# Patient Record
Sex: Male | Born: 1940 | Race: White | Hispanic: No | Marital: Married | Smoking: Former smoker
Health system: Southern US, Community
[De-identification: ages and names within clinical notes are randomized; demographics above are authoritative.]

## PROBLEM LIST (undated history)

## (undated) DIAGNOSIS — E039 Hypothyroidism, unspecified: Secondary | ICD-10-CM

## (undated) DIAGNOSIS — E785 Hyperlipidemia, unspecified: Secondary | ICD-10-CM

## (undated) DIAGNOSIS — J309 Allergic rhinitis, unspecified: Secondary | ICD-10-CM

## (undated) DIAGNOSIS — K08109 Complete loss of teeth, unspecified cause, unspecified class: Secondary | ICD-10-CM

## (undated) DIAGNOSIS — J45909 Unspecified asthma, uncomplicated: Secondary | ICD-10-CM

## (undated) DIAGNOSIS — M62838 Other muscle spasm: Secondary | ICD-10-CM

## (undated) DIAGNOSIS — M810 Age-related osteoporosis without current pathological fracture: Secondary | ICD-10-CM

## (undated) DIAGNOSIS — K5790 Diverticulosis of intestine, part unspecified, without perforation or abscess without bleeding: Secondary | ICD-10-CM

## (undated) DIAGNOSIS — Z972 Presence of dental prosthetic device (complete) (partial): Secondary | ICD-10-CM

## (undated) HISTORY — DX: Hyperlipidemia, unspecified: E78.5

## (undated) HISTORY — PX: PARATHYROIDECTOMY: SHX19

## (undated) HISTORY — PX: THYROID SURGERY: SHX805

## (undated) HISTORY — PX: COLONOSCOPY: SHX5424

---

## 2010-01-30 ENCOUNTER — Ambulatory Visit: Payer: Self-pay | Admitting: Unknown Physician Specialty

## 2010-01-31 LAB — PATHOLOGY REPORT

## 2011-03-26 ENCOUNTER — Ambulatory Visit: Payer: Self-pay | Admitting: Ophthalmology

## 2013-05-16 ENCOUNTER — Ambulatory Visit: Payer: Self-pay | Admitting: Unknown Physician Specialty

## 2013-05-17 LAB — PATHOLOGY REPORT

## 2013-11-14 ENCOUNTER — Ambulatory Visit: Payer: Self-pay | Admitting: Family Medicine

## 2014-05-30 DIAGNOSIS — M810 Age-related osteoporosis without current pathological fracture: Secondary | ICD-10-CM | POA: Diagnosis not present

## 2014-05-30 DIAGNOSIS — E039 Hypothyroidism, unspecified: Secondary | ICD-10-CM | POA: Diagnosis not present

## 2014-05-30 DIAGNOSIS — L219 Seborrheic dermatitis, unspecified: Secondary | ICD-10-CM | POA: Diagnosis not present

## 2014-05-30 DIAGNOSIS — J4520 Mild intermittent asthma, uncomplicated: Secondary | ICD-10-CM | POA: Diagnosis not present

## 2014-05-30 DIAGNOSIS — J302 Other seasonal allergic rhinitis: Secondary | ICD-10-CM | POA: Diagnosis not present

## 2014-05-30 DIAGNOSIS — Z23 Encounter for immunization: Secondary | ICD-10-CM | POA: Diagnosis not present

## 2014-06-01 DIAGNOSIS — M810 Age-related osteoporosis without current pathological fracture: Secondary | ICD-10-CM | POA: Diagnosis not present

## 2014-06-01 DIAGNOSIS — E039 Hypothyroidism, unspecified: Secondary | ICD-10-CM | POA: Diagnosis not present

## 2014-09-05 DIAGNOSIS — H2512 Age-related nuclear cataract, left eye: Secondary | ICD-10-CM | POA: Diagnosis not present

## 2014-09-11 DIAGNOSIS — H2512 Age-related nuclear cataract, left eye: Secondary | ICD-10-CM | POA: Diagnosis not present

## 2014-09-14 ENCOUNTER — Encounter: Payer: Self-pay | Admitting: *Deleted

## 2014-09-20 ENCOUNTER — Ambulatory Visit: Payer: Medicare Other | Admitting: Anesthesiology

## 2014-09-20 ENCOUNTER — Encounter
Admission: RE | Disposition: A | Discharge status: Home / Self Care | Payer: Self-pay | Source: Ambulatory Visit | Attending: Ophthalmology

## 2014-09-20 ENCOUNTER — Ambulatory Visit
Admission: RE | Admit: 2014-09-20 | Discharge: 2014-09-20 | Disposition: A | Discharge status: Home / Self Care | Payer: Medicare Other | Source: Ambulatory Visit | Attending: Ophthalmology | Admitting: Ophthalmology

## 2014-09-20 DIAGNOSIS — E890 Postprocedural hypothyroidism: Secondary | ICD-10-CM | POA: Diagnosis not present

## 2014-09-20 DIAGNOSIS — M810 Age-related osteoporosis without current pathological fracture: Secondary | ICD-10-CM | POA: Insufficient documentation

## 2014-09-20 DIAGNOSIS — J45909 Unspecified asthma, uncomplicated: Secondary | ICD-10-CM | POA: Diagnosis not present

## 2014-09-20 DIAGNOSIS — Z79899 Other long term (current) drug therapy: Secondary | ICD-10-CM | POA: Insufficient documentation

## 2014-09-20 DIAGNOSIS — H2512 Age-related nuclear cataract, left eye: Secondary | ICD-10-CM | POA: Insufficient documentation

## 2014-09-20 DIAGNOSIS — H9190 Unspecified hearing loss, unspecified ear: Secondary | ICD-10-CM | POA: Insufficient documentation

## 2014-09-20 DIAGNOSIS — H269 Unspecified cataract: Secondary | ICD-10-CM | POA: Diagnosis present

## 2014-09-20 DIAGNOSIS — F17200 Nicotine dependence, unspecified, uncomplicated: Secondary | ICD-10-CM | POA: Insufficient documentation

## 2014-09-20 DIAGNOSIS — Z9889 Other specified postprocedural states: Secondary | ICD-10-CM | POA: Insufficient documentation

## 2014-09-20 HISTORY — DX: Unspecified asthma, uncomplicated: J45.909

## 2014-09-20 HISTORY — DX: Allergic rhinitis, unspecified: J30.9

## 2014-09-20 HISTORY — DX: Complete loss of teeth, unspecified cause, unspecified class: K08.109

## 2014-09-20 HISTORY — PX: CATARACT EXTRACTION W/PHACO: SHX586

## 2014-09-20 HISTORY — DX: Diverticulosis of intestine, part unspecified, without perforation or abscess without bleeding: K57.90

## 2014-09-20 HISTORY — DX: Other muscle spasm: M62.838

## 2014-09-20 HISTORY — DX: Hypothyroidism, unspecified: E03.9

## 2014-09-20 HISTORY — DX: Presence of dental prosthetic device (complete) (partial): Z97.2

## 2014-09-20 HISTORY — DX: Age-related osteoporosis without current pathological fracture: M81.0

## 2014-09-20 SURGERY — PHACOEMULSIFICATION, CATARACT, WITH IOL INSERTION
Anesthesia: Monitor Anesthesia Care | Laterality: Left | Wound class: Clean

## 2014-09-20 MED ORDER — POVIDONE-IODINE 5 % OP SOLN
1.0000 "application " | Freq: Once | OPHTHALMIC | Status: AC
  Administered 2014-09-20: 1 via OPHTHALMIC

## 2014-09-20 MED ORDER — BSS IO SOLN
INTRAOCULAR | Status: DC | PRN
  Administered 2014-09-20: 15 mL
  Administered 2014-09-20: 103 mL

## 2014-09-20 MED ORDER — BRIMONIDINE TARTRATE 0.2 % OP SOLN
OPHTHALMIC | Status: DC | PRN
  Administered 2014-09-20: 3 [drp] via OPHTHALMIC

## 2014-09-20 MED ORDER — FENTANYL CITRATE (PF) 100 MCG/2ML IJ SOLN
INTRAMUSCULAR | Status: DC | PRN
  Administered 2014-09-20: 50 ug via INTRAVENOUS

## 2014-09-20 MED ORDER — LIDOCAINE HCL (PF) 4 % IJ SOLN: INTRAMUSCULAR | Status: DC | PRN

## 2014-09-20 MED ORDER — EPINEPHRINE HCL 1 MG/ML IJ SOLN
INTRAMUSCULAR | Status: DC | PRN
  Administered 2014-09-20: 1 mL

## 2014-09-20 MED ORDER — NA HYALUR & NA CHOND-NA HYALUR 0.4-0.35 ML IO KIT
PACK | INTRAOCULAR | Status: DC | PRN
  Administered 2014-09-20: 1 mL via INTRAOCULAR

## 2014-09-20 MED ORDER — MIDAZOLAM HCL 2 MG/2ML IJ SOLN
INTRAMUSCULAR | Status: DC | PRN
Start: 2014-09-20 — End: 2014-09-20
  Administered 2014-09-20: 2 mg via INTRAVENOUS

## 2014-09-20 MED ORDER — TETRACAINE HCL 0.5 % OP SOLN
1.0000 [drp] | OPHTHALMIC | Status: DC | PRN
  Administered 2014-09-20: 1 [drp] via OPHTHALMIC

## 2014-09-20 MED ORDER — CEFUROXIME OPHTHALMIC INJECTION 1 MG/0.1 ML
INJECTION | OPHTHALMIC | Status: DC | PRN
  Administered 2014-09-20: .3 mL via OPHTHALMIC

## 2014-09-20 MED ORDER — TIMOLOL MALEATE 0.5 % OP SOLN
OPHTHALMIC | Status: DC | PRN
  Administered 2014-09-20: 3 [drp] via OPHTHALMIC

## 2014-09-20 MED ORDER — ARMC OPHTHALMIC DILATING GEL
1.0000 "application " | OPHTHALMIC | Status: DC | PRN
  Administered 2014-09-20 (×2): 1 via OPHTHALMIC

## 2014-09-20 SURGICAL SUPPLY — 25 items
CANNULA ANT/CHMB 27GA (MISCELLANEOUS) ×2 IMPLANT
GLOVE SURG LX 7.5 STRW (GLOVE) ×1
GLOVE SURG LX STRL 7.5 STRW (GLOVE) ×1 IMPLANT
GLOVE SURG TRIUMPH 8.0 PF LTX (GLOVE) ×2 IMPLANT
GOWN STRL REUS W/ TWL LRG LVL3 (GOWN DISPOSABLE) ×2 IMPLANT
GOWN STRL REUS W/TWL LRG LVL3 (GOWN DISPOSABLE) ×2
LENS IOL ACRSF IQ PC 19.0 (Intraocular Lens) ×1 IMPLANT
LENS IOL ACRYSOF IQ POST 19.0 (Intraocular Lens) ×2 IMPLANT
MARKER SKIN SURG W/RULER VIO (MISCELLANEOUS) ×2 IMPLANT
NDL RETROBULBAR .5 NSTRL (NEEDLE) IMPLANT
NEEDLE FILTER BLUNT 18X 1/2SAF (NEEDLE) ×1
NEEDLE FILTER BLUNT 18X1 1/2 (NEEDLE) ×1 IMPLANT
PACK CATARACT BRASINGTON (MISCELLANEOUS) ×2 IMPLANT
PACK EYE AFTER SURG (MISCELLANEOUS) ×2 IMPLANT
PACK OPTHALMIC (MISCELLANEOUS) ×2 IMPLANT
RING MALYGIN 7.0 (MISCELLANEOUS) IMPLANT
SUT ETHILON 10-0 CS-B-6CS-B-6 (SUTURE)
SUT VICRYL  9 0 (SUTURE)
SUT VICRYL 9 0 (SUTURE) IMPLANT
SUTURE EHLN 10-0 CS-B-6CS-B-6 (SUTURE) IMPLANT
SYR 3ML LL SCALE MARK (SYRINGE) ×2 IMPLANT
SYR 5ML LL (SYRINGE) IMPLANT
SYR TB 1ML LUER SLIP (SYRINGE) ×2 IMPLANT
WATER STERILE IRR 500ML POUR (IV SOLUTION) ×2 IMPLANT
WIPE NON LINTING 3.25X3.25 (MISCELLANEOUS) ×2 IMPLANT

## 2014-09-20 NOTE — Anesthesia Preprocedure Evaluation (Signed)
Anesthesia Evaluation  Patient identified by MRN, date of birth, ID band Patient awake    Reviewed: Allergy & Precautions, H&P , Patient's Chart, lab work & pertinent test results  History of Anesthesia Complications Negative for: history of anesthetic complications  Airway Mallampati: II  TM Distance: >3 FB Neck ROM: full    Dental  (+) Edentulous Upper, Edentulous Lower   Pulmonary asthma , Current Smoker,    Pulmonary exam normal       Cardiovascular negative cardio ROS Normal cardiovascular exam    Neuro/Psych negative neurological ROS     GI/Hepatic negative GI ROS, Neg liver ROS,   Endo/Other  Hypothyroidism   Renal/GU negative Renal ROS  negative genitourinary   Musculoskeletal   Abdominal   Peds negative pediatric ROS (+)  Hematology negative hematology ROS (+)   Anesthesia Other Findings   Reproductive/Obstetrics                             Anesthesia Physical Anesthesia Plan  ASA: II  Anesthesia Plan: MAC   Post-op Pain Management:    Induction:   Airway Management Planned:   Additional Equipment:   Intra-op Plan:   Post-operative Plan:   Informed Consent: I have reviewed the patients History and Physical, chart, labs and discussed the procedure including the risks, benefits and alternatives for the proposed anesthesia with the patient or authorized representative who has indicated his/her understanding and acceptance.     Plan Discussed with: CRNA  Anesthesia Plan Comments:         Anesthesia Quick Evaluation

## 2014-09-20 NOTE — Discharge Instructions (Signed)
Follow-Up Appointment is: May 12 @ 9:40 am   Cataract Surgery Care After Refer to this sheet in the next few weeks. These instructions provide you with information on caring for yourself after your procedure. Your caregiver may also give you more specific instructions. Your treatment has been planned according to current medical practices, but problems sometimes occur. Call your caregiver if you have any problems or questions after your procedure.  HOME CARE INSTRUCTIONS   Avoid strenuous activities as directed by your caregiver.  Ask your caregiver when you can resume driving.  Use eyedrops or other medicines to help healing and control pressure inside your eye as directed by your caregiver.  Only take over-the-counter or prescription medicines for pain, discomfort, or fever as directed by your caregiver.  Do not to touch or rub your eyes.  You may be instructed to use a protective shield during the first few days and nights after surgery. If not, wear sunglasses to protect your eyes. This is to protect the eye from pressure or from being accidentally bumped.  Keep the area around your eye clean and dry. Avoid swimming or allowing water to hit you directly in the face while showering. Keep soap and shampoo out of your eyes.  Do not bend or lift heavy objects. Bending increases pressure in the eye. You can walk, climb stairs, and do light household chores.  Do not put a contact lens into the eye that had surgery until your caregiver says it is okay to do so.  Ask your doctor when you can return to work. This will depend on the kind of work that you do. If you work in a dusty environment, you may be advised to wear protective eyewear for a period of time.  Ask your caregiver when it will be safe to engage in sexual activity.  Continue with your regular eye exams as directed by your caregiver. What to expect:  It is normal to feel itching and mild discomfort for a few days after  cataract surgery. Some fluid discharge is also common, and your eye may be sensitive to light and touch.  After 1 to 2 days, even moderate discomfort should disappear. In most cases, healing will take about 6 weeks.  If you received an intraocular lens (IOL), you may notice that colors are very bright or have a blue tinge. Also, if you have been in bright sunlight, everything may appear reddish for a few hours. If you see these color tinges, it is because your lens is clear and no longer cloudy. Within a few months after receiving an IOL, these extra colors should go away. When you have healed, you will probably need new glasses. SEEK MEDICAL CARE IF:   You have increased bruising around your eye.  You have discomfort not helped by medicine. SEEK IMMEDIATE MEDICAL CARE IF:   You have a fever.  You have a worsening or sudden vision loss.  You have redness, swelling, or increasing pain in the eye.  You have a thick discharge from the eye that had surgery. MAKE SURE YOU:  Understand these instructions.  Will watch your condition.  Will get help right away if you are not doing well or get worse. Document Released: 11/15/2004 Document Revised: 07/21/2011 Document Reviewed: 12/20/2010 [facility] Patient Information 2015 [place], [place]. This information is not intended to replace advice given to you by your health care provider. Make sure you discuss any questions you have with your health care provider.   General  Anesthesia, Care After Refer to this sheet in the next few weeks. These instructions provide you with information on caring for yourself after your procedure. Your health care provider may also give you more specific instructions. Your treatment has been planned according to current medical practices, but problems sometimes occur. Call your health care provider if you have any problems or questions after your procedure. WHAT TO EXPECT AFTER THE PROCEDURE After the procedure, it is  typical to experience:  Sleepiness.  Nausea and vomiting. HOME CARE INSTRUCTIONS  For the first 24 hours after general anesthesia:  Have a responsible person with you.  Do not drive a car. If you are alone, do not take public transportation.  Do not drink alcohol.  Do not take medicine that has not been prescribed by your health care provider.  Do not sign important papers or make important decisions.  You may resume a normal diet and activities as directed by your health care provider.  Change bandages (dressings) as directed.  If you have questions or problems that seem related to general anesthesia, call the hospital and ask for the anesthetist or anesthesiologist on call. SEEK MEDICAL CARE IF:  You have nausea and vomiting that continue the day after anesthesia.  You develop a rash. SEEK IMMEDIATE MEDICAL CARE IF:   You have difficulty breathing.  You have chest pain.  You have any allergic problems. Document Released: 08/04/2000 Document Revised: 05/03/2013 Document Reviewed: 11/11/2012 [facility] Patient Information 2015 [place], [place]. This information is not intended to replace advice given to you by your health care provider. Make sure you discuss any questions you have with your health care provider.

## 2014-09-20 NOTE — H&P (Signed)
  The History and Physical notes were scanned in.  The patient remains stable and unchanged from the H&P.   Previous H&P reviewed, patient examined, and there are no changes.  Burdett Pinzon 09/20/2014 9:08 AM

## 2014-09-20 NOTE — Transfer of Care (Signed)
Immediate Anesthesia Transfer of Care Note  Patient: Blake Bates  Procedure(s) Performed: Procedure(s): CATARACT EXTRACTION PHACO AND INTRAOCULAR LENS PLACEMENT (IOC) (Left)  Patient Location: PACU  Anesthesia Type: MAC  Level of Consciousness: awake, alert  and patient cooperative  Airway and Oxygen Therapy: Patient Spontanous Breathing and Patient connected to supplemental oxygen  Post-op Assessment: Post-op Vital signs reviewed, Patient's Cardiovascular Status Stable, Respiratory Function Stable, Patent Airway and No signs of Nausea or vomiting  Post-op Vital Signs: Reviewed and stable  Complications: No apparent anesthesia complications

## 2014-09-20 NOTE — Anesthesia Postprocedure Evaluation (Signed)
  Anesthesia Post-op Note  Patient: Blake Bates  Procedure(s) Performed: Procedure(s): CATARACT EXTRACTION PHACO AND INTRAOCULAR LENS PLACEMENT (IOC) (Left)  Anesthesia type:MAC  Patient location: PACU  Post pain: Pain level controlled  Post assessment: Post-op Vital signs reviewed, Patient's Cardiovascular Status Stable, Respiratory Function Stable, Patent Airway and No signs of Nausea or vomiting  Post vital signs: Reviewed and stable  Last Vitals:  Filed Vitals:   09/20/14 0943  BP: 142/73  Pulse: 65  Temp: 36.3 C  Resp: 14    Level of consciousness: awake, alert  and patient cooperative  Complications: No apparent anesthesia complications

## 2014-09-20 NOTE — Op Note (Signed)
OPERATIVE NOTE  CLESTER CHLEBOWSKI [id number] 09/20/2014   PREOPERATIVE DIAGNOSIS:     Nuclear sclerotic cataract left eye. H25.12   POSTOPERATIVE DIAGNOSIS:     Nuclear sclerotic cataract left eye.     PROCEDURE:  Phacoemusification with posterior chamber intraocular lens placement of the left eye   LENS:   Implant Name Type Inv. Item Serial No. Manufacturer Lot No. LRB No. Used  IMPLANT LENS - [id number] Intraocular Lens IMPLANT LENS [id number] ALCON   Left 1     [id number] 20.0 PCIOL   ULTRASOUND TIME: 16 of 1 minutes 45 seconds, CDE 16.6  SURGEON:  Wyonia Hough, MD   ANESTHESIA:  Topical with tetracaine drops and 2% Xylocaine jelly.   COMPLICATIONS:  None.   DESCRIPTION OF PROCEDURE:  The patient was identified in the holding room and transported to the operating room and placed in the supine position under the operating microscope.  The left eye was identified as the operative eye and it was prepped and draped in the usual sterile ophthalmic fashion.   A 1 millimeter clear-corneal paracentesis was made at the 1:30 position.  The anterior chamber was filled with Viscoat viscoelastic.  A 2.4 millimeter keratome was used to make a near-clear corneal incision at the 10:30 position.  .  A curvilinear capsulorrhexis was made with a cystotome and capsulorrhexis forceps.  Balanced salt solution was used to hydrodissect and hydrodelineate the nucleus.   Phacoemulsification was then used in stop and chop fashion to remove the lens nucleus and epinucleus.  The remaining cortex was then removed using the irrigation and aspiration handpiece. Provisc was then placed into the capsular bag to distend it for lens placement.  A 20.0 -diopter lens was then injected into the capsular bag.  The remaining viscoelastic was aspirated.   Wounds were hydrated with balanced salt solution.  The anterior chamber was inflated to a physiologic pressure with balanced salt solution.  No wound leaks were  noted. Cefuroxime 0.1 ml of a 10mg /ml solution was injected into the anterior chamber for a dose of 1 mg of intracameral antibiotic at the completion of the case.   Timolol and Brimonidine drops were applied to the eye.  The patient was taken to the recovery room in stable condition without complications of anesthesia or surgery.  Jamarrius Salay 09/20/2014, 9:43 AM

## 2014-09-21 ENCOUNTER — Encounter: Payer: Self-pay | Admitting: Ophthalmology

## 2014-10-30 DIAGNOSIS — Z961 Presence of intraocular lens: Secondary | ICD-10-CM | POA: Diagnosis not present

## 2014-11-03 ENCOUNTER — Encounter: Payer: Self-pay | Admitting: Family Medicine

## 2014-11-03 ENCOUNTER — Ambulatory Visit (INDEPENDENT_AMBULATORY_CARE_PROVIDER_SITE_OTHER): Payer: Medicare Other | Admitting: Family Medicine

## 2014-11-03 VITALS — BP 135/70 | HR 68 | Temp 97.6°F | Resp 16 | Ht 70.0 in | Wt 151.8 lb

## 2014-11-03 DIAGNOSIS — L989 Disorder of the skin and subcutaneous tissue, unspecified: Secondary | ICD-10-CM

## 2014-11-03 DIAGNOSIS — J4520 Mild intermittent asthma, uncomplicated: Secondary | ICD-10-CM

## 2014-11-03 DIAGNOSIS — E890 Postprocedural hypothyroidism: Secondary | ICD-10-CM | POA: Diagnosis not present

## 2014-11-03 DIAGNOSIS — Z0000 Encounter for general adult medical examination without abnormal findings: Secondary | ICD-10-CM | POA: Diagnosis not present

## 2014-11-03 DIAGNOSIS — J45909 Unspecified asthma, uncomplicated: Secondary | ICD-10-CM | POA: Insufficient documentation

## 2014-11-03 DIAGNOSIS — M810 Age-related osteoporosis without current pathological fracture: Secondary | ICD-10-CM | POA: Insufficient documentation

## 2014-11-03 DIAGNOSIS — IMO0002 Reserved for concepts with insufficient information to code with codable children: Secondary | ICD-10-CM

## 2014-11-03 DIAGNOSIS — M6530 Trigger finger, unspecified finger: Secondary | ICD-10-CM

## 2014-11-03 DIAGNOSIS — E039 Hypothyroidism, unspecified: Secondary | ICD-10-CM | POA: Insufficient documentation

## 2014-11-03 MED ORDER — LEVOTHYROXINE SODIUM 100 MCG PO TABS: 100.0000 ug | ORAL_TABLET | Freq: Every day | ORAL | Status: DC

## 2014-11-03 NOTE — Progress Notes (Signed)
Name: Blake Bates   MRN: [id number]    DOB: April 14, 1941   Date:11/03/2014       Progress Note  Subjective  Chief Complaint  Chief Complaint  Patient presents with  . Annual Exam    HPI  Here for annual physical for health maintenance. Hx. Of hypothyhoid, asthma, osteoporsis.  Feels well.  No c/o  Past Medical History  Diagnosis Date  . Asthma   . Osteoporosis   . Hypothyroidism     s/p subtotal thyroidectomy  . Rhinitis, allergic     pollens, mold, animal dander  . Muscle spasms of lower extremity     Right upper leg  . Diverticulosis   . Full dentures     Past Surgical History  Procedure Laterality Date  . Thyroid surgery      subtotal  . Parathyroidectomy    . Colonoscopy    . Cataract extraction w/phaco Left 09/20/2014    Procedure: CATARACT EXTRACTION PHACO AND INTRAOCULAR LENS PLACEMENT (IOC);  Surgeon: Leandrew Koyanagi, MD;  Location: [facility];  Service: Ophthalmology;  Laterality: Left;    History reviewed. No pertinent family history.  History   Social History  . Marital Status: Married    Spouse Name: N/A  . Number of Children: N/A  . Years of Education: N/A   Occupational History  . Not on file.   Social History Main Topics  . Smoking status: Current Every Day Smoker -- 3.00 packs/day    Types: Pipe, Cigarettes  . Smokeless tobacco: Never Used  . Alcohol Use: 1.2 oz/week    2 Glasses of wine per week  . Drug Use: No  . Sexual Activity: Not on file   Other Topics Concern  . Not on file   Social History Narrative     Current outpatient prescriptions:  .  albuterol (PROVENTIL HFA;VENTOLIN HFA) 108 (90 BASE) MCG/ACT inhaler, Inhale into the lungs every 6 (six) hours as needed for wheezing or shortness of breath., Disp: , Rfl:  .  Calcium Carbonate-Vitamin D (CALCIUM + D PO), Take by mouth daily. PM, Disp: , Rfl:  .  Cholecalciferol (VITAMIN D-3 PO), Take by mouth daily. PM, Disp: , Rfl:  .  levothyroxine (SYNTHROID,  LEVOTHROID) 100 MCG tablet, Take 1 tablet (100 mcg total) by mouth daily before breakfast., Disp: 90 tablet, Rfl: 3 .  loratadine (CLARITIN) 10 MG tablet, Take 10 mg by mouth daily as needed for allergies. PM, Disp: , Rfl:   Allergies  Allergen Reactions  . Demerol [Meperidine] Other (See Comments)    Combative, altered mental status     Review of Systems  Constitutional: Negative.  Negative for fever, chills, weight loss and malaise/fatigue.  HENT: Negative.  Negative for congestion.   Eyes: Negative.  Negative for blurred vision and double vision.  Respiratory: Negative.  Negative for cough, sputum production, shortness of breath and wheezing.   Cardiovascular: Negative.  Negative for chest pain, palpitations, orthopnea, claudication and leg swelling.  Gastrointestinal: Negative.  Negative for heartburn, nausea, vomiting, abdominal pain, diarrhea and blood in stool.  Genitourinary: Negative.  Negative for dysuria, urgency and frequency.  Musculoskeletal: Negative for joint pain. Myalgias: r. HIP.  Skin: Positive for rash (seborrhea).  Neurological: Negative.  Negative for dizziness, sensory change, focal weakness, weakness and headaches.  Psychiatric/Behavioral: Negative.  Negative for depression. The patient is not nervous/anxious.       Objective  Filed Vitals:   11/03/14 1040 11/03/14 1118  BP: 145/80 135/70  Pulse:  68   Temp: 97.6 F (36.4 C)   Resp: 16   Height: 5\' 10"  (1.778 m)   Weight: 151 lb 12.8 oz (68.856 kg)     Physical Exam  Constitutional: He is oriented to person, place, and time and well-developed, well-nourished, and in no distress. No distress.  HENT:  Head: Normocephalic and atraumatic.  Right Ear: External ear normal.  Left Ear: External ear normal.  Nose: Nose normal.  Mouth/Throat: Oropharynx is clear and moist.  Wears bilateral hearing aids  Eyes: Conjunctivae and EOM are normal. Pupils are equal, round, and reactive to light. Right eye  exhibits no discharge. Left eye exhibits no discharge. No scleral icterus.  Neck: Normal range of motion. Neck supple. No thyromegaly present.  Cardiovascular: Normal rate, regular rhythm, normal heart sounds and intact distal pulses.  Exam reveals no gallop and no friction rub.   No murmur heard. Pulmonary/Chest: Effort normal and breath sounds normal. No respiratory distress. He has no wheezes. He has no rales. He exhibits no tenderness.  Abdominal: Bowel sounds are normal. He exhibits no distension and no mass. There is no tenderness. There is no rebound and no guarding.  Genitourinary: Penis normal.  Testes normal bilaterally.  Neg. Inguinal hernias.  Musculoskeletal:  Has R. 4th finger trigger finger and L. Thumb trigger finger.  Lymphadenopathy:    He has no cervical adenopathy.  Neurological: He is alert and oriented to person, place, and time. No cranial nerve deficit.  Skin: Skin is warm and dry.  Irregular skin lesion mid chest.  Ow[place] Shark, not raised.  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  Psychiatric: Mood, affect and judgment normal.          Assessment & Plan  Problem List Items Addressed This Visit      Respiratory   Asthma   Relevant Orders   CBC with Differential     Endocrine   Hypothyroidism   Relevant Medications   levothyroxine (SYNTHROID, LEVOTHROID) 100 MCG tablet   Other Relevant Orders   TSH     Musculoskeletal and Integument   Osteoporosis   Relevant Orders   Comprehensive Metabolic Panel (CMET)   Skin lesion of chest wall   Relevant Orders   Ambulatory referral to Dermatology   Trigger finger of both hands   Relevant Orders   Ambulatory referral to Orthopedic Surgery    Other Visit Diagnoses    Preventative health care    -  Primary    Relevant Orders    PR TOBACCO COUNSELING      1. Preventative health care  - PR TOBACCO COUNSELING  2. Postoperative hypothyroidism  - TSH - levothyroxine (SYNTHROID, LEVOTHROID) 100 MCG  tablet; Take 1 tablet (100 mcg total) by mouth daily before breakfast.  Dispense: 90 tablet; Refill: 3  3. Asthma, mild intermittent, uncomplicated  - CBC with Differential  4. Osteoporosis  - Comprehensive Metabolic Panel (CMET)  5. Skin lesion of chest wall  - Ambulatory referral to Dermatology  6.Trigger finger   - Ambulatory referral to Orthopedic Surgery

## 2014-11-15 ENCOUNTER — Telehealth: Payer: Self-pay

## 2014-11-15 NOTE — Telephone Encounter (Signed)
12/18/2014 [place] 11:45 w Hardie Pulley Patient notified. [facility]

## 2014-11-21 DIAGNOSIS — M65341 Trigger finger, right ring finger: Secondary | ICD-10-CM | POA: Diagnosis not present

## 2014-11-21 DIAGNOSIS — M65312 Trigger thumb, left thumb: Secondary | ICD-10-CM | POA: Diagnosis not present

## 2014-12-18 DIAGNOSIS — L218 Other seborrheic dermatitis: Secondary | ICD-10-CM | POA: Diagnosis not present

## 2014-12-18 DIAGNOSIS — L821 Other seborrheic keratosis: Secondary | ICD-10-CM | POA: Diagnosis not present

## 2015-05-29 ENCOUNTER — Encounter: Payer: Self-pay | Admitting: Family Medicine

## 2015-05-29 ENCOUNTER — Ambulatory Visit (INDEPENDENT_AMBULATORY_CARE_PROVIDER_SITE_OTHER): Payer: Medicare Other | Admitting: Family Medicine

## 2015-05-29 VITALS — BP 129/78 | HR 66 | Resp 16 | Ht 70.0 in | Wt 149.0 lb

## 2015-05-29 DIAGNOSIS — Z23 Encounter for immunization: Secondary | ICD-10-CM | POA: Diagnosis not present

## 2015-05-29 NOTE — Patient Instructions (Signed)
Health Maintenance  Topic Date Due  . INFLUENZA VACCINE  12/11/2015  . TETANUS/TDAP  10/13/2023  . COLONOSCOPY  11/10/2023  . ZOSTAVAX  Completed  . PNA vac Low Risk Adult  Completed

## 2015-05-29 NOTE — Progress Notes (Signed)
Patient: Blake Bates, Male    DOB: 22-Apr-1941, 75 y.o.   MRN: [id number] Visit Date: 05/29/2015  Today's Provider: Dicky Doe, MD   Chief Complaint  Patient presents with  . Medicare Wellness    Subjective:    Annual wellness visit Blake Bates is a 75 y.o. male who presents today for his Subsequent Annual Wellness Visit. He feels well. He reports exercising . He reports he is sleeping well.   ----------------------------------------------------------- HPI  Review of Systems  Social History   Social History  . Marital Status: Married    Spouse Name: N/A  . Number of Children: N/A  . Years of Education: N/A   Occupational History  . Not on file.   Social History Main Topics  . Smoking status: Current Every Day Smoker -- 0.00 packs/day    Types: Pipe  . Smokeless tobacco: Never Used  . Alcohol Use: 1.2 oz/week    2 Glasses of wine per week  . Drug Use: No  . Sexual Activity: Not on file   Other Topics Concern  . Not on file   Social History Narrative    Patient Active Problem List   Diagnosis Date Noted  . Hypothyroidism 11/03/2014  . Asthma 11/03/2014  . Osteoporosis 11/03/2014  . Skin lesion of chest wall 11/03/2014  . Trigger finger of both hands 11/03/2014    Past Surgical History  Procedure Laterality Date  . Thyroid surgery      subtotal  . Parathyroidectomy    . Colonoscopy    . Cataract extraction w/phaco Left 09/20/2014    Procedure: CATARACT EXTRACTION PHACO AND INTRAOCULAR LENS PLACEMENT (IOC);  Surgeon: Leandrew Koyanagi, MD;  Location: [facility];  Service: Ophthalmology;  Laterality: Left;    His family history includes Cancer (age of onset: 7) in his father; Stroke (age of onset: 61) in his mother.    Previous Medications   ALBUTEROL (PROVENTIL HFA;VENTOLIN HFA) 108 (90 BASE) MCG/ACT INHALER    Inhale into the lungs every 6 (six) hours as needed for wheezing or shortness of breath.   CALCIUM  CARBONATE-VITAMIN D (CALCIUM + D PO)    Take by mouth daily. PM   CHOLECALCIFEROL (VITAMIN D-3 PO)    Take by mouth daily. PM   LEVOTHYROXINE (SYNTHROID, LEVOTHROID) 100 MCG TABLET    Take 1 tablet (100 mcg total) by mouth daily before breakfast.   LORATADINE (CLARITIN) 10 MG TABLET    Take 10 mg by mouth daily as needed for allergies. PM    Patient Care Team: Arlis Porta., MD as PCP - General (Family Medicine)     Objective:   Vitals: BP 129/78 mmHg  Pulse 66  Resp 16  Ht 5\' 10"  (1.778 m)  Wt 149 lb (67.586 kg)  BMI 21.38 kg/m2  Physical Exam  Activities of Daily Living In your present state of health, do you have any difficulty performing the following activities: 05/29/2015 11/03/2014  Hearing? N Y  Vision? N Y  Difficulty concentrating or making decisions? N N  Walking or climbing stairs? N N  Dressing or bathing? N N  Doing errands, shopping? N N  Preparing Food and eating ? N -  Using the Toilet? N -  In the past six months, have you accidently leaked urine? N -  Do you have problems with loss of bowel control? N -  Managing your Medications? N -  Managing your Finances? N -  Housekeeping or managing your Housekeeping?  N -    Fall Risk Assessment Fall Risk  05/29/2015 11/03/2014  Falls in the past year? No No     Patient reports there are not safety devices in place in shower at home.   Depression Screen PHQ 2/9 Scores 05/29/2015 11/03/2014  PHQ - 2 Score 0 0     MMSE MMSE - Mini Mental State Exam 05/29/2015  Orientation to time 5  Orientation to Place 5  Registration 3  Attention/ Calculation 5  Recall 3  Language- name 2 objects 2  Language- repeat 1  Language- follow 3 step command 3  Language- read & follow direction 1  Write a sentence 1  Copy design 1  Total score 30     Assessment & Plan:     Annual Wellness Visit  Reviewed patient's Family Medical History Reviewed and updated list of patient's medical providers Assessment of  cognitive impairment was done Assessed patient's functional ability Established a written schedule for health screening [place] Completed and Reviewed  Exercise Activities and Dietary recommendations Goals    . Exercise 3x per week (30 min per time)     Trouble finding place to walk.        Immunization History  Administered Date(s) Administered  . Influenza, High Dose Seasonal PF 05/29/2015  . Pneumococcal Conjugate-13 05/30/2014  . Pneumococcal Polysaccharide-23 04/30/2012  . Tdap 10/12/2013  . Zoster 04/11/2012    Health Maintenance  Topic Date Due  . INFLUENZA VACCINE  12/11/2015  . TETANUS/TDAP  10/13/2023  . COLONOSCOPY  11/10/2023  . ZOSTAVAX  Completed  . PNA vac Low Risk Adult  Completed     Discussed health benefits of physical activity, and encouraged him to engage in regular exercise appropriate for his age and condition.    ------------------------------------------------------------------------------------------------------------   Problem List Items Addressed This Visit    None    Visit Diagnoses    Need for influenza vaccination    -  Primary    Relevant Orders    Flu vaccine HIGH DOSE PF (Fluzone High dose) (Completed)        Larene Beach, MD [facility]  05/29/2015

## 2015-07-16 ENCOUNTER — Other Ambulatory Visit: Payer: Self-pay | Admitting: Family Medicine

## 2015-11-20 ENCOUNTER — Encounter: Payer: Self-pay | Admitting: Family Medicine

## 2015-11-20 ENCOUNTER — Ambulatory Visit (INDEPENDENT_AMBULATORY_CARE_PROVIDER_SITE_OTHER): Payer: Medicare Other | Admitting: Family Medicine

## 2015-11-20 VITALS — BP 119/76 | HR 58 | Temp 97.8°F | Resp 19 | Ht 70.0 in

## 2015-11-20 DIAGNOSIS — Z0000 Encounter for general adult medical examination without abnormal findings: Secondary | ICD-10-CM | POA: Diagnosis not present

## 2015-11-20 DIAGNOSIS — M810 Age-related osteoporosis without current pathological fracture: Secondary | ICD-10-CM

## 2015-11-20 DIAGNOSIS — E038 Other specified hypothyroidism: Secondary | ICD-10-CM | POA: Diagnosis not present

## 2015-11-20 DIAGNOSIS — E034 Atrophy of thyroid (acquired): Secondary | ICD-10-CM

## 2015-11-20 DIAGNOSIS — J4520 Mild intermittent asthma, uncomplicated: Secondary | ICD-10-CM

## 2015-11-20 LAB — CBC WITH DIFFERENTIAL/PLATELET
Basophils Absolute: 78 cells/uL (ref 0–200)
Basophils Relative: 1 %
Eosinophils Absolute: 234 cells/uL (ref 15–500)
Eosinophils Relative: 3 %
HCT: 45.1 % (ref 38.5–50.0)
Hemoglobin: 14.9 g/dL (ref 13.2–17.1)
Lymphocytes Relative: 19 %
Lymphs Abs: 1482 cells/uL (ref 850–3900)
MCH: 31 pg (ref 27.0–33.0)
MCHC: 33 g/dL (ref 32.0–36.0)
MCV: 93.8 fL (ref 80.0–100.0)
MPV: 9.9 fL (ref 7.5–12.5)
Monocytes Absolute: 858 cells/uL (ref 200–950)
Monocytes Relative: 11 %
Neutro Abs: 5148 cells/uL (ref 1500–7800)
Neutrophils Relative %: 66 %
Platelets: 277 10*3/uL (ref 140–400)
RBC: 4.81 MIL/uL (ref 4.20–5.80)
RDW: 14.1 % (ref 11.0–15.0)
WBC: 7.8 10*3/uL (ref 3.8–10.8)

## 2015-11-20 NOTE — Progress Notes (Signed)
Name: Blake Bates   MRN: [id number]    DOB: 09-15-1940   Date:11/20/2015       Progress Note  Subjective  Chief Complaint  Chief Complaint  Patient presents with  . Annual Exam    HPI Here for annual physical.  Has hypothyroidism.  Also with some asthma, but not bothered much with it.  Has some osteoporosis and takes Vit D and Calcium  No problem-specific assessment & plan notes found for this encounter.   Past Medical History  Diagnosis Date  . Asthma   . Osteoporosis   . Hypothyroidism     s/p subtotal thyroidectomy  . Rhinitis, allergic     pollens, mold, animal dander  . Muscle spasms of lower extremity     Right upper leg  . Diverticulosis   . Full dentures     Past Surgical History  Procedure Laterality Date  . Thyroid surgery      subtotal  . Parathyroidectomy    . Colonoscopy    . Cataract extraction w/phaco Left 09/20/2014    Procedure: CATARACT EXTRACTION PHACO AND INTRAOCULAR LENS PLACEMENT (IOC);  Surgeon: Leandrew Koyanagi, MD;  Location: [facility];  Service: Ophthalmology;  Laterality: Left;    Family History  Problem Relation Age of Onset  . Stroke Mother 42  . Cancer Father 2    Social History   Social History  . Marital Status: Married    Spouse Name: N/A  . Number of Children: N/A  . Years of Education: N/A   Occupational History  . Not on file.   Social History Main Topics  . Smoking status: Current Every Day Smoker -- 0.00 packs/day    Types: Pipe  . Smokeless tobacco: Never Used  . Alcohol Use: 1.2 oz/week    2 Glasses of wine per week  . Drug Use: No  . Sexual Activity: Not on file   Other Topics Concern  . Not on file   Social History Narrative     Current outpatient prescriptions:  .  albuterol (PROVENTIL HFA;VENTOLIN HFA) 108 (90 BASE) MCG/ACT inhaler, Inhale into the lungs every 6 (six) hours as needed for wheezing or shortness of breath., Disp: , Rfl:  .  Calcium Carbonate-Vitamin D (CALCIUM + D  PO), Take by mouth daily. PM, Disp: , Rfl:  .  Cholecalciferol (VITAMIN D-3 PO), Take by mouth daily. PM, Disp: , Rfl:  .  levothyroxine (SYNTHROID, LEVOTHROID) 100 MCG tablet, TAKE 1 TABLET BY MOUTH DAILY, Disp: 90 tablet, Rfl: 3 .  loratadine (CLARITIN) 10 MG tablet, Take 10 mg by mouth daily as needed for allergies. PM, Disp: , Rfl:   Allergies  Allergen Reactions  . Demerol [Meperidine] Other (See Comments)    Combative, altered mental status     Review of Systems  Constitutional: Negative for fever, chills, weight loss and malaise/fatigue.  HENT: Negative for hearing loss.   Eyes: Negative for blurred vision and double vision.  Respiratory: Negative for cough, shortness of breath and wheezing.   Cardiovascular: Negative for chest pain, palpitations and leg swelling.  Gastrointestinal: Negative for heartburn, abdominal pain and blood in stool.  Genitourinary: Negative for dysuria, urgency and frequency.  Musculoskeletal: Positive for joint pain. Negative for myalgias and back pain (hips stiff in AM.).  Skin: Negative for rash.  Neurological: Negative for dizziness, tremors, weakness and headaches.  Psychiatric/Behavioral: Negative for depression. The patient is not nervous/anxious and does not have insomnia.       Objective  Filed Vitals:   11/20/15 0910  BP: 119/76  Pulse: 58  Temp: 97.8 F (36.6 C)  TempSrc: Oral  Resp: 19  Height: 5\' 10"  (1.778 m)    Physical Exam  Constitutional: He is oriented to person, place, and time and well-developed, well-nourished, and in no distress. No distress.  HENT:  Head: Normocephalic and atraumatic.  Right Ear: External ear normal.  Left Ear: External ear normal.  Nose: Nose normal.  Mouth/Throat: Oropharynx is clear and moist.  Eyes: Conjunctivae and EOM are normal. Pupils are equal, round, and reactive to light. No scleral icterus.  Fundoscopic exam:      The right eye shows no arteriolar narrowing, no AV nicking, no  exudate, no hemorrhage and no papilledema.       The left eye shows no arteriolar narrowing, no AV nicking, no exudate, no hemorrhage and no papilledema.  Neck: Normal range of motion. Neck supple. Carotid bruit is not present. No thyromegaly present.  Cardiovascular: Normal rate, regular rhythm and normal heart sounds.  Exam reveals no gallop and no friction rub.   No murmur heard. Pulmonary/Chest: Effort normal and breath sounds normal. No respiratory distress. He has no wheezes. He exhibits no tenderness.  Abdominal: Soft. Bowel sounds are normal. He exhibits no distension and no mass. There is no tenderness.  Genitourinary: Penis normal. He exhibits no abnormal testicular mass, no testicular tenderness, no abnormal scrotal mass, no scrotal tenderness and no epididymal tenderness. Penis exhibits no lesions. No discharge found.  Musculoskeletal: Normal range of motion. He exhibits no edema.  Lymphadenopathy:    He has no cervical adenopathy.  Neurological: He is alert and oriented to person, place, and time.  Skin:  Some patches of Psoriasis on lower legs and arms  Vitals reviewed.      No results found for this or any previous visit (from the past 2160 hour(s)).   Assessment & Plan  Problem List Items Addressed This Visit      Respiratory   Asthma   Relevant Orders   CBC with Differential     Endocrine   Hypothyroidism   Relevant Orders   TSH     Musculoskeletal and Integument   Osteoporosis   Relevant Orders   Lipid Profile    Other Visit Diagnoses    Health maintenance examination    -  Primary    Relevant Orders    COMPLETE METABOLIC PANEL WITH GFR       No orders of the defined types were placed in this encounter.   1. Health maintenance examination  - COMPLETE METABOLIC PANEL WITH GFR  2. Hypothyroidism due to acquired atrophy of thyroid Cont. Levothyroxine - TSH  3. Asthma, mild intermittent, uncomplicated Use Albuterol as needed. - CBC with  Differential  4. Osteoporosis Cont calcium and Vit D. - Lipid Profile

## 2015-11-21 LAB — COMPLETE METABOLIC PANEL WITH GFR
ALT: 10 U/L (ref 9–46)
AST: 16 U/L (ref 10–35)
Albumin: 4.1 g/dL (ref 3.6–5.1)
Alkaline Phosphatase: 57 U/L (ref 40–115)
BUN: 14 mg/dL (ref 7–25)
CO2: 27 mmol/L (ref 20–31)
Calcium: 9.1 mg/dL (ref 8.6–10.3)
Chloride: 101 mmol/L (ref 98–110)
Creat: 1.13 mg/dL (ref 0.70–1.18)
GFR, Est African American: 73 mL/min (ref >=60)
GFR, Est Non African American: 63 mL/min (ref >=60)
Glucose, Bld: 99 mg/dL (ref 65–99)
Potassium: 4.5 mmol/L (ref 3.5–5.3)
Sodium: 140 mmol/L (ref 135–146)
Total Bilirubin: 0.8 mg/dL (ref 0.2–1.2)
Total Protein: 6.7 g/dL (ref 6.1–8.1)

## 2015-11-21 LAB — LIPID PANEL
Cholesterol: 216 mg/dL — ABNORMAL HIGH (ref 125–200)
HDL: 94 mg/dL (ref >=40)
LDL Cholesterol: 111 mg/dL (ref <130)
Total CHOL/HDL Ratio: 2.3 Ratio (ref <=5.0)
Triglycerides: 53 mg/dL (ref <150)
VLDL: 11 mg/dL (ref <30)

## 2015-11-21 LAB — TSH: TSH: 0.2 mIU/L — ABNORMAL LOW (ref 0.40–4.50)

## 2015-11-23 ENCOUNTER — Other Ambulatory Visit: Payer: Self-pay | Admitting: Family Medicine

## 2015-11-23 DIAGNOSIS — E034 Atrophy of thyroid (acquired): Secondary | ICD-10-CM

## 2015-12-14 ENCOUNTER — Other Ambulatory Visit: Payer: Self-pay

## 2015-12-14 DIAGNOSIS — E034 Atrophy of thyroid (acquired): Secondary | ICD-10-CM

## 2015-12-17 ENCOUNTER — Other Ambulatory Visit: Payer: Medicare Other

## 2015-12-17 DIAGNOSIS — E038 Other specified hypothyroidism: Secondary | ICD-10-CM | POA: Diagnosis not present

## 2015-12-17 DIAGNOSIS — E034 Atrophy of thyroid (acquired): Secondary | ICD-10-CM | POA: Diagnosis not present

## 2015-12-18 ENCOUNTER — Other Ambulatory Visit: Payer: Self-pay | Admitting: *Deleted

## 2015-12-18 DIAGNOSIS — L218 Other seborrheic dermatitis: Secondary | ICD-10-CM | POA: Diagnosis not present

## 2015-12-18 DIAGNOSIS — Z1283 Encounter for screening for malignant neoplasm of skin: Secondary | ICD-10-CM | POA: Diagnosis not present

## 2015-12-18 DIAGNOSIS — L570 Actinic keratosis: Secondary | ICD-10-CM | POA: Diagnosis not present

## 2015-12-18 LAB — TSH: TSH: 2.77 mIU/L (ref 0.40–4.50)

## 2015-12-18 MED ORDER — LEVOTHYROXINE SODIUM 50 MCG PO TABS: 50.0000 ug | ORAL_TABLET | Freq: Every day | ORAL | 2 refills | Status: DC

## 2016-06-20 ENCOUNTER — Encounter: Payer: Self-pay | Admitting: Family Medicine

## 2016-06-20 ENCOUNTER — Ambulatory Visit (INDEPENDENT_AMBULATORY_CARE_PROVIDER_SITE_OTHER): Payer: Medicare Other | Admitting: Family Medicine

## 2016-06-20 VITALS — BP 128/66 | HR 60 | Temp 97.5°F | Resp 16 | Ht 70.0 in | Wt 150.6 lb

## 2016-06-20 DIAGNOSIS — M810 Age-related osteoporosis without current pathological fracture: Secondary | ICD-10-CM

## 2016-06-20 DIAGNOSIS — Z0000 Encounter for general adult medical examination without abnormal findings: Secondary | ICD-10-CM

## 2016-06-20 DIAGNOSIS — E034 Atrophy of thyroid (acquired): Secondary | ICD-10-CM

## 2016-06-20 NOTE — Patient Instructions (Signed)
Thank you for taking time to come for your Medicare Wellness Visit. I appreciate your ongoing commitment to your health goals. Please review the following plan we discussed and let me know if I can assist you in the future.   These are the goals we discussed: Goals    . Exercise 3x per week (30 min per time)           Walking, more regularly    . Quit smoking / using tobacco          Continue self taper of tobacco pipe       This is a list of the screening recommended for you and due dates:  Health Maintenance  Topic Date Due  . Tetanus Vaccine  10/13/2023  . Flu Shot  Completed  . Shingles Vaccine  Completed  . Pneumonia vaccines  Completed   You will be due for FASTING BLOOD WORK (no food or drink after midnight before, only water or coffee without cream/sugar on the morning of)  - Please go ahead and schedule a "Lab Only" visit in the morning at the clinic for lab draw in AFTER 11/19/16 - Make sure Lab Only appointment is at least 1-2 weeks before your next appointment, so that results will be available  For Lab Results, once available within 2-3 days of blood draw, you can can log in to MyChart online to view your results and a brief explanation. Also, we can discuss results at next follow-up visit.  Please schedule a follow-up appointment with Dr. Parks Ranger in 5-6 months, for Annual Physical and lab review, AFTER 11/19/16  If you have any other questions or concerns, please feel free to call the clinic or send a message through [facility]. You may also schedule an earlier appointment if necessary.  [name], DO [facility]

## 2016-06-20 NOTE — Progress Notes (Signed)
Patient: Blake Bates, Male    DOB: May 31, 1940, 76 y.o.   MRN: [id number]  Visit Date: 06/20/2016  Today's Provider: [name], DO   Chief Complaint  Patient presents with  . Medicare Wellness    Subjective:   Blake Bates is a 76 y.o. male who presents today for his Subsequent Annual Wellness Visit.  HPI   Today doing well. No acute concerns. He will schedule for future annual medical physical to discuss chronic issues.  See below for additional documentation. Reports some limited exercise, but interested in walking more regularly. Trying to cut back on his own tobacco pipe smoking >60 years now, but down to quarter pipe or less daily. No prior quit attempts before.  Past Medical History:  Diagnosis Date  . Asthma   . Diverticulosis   . Full dentures   . Hypothyroidism    s/p subtotal thyroidectomy  . Muscle spasms of lower extremity    Right upper leg  . Osteoporosis   . Rhinitis, allergic    pollens, mold, animal dander   Review of Systems  Constitutional: Negative for activity change, appetite change, chills, diaphoresis, fatigue and fever.  HENT: Negative for congestion and hearing loss.   Eyes: Negative for visual disturbance.  Respiratory: Negative for cough, chest tightness, shortness of breath and wheezing.   Cardiovascular: Negative for chest pain, palpitations and leg swelling.  Gastrointestinal: Negative for abdominal pain, constipation, diarrhea, nausea and vomiting.  Endocrine: Negative for cold intolerance and polyuria.  Genitourinary: Negative for decreased urine volume, difficulty urinating, dysuria, frequency and hematuria.  Musculoskeletal: Positive for arthralgias. Negative for neck pain.  Skin: Negative for rash.  Allergic/Immunologic: Negative for environmental allergies.  Neurological: Negative for dizziness, weakness, light-headedness, numbness and headaches.  Hematological: Negative for adenopathy.  Psychiatric/Behavioral:  Negative for behavioral problems, decreased concentration, dysphoric mood and sleep disturbance.    Past Surgical History:  Procedure Laterality Date  . CATARACT EXTRACTION W/PHACO Left 09/20/2014   Procedure: CATARACT EXTRACTION PHACO AND INTRAOCULAR LENS PLACEMENT (IOC);  Surgeon: Leandrew Koyanagi, MD;  Location: [facility];  Service: Ophthalmology;  Laterality: Left;  . COLONOSCOPY    . PARATHYROIDECTOMY    . THYROID SURGERY     subtotal    Family History  Problem Relation Age of Onset  . Stroke Mother 63  . Cancer Father 14    Social History   Social History  . Marital status: Married    Spouse name: N/A  . Number of children: N/A  . Years of education: N/A   Occupational History  . Not on file.   Social History Main Topics  . Smoking status: Current Every Day Smoker    Packs/day: 0.00    Years: 60.00    Types: Pipe  . Smokeless tobacco: Current User     Comment: Self taper down from full pipe daily down to half to quarter pipe  . Alcohol use 1.2 oz/week    2 Glasses of wine per week  . Drug use: No  . Sexual activity: Not on file   Other Topics Concern  . Not on file   Social History Narrative  . No narrative on file    Outpatient Encounter Prescriptions as of 06/20/2016  Medication Sig  . albuterol (PROVENTIL HFA;VENTOLIN HFA) 108 (90 BASE) MCG/ACT inhaler Inhale into the lungs every 6 (six) hours as needed for wheezing or shortness of breath.  . Calcium Carbonate-Vitamin D (CALCIUM + D PO) Take by mouth daily. PM  .  Cholecalciferol (VITAMIN D-3 PO) Take by mouth daily. PM  . levothyroxine (SYNTHROID, LEVOTHROID) 50 MCG tablet Take 1 tablet (50 mcg total) by mouth daily.  [name] loratadine (CLARITIN) 10 MG tablet Take 10 mg by mouth daily as needed for allergies. PM   No facility-administered encounter medications on file as of 06/20/2016.     Functional Ability / Safety Screening 1.  Was the timed Get Up and Go test longer than 30 seconds?  no 2.   Does the patient need help with the phone, transportation, shopping,      preparing meals, housework, laundry, medications, or managing money?  no 3.  Does the patient's home have:  loose throw rugs in the hallway?   Has one loose throw rug in entrance way of home, but he is aware of it and has not had problem falling      Grab bars in the bathroom? yes      Handrails on the stairs?   yes      Poor lighting?   no 4.  Has the patient noticed any hearing difficulties?   yes  Fall Risk Assessment See under rooming  Depression Screen See under rooming Depression screen [facility] 2/9 06/20/2016 05/29/2015 11/03/2014  Decreased Interest 0 0 0  Down, Depressed, Hopeless 0 0 0  PHQ - 2 Score 0 0 0    Advanced Directives Does patient have a HCPOA?    yes (wife, Lindia "[name]" Banghart) If yes, name and contact information:  Does patient have a living will or MOST form?  no  Objective:   Vitals: BP 128/66 (BP Location: Left Arm, Cuff Size: Normal)   Pulse 60   Temp 97.5 F (36.4 C) (Oral)   Resp 16   Ht 5\' 10"  (1.778 m)   Wt 150 lb 9.6 oz (68.3 kg)   BMI 21.61 kg/m    Filed Weights   06/20/16 1018  Weight: 150 lb 9.6 oz (68.3 kg)   Body mass index is 21.61 kg/m.  Visual Acuity Screening   Right eye Left eye Both eyes  Without correction:     With correction: 20/25 20/30 20/25   Hearing Screening Comments: Pt wears hearing aids.  Physical Exam  Constitutional: He is oriented to person, place, and time. He appears well-developed and well-nourished. No distress.  Well-appearing, comfortable, cooperative  Cardiovascular: Normal rate.   Pulmonary/Chest: Effort normal.  Neurological: He is alert and oriented to person, place, and time.  Skin: Skin is warm and dry. He is not diaphoretic.  Psychiatric: He has a normal mood and affect. His behavior is normal.  Nursing note and vitals reviewed.   MMSE - Mini Mental State Exam 06/20/2016 05/29/2015  Orientation to time 5 5  Orientation to  Place 5 5  Registration 3 3  Attention/ Calculation 5 5  Recall 3 3  Language- name 2 objects 2 2  Language- repeat 1 1  Language- follow 3 step command 3 3  Language- read & follow direction 1 1  Write a sentence 1 1  Copy design 1 1  Total score 30 30    Assessment & Plan:     Annual Wellness Visit  Reviewed patient's Family Medical History Reviewed and updated list of patient's medical providers Assessment of cognitive impairment was done Assessed patient's functional ability Established a written schedule for health screening [place] Completed and Reviewed  Exercise Activities and Dietary recommendations Goals    . Exercise 3x per week (30 min per  time)          Trouble finding place to walk, but agreeable to work in gradually increasing regular exercise with walking    . Quit smoking / using tobacco          Wants to reduce tobacco for future, will continue self taper tobacco pipe. Given handout resources.       Immunization History  Administered Date(s) Administered  . Influenza, High Dose Seasonal PF 05/29/2015, 03/20/2016  . Pneumococcal Conjugate-13 05/30/2014  . Pneumococcal Polysaccharide-23 04/30/2012  . Tdap 10/12/2013  . Zoster 04/11/2012    Health Maintenance  Topic Date Due  . Samul Dada  10/13/2023  . INFLUENZA VACCINE  Completed  . ZOSTAVAX  Completed  . PNA vac Low Risk Adult  Completed    Discussed health benefits of physical activity, and encouraged him to engage in regular exercise appropriate for his age and condition.   No orders of the defined types were placed in this encounter.   Current Outpatient Prescriptions:  .  albuterol (PROVENTIL HFA;VENTOLIN HFA) 108 (90 BASE) MCG/ACT inhaler, Inhale into the lungs every 6 (six) hours as needed for wheezing or shortness of breath., Disp: , Rfl:  .  Calcium Carbonate-Vitamin D (CALCIUM + D PO), Take by mouth daily. PM, Disp: , Rfl:  .  Cholecalciferol (VITAMIN  D-3 PO), Take by mouth daily. PM, Disp: , Rfl:  .  levothyroxine (SYNTHROID, LEVOTHROID) 50 MCG tablet, Take 1 tablet (50 mcg total) by mouth daily., Disp: 90 tablet, Rfl: 2 .  loratadine (CLARITIN) 10 MG tablet, Take 10 mg by mouth daily as needed for allergies. PM, Disp: , Rfl:  There are no discontinued medications.  Next Medicare Wellness Visit in 12+ months  Schedule annual physical and fasting labs in 11/2016, will follow-up DEXA and other recommendations as discussed  [name], [facility] 06/20/2016, 12:23 PM

## 2016-09-11 ENCOUNTER — Other Ambulatory Visit: Payer: Self-pay

## 2016-09-11 DIAGNOSIS — E039 Hypothyroidism, unspecified: Secondary | ICD-10-CM

## 2016-09-11 MED ORDER — LEVOTHYROXINE SODIUM 50 MCG PO TABS: 50.0000 ug | ORAL_TABLET | Freq: Every day | ORAL | 3 refills | Status: DC

## 2016-09-11 NOTE — Telephone Encounter (Signed)
Last ov  2/9/18Last filled 12/18/15

## 2016-11-03 ENCOUNTER — Encounter: Payer: Self-pay | Admitting: Family Medicine

## 2016-11-03 ENCOUNTER — Ambulatory Visit
Admission: RE | Admit: 2016-11-03 | Discharge: 2016-11-03 | Disposition: A | Discharge status: Home / Self Care | Payer: Medicare Other | Source: Ambulatory Visit | Attending: Family Medicine | Admitting: Family Medicine

## 2016-11-03 ENCOUNTER — Ambulatory Visit (INDEPENDENT_AMBULATORY_CARE_PROVIDER_SITE_OTHER): Payer: Medicare Other | Admitting: Family Medicine

## 2016-11-03 VITALS — BP 153/67 | HR 61 | Ht 70.0 in | Wt 152.6 lb

## 2016-11-03 DIAGNOSIS — N401 Enlarged prostate with lower urinary tract symptoms: Secondary | ICD-10-CM | POA: Diagnosis not present

## 2016-11-03 DIAGNOSIS — M25561 Pain in right knee: Secondary | ICD-10-CM | POA: Diagnosis not present

## 2016-11-03 DIAGNOSIS — M25551 Pain in right hip: Secondary | ICD-10-CM

## 2016-11-03 DIAGNOSIS — N138 Other obstructive and reflux uropathy: Secondary | ICD-10-CM

## 2016-11-03 DIAGNOSIS — M25461 Effusion, right knee: Secondary | ICD-10-CM | POA: Insufficient documentation

## 2016-11-03 DIAGNOSIS — R350 Frequency of micturition: Secondary | ICD-10-CM | POA: Diagnosis not present

## 2016-11-03 DIAGNOSIS — M170 Bilateral primary osteoarthritis of knee: Secondary | ICD-10-CM | POA: Insufficient documentation

## 2016-11-03 DIAGNOSIS — M150 Primary generalized (osteo)arthritis: Secondary | ICD-10-CM | POA: Diagnosis not present

## 2016-11-03 DIAGNOSIS — G8929 Other chronic pain: Secondary | ICD-10-CM | POA: Insufficient documentation

## 2016-11-03 DIAGNOSIS — M8949 Other hypertrophic osteoarthropathy, multiple sites: Secondary | ICD-10-CM

## 2016-11-03 DIAGNOSIS — M2550 Pain in unspecified joint: Secondary | ICD-10-CM

## 2016-11-03 DIAGNOSIS — M159 Polyosteoarthritis, unspecified: Secondary | ICD-10-CM | POA: Insufficient documentation

## 2016-11-03 LAB — POCT URINALYSIS DIPSTICK
Bilirubin, UA: NEGATIVE
Blood, UA: NEGATIVE
Glucose, UA: NEGATIVE
Ketones, UA: NEGATIVE
Leukocytes, UA: NEGATIVE
Nitrite, UA: NEGATIVE
Protein, UA: NEGATIVE
Spec Grav, UA: 1.01 (ref 1.010–1.025)
Urobilinogen, UA: 0.2 E.U./dL
pH, UA: 5 (ref 5.0–8.0)

## 2016-11-03 MED ORDER — TAMSULOSIN HCL 0.4 MG PO CAPS: 0.4000 mg | ORAL_CAPSULE | Freq: Every day | ORAL | 5 refills | Status: DC

## 2016-11-03 NOTE — Progress Notes (Signed)
Subjective:    Patient ID: Blake Bates, male    DOB: 1940-11-27, 76 y.o.   MRN: [id number]  Blake Bates is a 76 y.o. male presenting on 11/03/2016 for Urinary Frequency (nocturia. Pt getting up every 2-3 hrs during the night. RT hip, RT knee and RT ankle pain that is causing some difficulty walking  )   HPI   Pain in Multiple Joints Right side (Hip, Knee, Ankle) - Last visit 06/2016 for annual medicare wellness, did not express these concerns but stated he was still trying to work on walking regularly - Reports gradual worsening joint pain over past >6 months also wife reports that he tends to be "shuffling" more and easy tendency to  "trip" with change in surfaces. Initially when these problems started thought to be more "stiffness" only and then worsening pain for R hip limiting his daily function. He is able to continue walking, has pedometer log today, but now admits he uses a large cane to help with walking, worse with difficulty changing position (from seated to standing, uses arms to push himself up) - No prior diagnosis or OA/DJD, no X-rays, or treatment. No known significant prior injury. - Not taking Tylenol or other OTC NSAIDs - Never done Physical Therapy - Denies any fevers/chills, numbness, tingling, weakness, loss of control bladder/bowel incontinence or retention, unintentional wt loss, night sweats  BPH / Nocturia: - Reports he has more frequent nocturia 4-5 x waking up to go to bathroom, worsening over past 3 months. He does not take any  - Drinks coffee daily 1.5 to 2 pots of coffee daily, has not changed in many years. Does not drink much water daily. - No PSA lab test available. States previously Prostate checked >10 years ago was normal - Family history of prostate cancer (grandfather on father's side passed from prostate CA around age 72) - Never on Flomax or other meds  AUA BPH Symptom Score over past 1 month 1. Sensation of not emptying bladder post  void - 1 2. Urinate less than 2 hour after finish last void - 2 3. Start/Stop several times during void - 0 4. Difficult to postpone urination - 3 5. Weak urinary stream - 0 6. Push or strain urination - 0 7. Nocturia - 4 times   Total Score: 10 (Moderate BPH symptoms)  Social History  Substance Use Topics  . Smoking status: Current Every Day Smoker    Packs/day: 0.00    Years: 60.00    Types: Pipe  . Smokeless tobacco: Current User     Comment: Self taper down from full pipe daily down to half to quarter pipe  . Alcohol use 1.2 oz/week    2 Glasses of wine per week    Review of Systems Per HPI unless specifically indicated above     Objective:    BP (!) 153/67 (BP Location: Left Arm, Patient Position: Sitting, Cuff Size: Normal)   Pulse 61   Ht 5\' 10"  (1.778 m)   Wt 152 lb 9.6 oz (69.2 kg)   BMI 21.90 kg/m   Wt Readings from Last 3 Encounters:  11/03/16 152 lb 9.6 oz (69.2 kg)  06/20/16 150 lb 9.6 oz (68.3 kg)  05/29/15 149 lb (67.6 kg)    Physical Exam  Constitutional: He is oriented to person, place, and time. He appears well-developed and well-nourished. No distress.  Well but thin appearing 76 year old male, comfortable, cooperative  HENT:  Head: Normocephalic and atraumatic.  Mouth/Throat: Oropharynx is clear and moist.  Eyes: Conjunctivae are normal.  Cardiovascular: Normal rate, regular rhythm, normal heart sounds and intact distal pulses.   No murmur heard. Pulmonary/Chest: Effort normal and breath sounds normal. No respiratory distress. He has no wheezes. He has no rales.  Abdominal: Soft. Bowel sounds are normal. He exhibits no distension. There is no tenderness.  Genitourinary:  Genitourinary Comments: Rectal/DRE: Normal external exam without hemorrhoids fissures or abnormality. DRE with palpation of moderately enlarged prostate smooth symmetrical without nodule or tenderness. Normal brown stool residue on glove following test.  Musculoskeletal: He  exhibits no edema.  Bilateral Knees Inspection: Mostly normal appearance and symmetrical. No ecchymosis or effusion. Palpation: Minimal tenderness to palpation R medial joint line. Bilateral crepitus R>L fine and palpable. ROM: Full active ROM bilaterally Special Testing: Lachman / Valgus/Varus tests negative with intact ligaments (ACL, MCL, LCL) Strength: 5/5 intact knee flex/ext, ankle dorsi/plantarflex Neurovascular: distally intact sensation light touch and pulses  Right Hip / Low Back Inspection: Normal appearance, thin body habitus, no spinal deformity, symmetrical. Palpation:  BACK - No tenderness over spinous processes. Bilateral lumbar paraspinal muscles non-tender but R>L has some hypertonicity HIP - Mild tender over compression R greater trochanter hip, compared to Left normal. ROM: BACK - Full active ROM forward flex / back extension, rotation L/R without discomfort HIP - intact hip flex/extend, reduce rotations, mild reduced external rotation R>L, significantly reduced internal rotation R>L but bilateral significant. Special Testing: Seated SLR negative for radicular pain bilaterally - FADIR with limited ROM and stiffness no pain. FABER mostly normal without pain. Acetabular compression without significant pain. Strength: Bilateral hip flex/ext 5/5, knee flex/ext 5/5, ankle dorsiflex/plantarflex 5/5 Neurovascular: intact distal sensation to light touch  Neurological: He is alert and oriented to person, place, and time.  Skin: Skin is warm and dry. No rash noted. He is not diaphoretic. No erythema.  Psychiatric: He has a normal mood and affect. His behavior is normal.  Nursing note and vitals reviewed.   I have personally reviewed the radiology report from 11/03/16 X-rays: Right Hip and Pelvis, Right Knee, Bilateral Knee AP Standing  CLINICAL DATA:  Chronic right knee pain which has worsened recently. No known injuries.  EXAM: BILATERAL KNEES STANDING - 1  VIEW  COMPARISON:  This examination is read in conjunction with the right knee x-rays obtained concurrently. No prior imaging.  FINDINGS: Chondrocalcinosis involving the lateral and medial menisci in both knees. Symmetric moderate to severe medial compartment joint space narrowing and mild lateral compartment joint space narrowing in both knees. Possible mild osseous demineralization diffusely. No acute, subacute or healed fractures.  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   Electronically Signed   By: Evangeline Dakin M.D.   On: 11/03/2016 14:19  CLINICAL DATA:  Chronic right knee pain which has worsened recently. No known injuries. ------------------------------------------ EXAM: RIGHT KNEE - COMPLETE 4+ VIEW  COMPARISON:  None.  FINDINGS: No evidence of acute, subacute or healed fractures. Chondrocalcinosis involving the medial and lateral menisci. Moderate to severe medial compartment joint space narrowing, mild lateral compartment joint space narrowing and mild patellofemoral compartment joint space narrowing. Calcified loose bodies in the superior joint space. Small joint effusion. Mild osseous demineralization is suspected.  IMPRESSION: 1. No acute or subacute osseous abnormality. 2. CPPD arthropathy with secondary osteoarthritis, moderate to severe in the medial compartment and mild in the lateral and patellofemoral compartments. 3.  Small joint effusion. Calcified loose bodies in the superior joint space.   Electronically Signed   By: Evangeline Dakin M.D.   On: 11/03/2016 14:21 ----------------------------------------------- CLINICAL DATA:  Chronic worsening right hip pain with no recent injury.  EXAM: DG HIP (WITH OR WITHOUT PELVIS) 2-3V RIGHT  COMPARISON:  None in PACs  FINDINGS: The bony pelvis  is subjectively osteopenic. There is no lytic nor blastic lesion. There is mild narrowing of both hip joint spaces medially. AP and lateral views of the right hip reveal preservation of the articular surface of the femoral head and acetabulum. The femoral neck, intertrochanteric, and subtrochanteric regions are normal.  IMPRESSION: Moderate asymmetric joint space loss of the right hip. No acute fracture or dislocation. Incidental note is made of similar osteoarthritic joint space loss of the left hip.   Electronically Signed   By: David  Martinique M.D.   On: 11/03/2016 14:18  Results for orders placed or performed in visit on 11/03/16  POCT Urinalysis Dipstick  Result Value Ref Range   Color, UA yellow    Clarity, UA clear    Glucose, UA neg    Bilirubin, UA neg    Ketones, UA neg    Spec Grav, UA 1.010 1.010 - 1.025   Blood, UA neg    pH, UA 5.0 5.0 - 8.0   Protein, UA neg    Urobilinogen, UA 0.2 0.2 or 1.0 E.U./dL   Nitrite, UA neg    Leukocytes, UA Negative Negative      Assessment & Plan:   Problem List Items Addressed This Visit    Osteoarthritis of multiple joints    See A&P Knee and Hip Pain      Relevant Orders   DG Knee Bilateral Standing AP (Completed)   DG Knee Complete 4 Views Right (Completed)   DG HIP UNILAT WITH PELVIS 2-3 VIEWS RIGHT (Completed)   Chronic right hip pain - Primary    Subacute on chronic gradual worsening problem with R Hip pain without injury or fall. - Suspect underlying previously undiagnosed osteoarthritis as likely cause  - No prior hip problems or imaging. - No radiation of pain or radicular symptoms - Inadequate conservative therapy   Plan: 1. Check x-rays today - R hip / pelvis, comparison bilateral hip - with significant OA/DJD, loss of joint space, R>L 2. Start regular Tylenol dosing, avoid chronic NSAIDs still 3. Maximize other conservative therapy discussed diagnosis and management of arthritis - topical muscle rub,  stretching, activity, ice/heat, RICE 4. Future consider adding other meds NSAID, Baclofen 5. Consider PT - may return for handwritten order for [facility] PT or [facility] PT 6. Follow-up 4 weeks for annual phys -  Consider future troch bursa steroid injection, PT, referral to Ortho      Relevant Orders   DG HIP UNILAT WITH PELVIS 2-3 VIEWS RIGHT (Completed)   Chronic pain of right knee    Subacute on chronic R > L generalized Knee pain without swelling without known injury or trauma - Suspected likely due to underlying osteoarthritis / DJD with known OA/DJD in other joints. - Clinically not consistent but cannot rule out meniscus or ligament injury - Able to bear weight, no knee instability or mechanical locking - No prior history of knee surgery, arthroscopy - Inadequate conservative therapy  Plan: 1. Check x-rays today - R Knee series and b/l standing AP comparison - with significant moderate to severe OA/DJD worse medial, loss of joint space, R>L 2. Start regular Tylenol  dosing, avoid chronic NSAIDs still 3. Maximize other conservative therapy discussed diagnosis and management of arthritis - topical muscle rub, stretching, activity, ice/heat, RICE 4. Future consider adding other meds NSAID, Baclofen 5. Consider PT - may return for handwritten order for [facility] PT or [facility] 6. 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 Start Tamsulosin 0.4mg  daily, advised  on benefits, risks, if BP low caution with sudden standing up or position change 2. Counseling on lifestyle - need to significantly reduce coffee caffeine intake, and increase water 3. Check PSA within 4 weeks for next visit 4. Follow-up 4 weeks, consider future increased dose vs other med in future such as add Finasteride alpha blocker to reduce prostate size (note anticipated change in PSA), future referral to Urology if remains uncontrolled      Relevant Medications   tamsulosin (FLOMAX) 0.4 MG CAPS capsule   Other Relevant Orders   Urine Culture    Other Visit Diagnoses    Urinary frequency       UA today negative, will add culture for complete work-up with new startin BPH therapy to rule out possible underlying infection   Relevant Orders   POCT Urinalysis Dipstick (Completed)   Urine Culture      Meds ordered this encounter  Medications  . tamsulosin (FLOMAX) 0.4 MG CAPS capsule    Sig: Take 1 capsule (0.4 mg total) by mouth daily.    Dispense:  30 capsule    Refill:  5    Follow up plan: Return in about 1 month (around 12/03/2016) for Annual physical.  [name], DO [facility] 11/04/2016, 1:18 AM

## 2016-11-03 NOTE — Patient Instructions (Addendum)
Thank you for coming to the clinic today.  1.  For joint pain R Hip and R Knee  Check X-rays today, will notify you with results.  Use RICE therapy: - R - Rest / relative rest with activity modification avoid overuse of joint - I - Ice packs (make sure you use a towel or sock / something to protect skin) - C - Compression with flexible Knee Sleeve or ACE wrap to apply pressure and reduce swelling allowing more support - E - Elevation - if significant swelling, lift leg above heart level (toes above your nose) to help reduce swelling, most helpful at night after day of being on your feet  Recommend to start taking Tylenol Extra Strength 500mg  tabs - take 1 to 2 tabs per dose (max 1000mg ) every 6-8 hours for pain (take regularly, don't skip a dose for next 7 days), max 24 hour daily dose is 6 tablets or 3000mg . In the future you can repeat the same everyday Tylenol course for 1-2 weeks at a time.   May try [facility], Aspercreme, Tiger Balm or similar muscle rub for hip and knee as needed.  Try to stay active but do not overdo it  2. For Prostate  You do have an enlarged prostate on exam today and by history.  Start Tamsulosin Flomax 0.4mg  daily - help urination  Recommend reducing coffee intake, may start with only 4 or less cups of coffee daily and drink more water instead. In future this can only help.  You will be due for FASTING BLOOD WORK (no food or drink after midnight before, only water or coffee without cream/sugar on the morning of)  - Please go ahead and schedule a "Lab Only" visit in the morning at the clinic for lab draw in 1 month  before next Annual Physical - Make sure Lab Only appointment is at least 1-2 weeks before your next appointment, so that results will be available  For Lab Results, once available within 2-3 days of blood draw, you can can log in to MyChart online to view your results and a brief explanation. Also, we can discuss results at next follow-up  visit.   Please schedule a Follow-up Appointment to: Return in about 1 month (around 12/03/2016) for Annual physical.  If you have any other questions or concerns, please feel free to call the clinic or send a message through [place]. You may also schedule an earlier appointment if necessary.  Additionally, you may be receiving a survey about your experience at our clinic within a few days to 1 week by e-mail or mail. We value your feedback.  [name], DO [place]

## 2016-11-04 ENCOUNTER — Other Ambulatory Visit: Payer: Self-pay | Admitting: Family Medicine

## 2016-11-04 ENCOUNTER — Encounter: Payer: Self-pay | Admitting: Family Medicine

## 2016-11-04 DIAGNOSIS — R7309 Other abnormal glucose: Secondary | ICD-10-CM

## 2016-11-04 DIAGNOSIS — N183 Chronic kidney disease, stage 3 unspecified: Secondary | ICD-10-CM | POA: Insufficient documentation

## 2016-11-04 DIAGNOSIS — R799 Abnormal finding of blood chemistry, unspecified: Secondary | ICD-10-CM

## 2016-11-04 DIAGNOSIS — E034 Atrophy of thyroid (acquired): Secondary | ICD-10-CM

## 2016-11-04 DIAGNOSIS — M810 Age-related osteoporosis without current pathological fracture: Secondary | ICD-10-CM

## 2016-11-04 DIAGNOSIS — M25561 Pain in right knee: Secondary | ICD-10-CM

## 2016-11-04 DIAGNOSIS — E785 Hyperlipidemia, unspecified: Secondary | ICD-10-CM | POA: Insufficient documentation

## 2016-11-04 DIAGNOSIS — N189 Chronic kidney disease, unspecified: Secondary | ICD-10-CM | POA: Insufficient documentation

## 2016-11-04 DIAGNOSIS — G8929 Other chronic pain: Secondary | ICD-10-CM | POA: Insufficient documentation

## 2016-11-04 DIAGNOSIS — N401 Enlarged prostate with lower urinary tract symptoms: Principal | ICD-10-CM

## 2016-11-04 DIAGNOSIS — E7800 Pure hypercholesterolemia, unspecified: Secondary | ICD-10-CM

## 2016-11-04 DIAGNOSIS — Z125 Encounter for screening for malignant neoplasm of prostate: Secondary | ICD-10-CM

## 2016-11-04 DIAGNOSIS — N138 Other obstructive and reflux uropathy: Secondary | ICD-10-CM

## 2016-11-04 DIAGNOSIS — R7989 Other specified abnormal findings of blood chemistry: Secondary | ICD-10-CM

## 2016-11-04 LAB — URINE CULTURE

## 2016-11-04 NOTE — Assessment & Plan Note (Signed)
Subacute on chronic gradual worsening problem with R Hip pain without injury or fall. - Suspect underlying previously undiagnosed osteoarthritis as likely cause  - No prior hip problems or imaging. - No radiation of pain or radicular symptoms - Inadequate conservative therapy   Plan: 1. Check x-rays today - R hip / pelvis, comparison bilateral hip - with significant OA/DJD, loss of joint space, R>L 2. Start regular Tylenol dosing, avoid chronic NSAIDs still 3. Maximize other conservative therapy discussed diagnosis and management of arthritis - topical muscle rub, stretching, activity, ice/heat, RICE 4. Future consider adding other meds NSAID, Baclofen 5. Consider PT - may return for handwritten order for [facility] PT or [facility] PT 6. Follow-up 4 weeks for annual phys -  Consider future troch bursa steroid injection, PT, referral to Ortho

## 2016-11-04 NOTE — Assessment & Plan Note (Signed)
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 Start Tamsulosin 0.4mg  daily, advised on benefits, risks, if BP low caution with sudden standing up or position change 2. Counseling on lifestyle - need to significantly reduce coffee caffeine intake, and increase water 3. Check PSA within 4 weeks for next visit 4. Follow-up 4 weeks, consider future increased dose vs other med in future such as add Finasteride alpha blocker to reduce prostate size (note anticipated change in PSA), future referral to Urology if remains uncontrolled

## 2016-11-04 NOTE — Assessment & Plan Note (Signed)
See A&P Knee and Hip Pain

## 2016-11-04 NOTE — Assessment & Plan Note (Addendum)
Subacute on chronic R > L generalized Knee pain without swelling without known injury or trauma - Suspected likely due to underlying osteoarthritis / DJD with known OA/DJD in other joints. - Clinically not consistent but cannot rule out meniscus or ligament injury - Able to bear weight, no knee instability or mechanical locking - No prior history of knee surgery, arthroscopy - Inadequate conservative therapy  Plan: 1. Check x-rays today - R Knee series and b/l standing AP comparison - with significant moderate to severe OA/DJD worse medial, loss of joint space, R>L 2. Start regular Tylenol dosing, avoid chronic NSAIDs still 3. Maximize other conservative therapy discussed diagnosis and management of arthritis - topical muscle rub, stretching, activity, ice/heat, RICE 4. Future consider adding other meds NSAID, Baclofen 5. Consider PT - may return for handwritten order for [facility] PT or [facility] PT 6. Follow-up 4 weeks for annual phys -  Consider future R knee joint steroid injection, PT, referral to Ortho

## 2016-11-25 ENCOUNTER — Other Ambulatory Visit: Payer: Medicare Other

## 2016-12-01 ENCOUNTER — Other Ambulatory Visit: Payer: Medicare Other

## 2016-12-01 DIAGNOSIS — Z125 Encounter for screening for malignant neoplasm of prostate: Secondary | ICD-10-CM

## 2016-12-01 DIAGNOSIS — N401 Enlarged prostate with lower urinary tract symptoms: Secondary | ICD-10-CM

## 2016-12-01 DIAGNOSIS — R7309 Other abnormal glucose: Secondary | ICD-10-CM

## 2016-12-01 DIAGNOSIS — R7989 Other specified abnormal findings of blood chemistry: Secondary | ICD-10-CM

## 2016-12-01 DIAGNOSIS — M810 Age-related osteoporosis without current pathological fracture: Secondary | ICD-10-CM

## 2016-12-01 DIAGNOSIS — E034 Atrophy of thyroid (acquired): Secondary | ICD-10-CM

## 2016-12-01 DIAGNOSIS — R799 Abnormal finding of blood chemistry, unspecified: Secondary | ICD-10-CM

## 2016-12-01 DIAGNOSIS — E7800 Pure hypercholesterolemia, unspecified: Secondary | ICD-10-CM

## 2016-12-01 DIAGNOSIS — N138 Other obstructive and reflux uropathy: Secondary | ICD-10-CM

## 2016-12-01 LAB — COMPLETE METABOLIC PANEL WITH GFR
ALT: 8 U/L — ABNORMAL LOW (ref 9–46)
AST: 16 U/L (ref 10–35)
Albumin: 4 g/dL (ref 3.6–5.1)
Alkaline Phosphatase: 69 U/L (ref 40–115)
BUN: 14 mg/dL (ref 7–25)
CO2: 27 mmol/L (ref 20–31)
Calcium: 9.2 mg/dL (ref 8.6–10.3)
Chloride: 101 mmol/L (ref 98–110)
Creat: 1.22 mg/dL — ABNORMAL HIGH (ref 0.70–1.18)
GFR, Est African American: 66 mL/min (ref >=60)
GFR, Est Non African American: 57 mL/min — ABNORMAL LOW (ref >=60)
Glucose, Bld: 86 mg/dL (ref 65–99)
Potassium: 3.9 mmol/L (ref 3.5–5.3)
Sodium: 137 mmol/L (ref 135–146)
Total Bilirubin: 0.9 mg/dL (ref 0.2–1.2)
Total Protein: 6.5 g/dL (ref 6.1–8.1)

## 2016-12-01 LAB — CBC WITH DIFFERENTIAL/PLATELET
Basophils Absolute: 0 cells/uL (ref 0–200)
Basophils Relative: 0 %
Eosinophils Absolute: 186 cells/uL (ref 15–500)
Eosinophils Relative: 3 %
HCT: 43.9 % (ref 38.5–50.0)
Hemoglobin: 14.5 g/dL (ref 13.2–17.1)
Lymphocytes Relative: 23 %
Lymphs Abs: 1426 cells/uL (ref 850–3900)
MCH: 31.5 pg (ref 27.0–33.0)
MCHC: 33 g/dL (ref 32.0–36.0)
MCV: 95.2 fL (ref 80.0–100.0)
MPV: 10.1 fL (ref 7.5–12.5)
Monocytes Absolute: 496 cells/uL (ref 200–950)
Monocytes Relative: 8 %
Neutro Abs: 4092 cells/uL (ref 1500–7800)
Neutrophils Relative %: 66 %
Platelets: 229 10*3/uL (ref 140–400)
RBC: 4.61 MIL/uL (ref 4.20–5.80)
RDW: 14.3 % (ref 11.0–15.0)
WBC: 6.2 10*3/uL (ref 3.8–10.8)

## 2016-12-01 LAB — LIPID PANEL
Cholesterol: 235 mg/dL — ABNORMAL HIGH (ref <200)
HDL: 93 mg/dL (ref >40)
LDL Cholesterol: 130 mg/dL — ABNORMAL HIGH (ref <100)
Total CHOL/HDL Ratio: 2.5 Ratio (ref <5.0)
Triglycerides: 59 mg/dL (ref <150)
VLDL: 12 mg/dL (ref <30)

## 2016-12-01 LAB — TSH: TSH: 2.53 mIU/L (ref 0.40–4.50)

## 2016-12-02 LAB — REFLEX PSA, FREE
PSA, % Free: 17 % — ABNORMAL LOW (ref >25)
PSA, Free: 0.7 ng/mL

## 2016-12-02 LAB — PSA, TOTAL WITH REFLEX TO PSA, FREE: PSA, Total: 4.2 ng/mL — ABNORMAL HIGH (ref <=4.0)

## 2016-12-02 LAB — HEMOGLOBIN A1C
Hgb A1c MFr Bld: 5.4 % (ref <5.7)
Mean Plasma Glucose: 108 mg/dL

## 2016-12-02 LAB — VITAMIN D 25 HYDROXY (VIT D DEFICIENCY, FRACTURES): Vit D, 25-Hydroxy: 27 ng/mL — ABNORMAL LOW (ref 30–100)

## 2016-12-03 ENCOUNTER — Encounter: Payer: Self-pay | Admitting: Family Medicine

## 2016-12-03 ENCOUNTER — Encounter: Payer: Medicare Other | Admitting: Family Medicine

## 2016-12-09 ENCOUNTER — Encounter: Payer: Self-pay | Admitting: Family Medicine

## 2016-12-09 ENCOUNTER — Ambulatory Visit (INDEPENDENT_AMBULATORY_CARE_PROVIDER_SITE_OTHER): Payer: Medicare Other | Admitting: Family Medicine

## 2016-12-09 ENCOUNTER — Other Ambulatory Visit: Payer: Self-pay | Admitting: Family Medicine

## 2016-12-09 VITALS — BP 135/66 | HR 55 | Temp 97.6°F | Resp 16 | Ht 70.0 in | Wt 149.8 lb

## 2016-12-09 DIAGNOSIS — N183 Chronic kidney disease, stage 3 unspecified: Secondary | ICD-10-CM

## 2016-12-09 DIAGNOSIS — M159 Polyosteoarthritis, unspecified: Secondary | ICD-10-CM

## 2016-12-09 DIAGNOSIS — N138 Other obstructive and reflux uropathy: Secondary | ICD-10-CM

## 2016-12-09 DIAGNOSIS — R350 Frequency of micturition: Secondary | ICD-10-CM

## 2016-12-09 DIAGNOSIS — E034 Atrophy of thyroid (acquired): Secondary | ICD-10-CM

## 2016-12-09 DIAGNOSIS — M8949 Other hypertrophic osteoarthropathy, multiple sites: Secondary | ICD-10-CM

## 2016-12-09 DIAGNOSIS — N401 Enlarged prostate with lower urinary tract symptoms: Secondary | ICD-10-CM

## 2016-12-09 DIAGNOSIS — M150 Primary generalized (osteo)arthritis: Secondary | ICD-10-CM

## 2016-12-09 DIAGNOSIS — R972 Elevated prostate specific antigen [PSA]: Secondary | ICD-10-CM | POA: Diagnosis not present

## 2016-12-09 DIAGNOSIS — E782 Mixed hyperlipidemia: Secondary | ICD-10-CM | POA: Diagnosis not present

## 2016-12-09 DIAGNOSIS — E559 Vitamin D deficiency, unspecified: Secondary | ICD-10-CM

## 2016-12-09 MED ORDER — ASPIRIN EC 81 MG PO TBEC: 81.0000 mg | DELAYED_RELEASE_TABLET | Freq: Every day | ORAL | Status: DC

## 2016-12-09 NOTE — Assessment & Plan Note (Addendum)
Borderline elevated PSA 4.2 in average risk patient, with prior reported negative screening - Last PSA 4.2 (11/2016), prior values normal - Last DRE moderately enlarged (10/2016) - Clinically improved BPH symptoms now with better hydration - Fam history prostate CA paternal grandfather age 76  Plan: 1. Reviewed prostate cancer screening guidelines and risks including potential prostate biopsy if abnormal PSA 2. Re-check PSA in 6 months due to mild elevation - if still abnormal consider DRE vs Urology consult, also with some mild BPH

## 2016-12-09 NOTE — Assessment & Plan Note (Signed)
Stable chronic CKD-III based on last lab Cr and GFR, likely primarily related to age, also with chronic low dose NSAID use for arthritis - Continue improve hydration - Discussion on NSAIDs - stop Aleve 250mg  daily now try higher dose Tylenol daily and may use Aleve PRN breakthrough - Follow-up re-check BMET Cr trend q 6 months for now

## 2016-12-09 NOTE — Assessment & Plan Note (Signed)
Mild low Vit D 27 On Vit D supplement 400iu daily Advise inc to OTC Vitamin D3 5,000 daily for 8-12 weeks, then can resume 800 to 1000 iu daily

## 2016-12-09 NOTE — Assessment & Plan Note (Addendum)
Improved LUTS due to better hydration, failed trial on Flomax due to side effect Chronic BPH with lower urinary tract symptoms (LUTS) without obstruction - AUA BPH score 5 (mild, today) improved from 10 (moderate) - Failed Flomax 0.4mg  (intolerance side effects) - Last PSA 4.2, mild elevated 11/2016 - Last DRE moderately enlarged (10/2016) - Fam history prostate CA paternal grandfather age 76  Plan: 1. Remain off Flomax 2. Counseling on lifestyle - continue improved hydration water intake, and less coffee / caffeine 3. Re check PSA within 6 mo for trend with elevated PSA 4. Follow-up 6 months for AMW - review lab otherwise return sooner if need can adjust meds for prostate but defer for now

## 2016-12-09 NOTE — Assessment & Plan Note (Signed)
Controlled cholesterol on lifestyle but now with some elevated LDL, TC, very good HDL Never on Statin Last lipid panel 11/2016 Calculated ASCVD 10 yr risk score >25% primarily based on age, similar to baseline risk  Plan: 1. Discussed ASCVD risk reduction strategy - start ASA 81mg  for primary ASCVD risk reduction, offered statin therapy in addition he declines now will try to improve lifestyle and recheck cholesterol in future 2. Encourage improved lifestyle - low carb/cholesterol, reduce portion size, continue improving regular exercise 3. Follow-up 6 mo AMW, ordered fasting labs

## 2016-12-09 NOTE — Patient Instructions (Addendum)
Thank you for coming to the clinic today.  1. For Vitamin D - Take up to vitamin D3 5,000 units daily for 8 weeks, then reduce dose back to maintenance 800 to 1000iu daily  2. For Prostate - likely have BPH as discussed, this could be reason for mild elevated PSA 4.2 - Stay off Flomax if had side effect - Continue improved hydration seems to be helping urinary symptoms - Will plan to re-check PSA in 6 months, to get trend, if still elevated or other concerns, can refer to Urology  3. Creatinine kidney function mildly elevated, can re-check in 6 months as well  4. Recommend to start taking Tylenol Extra Strength 500mg  tabs - take 1 to 2 tabs per dose (max 1000mg ) every 6-8 hours for pain (take regularly, don't skip a dose for next 7 days), max 24 hour daily dose is 6 tablets or 3000mg . In the future you can repeat the same everyday Tylenol course for 1-2 weeks at a time.   Prefer to limit Aleve 250mg  daily - but if tylenol isn't working, can certainly still take this needed  5. Discussed risk of Cardiovascular (heart attack or stroke) increased risk due to age primarily also cholesterol - Recommend starting OTC baby aspirin daily 81mg  - In future may recommend adding Statin cholesterol pill   Please schedule a Follow-up Appointment to: Return in about 6 months (around 06/11/2017) for Annual Medicare Wellness.  If you have any other questions or concerns, please feel free to call the clinic or send a message through [place]. You may also schedule an earlier appointment if necessary.  Additionally, you may be receiving a survey about your experience at our clinic within a few days to 1 week by e-mail or mail. We value your feedback.  [name], DO [place]

## 2016-12-09 NOTE — Assessment & Plan Note (Signed)
Stable chronic problem OA/DJD Taking Aleve OTC 250mg  daily, most days Advised to stop aleve and switch to regular higher dose Tylenol dosing, limit NSAID to avoid harm with CKD, may still take Aleve OTC 1-2 per day PRN breakthrough

## 2016-12-09 NOTE — Assessment & Plan Note (Signed)
Stable, controlled Continue Levothyroxine 79mcg daily Last TSH normal 11/2016, check yearly

## 2016-12-09 NOTE — Progress Notes (Signed)
Subjective:    Patient ID: Blake Bates, male    DOB: 08-01-1940, 76 y.o.   MRN: [id number]  Blake Bates is a 77 y.o. male presenting on 12/09/2016 for Benign Prostatic Hypertrophy and Other (Lab results)   HPI   Here for yearly doctors visit and lab result review.  No history of HTN, DM  HYPERLIPIDEMIA / Lifestyle: - Reports no concerns. Last lipid panel 11/2016, elevated TC, LDL, but very good HDL 93 - Never on prior Statin cholesterol med - Not taking ASA 14m daily but interested to try this Lifestyle - Diet: Balanced diet, lower carb/sugar, drinking more water now - Exercise: Active walking 1-3x daily, often around the house, pedometer >300 steps per time, uses walking staff - No history of PreDM, DM, or HTN in past  CKD-III No significant known history of CKD in past. Recent lab with mild elevated Cr up to 1.22, previous 1.13. He has improved water intake, and reduced coffee - Additionally he has osteoarthritis multiple joints, taking Aleve OTC 2531monce daily for arthritis pain, taking almost everyday, has not tried taking higher dose Tylenol instead  Hypothyroidism Chronic history of hypothyroid, on levothyroxine replacement, has been stable on 5053mdaily for while, last lab TSH normal 11/2016  Vitamin D Deficiency Last lab with low vitamin D 27. He was taking Vitamin D + calcium supplement 400 to 800 iu vit D  BPH / Nocturia / Elevated PSA: - Last visit with me 11/03/16, for initial visit for same problem BPH with elevated AUA BPH score 10, treated with new start Flomax 0.4mg10mee prior notes for background information. - Interval update with patient self discontinued Flomax due to potential side effect, waking up around 0600 with "pounding heart and tightening in chest" did not help urinary symptoms, has done better when stopped this. Also he has significantly reduced coffee intake and improved water - Today patient reports seems to have improved urinary  symptoms, now with better hydration - Recent PSA 4.2 with appropriate Free %, he is not concerned about prostate cancer, and does admit to having paternal grandfather age 89 w49h prostate CA, he would like to repeat PSA in future - Reduced urinary frequency now  AUA BPH Symptom Score over past 1 month 1. Sensation of not emptying bladder post void - 0 (1) 2. Urinate less than 2 hour after finish last void - 0 (2) 3. Start/Stop several times during void - 0 4. Difficult to postpone urination - 2 (3) 5. Weak urinary stream - 0 6. Push or strain urination - 0 7. Nocturia - 3 (4) times   Total Score: 5 (mild) - compared to last score 11/03/16 of 10 with moderate)  Health Maintenance: - Prostate CA Screening, see above, mildly elevated PSA 4.2, due to repeat in future - UTD Vaccines  Past Medical History:  Diagnosis Date  . Asthma   . Diverticulosis   . Full dentures   . Hypothyroidism    s/p subtotal thyroidectomy  . Muscle spasms of lower extremity    Right upper leg  . Osteoporosis   . Rhinitis, allergic    pollens, mold, animal dander   Past Surgical History:  Procedure Laterality Date  . CATARACT EXTRACTION W/PHACO Left 09/20/2014   Procedure: CATARACT EXTRACTION PHACO AND INTRAOCULAR LENS PLACEMENT (IOC);  Surgeon: ChadLeandrew Koyanagi;  [place] [facility] [place];  Laterality: Left;  . COLONOSCOPY    . PARATHYROIDECTOMY    . THYROID SURGERY  subtotal   Social History   Social History  . Marital status: Married    Spouse name: N/A  . Number of children: N/A  . Years of education: N/A   Occupational History  . Not on file.   Social History Main Topics  . Smoking status: Current Every Day Smoker    Packs/day: 0.00    Years: 60.00    Types: Pipe  . Smokeless tobacco: Current User     Comment: Self taper down from full pipe daily down to half to quarter pipe  . Alcohol use 1.2 oz/week    2 Glasses of wine per week  . Drug  use: No  . Sexual activity: Not on file   Other Topics Concern  . Not on file   Social History Narrative  . No narrative on file   Family History  Problem Relation Age of Onset  . Stroke Mother 68  . Cancer Father 10  . Prostate cancer Paternal Grandfather 52   Current Outpatient Prescriptions on File Prior to Visit  Medication Sig  . albuterol (PROVENTIL HFA;VENTOLIN HFA) 108 (90 BASE) MCG/ACT inhaler Inhale into the lungs every 6 (six) hours as needed for wheezing or shortness of breath.  . Calcium Carbonate-Vitamin D (CALCIUM + D PO) Take by mouth daily. PM  . Cholecalciferol (VITAMIN D-3 PO) Take by mouth daily. PM  . levothyroxine (SYNTHROID, LEVOTHROID) 50 MCG tablet Take 1 tablet (50 mcg total) by mouth daily.  [name] loratadine (CLARITIN) 10 MG tablet Take 10 mg by mouth daily as needed for allergies. PM   No current facility-administered medications on file prior to visit.     Review of Systems  Constitutional: Negative for activity change, appetite change, chills, diaphoresis, fatigue and fever.  HENT: Negative for congestion, hearing loss and sinus pressure.   Eyes: Negative for visual disturbance.  Respiratory: Negative for apnea, cough, chest tightness, shortness of breath and wheezing.   Cardiovascular: Negative for chest pain, palpitations and leg swelling.  Gastrointestinal: Negative for abdominal pain, anal bleeding, blood in stool, constipation, diarrhea, nausea and vomiting.  Endocrine: Negative for cold intolerance and polyuria (improved).  Genitourinary: Negative for decreased urine volume, difficulty urinating, dysuria, frequency (improved), hematuria and testicular pain.       Reduced nocturia  Musculoskeletal: Positive for arthralgias. Negative for back pain and neck pain.  Skin: Negative for rash.  Allergic/Immunologic: Negative for environmental allergies.  Neurological: Negative for dizziness, weakness, light-headedness, numbness and headaches.    Hematological: Negative for adenopathy.  Psychiatric/Behavioral: Negative for behavioral problems, dysphoric mood and sleep disturbance. The patient is not nervous/anxious.    Per HPI unless specifically indicated above     Objective:    BP 135/66   Pulse (!) 55   Temp 97.6 F (36.4 C) (Oral)   Resp 16   Ht _0  (1.778 m)   Wt 149 lb 12.8 oz (67.9 kg)   BMI 21.49 kg/m   Wt Readings from Last 3 Encounters:  12/09/16 149 lb 12.8 oz (67.9 kg)  11/03/16 152 lb 9.6 oz (69.2 kg)  06/20/16 150 lb 9.6 oz (68.3 kg)    Physical Exam  Constitutional: He is oriented to person, place, and time. He appears well-developed and well-nourished. No distress.  Well-appearing, comfortable, cooperative  HENT:  Head: Normocephalic and atraumatic.  Mouth/Throat: Oropharynx is clear and moist.  Eyes: Pupils are equal, round, and reactive to light. Conjunctivae and EOM are normal. Right eye exhibits no discharge. Left eye exhibits no  discharge.  Neck: Normal range of motion. Neck supple. No thyromegaly present.  Cardiovascular: Normal rate, regular rhythm, normal heart sounds and intact distal pulses.   No murmur heard. Pulmonary/Chest: Effort normal and breath sounds normal. No respiratory distress. He has no wheezes. He has no rales.  Abdominal: Soft. Bowel sounds are normal. He exhibits no distension and no mass. There is no tenderness.  Musculoskeletal: Normal range of motion. He exhibits no edema or tenderness.  Upper / Lower Extremities: - Normal muscle tone, strength bilateral upper extremities 5/5, lower extremities 5/5  Lymphadenopathy:    He has no cervical adenopathy.  Neurological: He is alert and oriented to person, place, and time.  Distal sensation intact to light touch all extremities  Skin: Skin is warm and dry. No rash noted. He is not diaphoretic. No erythema.  Psychiatric: He has a normal mood and affect. His behavior is normal.  Well groomed, good eye contact, normal speech  and thoughts  Nursing note and vitals reviewed.  Results for orders placed or performed in visit on 12/01/16  COMPLETE METABOLIC PANEL WITH GFR  Result Value Ref Range   Sodium 137 135 - 146 mmol/L   Potassium 3.9 3.5 - 5.3 mmol/L   Chloride 101 98 - 110 mmol/L   CO2 27 20 - 31 mmol/L   Glucose, Bld 86 65 - 99 mg/dL   BUN 14 7 - 25 mg/dL   Creat 1.22 (H) 0.70 - 1.18 mg/dL   Total Bilirubin 0.9 0.2 - 1.2 mg/dL   Alkaline Phosphatase 69 40 - 115 U/L   AST 16 10 - 35 U/L   ALT 8 (L) 9 - 46 U/L   Total Protein 6.5 6.1 - 8.1 g/dL   Albumin 4.0 3.6 - 5.1 g/dL   Calcium 9.2 8.6 - 10.3 mg/dL   GFR, Est African American 66 >=60 mL/min   GFR, Est Non African American 57 (L) >=60 mL/min  Lipid panel  Result Value Ref Range   Cholesterol 235 (H) <200 mg/dL   Triglycerides 59 <150 mg/dL   HDL 93 >40 mg/dL   Total CHOL/HDL Ratio 2.5 <5.0 Ratio   VLDL 12 <30 mg/dL   LDL Cholesterol 130 (H) <100 mg/dL  Hemoglobin A1c  Result Value Ref Range   Hgb A1c MFr Bld 5.4 <5.7 %   Mean Plasma Glucose 108 mg/dL  CBC with Differential/Platelet  Result Value Ref Range   WBC 6.2 3.8 - 10.8 K/uL   RBC 4.61 4.20 - 5.80 MIL/uL   Hemoglobin 14.5 13.2 - 17.1 g/dL   HCT 43.9 38.5 - 50.0 %   MCV 95.2 80.0 - 100.0 fL   MCH 31.5 27.0 - 33.0 pg   MCHC 33.0 32.0 - 36.0 g/dL   RDW 14.3 11.0 - 15.0 %   Platelets 229 140 - 400 K/uL   MPV 10.1 7.5 - 12.5 fL   Neutro Abs 4,092 1,500 - 7,800 cells/uL   Lymphs Abs 1,426 850 - 3,900 cells/uL   Monocytes Absolute 496 200 - 950 cells/uL   Eosinophils Absolute 186 15 - 500 cells/uL   Basophils Absolute 0 0 - 200 cells/uL   Neutrophils Relative % 66 %   Lymphocytes Relative 23 %   Monocytes Relative 8 %   Eosinophils Relative 3 %   Basophils Relative 0 %   Smear Review Criteria for review not met   PSA, Total with Reflex to PSA, Free  Result Value Ref Range   PSA, Total 4.2 (H) <=4.0  ng/mL  TSH  Result Value Ref Range   TSH 2.53 0.40 - 4.50 mIU/L  VITAMIN  D 25 Hydroxy (Vit-D Deficiency, Fractures)  Result Value Ref Range   Vit D, 25-Hydroxy 27 (L) 30 - 100 ng/mL  reflex PSA, Free  Result Value Ref Range   PSA, Free 0.7 Not Estab ng/mL   PSA, % Free 17 (L) >25 %      Assessment & Plan:   Problem List Items Addressed This Visit    Vitamin D deficiency    Mild low Vit D 27 On Vit D supplement 400iu daily Advise inc to OTC Vitamin D3 5,000 daily for 8-12 weeks, then can resume 800 to 1000 iu daily      Osteoarthritis of multiple joints    Stable chronic problem OA/DJD Taking Aleve OTC 215m daily, most days Advised to stop aleve and switch to regular higher dose Tylenol dosing, limit NSAID to avoid harm with CKD, may still take Aleve OTC 1-2 per day PRN breakthrough      Relevant Medications   aspirin EC 81 MG tablet   Hypothyroidism    Stable, controlled Continue Levothyroxine 578m daily Last TSH normal 11/2016, check yearly      Hyperlipidemia    Controlled cholesterol on lifestyle but now with some elevated LDL, TC, very good HDL Never on Statin Last lipid panel 11/2016 Calculated ASCVD 10 yr risk score >25% primarily based on age, similar to baseline risk  Plan: 1. Discussed ASCVD risk reduction strategy - start ASA 8191mor primary ASCVD risk reduction, offered statin therapy in addition he declines now will try to improve lifestyle and recheck cholesterol in future 2. Encourage improved lifestyle - low carb/cholesterol, reduce portion size, continue improving regular exercise 3. Follow-up 6 mo AMW, ordered fasting labs      Relevant Medications   aspirin EC 81 MG tablet   Elevated PSA    Borderline elevated PSA 4.2 in average risk patient, with prior reported negative screening - Last PSA 4.2 (11/2016), prior values normal - Last DRE moderately enlarged (10/2016) - Clinically improved BPH symptoms now with better hydration - Fam history prostate CA paternal grandfather age 11 70lan: 1. Reviewed prostate cancer  screening guidelines and risks including potential prostate biopsy if abnormal PSA 2. Re-check PSA in 6 months due to mild elevation - if still abnormal consider DRE vs Urology consult, also with some mild BPH      CKD (chronic kidney disease), stage III    Stable chronic CKD-III based on last lab Cr and GFR, likely primarily related to age, also with chronic low dose NSAID use for arthritis - Continue improve hydration - Discussion on NSAIDs - stop Aleve [id number] now try higher dose Tylenol daily and may use Aleve PRN breakthrough - Follow-up re-check BMET Cr trend q 6 months for now      BPH with obstruction/lower urinary tract symptoms - Primary    Improved LUTS due to better hydration, failed trial on Flomax due to side effect 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 Remain off Flomax 2. Counseling on lifestyle - continue improved hydration water intake, and less coffee / caffeine 3. Re check PSA within 6 mo for trend  with elevated PSA 4. Follow-up 6 months for AMW - review lab otherwise return sooner if need can adjust meds for prostate but defer for now       Other Visit Diagnoses    Urinary frequency       Improved now on better hydration      Meds ordered this encounter  Medications  . aspirin EC 81 MG tablet    Sig: Take 1 tablet (81 mg total) by mouth daily.    Follow up plan: Return in about 6 months (around 06/11/2017) for Annual Medicare Wellness.   [name], [facility] 12/09/2016, 3:50 PM

## 2017-02-11 ENCOUNTER — Ambulatory Visit (INDEPENDENT_AMBULATORY_CARE_PROVIDER_SITE_OTHER): Payer: Medicare Other

## 2017-02-11 DIAGNOSIS — Z23 Encounter for immunization: Secondary | ICD-10-CM | POA: Diagnosis not present

## 2017-06-09 ENCOUNTER — Ambulatory Visit (INDEPENDENT_AMBULATORY_CARE_PROVIDER_SITE_OTHER): Payer: Medicare Other

## 2017-06-09 VITALS — BP 140/72 | HR 74 | Temp 98.1°F | Resp 16 | Ht 70.0 in | Wt 156.6 lb

## 2017-06-09 DIAGNOSIS — Z0000 Encounter for general adult medical examination without abnormal findings: Secondary | ICD-10-CM

## 2017-06-09 NOTE — Progress Notes (Signed)
Subjective:   Blake Bates is a 77 y.o. male who presents for Medicare Annual/Subsequent preventive examination.  Review of Systems:   Cardiac Risk Factors include: hypertension;male gender;advanced age (>53men, >35 women);dyslipidemia     Objective:    Vitals: BP 140/72 (BP Location: Left Arm, Patient Position: Sitting)   Pulse 74   Temp 98.1 F (36.7 C) (Oral)   Resp 16   Ht 5\' 10"  (1.778 m)   Wt 156 lb 9.6 oz (71 kg)   BMI 22.47 kg/m   Body mass index is 22.47 kg/m.  Advanced Directives 06/09/2017 06/20/2016 05/29/2015  Does Patient Have a Medical Advance Directive? Yes Yes No  Type of Paramedic of [place];Living will - -  Does patient want to make changes to medical advance directive? - - Yes - information given  Copy of [place] in Chart? No - copy requested - -  Would patient like information on creating a medical advance directive? - - Yes - Educational materials given    Tobacco Social History   Tobacco Use  Smoking Status Former Smoker  . Packs/day: 0.00  . Years: 60.00  . Pack years: 0.00  . Types: Pipe  . Last attempt to quit: 05/12/2017  . Years since quitting: 0.0  Smokeless Tobacco Never Used  Tobacco Comment   Self taper down from full pipe daily down to half to quarter pipe     Counseling given: Not Answered Comment: Self taper down from full pipe daily down to half to quarter pipe   Clinical Intake:  Pre-visit preparation completed: Yes  Pain : 0-10 Pain Score: 2  Pain Type: Chronic pain Pain Location: Knee Pain Orientation: Right, Left Pain Descriptors / Indicators: Aching Pain Onset: More than a month ago Pain Frequency: Constant     Nutritional Status: BMI of 19-24  Normal Nutritional Risks: None Diabetes: No  How often do you need to have someone help you when you read instructions, pamphlets, or other written materials from your doctor or pharmacy?: 1 - Never What is the last  grade level you completed in school?: PHD  Interpreter Needed?: No  Information entered by :: Tiffany Hill,LPN   Past Medical History:  Diagnosis Date  . Asthma   . Diverticulosis   . Full dentures   . Hyperlipidemia   . Hypothyroidism    s/p subtotal thyroidectomy  . Muscle spasms of lower extremity    Right upper leg  . Osteoporosis   . Rhinitis, allergic    pollens, mold, animal dander   Past Surgical History:  Procedure Laterality Date  . CATARACT EXTRACTION W/PHACO Left 09/20/2014   Procedure: CATARACT EXTRACTION PHACO AND INTRAOCULAR LENS PLACEMENT (IOC);  Surgeon: Leandrew Koyanagi, MD;  Location: [place];  Service: Ophthalmology;  Laterality: Left;  . COLONOSCOPY    . PARATHYROIDECTOMY    . THYROID SURGERY     subtotal   Family History  Problem Relation Age of Onset  . Stroke Mother 43  . Lung cancer Father 67  . Prostate cancer Paternal Grandfather 33  . Stroke Paternal Aunt   . Stroke Maternal Grandfather    Social History   Socioeconomic History  . Marital status: Married    Spouse name: None  . Number of children: None  . Years of education: None  . Highest education level: None  Social Needs  . Financial resource strain: Not hard at all  . Food insecurity - worry: Never true  .  Food insecurity - inability: Never true  . Transportation needs - medical: No  . Transportation needs - non-medical: No  Occupational History  . None  Tobacco Use  . Smoking status: Former Smoker    Packs/day: 0.00    Years: 60.00    Pack years: 0.00    Types: Pipe    Last attempt to quit: 05/12/2017    Years since quitting: 0.0  . Smokeless tobacco: Never Used  . Tobacco comment: Self taper down from full pipe daily down to half to quarter pipe  Substance and Sexual Activity  . Alcohol use: Yes    Alcohol/week: 1.2 oz    Types: 2 Glasses of wine per week  . Drug use: No  . Sexual activity: None  Other Topics Concern  . None  Social History  Narrative   Clubs- [employer]     Outpatient Encounter Medications as of 06/09/2017  Medication Sig  . aspirin EC 81 MG tablet Take 1 tablet (81 mg total) by mouth daily.  . Calcium Carbonate-Vitamin D (CALCIUM + D PO) Take by mouth daily. PM  . Cholecalciferol (VITAMIN D-3 PO) Take by mouth daily. PM  . levothyroxine (SYNTHROID, LEVOTHROID) 50 MCG tablet Take 1 tablet (50 mcg total) by mouth daily.  [name] albuterol (PROVENTIL HFA;VENTOLIN HFA) 108 (90 BASE) MCG/ACT inhaler Inhale into the lungs every 6 (six) hours as needed for wheezing or shortness of breath.  . loratadine (CLARITIN) 10 MG tablet Take 10 mg by mouth daily as needed for allergies. PM   No facility-administered encounter medications on file as of 06/09/2017.     Activities of Daily Living In your present state of health, do you have any difficulty performing the following activities: 06/09/2017 06/20/2016  Hearing? N Y  Comment - hearing aids  Vision? Y Y  Comment - cataract surgery implant for near sight but needs glasses for far sight  Difficulty concentrating or making decisions? N N  Walking or climbing stairs? N Y  Comment - arthritis  Dressing or bathing? N N  Doing errands, shopping? N N  Preparing Food and eating ? N N  Using the Toilet? N N  In the past six months, have you accidently leaked urine? N Y  Do you have problems with loss of bowel control? N N  Managing your Medications? N N  Managing your Finances? N N  Housekeeping or managing your Housekeeping? N N  Some recent data might be hidden    Patient Care Team: [name], DO as PCP - General (Family Medicine)   Assessment:   This is a routine wellness examination for Blake Bates.  Exercise Activities and Dietary recommendations Current Exercise Habits: Home exercise routine, Type of exercise: walking, Time (Minutes): 15, Frequency (Times/Week): 7, Weekly Exercise (Minutes/Week): 105, Intensity: Mild, Exercise limited by: None  identified  Goals    . DIET - INCREASE WATER INTAKE     Recommend drinking at least 6-8 glasses of water a day    . Exercise 3x per week (30 min per time)     Trouble finding place to walk.        Fall Risk Fall Risk  06/09/2017 11/03/2016 06/20/2016 05/29/2015 11/03/2014  Falls in the past year? No No No No No  Risk for fall due to : - History of fall(s);Impaired balance/gait;Impaired vision;Impaired mobility - - -   Is the patient's home free of loose throw rugs in walkways, pet beds, electrical cords, etc?   no  Grab bars in the bathroom? no      Handrails on the stairs?   no      Adequate lighting?   no  Timed Get Up and Go Performed: completed in 10 seconds with no use of assistive devices. Steady gait. No intervention needed at this time.  Depression Screen PHQ 2/9 Scores 06/09/2017 06/20/2016 05/29/2015 11/03/2014  PHQ - 2 Score 0 0 0 0    Cognitive Function MMSE - Mini Mental State Exam 06/20/2016 05/29/2015  Orientation to time 5 5  Orientation to Place 5 5  Registration 3 3  Attention/ Calculation 5 5  Recall 3 3  Language- name 2 objects 2 2  Language- repeat 1 1  Language- follow 3 step command 3 3  Language- read & follow direction 1 1  Write a sentence 1 1  Copy design 1 1  Total score 30 30     6CIT Screen 06/09/2017  What Year? 0 points  What month? 0 points  What time? 0 points  Count back from 20 0 points  Months in reverse 0 points  Repeat phrase 2 points  Total Score 2    Immunization History  Administered Date(s) Administered  . Influenza, High Dose Seasonal PF 05/29/2015, 03/20/2016, 02/11/2017  . Pneumococcal Conjugate-13 05/30/2014  . Pneumococcal Polysaccharide-23 04/30/2012  . Tdap 10/12/2013  . Zoster 04/11/2012    Qualifies for Shingles Vaccine? Yes, discussed shingrix vaccine  Screening Tests Health Maintenance  Topic Date Due  . TETANUS/TDAP  10/13/2023  . INFLUENZA VACCINE  Completed  . PNA vac Low Risk Adult  Completed    Cancer Screenings: Lung: Low Dose CT Chest recommended if Age 72-80 years, 30 pack-year currently smoking OR have quit w/in 15years. Patient does not qualify. Colorectal:  No longer required   Additional Screenings:  Hepatitis B/HIV/Syphillis:not indicated Hepatitis C Screening: not indicated    Plan:     I have personally reviewed and addressed the Medicare Annual Wellness questionnaire and have noted the following in the patient's chart:  A. Medical and social history B. Use of alcohol, tobacco or illicit drugs  C. Current medications and supplements D. Functional ability and status E.  Nutritional status F.  Physical activity G. Advance directives H. List of other physicians I.  Hospitalizations, surgeries, and ER visits in previous 12 months J.  [place] such as hearing and vision if needed, cognitive and depression L. Referrals and appointments   In addition, I have reviewed and discussed with patient certain preventive protocols, quality metrics, and best practice recommendations. A written personalized care plan for preventive services as well as general preventive health recommendations were provided to patient.   Signed,  Tyler Aas, LPN Nurse Health Advisor   Nurse Notes:none

## 2017-06-09 NOTE — Patient Instructions (Signed)
Blake Bates , Thank you for taking time to come for your Medicare Wellness Visit. I appreciate your ongoing commitment to your health goals. Please review the following plan we discussed and let me know if I can assist you in the future.   Screening recommendations/referrals: Colonoscopy: completed 05/12/2013, no longer required Recommended yearly ophthalmology/optometry visit for glaucoma screening and checkup Recommended yearly dental visit for hygiene and checkup  Vaccinations: Influenza vaccine: up to date  Pneumococcal vaccine: up to date Tdap vaccine: up to date Shingles vaccine: up to date    Advanced directives: Advance directive discussed with you today. I have provided a copy for you to complete at home and have notarized. Once this is complete please bring a copy in to our office so we can scan it into your chart.  Conditions/risks identified: Recommend drinking at least 6-8 glasses of water a day  Next appointment: Follow up in one year for your annual wellness exam.   Preventive Care 65 Years and Older, Male Preventive care refers to lifestyle choices and visits with your health care provider that can promote health and wellness. What does preventive care include?  A yearly physical exam. This is also called an annual well check.  Dental exams once or twice a year.  Routine eye exams. Ask your health care provider how often you should have your eyes checked.  Personal lifestyle choices, including:  Daily care of your teeth and gums.  Regular physical activity.  Eating a healthy diet.  Avoiding tobacco and drug use.  Limiting alcohol use.  Practicing safe sex.  Taking low doses of aspirin every day.  Taking vitamin and mineral supplements as recommended by your health care provider. What happens during an annual well check? The services and screenings done by your health care provider during your annual well check will depend on your age, overall health,  lifestyle risk factors, and family history of disease. Counseling  Your health care provider may ask you questions about your:  Alcohol use.  Tobacco use.  Drug use.  Emotional well-being.  Home and relationship well-being.  Sexual activity.  Eating habits.  History of falls.  Memory and ability to understand (cognition).  Work and work Statistician. Screening  You may have the following tests or measurements:  Height, weight, and BMI.  Blood pressure.  Lipid and cholesterol levels. These may be checked every 5 years, or more frequently if you are over 33 years old.  Skin check.  Lung cancer screening. You may have this screening every year starting at age 51 if you have a 30-pack-year history of smoking and currently smoke or have quit within the past 15 years.  Fecal occult blood test (FOBT) of the stool. You may have this test every year starting at age 32.  Flexible sigmoidoscopy or colonoscopy. You may have a sigmoidoscopy every 5 years or a colonoscopy every 10 years starting at age 70.  Prostate cancer screening. Recommendations will vary depending on your family history and other risks.  Hepatitis C blood test.  Hepatitis B blood test.  Sexually transmitted disease (STD) testing.  Diabetes screening. This is done by checking your blood sugar (glucose) after you have not eaten for a while (fasting). You may have this done every 1-3 years.  Abdominal aortic aneurysm (AAA) screening. You may need this if you are a current or former smoker.  Osteoporosis. You may be screened starting at age 36 if you are at high risk. Talk with your health care  provider about your test results, treatment options, and if necessary, the need for more tests. Vaccines  Your health care provider may recommend certain vaccines, such as:  Influenza vaccine. This is recommended every year.  Tetanus, diphtheria, and acellular pertussis (Tdap, Td) vaccine. You may need a Td booster  every 10 years.  Zoster vaccine. You may need this after age 41.  Pneumococcal 13-valent conjugate (PCV13) vaccine. One dose is recommended after age 26.  Pneumococcal polysaccharide (PPSV23) vaccine. One dose is recommended after age 54. Talk to your health care provider about which screenings and vaccines you need and how often you need them. This information is not intended to replace advice given to you by your health care provider. Make sure you discuss any questions you have with your health care provider. Document Released: 05/25/2015 Document Revised: 01/16/2016 Document Reviewed: 02/27/2015 Elsevier Interactive Patient Education  2017 [place] Prevention in the Home Falls can cause injuries. They can happen to people of all ages. There are many things you can do to make your home safe and to help prevent falls. What can I do on the outside of my home?  Regularly fix the edges of walkways and driveways and fix any cracks.  Remove anything that might make you trip as you walk through a door, such as a raised step or threshold.  Trim any bushes or trees on the path to your home.  Use bright outdoor lighting.  Clear any walking paths of anything that might make someone trip, such as rocks or tools.  Regularly check to see if handrails are loose or broken. Make sure that both sides of any steps have handrails.  Any raised decks and porches should have guardrails on the edges.  Have any leaves, snow, or ice cleared regularly.  Use sand or salt on walking paths during winter.  Clean up any spills in your garage right away. This includes oil or grease spills. What can I do in the bathroom?  Use night lights.  Install grab bars by the toilet and in the tub and shower. Do not use towel bars as grab bars.  Use non-skid mats or decals in the tub or shower.  If you need to sit down in the shower, use a plastic, non-slip stool.  Keep the floor dry. Clean up any  water that spills on the floor as soon as it happens.  Remove soap buildup in the tub or shower regularly.  Attach bath mats securely with double-sided non-slip rug tape.  Do not have throw rugs and other things on the floor that can make you trip. What can I do in the bedroom?  Use night lights.  Make sure that you have a light by your bed that is easy to reach.  Do not use any sheets or blankets that are too big for your bed. They should not hang down onto the floor.  Have a firm chair that has side arms. You can use this for support while you get dressed.  Do not have throw rugs and other things on the floor that can make you trip. What can I do in the kitchen?  Clean up any spills right away.  Avoid walking on wet floors.  Keep items that you use a lot in easy-to-reach places.  If you need to reach something above you, use a strong step stool that has a grab bar.  Keep electrical cords out of the way.  Do not use floor polish  or wax that makes floors slippery. If you must use wax, use non-skid floor wax.  Do not have throw rugs and other things on the floor that can make you trip. What can I do with my stairs?  Do not leave any items on the stairs.  Make sure that there are handrails on both sides of the stairs and use them. Fix handrails that are broken or loose. Make sure that handrails are as long as the stairways.  Check any carpeting to make sure that it is firmly attached to the stairs. Fix any carpet that is loose or worn.  Avoid having throw rugs at the top or bottom of the stairs. If you do have throw rugs, attach them to the floor with carpet tape.  Make sure that you have a light switch at the top of the stairs and the bottom of the stairs. If you do not have them, ask someone to add them for you. What else can I do to help prevent falls?  Wear shoes that:  Do not have high heels.  Have rubber bottoms.  Are comfortable and fit you well.  Are closed  at the toe. Do not wear sandals.  If you use a stepladder:  Make sure that it is fully opened. Do not climb a closed stepladder.  Make sure that both sides of the stepladder are locked into place.  Ask someone to hold it for you, if possible.  Clearly mark and make sure that you can see:  Any grab bars or handrails.  First and last steps.  Where the edge of each step is.  Use tools that help you move around (mobility aids) if they are needed. These include:  Canes.  Walkers.  Scooters.  Crutches.  Turn on the lights when you go into a dark area. Replace any light bulbs as soon as they burn out.  Set up your furniture so you have a clear path. Avoid moving your furniture around.  If any of your floors are uneven, fix them.  If there are any pets around you, be aware of where they are.  Review your medicines with your doctor. Some medicines can make you feel dizzy. This can increase your chance of falling. Ask your doctor what other things that you can do to help prevent falls. This information is not intended to replace advice given to you by your health care provider. Make sure you discuss any questions you have with your health care provider. Document Released: 02/22/2009 Document Revised: 10/04/2015 Document Reviewed: 06/02/2014 Elsevier Interactive Patient Education  2017 [employer].

## 2017-07-15 LAB — URINALYSIS
Bilirubin (Urine): NEGATIVE
Ketones, UA: NEGATIVE
Leukocytes, UA: NEGATIVE
Nitrite, UA: NEGATIVE
Urobilinogen, UA: NORMAL

## 2017-07-15 LAB — CBC AND DIFFERENTIAL
HCT: 46 (ref 41–53)
Hemoglobin: 15.6 (ref 13.5–17.5)
Neutrophils Absolute: 8
Platelets: 276 (ref 150–399)
WBC: 10.4

## 2017-07-15 LAB — TSH: TSH: 2.83 (ref 0.41–5.90)

## 2017-07-15 LAB — BASIC METABOLIC PANEL
BUN: 20 (ref 4–21)
Creatinine: 1.3 (ref 0.6–1.3)
Glucose: 108
Potassium: 4.3 (ref 3.4–5.3)
Sodium: 137 (ref 137–147)

## 2017-07-15 LAB — HEPATIC FUNCTION PANEL
Alkaline Phosphatase: 80 (ref 25–125)
Bilirubin, Total: 1.8

## 2017-07-20 ENCOUNTER — Encounter: Payer: Self-pay | Admitting: Family Medicine

## 2017-07-20 ENCOUNTER — Ambulatory Visit (INDEPENDENT_AMBULATORY_CARE_PROVIDER_SITE_OTHER): Payer: Medicare Other | Admitting: Family Medicine

## 2017-07-20 VITALS — BP 128/57 | HR 63 | Temp 97.5°F | Resp 16 | Ht 70.0 in | Wt 154.0 lb

## 2017-07-20 DIAGNOSIS — F0390 Unspecified dementia without behavioral disturbance: Secondary | ICD-10-CM

## 2017-07-20 DIAGNOSIS — G919 Hydrocephalus, unspecified: Secondary | ICD-10-CM | POA: Diagnosis not present

## 2017-07-20 DIAGNOSIS — G309 Alzheimer's disease, unspecified: Secondary | ICD-10-CM
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
## 2017-07-20 NOTE — Patient Instructions (Addendum)
Thank you for coming to the office today.  1.  We will do some more testing Vitamin B12 today  Check CD for CT Head results, print copy and see if we can review it  We will try to request record as well  We may need an MRI as next test and Neurology in future  ----------------------------------------------------  We will send referral stay tuned  [facility]   Address: [address] [place], [place] [address] Hours: 8AM-5PM Phone: [phone]  [facility] [place] [place], [place] [address] Phone: [phone]   Please schedule a Follow-up Appointment to: Return in about 4 weeks (around 08/17/2017) for Cognitive Impairment MMSE.    If you have any other questions or concerns, please feel free to call the office or send a message through [place]. You may also schedule an earlier appointment if necessary.  Additionally, you may be receiving a survey about your experience at our office within a few days to 1 week by e-mail or mail. We value your feedback.  [name], DO [place]

## 2017-07-21 ENCOUNTER — Other Ambulatory Visit: Payer: Medicare Other

## 2017-07-21 ENCOUNTER — Encounter: Payer: Self-pay | Admitting: Family Medicine

## 2017-07-21 DIAGNOSIS — G3183 Dementia with Lewy bodies: Secondary | ICD-10-CM

## 2017-07-21 DIAGNOSIS — F02818 Dementia in other diseases classified elsewhere, unspecified severity, with other behavioral disturbance: Secondary | ICD-10-CM | POA: Insufficient documentation

## 2017-07-21 DIAGNOSIS — F0280 Dementia in other diseases classified elsewhere without behavioral disturbance: Secondary | ICD-10-CM | POA: Insufficient documentation

## 2017-07-22 ENCOUNTER — Encounter: Payer: Self-pay | Admitting: Neurology

## 2017-07-22 LAB — VITAMIN B12: Vitamin B-12: 283 pg/mL (ref 200–1100)

## 2017-07-22 NOTE — Addendum Note (Signed)
Addended by: [name] on: 07/22/2017 01:05 PM   Modules accepted: Orders

## 2017-07-22 NOTE — Progress Notes (Signed)
ADDENDUM:  New updates.  1. Patient returned for lab on 3/12 - had Vitamin B12 level 283, normal range, unlikely factor. Already reviewed other labs from outside hospital, abstracted, without clear contributing factor for dementia.  2. Additional resources were printed and given to patient/wife from [employer] with contact information and locations to assist in setting of future cognitive decline/dementia.  3. I have called [facility] to discuss case in triage and reviewed case with Dr Blake Bates. She agreed with referral and understands our concerns, and agreed that given presentation and initial concern of possible progressive Dementia vs Normal Pressure Hydrocephalus as a possibility, we could proceed with MRI Brain w and w/o contrast at this time, and forward her results before the upcoming appointment, anticipated scheduling time for new patient would be within 2 months, approximately May 2019, but could be moved up sooner if cancellation. - Referral to Neurology placed - Ordered MRI Brain - to be scheduled  Patient and wife have been notified of this plan and agree, they will stay tuned for Neuro apt and scheduling MRI.  Orders Placed This Encounter  Procedures  . MR Brain W Wo Contrast    Standing Status:   Future    Standing Expiration Date:   09/22/2018    Order Specific Question:   If indicated for the ordered procedure, I authorize the administration of contrast media per Radiology protocol    Answer:   Yes    Order Specific Question:   What is the patient's sedation requirement?    Answer:   No Sedation    Order Specific Question:   Does the patient have a pacemaker or implanted devices?    Answer:   No    Order Specific Question:   Call Results- Best Contact Number?    Answer:   [phone], additionally may forward results to [facility] Dr Delice Lesch    Order Specific Question:   Radiology Contrast Protocol - do NOT remove file path    Answer:    [email or web address]    Order Specific Question:   Reason for Exam additional comments    Answer:   Worsening dementia, cognitive decline, abnormal Head CT performed outside hospital [place]    Order Specific Question:   Preferred imaging location?    Answer:   [facility] (table limit-300lbs)  . Vitamin B12  . CBC and differential    This external order was created through the Results Console.  . Basic metabolic panel    This external order was created through the Results Console.  . Hepatic function panel    This external order was created through the Results Console.  [name] Urinalysis    This order was created through External Result Entry  . TSH    This external order was created through the Results Console.  . Ambulatory referral to Neurology    Referral Priority:   Routine    Referral Type:   Consultation    Referral Reason:   Specialty Services Required    Referred to Provider:   Cameron Sprang, MD    Requested Specialty:   Neurology    Number of Visits Requested:   1   Blake Bates, [facility] 07/22/2017, 1:04 PM

## 2017-07-24 LAB — VITAMIN B12

## 2017-07-30 ENCOUNTER — Ambulatory Visit
Admission: RE | Admit: 2017-07-30 | Discharge: 2017-07-30 | Disposition: A | Discharge status: Home / Self Care | Payer: Medicare Other | Source: Ambulatory Visit | Attending: Family Medicine | Admitting: Family Medicine

## 2017-07-30 DIAGNOSIS — J329 Chronic sinusitis, unspecified: Secondary | ICD-10-CM | POA: Diagnosis not present

## 2017-07-30 DIAGNOSIS — G919 Hydrocephalus, unspecified: Secondary | ICD-10-CM | POA: Insufficient documentation

## 2017-07-30 DIAGNOSIS — G319 Degenerative disease of nervous system, unspecified: Secondary | ICD-10-CM | POA: Insufficient documentation

## 2017-07-30 DIAGNOSIS — G930 Cerebral cysts: Secondary | ICD-10-CM | POA: Diagnosis not present

## 2017-07-30 DIAGNOSIS — F0390 Unspecified dementia without behavioral disturbance: Secondary | ICD-10-CM | POA: Insufficient documentation

## 2017-07-30 MED ORDER — GADOBENATE DIMEGLUMINE 529 MG/ML IV SOLN
15.0000 mL | Freq: Once | INTRAVENOUS | Status: AC | PRN
  Administered 2017-07-30: 14 mL via INTRAVENOUS

## 2017-08-17 ENCOUNTER — Other Ambulatory Visit: Payer: Self-pay | Admitting: Family Medicine

## 2017-08-17 ENCOUNTER — Ambulatory Visit (INDEPENDENT_AMBULATORY_CARE_PROVIDER_SITE_OTHER): Payer: Medicare Other | Admitting: Family Medicine

## 2017-08-17 ENCOUNTER — Encounter: Payer: Self-pay | Admitting: Family Medicine

## 2017-08-17 VITALS — BP 124/62 | HR 58 | Temp 97.6°F | Resp 16 | Ht 70.0 in | Wt 155.8 lb

## 2017-08-17 DIAGNOSIS — G8929 Other chronic pain: Secondary | ICD-10-CM

## 2017-08-17 DIAGNOSIS — M25551 Pain in right hip: Secondary | ICD-10-CM | POA: Diagnosis not present

## 2017-08-17 DIAGNOSIS — R972 Elevated prostate specific antigen [PSA]: Secondary | ICD-10-CM

## 2017-08-17 DIAGNOSIS — M25561 Pain in right knee: Secondary | ICD-10-CM

## 2017-08-17 DIAGNOSIS — N138 Other obstructive and reflux uropathy: Secondary | ICD-10-CM

## 2017-08-17 DIAGNOSIS — N183 Chronic kidney disease, stage 3 unspecified: Secondary | ICD-10-CM

## 2017-08-17 DIAGNOSIS — M159 Polyosteoarthritis, unspecified: Secondary | ICD-10-CM

## 2017-08-17 DIAGNOSIS — M150 Primary generalized (osteo)arthritis: Secondary | ICD-10-CM

## 2017-08-17 DIAGNOSIS — F0390 Unspecified dementia without behavioral disturbance: Secondary | ICD-10-CM

## 2017-08-17 DIAGNOSIS — F0391 Unspecified dementia with behavioral disturbance: Secondary | ICD-10-CM

## 2017-08-17 DIAGNOSIS — N401 Enlarged prostate with lower urinary tract symptoms: Principal | ICD-10-CM

## 2017-08-17 DIAGNOSIS — E039 Hypothyroidism, unspecified: Secondary | ICD-10-CM

## 2017-08-17 DIAGNOSIS — E7800 Pure hypercholesterolemia, unspecified: Secondary | ICD-10-CM

## 2017-08-17 DIAGNOSIS — M8949 Other hypertrophic osteoarthropathy, multiple sites: Secondary | ICD-10-CM

## 2017-08-17 DIAGNOSIS — E559 Vitamin D deficiency, unspecified: Secondary | ICD-10-CM

## 2017-08-17 DIAGNOSIS — R7309 Other abnormal glucose: Secondary | ICD-10-CM

## 2017-08-17 MED ORDER — MELOXICAM 15 MG PO TABS: 15.0000 mg | ORAL_TABLET | Freq: Every day | ORAL | 1 refills | Status: DC | PRN

## 2017-08-17 NOTE — Assessment & Plan Note (Signed)
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 Also with joint stiffness, seems more related to OA/DJD - Recent MMSE 07/2017 with 23/30 - Clinically consider FAST Stage 2-3 - Less likely normal pressure hydrocephalus - Did not receive outside records from [place] outside hospital - Never on medicine for this  Plan: 1. Reassurance - keep up improved activity, brain stim. Encourage improve exercise walking - Follow-up as planned w/ [facility] Dr Delice Lesch 10/09/17 as new patient, and additional update w/ contact to Dr Delice Lesch today 08/17/17 via EHR, she reviewed his recent MRI, it seems consistent with large amount of atrophy, she also mentioned given arachnoid cysts may consider Neurosurgery eval in future - I can mention to them before her apt and she can discuss as well. Will call patient/wife this week to review this update

## 2017-08-17 NOTE — Assessment & Plan Note (Signed)
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 If need can add breakthrough rx Meloxicam 15mg  daily PRN for 1 week at a time max See A&P OA/DJD - may need ortho in future

## 2017-08-17 NOTE — Patient Instructions (Addendum)
Thank you for coming to the office today.  For arthritis  Concern that this is main cause of your stiffness in joints and limited mobility and some pain.  Try to take anti inflammatory Meloxicam 15mg  (HALF OR WHOLE) with food daily as needed - prefer to ONLY use if bad flare up, maybe every day for 1 week then stop. And can use again in future if need  Primarily focus on increasing Tylenol dose  Recommend to start taking Tylenol Extra Strength 500mg  tabs - take 1 to 2 tabs per dose (max 1000mg ) every 6-8 hours for pain (take regularly, don't skip a dose for next 7 days), max 24 hour daily dose is 6 tablets or 3000mg . In the future you can repeat the same everyday Tylenol course for 1-2 weeks at a time.   It is safe to take together with meloxicam - but if you are on meloxicam, do NOT take Advil, ibuprofen, aleve, naproxen  Next in future we can consider DIclofenac Voltaren topical gel rx if the meloxicam is not effective  ------------ We'll try to reach out to Dr Delice Lesch to see if any other input or changes before your apt - otherwise if you don't here back from me, then you may try reaching their office to make sure you don't need to do anything else at this point.  DUE for FASTING BLOOD WORK (no food or drink after midnight before the lab appointment, only water or coffee without cream/sugar on the morning of)  SCHEDULE "Lab Only" visit in the morning at the clinic for lab draw in 3 MONTHS   - Make sure Lab Only appointment is at about 1 week before your next appointment, so that results will be available  For Lab Results, once available within 2-3 days of blood draw, you can can log in to MyChart online to view your results and a brief explanation. Also, we can discuss results at next follow-up visit.   Please schedule a Follow-up Appointment to: Return in about 3 months (around 11/16/2017) for Annual Physical (lab review, f/u Dementia Neuro, Arthritis).  If you have any other questions  or concerns, please feel free to call the office or send a message through [place]. You may also schedule an earlier appointment if necessary.  Additionally, you may be receiving a survey about your experience at our office within a few days to 1 week by e-mail or mail. We value your feedback.  [name], DO [place]

## 2017-08-17 NOTE — Assessment & Plan Note (Addendum)
Suspected underlying etiology for chronic recent worsening joint stiffness and some pain, mostly knees and hips. 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

## 2017-08-17 NOTE — Progress Notes (Addendum)
Subjective:    Patient ID: Blake Bates, male    DOB: 05-14-1940, 77 y.o.   MRN: [id number]  Blake Bates is a 77 y.o. male presenting on 08/17/2017 for Dementia (follow up) and Arthritis (knees, hip)  Patient is accompanied by wife, [name], who provides additional history.  HPI   FOLLOW-UP DEMENTIA / COGNITIVE IMPAIRMENT - Last visit with me 07/20/17, for initial visit for same problem with new concern for diagnosis, treated with referral to [facility] to Dr Delice Lesch and testing with labs and Vitamin B12 and MRI, see prior notes for background information. - Interval update with reviewed labs unremarkable, MRI showed advanced atrophy consistent with  - Today patient reports no new concerns, he has been doing well without significant change in past 1 month. They have been working on cognitive Brain games and puzzles, and staying active with walking as best he can. He has not had any more episodes of severe confusion or problems that led to this diagnosis. 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 - Interval update with now seems to  have slightly worse recently stiffness limiting his mobility with walking - Today asking about medication dosing Tylenol and NSAIDs - He has history of CKD-III with mild elevated Cr, he is not taking NSAID at this time, but asking if can inc Tylenol dose, taking Ext Str 500mg  x 2 tabs per dose maybe once daily - Denies fall, trauma, injury, joint swelling or redness, other joint pain  PMH - CKD-III  Depression screen [facility] 2/9 06/09/2017 06/20/2016 05/29/2015  Decreased Interest 0 0 0  Down, Depressed, Hopeless 0 0 0  PHQ - 2 Score 0 0 0    Social History   Tobacco Use  . Smoking status: Former Smoker    Packs/day: 0.00    Years: 60.00    Pack years: 0.00    Types: Pipe    Last attempt to quit: 05/12/2017    Years since quitting: 0.2  . Smokeless tobacco: Never Used  . Tobacco comment: Self taper down from full pipe daily down to half to quarter pipe  Substance Use Topics  . Alcohol use: Yes    Alcohol/week: 1.2 oz    Types: 2 Glasses of wine per week  . Drug use: No    Review of Systems Per HPI unless specifically indicated above     Objective:    BP 124/62   Pulse (!) 58   Temp 97.6 F (36.4 C) (Oral)   Resp 16   Ht 5\' 10"  (1.778 m)   Wt 155 lb 12.8 oz (70.7 kg)   BMI 22.35 kg/m   Wt Readings from Last 3 Encounters:  08/17/17 155 lb 12.8 oz (  70.7 kg)  07/20/17 154 lb (69.9 kg)  06/09/17 156 lb 9.6 oz (71 kg)    Physical Exam  Constitutional: He is oriented to person, place, and time. He appears well-developed and well-nourished. No distress.  Well-appearing elderly 77 year old male, comfortable, cooperative  HENT:  Head: Normocephalic and atraumatic.  Mouth/Throat: Oropharynx is clear and moist.  Eyes: Conjunctivae are normal. Right eye exhibits no discharge. Left eye exhibits no discharge.  Neck: Normal range of motion. Neck supple.  Cardiovascular: Normal rate and intact distal pulses.  Pulmonary/Chest: Effort normal.  Musculoskeletal: Normal range of motion.  He exhibits no edema.  Bilateral knees with some bulky appearance and crepitus, some reduced active range of motion. Non tender today.  Neurological: He is alert and oriented to person, place, and time.  Skin: Skin is warm and dry. No rash noted. He is not diaphoretic. No erythema.  Psychiatric: He has a normal mood and affect. His behavior is normal.  Well groomed, good eye contact, normal speech and thoughts. Speech appears improved today, not slowed. Less delay in his thought process.   Good insight into past medical history and longer term recall, short term recall remains poor.  Nursing note and vitals reviewed.  I have personally reviewed the radiology report from 07/30/17 MRI Brain w / wo contrast  CLINICAL DATA:  Hydrocephalus. Dementia without behavioral disturbance. Multiple episodes of confusion over the last 6 months.  EXAM: MRI HEAD WITHOUT AND WITH CONTRAST  TECHNIQUE: Multiplanar, multiecho pulse sequences of the brain and surrounding structures were obtained without and with intravenous contrast.  CONTRAST:  83mL MULTIHANCE GADOBENATE DIMEGLUMINE 529 MG/ML IV SOLN  COMPARISON:  None  FINDINGS: Brain: The diffusion-weighted images demonstrate no acute or subacute infarction. Advanced atrophy is present. Mild white matter changes are noted. 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 There is some mass effect but without significant compression due to the degree of atrophy.  Postcontrast images demonstrate no pathologic enhancement. Brainstem and cerebellum are normal.  No hemorrhage or mass lesion is present. There is other no significant extra-axial fluid collection.  Vascular: Low is present in the major intracranial arteries.  Skull and upper cervical spine: The skull base is within normal limits. The craniocervical junction is normal.  Sinuses/Orbits: The right maxillary sinus is opacified. Anterior ethmoid air cells and right  frontal sinus are also opacified on the right. The remaining paranasal sinuses and mastoid air cells are clear. Bilateral lens replacements are present. Globes and orbits are within normal limits.  IMPRESSION: 1. No acute intracranial abnormality. 2. Advanced atrophy and mild white matter disease. This likely reflects the sequela of chronic microvascular ischemia. 3. Subarachnoid cysts over the convexities bilaterally demonstrates some mass effect without compression. 4. Chronic anterior right paranasal sinus disease.   Electronically Signed   By: San Morelle M.D.   On: 07/30/2017 12:16  MMSE - Mini Mental State Exam 07/20/2017 06/20/2016 05/29/2015  Orientation to time 4 5 5   Orientation to Place 5 5 5   Registration 3 3 3   Attention/ Calculation 2 5 5   Recall 0 3 3  Language- name 2 objects 2 2 2   Language- repeat 1 1 1   Language- follow 3 step command 3 3 3   Language- read & follow direction 1 1 1   Write a sentence 1 1 1   Copy design 1 1 1   Total score 23 30 30       Results for orders placed or performed in visit on 07/20/17  Vitamin B12  Result Value Ref Range   Vitamin B-12 283 200 - 1,100 pg/mL  CBC and differential  Result Value Ref Range   Hemoglobin 15.6 13.5 - 17.5   HCT 46 41 - 53   Neutrophils Absolute 8    Platelets 276 150 - 399   WBC 10.2   Basic metabolic panel  Result Value Ref Range   Glucose 108    BUN 20 4 - 21   Creatinine 1.3 0.6 - 1.3   Potassium 4.3 3.4 - 5.3   Sodium 137 137 - 147  Hepatic function panel  Result Value Ref Range   Alkaline Phosphatase 80 25 - 125   Bilirubin, Total 1.8   Urinalysis  Result Value Ref Range   Color - urine Light Yellow    Urobilinogen, UA Normal Normal   Bilirubin (Urine) Negative    Ketones, UA Negative Negative   Leukocytes, UA Negative (none)   Nitrite, UA Negative (none)  TSH  Result Value Ref Range   TSH 2.83 0.41 - 5.90  Vitamin B12  Result Value Ref Range   Vitamin B-12 CANCELED        Assessment & Plan:   Problem List Items Addressed This Visit    Chronic pain of right knee    Suspected secondary to OA/DJD, known on x-ray Reassurance, improve conservative care with max dose Tylenol Ext Str 1000mg  BID-TID often. If need can add breakthrough rx Meloxicam 15mg  daily PRN for 1 week at a time max See A&P OA/DJD - may need ortho in future      Relevant Medications   meloxicam (MOBIC) 15 MG tablet   Chronic right hip pain    Suspected secondary to OA/DJD, known on x-ray Reassurance, improve conservative care with max dose Tylenol Ext Str 1000mg  BID-TID often. If need can add breakthrough rx Meloxicam 15mg  daily PRN for 1 week at a time max See A&P OA/DJD - may need ortho in future      Relevant Medications   meloxicam (MOBIC) 15 MG tablet   CKD (chronic kidney disease), stage III (HCC)    Stable CKD-III Likely etiology age w/o HTN, DM Limit NSAIDs for OA/DJD - rx meloxicam only 1 week PRN Prefer regular Tylenol dosing up to 1000mg  TID Trend Cr, next lab 3 months at annual 11/2016      Dementia    Stable persistent chronic dementia, remains unclear exact etiology Reviewed recent MRI Brain 07/30/17, w/ advanced atrophy w/o evidence per Radiology of NPH, but some arachnoid cysts seen with some mass effect but w/o compression - Associated symptoms urinary incontinence / LUTS w/ BPH. Also with joint stiffness, seems more related to OA/DJD - Recent MMSE 07/2017 with 23/30 - Clinically consider FAST Stage 2-3 - Less likely normal pressure hydrocephalus - Did not receive outside records from [place] outside hospital - Never on medicine for this  Plan: 1. Reassurance - keep up improved activity, brain stim. Encourage improve exercise walking - Follow-up as planned w/ [facility] Dr Delice Lesch 10/09/17 as new patient, and additional update w/ contact to Dr Delice Lesch today 08/17/17 via EHR, she reviewed his recent MRI, it seems consistent with large amount of atrophy,  she also mentioned given arachnoid cysts may consider Neurosurgery eval in future - I can mention to them before her apt and she can discuss as well. Will call patient/wife this week to review this update      Osteoarthritis of multiple joints - Primary    Suspected  underlying etiology for chronic recent worsening joint stiffness and some pain, mostly knees and hips. 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 meloxicam (MOBIC) 15 MG tablet    Sig: Take 1 tablet (15 mg total) by mouth daily as needed for pain. For arthritis flare, take up to 1 week at a time    Dispense:  30 tablet    Refill:  1     Follow up plan: Return in about 3 months (around 11/16/2017) for Annual Physical (lab review, f/u Dementia Neuro, Arthritis).  Future labs ordered for 11/09/17  UPDATE - 08/18/17 - called patient and spoke with wife, to review Dr Aquino's response regarding the MRI interpretation, and her mention of the arachnoid cysts, see my note above. Patient and wife prefer to wait to discuss further with her as Neurologist, instead of pursuing a Neurosurgery consult at this time. They appreciate the input and follow-up.  [name], [facility] 08/17/2017, 10:49 PM

## 2017-08-17 NOTE — Assessment & Plan Note (Signed)
Stable CKD-III Likely etiology age w/o HTN, DM Limit NSAIDs for OA/DJD - rx meloxicam only 1 week PRN Prefer regular Tylenol dosing up to 1000mg  TID Trend Cr, next lab 3 months at annual 11/2016

## 2017-09-18 ENCOUNTER — Other Ambulatory Visit: Payer: Self-pay | Admitting: Family Medicine

## 2017-09-18 DIAGNOSIS — E039 Hypothyroidism, unspecified: Secondary | ICD-10-CM

## 2017-10-09 ENCOUNTER — Ambulatory Visit: Payer: Medicare Other | Admitting: Neurology

## 2017-10-09 ENCOUNTER — Encounter: Payer: Self-pay | Admitting: Neurology

## 2017-10-09 ENCOUNTER — Other Ambulatory Visit: Payer: Self-pay

## 2017-10-09 ENCOUNTER — Encounter

## 2017-10-09 VITALS — BP 108/62 | HR 63 | Ht 70.0 in | Wt 159.0 lb

## 2017-10-09 DIAGNOSIS — F0390 Unspecified dementia without behavioral disturbance: Secondary | ICD-10-CM

## 2017-10-09 DIAGNOSIS — G930 Cerebral cysts: Secondary | ICD-10-CM

## 2017-10-09 DIAGNOSIS — F03A0 Unspecified dementia, mild, without behavioral disturbance, psychotic disturbance, mood disturbance, and anxiety: Secondary | ICD-10-CM

## 2017-10-09 MED ORDER — DONEPEZIL HCL 10 MG PO TABS: ORAL_TABLET | ORAL | 11 refills | Status: DC
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
## 2017-10-09 NOTE — Patient Instructions (Addendum)
1. Schedule routine EEG 2. Schedule Neurocognitive testing with Dr. Si Raider 3. Start Aricept 10mg : take 1/2 tablet daily for 2 weeks, then increase to 1 tablet daily 4. We will plan to repeat MRI brain with and without contrast in September 2019  We have sent a referral to [facility] for your MRI and they will call you directly to schedule your appt. They are located at [place]. If you need to contact them directly please call [phone].   5. Follow-up in 6 months, call for any changes  FALL PRECAUTIONS: Be cautious when walking. Scan the area for obstacles that may increase the risk of trips and falls. When getting up in the mornings, sit up at the edge of the bed for a few minutes before getting out of bed. Consider elevating the bed at the head end to avoid drop of blood pressure when getting up. Walk always in a well-lit room (use night lights in the walls). Avoid area rugs or power cords from appliances in the middle of the walkways. Use a walker or a cane if necessary and consider physical therapy for balance exercise. Get your eyesight checked regularly.  FINANCIAL OVERSIGHT: Supervision, especially oversight when making financial decisions or transactions is also recommended.  HOME SAFETY: Consider the safety of the kitchen when operating appliances like stoves, microwave oven, and blender. Consider having supervision and share cooking responsibilities until no longer able to participate in those. Accidents with firearms and other hazards in the house should be identified and addressed as well.  DRIVING: Regarding driving, in patients with progressive memory problems, driving will be impaired. We advise to have someone else do the driving if trouble finding directions or if minor accidents are reported. Independent driving assessment is available to determine safety of driving.  ABILITY TO BE LEFT ALONE: If patient is unable to contact 911 operator, consider using  LifeLine, or when the need is there, arrange for someone to stay with patients. Smoking is a fire hazard, consider supervision or cessation. Risk of wandering should be assessed by caregiver and if detected at any point, supervision and safe proof recommendations should be instituted.  MEDICATION SUPERVISION: Inability to self-administer medication needs to be constantly addressed. Implement a mechanism to ensure safe administration of the medications.  RECOMMENDATIONS FOR ALL PATIENTS WITH MEMORY PROBLEMS: 1. Continue to exercise (Recommend 30 minutes of walking everyday, or 3 hours every week) 2. Increase social interactions - continue going to [place] and enjoy social gatherings with friends and family 3. Eat healthy, avoid fried foods and eat more fruits and vegetables 4. Maintain adequate blood pressure, blood sugar, and blood cholesterol level. Reducing the risk of stroke and cardiovascular disease also helps promoting better memory. 5. Avoid stressful situations. Live a simple life and avoid aggravations. Organize your time and prepare for the next day in anticipation. 6. Sleep well, avoid any interruptions of sleep and avoid any distractions in the bedroom that may interfere with adequate sleep quality 7. Avoid sugar, avoid sweets as there is a strong link between excessive sugar intake, diabetes, and cognitive impairment We discussed the Mediterranean diet, which has been shown to help patients reduce the risk of progressive memory disorders and reduces cardiovascular risk. This includes eating fish, eat fruits and green leafy vegetables, nuts like almonds and hazelnuts, walnuts, and also use olive oil. Avoid fast foods and fried foods as much as possible. Avoid sweets and sugar as sugar use has been linked to worsening of memory function.  There is always a concern of gradual progression of memory problems. If this is the case, then we may need to adjust level of care according to patient  needs. Support, both to the patient and caregiver, should then be put into place.

## 2017-10-12 ENCOUNTER — Ambulatory Visit (INDEPENDENT_AMBULATORY_CARE_PROVIDER_SITE_OTHER): Payer: Medicare Other | Admitting: Neurology

## 2017-10-12 DIAGNOSIS — G930 Cerebral cysts: Secondary | ICD-10-CM

## 2017-10-12 DIAGNOSIS — F03A0 Unspecified dementia, mild, without behavioral disturbance, psychotic disturbance, mood disturbance, and anxiety: Secondary | ICD-10-CM

## 2017-10-12 DIAGNOSIS — F0390 Unspecified dementia without behavioral disturbance: Secondary | ICD-10-CM | POA: Diagnosis not present
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
## 2017-11-03 ENCOUNTER — Telehealth: Payer: Self-pay

## 2017-11-03 NOTE — Telephone Encounter (Signed)
-----   Message from Cameron Sprang, MD sent at 10/30/2017  4:03 PM EDT ----- Pls let patient/wife know the EEG was normal. Thanks

## 2017-11-03 NOTE — Telephone Encounter (Signed)
LMOM for pt's wife, [name], relaying message below.

## 2017-11-09 ENCOUNTER — Other Ambulatory Visit: Payer: Medicare Other

## 2017-11-09 DIAGNOSIS — N183 Chronic kidney disease, stage 3 unspecified: Secondary | ICD-10-CM

## 2017-11-09 DIAGNOSIS — F0391 Unspecified dementia with behavioral disturbance: Secondary | ICD-10-CM

## 2017-11-09 DIAGNOSIS — E039 Hypothyroidism, unspecified: Secondary | ICD-10-CM

## 2017-11-09 DIAGNOSIS — E559 Vitamin D deficiency, unspecified: Secondary | ICD-10-CM

## 2017-11-09 DIAGNOSIS — R972 Elevated prostate specific antigen [PSA]: Secondary | ICD-10-CM

## 2017-11-09 DIAGNOSIS — R7309 Other abnormal glucose: Secondary | ICD-10-CM

## 2017-11-09 DIAGNOSIS — E7800 Pure hypercholesterolemia, unspecified: Secondary | ICD-10-CM

## 2017-11-09 DIAGNOSIS — N138 Other obstructive and reflux uropathy: Secondary | ICD-10-CM

## 2017-11-09 DIAGNOSIS — N401 Enlarged prostate with lower urinary tract symptoms: Secondary | ICD-10-CM

## 2017-11-10 LAB — COMPLETE METABOLIC PANEL WITH GFR
AG Ratio: 1.6 (calc) (ref 1.0–2.5)
ALT: 10 U/L (ref 9–46)
AST: 18 U/L (ref 10–35)
Albumin: 4.2 g/dL (ref 3.6–5.1)
Alkaline phosphatase (APISO): 70 U/L (ref 40–115)
BUN/Creatinine Ratio: 13 (calc) (ref 6–22)
BUN: 17 mg/dL (ref 7–25)
CO2: 29 mmol/L (ref 20–32)
Calcium: 9.2 mg/dL (ref 8.6–10.3)
Chloride: 101 mmol/L (ref 98–110)
Creat: 1.31 mg/dL — ABNORMAL HIGH (ref 0.70–1.18)
GFR, Est African American: 60 mL/min/{1.73_m2} (ref >=60)
GFR, Est Non African American: 52 mL/min/{1.73_m2} — ABNORMAL LOW (ref >=60)
Globulin: 2.6 g/dL (calc) (ref 1.9–3.7)
Glucose, Bld: 89 mg/dL (ref 65–99)
Potassium: 4.1 mmol/L (ref 3.5–5.3)
Sodium: 138 mmol/L (ref 135–146)
Total Bilirubin: 1.1 mg/dL (ref 0.2–1.2)
Total Protein: 6.8 g/dL (ref 6.1–8.1)

## 2017-11-10 LAB — LIPID PANEL
Cholesterol: 189 mg/dL (ref <200)
HDL: 72 mg/dL (ref >40)
LDL Cholesterol (Calc): 100 mg/dL (calc) — ABNORMAL HIGH
Non-HDL Cholesterol (Calc): 117 mg/dL (calc) (ref <130)
Total CHOL/HDL Ratio: 2.6 (calc) (ref <5.0)
Triglycerides: 81 mg/dL (ref <150)

## 2017-11-10 LAB — TSH: TSH: 2.07 mIU/L (ref 0.40–4.50)

## 2017-11-10 LAB — CBC WITH DIFFERENTIAL/PLATELET
Basophils Absolute: 39 cells/uL (ref 0–200)
Basophils Relative: 0.5 %
Eosinophils Absolute: 100 cells/uL (ref 15–500)
Eosinophils Relative: 1.3 %
HCT: 43.2 % (ref 38.5–50.0)
Hemoglobin: 14.4 g/dL (ref 13.2–17.1)
Lymphs Abs: 1394 cells/uL (ref 850–3900)
MCH: 30.6 pg (ref 27.0–33.0)
MCHC: 33.3 g/dL (ref 32.0–36.0)
MCV: 91.9 fL (ref 80.0–100.0)
MPV: 10.8 fL (ref 7.5–12.5)
Monocytes Relative: 9.6 %
Neutro Abs: 5429 cells/uL (ref 1500–7800)
Neutrophils Relative %: 70.5 %
Platelets: 228 10*3/uL (ref 140–400)
RBC: 4.7 10*6/uL (ref 4.20–5.80)
RDW: 12.6 % (ref 11.0–15.0)
Total Lymphocyte: 18.1 %
WBC mixed population: 739 cells/uL (ref 200–950)
WBC: 7.7 10*3/uL (ref 3.8–10.8)

## 2017-11-10 LAB — REFLEX PSA, FREE
PSA, % Free: 13 % (calc) — ABNORMAL LOW (ref >25)
PSA, Free: 0.9 ng/mL

## 2017-11-10 LAB — VITAMIN D 25 HYDROXY (VIT D DEFICIENCY, FRACTURES): Vit D, 25-Hydroxy: 22 ng/mL — ABNORMAL LOW (ref 30–100)

## 2017-11-10 LAB — PSA, TOTAL WITH REFLEX TO PSA, FREE: PSA, Total: 6.7 ng/mL — ABNORMAL HIGH (ref <=4.0)

## 2017-11-10 LAB — T4, FREE: Free T4: 1.3 ng/dL (ref 0.8–1.8)

## 2017-11-10 LAB — HEMOGLOBIN A1C
Hgb A1c MFr Bld: 5.3 % of total Hgb (ref <5.7)
Mean Plasma Glucose: 105 (calc)
eAG (mmol/L): 5.8 (calc)

## 2017-11-16 ENCOUNTER — Ambulatory Visit (INDEPENDENT_AMBULATORY_CARE_PROVIDER_SITE_OTHER): Payer: Medicare Other | Admitting: Family Medicine

## 2017-11-16 ENCOUNTER — Encounter: Payer: Self-pay | Admitting: Family Medicine

## 2017-11-16 ENCOUNTER — Other Ambulatory Visit: Payer: Self-pay | Admitting: Family Medicine

## 2017-11-16 VITALS — BP 148/64 | HR 60 | Resp 16 | Ht 70.0 in | Wt 154.0 lb

## 2017-11-16 DIAGNOSIS — E782 Mixed hyperlipidemia: Secondary | ICD-10-CM | POA: Diagnosis not present

## 2017-11-16 DIAGNOSIS — R030 Elevated blood-pressure reading, without diagnosis of hypertension: Secondary | ICD-10-CM | POA: Diagnosis not present

## 2017-11-16 DIAGNOSIS — N401 Enlarged prostate with lower urinary tract symptoms: Secondary | ICD-10-CM | POA: Diagnosis not present

## 2017-11-16 DIAGNOSIS — Z0000 Encounter for general adult medical examination without abnormal findings: Secondary | ICD-10-CM

## 2017-11-16 DIAGNOSIS — M25551 Pain in right hip: Secondary | ICD-10-CM | POA: Diagnosis not present

## 2017-11-16 DIAGNOSIS — E559 Vitamin D deficiency, unspecified: Secondary | ICD-10-CM

## 2017-11-16 DIAGNOSIS — M25561 Pain in right knee: Secondary | ICD-10-CM

## 2017-11-16 DIAGNOSIS — N183 Chronic kidney disease, stage 3 unspecified: Secondary | ICD-10-CM

## 2017-11-16 DIAGNOSIS — G8929 Other chronic pain: Secondary | ICD-10-CM | POA: Diagnosis not present

## 2017-11-16 DIAGNOSIS — R972 Elevated prostate specific antigen [PSA]: Secondary | ICD-10-CM

## 2017-11-16 DIAGNOSIS — M150 Primary generalized (osteo)arthritis: Secondary | ICD-10-CM

## 2017-11-16 DIAGNOSIS — F0390 Unspecified dementia without behavioral disturbance: Secondary | ICD-10-CM | POA: Diagnosis not present

## 2017-11-16 DIAGNOSIS — M159 Polyosteoarthritis, unspecified: Secondary | ICD-10-CM

## 2017-11-16 DIAGNOSIS — M8949 Other hypertrophic osteoarthropathy, multiple sites: Secondary | ICD-10-CM

## 2017-11-16 DIAGNOSIS — N138 Other obstructive and reflux uropathy: Secondary | ICD-10-CM

## 2017-11-16 MED ORDER — MELOXICAM 15 MG PO TABS: 15.0000 mg | ORAL_TABLET | Freq: Every day | ORAL | 1 refills | Status: DC | PRN

## 2017-11-16 NOTE — Assessment & Plan Note (Signed)
Controlled cholesterol on lifestyle Never on Statin Last lipid panel 11/2016 Calculated ASCVD 10 yr risk score >25% primarily based on age, similar to baseline risk  Plan: 1. Discussed ASCVD risk reduction strategy - continue ASA 81mg  for primary ASCVD risk reduction - Defer statin therapy in addition for now given dementia work-up avoiding new meds 2. Encourage improved lifestyle - low carb/cholesterol, reduce portion size, continue improving regular exercise

## 2017-11-16 NOTE — Assessment & Plan Note (Signed)
Stable CKD-III, last Cr unchanged Likely etiology age w/o HTN, DM Limit NSAIDs for OA/DJD - agree to refill Meloxicam proceed with caution, intermittent use advised Prefer regular Tylenol dosing up to 1000mg  TID Follow-up Cr trend in 3 months with next lab

## 2017-11-16 NOTE — Assessment & Plan Note (Signed)
Suspected underlying etiology for chronic worsening joint stiffness and some pain, mostly knees and hips. 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 Limited by CKD-III  Plan - Discussion on NSAID therapy - agree to refill Meloxicam, review caution with CKD, advised given multiple areas of pain and effectiveness of meloxicam, agree to try taking this in intervals 1-2 weeks at a time then may reduce dose to HALF pill 7.5 or can hold for 1-2 weeks if able - Refilled rx for 90 day supply, may adjust further in future, he needs it to function - We may consider future topical diclofenac NSAID vs Tramadol if needed - but defer any major medication changes until at least 6 more months with neuro work-up testing for dementia - Future would offer PT, steroid injections, and referral to Ortho if need

## 2017-11-16 NOTE — Assessment & Plan Note (Signed)
Further increased PSA from 4.2 up to 6.7 in 1 year, without significant symptom change - Last DRE moderately enlarged (10/2016) - Clinically improved BPH symptoms now with better hydration - Fam history prostate CA paternal grandfather age 77  Plan: 1. Reviewed prostate cancer screening guidelines and risks including potential prostate biopsy if abnormal PSA - discussed range of concern for PSA - Mutual agreement to re-check PSA in 3 months instead of waiting 6-12 months, then will f/u based on result if persistent >5 would recommend Urology consultation - will contact patient

## 2017-11-16 NOTE — Assessment & Plan Note (Addendum)
Mild low Vit D 22 Start OTC Vitamin D3 5,000 iu daily for 12 weeks then reduce to OTC Vitamin D3 2,000 iu daily for maintenance Recheck Vitamin D in 3 months

## 2017-11-16 NOTE — Progress Notes (Signed)
Subjective:    Patient ID: Blake Bates, male    DOB: Mar 28, 1941, 77 y.o.   MRN: [id number]  Blake Bates is a 77 y.o. male presenting on 11/16/2017 for Annual Exam and Back Pain (refill meloxicam)   Patient is accompanied by wife, [name], who provides additional history.   HPI  Here for Annual Physical and Lab Review  FOLLOW-UP DEMENTIA / COGNITIVE IMPAIRMENT - Last visit with me 08/2017, for same problem, see prior notes for background information. - Interval update with has established with Dr Delice Lesch [facility] Neurology) on 10/09/17, they reviewed his previous MRI Brain with advanced atrophy they thought the arachnoid cysts were not significant but would follow-up comparison MRI in 6 months, additionally EEG was done and resulted to be normal, he was started on Donepezil (Aricept) and also scheduled for neurocognitive testing in 02/2018 and future follow-up by end of year. - Today patient reports overall doing well and without significant changes or new concerns. They are very pleased with Dr Delice Lesch and her office and look forward to future follow-up - He is now taking Donepezil (Aricept) 10mg  daily - they initially started 1 pill on vacation and he got diarrhea, therefore they stopped and now reduced to half pill for 1 week and now back up to 1 pill for past week doing well. 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 - Interval update with seems to be relatively stable when taking NSAID Meloxicam - Asking about refill on meloxicam, it does help knee and hip and back pain when on it, taking most every day, not taking as much Tylenol - He is  keeping track of steps with Pedometer tries to get >1k steps a day  CKD-III Recent lab Cr 1.3, similar to previous, without significant change See above, taking NSAID Improved hydration  Elevated BP without dx HTN Chart review shows prior normal readings of BP. They have cuff at home can monitor now more. Not taking anti HTN medications  Vitamin D Deficiency Last lab showed reduced at 22. He has increased dose up to 5000 Vit D3 daily now for 12 weeks  Health Maintenance:  Prostate CA Screening: Prior PSA 4.2 (11/2016) and previous DRE moderately enlarged 10/2016. Last PSA 6.7 (11/2017). Currently stable BPH mostly with reduced urinary frequency after stopping drinking beer and alcohol. Reduced nocturia. Fam history of prostate cancer paternal grandfather age 68   Depression screen PHQ 2/9 11/16/2017 06/09/2017 06/20/2016  Decreased Interest 0 0 0  Down, Depressed, Hopeless 0 0 0  PHQ - 2 Score 0 0 0  Altered sleeping 0 - -  Tired, decreased energy 0 - -  Change in appetite 0 - -  Feeling bad or failure about yourself  0 - -  Trouble concentrating 0 - -  Moving slowly or fidgety/restless 0 - -  Suicidal thoughts 0 - -  PHQ-9 Score 0 - -    Past Medical History:  Diagnosis Date  . Asthma   . Diverticulosis   . Full dentures   . Hyperlipidemia   . Hypothyroidism    s/p subtotal thyroidectomy  . Muscle spasms of lower extremity    Right  upper leg  . Osteoporosis   . Rhinitis, allergic    pollens, mold, animal dander   Past Surgical History:  Procedure Laterality Date  . CATARACT EXTRACTION W/PHACO Left 09/20/2014   Procedure: CATARACT EXTRACTION PHACO AND INTRAOCULAR LENS PLACEMENT (IOC);  Surgeon: Leandrew Koyanagi, MD;  Location: [facility];  Service: Ophthalmology;  Laterality: Left;  . COLONOSCOPY    . PARATHYROIDECTOMY    . THYROID SURGERY     subtotal   Social History   Socioeconomic History  . Marital status: Married    Spouse name: Not on file  .  Number of children: Not on file  . Years of education: Not on file  . Highest education level: Not on file  Occupational History  . Not on file  Social Needs  . Financial resource strain: Not hard at all  . Food insecurity:    Worry: Never true    Inability: Never true  . Transportation needs:    Medical: No    Non-medical: No  Tobacco Use  . Smoking status: Former Smoker    Packs/day: 0.00    Years: 60.00    Pack years: 0.00    Types: Pipe    Last attempt to quit: 05/12/2017    Years since quitting: 0.5  . Smokeless tobacco: Never Used  . Tobacco comment: Self taper down from full pipe daily down to half to quarter pipe  Substance and Sexual Activity  . Alcohol use: Yes    Alcohol/week: 1.2 oz    Types: 2 Glasses of wine per week  . Drug use: No  . Sexual activity: Not on file  Lifestyle  . Physical activity:    Days per week: 0 days    Minutes per session: 0 min  . Stress: Not at all  Relationships  . Social connections:    Talks on phone: More than three times a week    Gets together: More than three times a week    Attends religious service: More than 4 times per year    Active member of club or organization: Yes    Attends meetings of clubs or organizations: More than 4 times per year    Relationship status: Married  . Intimate partner violence:    Fear of current or ex partner: No    Emotionally abused: No    Physically abused: No    Forced sexual activity: No  Other Topics Concern  . Not on file  Social History Narrative   Clubs- [employer]       Pt lives in 1 story home with his wife   Has 2 surviving children   PhD in Biology   Retired professor   Family History  Problem Relation Age of Onset  . Stroke Mother 37  . Lung cancer Father 19  . Prostate cancer Paternal Grandfather 45  . Stroke Paternal Aunt   . Stroke Maternal Grandfather    Current Outpatient Medications on File Prior to Visit  Medication Sig  . albuterol (PROVENTIL  HFA;VENTOLIN HFA) 108 (90 BASE) MCG/ACT inhaler Inhale into the lungs every 6 (six) hours as needed for wheezing or shortness of breath.  [name] aspirin EC 81 MG tablet Take 1 tablet (81 mg total) by mouth daily.  . Cholecalciferol (VITAMIN D-3 PO) Take 5,000 Units/oz/day by mouth daily. PM   . Cyanocobalamin (VITAMIN B-12) 5000 MCG SUBL Place under the tongue daily.  [name] donepezil (ARICEPT) 10 MG tablet Take 1/2 tablet daily for 2  weeks, then increase to 1 tablet daily (Patient taking differently: Take 10 mg by mouth. )  . levothyroxine (SYNTHROID, LEVOTHROID) 50 MCG tablet TAKE 1 TABLET(50 MCG) BY MOUTH DAILY  . loratadine (CLARITIN) 10 MG tablet Take 10 mg by mouth daily as needed for allergies. PM   No current facility-administered medications on file prior to visit.     Review of Systems  Constitutional: Negative for activity change, appetite change, chills, diaphoresis, fatigue and fever.  HENT: Negative for congestion and hearing loss.   Eyes: Negative for visual disturbance.  Respiratory: Negative for apnea, cough, choking, chest tightness, shortness of breath and wheezing.   Cardiovascular: Negative for chest pain, palpitations and leg swelling.  Gastrointestinal: Negative for abdominal pain, anal bleeding, blood in stool, constipation, diarrhea, nausea and vomiting.  Endocrine: Negative for cold intolerance.  Genitourinary: Negative for decreased urine volume, difficulty urinating, dysuria, frequency and hematuria.  Musculoskeletal: Positive for arthralgias and back pain. Negative for neck pain.  Skin: Negative for rash.  Allergic/Immunologic: Negative for environmental allergies.  Neurological: Negative for dizziness, weakness, light-headedness, numbness and headaches.  Hematological: Negative for adenopathy.  Psychiatric/Behavioral: Negative for behavioral problems, dysphoric mood and sleep disturbance. The patient is not nervous/anxious.    Per HPI unless specifically indicated  above     Objective:    BP (!) 148/64 (BP Location: Left Arm, Cuff Size: Normal)   Pulse 60   Resp 16   Ht 5\' 10"  (1.778 m)   Wt 154 lb (69.9 kg)   SpO2 100%   BMI 22.10 kg/m   Wt Readings from Last 3 Encounters:  11/16/17 154 lb (69.9 kg)  10/09/17 159 lb (72.1 kg)  08/17/17 155 lb 12.8 oz (70.7 kg)    Physical Exam  Constitutional: He is oriented to person, place, and time. He appears well-developed and well-nourished. No distress.  Well-appearing elderly 77 year old male, comfortable, cooperative, thin appearing  HENT:  Head: Normocephalic and atraumatic.  Mouth/Throat: Oropharynx is clear and moist.  Eyes: Pupils are equal, round, and reactive to light. Conjunctivae and EOM are normal. Right eye exhibits no discharge. Left eye exhibits no discharge.  Neck: Normal range of motion. Neck supple. No thyromegaly present.  Cardiovascular: Normal rate, regular rhythm, normal heart sounds and intact distal pulses.  No murmur heard. Pulmonary/Chest: Effort normal and breath sounds normal. No respiratory distress. He has no wheezes. He has no rales.  Abdominal: Soft. Bowel sounds are normal. He exhibits no distension and no mass. There is no tenderness.  Musculoskeletal: Normal range of motion. He exhibits no edema or tenderness.  Upper / Lower Extremities: - Normal muscle tone, strength bilateral upper extremities 5/5, lower extremities 5/5  Lymphadenopathy:    He has no cervical adenopathy.  Neurological: He is alert and oriented to person, place, and time.  Distal sensation intact to light touch all extremities  Skin: Skin is warm and dry. No rash noted. He is not diaphoretic. No erythema.  Psychiatric: He has a normal mood and affect. His behavior is normal.  Well groomed, good eye contact, normal speech and thoughts. Follows conversational well and seems more able to participate in conversation.  Nursing note and vitals reviewed.  Results for orders placed or performed in  visit on 11/09/17  T4, free  Result Value Ref Range   Free T4 1.3 0.8 - 1.8 ng/dL  TSH  Result Value Ref Range   TSH 2.07 0.40 - 4.50 mIU/L  PSA, Total with Reflex to PSA, Free  Result Value Ref Range   PSA, Total 6.7 (H) < OR = 4.0 ng/mL  Lipid panel  Result Value Ref Range   Cholesterol 189 <200 mg/dL   HDL 72 >40 mg/dL   Triglycerides 81 <150 mg/dL   LDL Cholesterol (Calc) 100 (H) mg/dL (calc)   Total CHOL/HDL Ratio 2.6 <5.0 (calc)   Non-HDL Cholesterol (Calc) 117 <130 mg/dL (calc)  COMPLETE METABOLIC PANEL WITH GFR  Result Value Ref Range   Glucose, Bld 89 65 - 99 mg/dL   BUN 17 7 - 25 mg/dL   Creat 1.31 (H) 0.70 - 1.18 mg/dL   GFR, Est Non African American 52 (L) > OR = 60 mL/min/1.33m2   GFR, Est African American 60 > OR = 60 mL/min/1.34m2   BUN/Creatinine Ratio 13 6 - 22 (calc)   Sodium 138 135 - 146 mmol/L   Potassium 4.1 3.5 - 5.3 mmol/L   Chloride 101 98 - 110 mmol/L   CO2 29 20 - 32 mmol/L   Calcium 9.2 8.6 - 10.3 mg/dL   Total Protein 6.8 6.1 - 8.1 g/dL   Albumin 4.2 3.6 - 5.1 g/dL   Globulin 2.6 1.9 - 3.7 g/dL (calc)   AG Ratio 1.6 1.0 - 2.5 (calc)   Total Bilirubin 1.1 0.2 - 1.2 mg/dL   Alkaline phosphatase (APISO) 70 40 - 115 U/L   AST 18 10 - 35 U/L   ALT 10 9 - 46 U/L  VITAMIN D 25 Hydroxy (Vit-D Deficiency, Fractures)  Result Value Ref Range   Vit D, 25-Hydroxy 22 (L) 30 - 100 ng/mL  CBC with Differential/Platelet  Result Value Ref Range   WBC 7.7 3.8 - 10.8 Thousand/uL   RBC 4.70 4.20 - 5.80 Million/uL   Hemoglobin 14.4 13.2 - 17.1 g/dL   HCT 43.2 38.5 - 50.0 %   MCV 91.9 80.0 - 100.0 fL   MCH 30.6 27.0 - 33.0 pg   MCHC 33.3 32.0 - 36.0 g/dL   RDW 12.6 11.0 - 15.0 %   Platelets 228 140 - 400 Thousand/uL   MPV 10.8 7.5 - 12.5 fL   Neutro Abs 5,429 1,500 - 7,800 cells/uL   Lymphs Abs 1,394 850 - 3,900 cells/uL   WBC mixed population 739 200 - 950 cells/uL   Eosinophils Absolute 100 15 - 500 cells/uL   Basophils Absolute 39 0 - 200 cells/uL    Neutrophils Relative % 70.5 %   Total Lymphocyte 18.1 %   Monocytes Relative 9.6 %   Eosinophils Relative 1.3 %   Basophils Relative 0.5 %  Hemoglobin A1c  Result Value Ref Range   Hgb A1c MFr Bld 5.3 <5.7 % of total Hgb   Mean Plasma Glucose 105 (calc)   eAG (mmol/L) 5.8 (calc)  reflex PSA, Free  Result Value Ref Range   PSA, Free 0.9 ng/mL   PSA, % Free 13 (L) >25 % (calc)      Assessment & Plan:   Problem List Items Addressed This Visit    BPH with obstruction/lower urinary tract symptoms    Stable BPH LUTS without worsening Remains off Flomax Now with concern elevated PSA - see A&P - repeat PSA 3 months      Chronic pain of right knee   Relevant Medications   meloxicam (MOBIC) 15 MG tablet   Chronic right hip pain   Relevant Medications   meloxicam (MOBIC) 15 MG tablet   CKD (chronic kidney disease), stage III (HCC)    Stable CKD-III, last  Cr unchanged Likely etiology age w/o HTN, DM Limit NSAIDs for OA/DJD - agree to refill Meloxicam proceed with caution, intermittent use advised Prefer regular Tylenol dosing up to 1000mg  TID Follow-up Cr trend in 3 months with next lab      Dementia    Stable persistent chronic dementia without recent worsening Suspect too early to tell if improved on Donezpeil 10mg  daily now Followed by [facility] Neurology Dr Delice Lesch Last test EEG negative Follow-up as planned with neuropsych testing 02/2018 through [facility] neuro and then upcoming repeat MRI and office visit with Dr Delice Lesch again for determine progress and monitoring      Elevated PSA    Further increased PSA from 4.2 up to 6.7 in 1 year, without significant symptom change - Last DRE moderately enlarged (10/2016) - Clinically improved BPH symptoms now with better hydration - Fam history prostate CA paternal grandfather age 64  Plan: 1. Reviewed prostate cancer screening guidelines and risks including potential prostate biopsy if abnormal PSA - discussed range of concern for  PSA - Mutual agreement to re-check PSA in 3 months instead of waiting 6-12 months, then will f/u based on result if persistent >5 would recommend Urology consultation - will contact patient      Hyperlipidemia    Controlled cholesterol on lifestyle Never on Statin Last lipid panel 11/2016 Calculated ASCVD 10 yr risk score >25% primarily based on age, similar to baseline risk  Plan: 1. Discussed ASCVD risk reduction strategy - continue ASA 81mg  for primary ASCVD risk reduction - Defer statin therapy in addition for now given dementia work-up avoiding new meds 2. Encourage improved lifestyle - low carb/cholesterol, reduce portion size, continue improving regular exercise      Osteoarthritis of multiple joints    Suspected underlying etiology for chronic worsening joint stiffness and some pain, mostly knees and hips. 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 Limited by CKD-III  Plan - Discussion on NSAID therapy - agree to refill Meloxicam, review caution with CKD, advised given multiple areas of pain and effectiveness of meloxicam, agree to try taking this in intervals 1-2 weeks at a time then may reduce dose to HALF pill 7.5 or can hold for 1-2 weeks if able - Refilled rx for 90 day supply, may adjust further in future, he needs it to function - We may consider future topical diclofenac NSAID vs Tramadol if needed - but defer any major medication changes until at least 6 more months with neuro work-up testing for dementia - Future would offer PT, steroid injections, and referral to Ortho if need      Relevant Medications   meloxicam (MOBIC) 15 MG tablet   Vitamin D deficiency    Mild low Vit D 22 Start OTC Vitamin D3 5,000 iu daily for 12 weeks then reduce to OTC Vitamin D3 2,000 iu daily for maintenance Recheck Vitamin D in 3 months       Other Visit Diagnoses    Annual physical exam    -  Primary Updated Health Maintenance information Reviewed  recent lab results with patient Encouraged improvement to lifestyle with diet and exercise    Elevated BP without diagnosis of hypertension     Concern elevated BP today, previously normal Will monitor BP outside office for nwo Follow-up 3-6 months sooner if elevated >150/90      Meds ordered this encounter  Medications  . meloxicam (MOBIC) 15 MG tablet    Sig: Take 1 tablet (15  mg total) by mouth daily as needed for pain. For arthritis flare, take up to 1-2 week at a time    Dispense:  90 tablet    Refill:  1    Follow up plan: Return in about 3 months (around 02/16/2018) for fasting lab only (not office visit at this time).  Future labs ordered for 02/22/18 - BMET, Vitamin D, PSA  [name], DO [facility] 11/16/2017, 3:42 PM

## 2017-11-16 NOTE — Patient Instructions (Addendum)
Thank you for coming to the office today.  Keep appointments with [facility] Neurology as planned, I think this is a great plan that you have and we can stay tuned.  Continue Donepezil as you are, try to figure out what activities this helps with or what functions it helps with.  Elevated Blood Pressure - do not want to add other medicines today - but if persistent high BP >150 - then contact office and we can consider a new start medicine in future and new diagnosis Hypertension  Meloxicam 15mg  - refilled - can REDUCE how often you take this to preserve kidney function. Recommend taking daily for 1-2 weeks at a time, then may reduce and hold for 1-2 weeks. In future if truly need can try daily -or can even try HALF pill  DUE for FASTING BLOOD WORK (no food or drink after midnight before the lab appointment, only water or coffee without cream/sugar on the morning of)  SCHEDULE "Lab Only" visit in the morning at the clinic for lab draw in 3 MONTHS   For Lab Results, once available within 2-3 days of blood draw, you can can log in to MyChart online to view your results and a brief explanation. Also, we can discuss results at next follow-up visit.  BMET + VITAMIND + PSA testing  WILL CALL YOU ABOUT 1 WEEK OR LESS AFTER RESULTS - Determine what to do with Vitamin D and PSA possibly referral to Urology  Please schedule a Follow-up Appointment to: Return in about 3 months (around 02/16/2018) for fasting lab only (not office visit at this time).  If you have any other questions or concerns, please feel free to call the office or send a message through [place]. You may also schedule an earlier appointment if necessary.  Additionally, you may be receiving a survey about your experience at our office within a few days to 1 week by e-mail or mail. We value your feedback.  [name], DO [facility]

## 2017-11-16 NOTE — Assessment & Plan Note (Signed)
Stable BPH LUTS without worsening Remains off Flomax Now with concern elevated PSA - see A&P - repeat PSA 3 months

## 2017-11-16 NOTE — Assessment & Plan Note (Signed)
Stable persistent chronic dementia without recent worsening Suspect too early to tell if improved on Donezpeil 10mg  daily now Followed by [facility] Neurology Dr Delice Lesch Last test EEG negative Follow-up as planned with neuropsych testing 02/2018 through [facility] neuro and then upcoming repeat MRI and office visit with Dr Delice Lesch again for determine progress and monitoring

## 2017-11-24 ENCOUNTER — Telehealth: Payer: Self-pay

## 2017-11-24 ENCOUNTER — Other Ambulatory Visit: Payer: Self-pay

## 2017-11-24 ENCOUNTER — Emergency Department
Admission: EM | Admit: 2017-11-24 | Discharge: 2017-11-24 | Disposition: A | Discharge status: Home / Self Care | Payer: Medicare Other | Attending: Emergency Medicine | Admitting: Emergency Medicine

## 2017-11-24 ENCOUNTER — Emergency Department: Payer: Medicare Other

## 2017-11-24 ENCOUNTER — Encounter: Payer: Self-pay | Admitting: Emergency Medicine

## 2017-11-24 DIAGNOSIS — Z7982 Long term (current) use of aspirin: Secondary | ICD-10-CM | POA: Diagnosis not present

## 2017-11-24 DIAGNOSIS — N183 Chronic kidney disease, stage 3 (moderate): Secondary | ICD-10-CM | POA: Insufficient documentation

## 2017-11-24 DIAGNOSIS — Z79899 Other long term (current) drug therapy: Secondary | ICD-10-CM | POA: Diagnosis not present

## 2017-11-24 DIAGNOSIS — Z87891 Personal history of nicotine dependence: Secondary | ICD-10-CM | POA: Diagnosis not present

## 2017-11-24 DIAGNOSIS — J45909 Unspecified asthma, uncomplicated: Secondary | ICD-10-CM | POA: Diagnosis not present

## 2017-11-24 DIAGNOSIS — R42 Dizziness and giddiness: Secondary | ICD-10-CM

## 2017-11-24 DIAGNOSIS — E039 Hypothyroidism, unspecified: Secondary | ICD-10-CM | POA: Insufficient documentation

## 2017-11-24 LAB — COMPREHENSIVE METABOLIC PANEL
ALT: 11 U/L (ref 0–44)
AST: 23 U/L (ref 15–41)
Albumin: 4.4 g/dL (ref 3.5–5.0)
Alkaline Phosphatase: 70 U/L (ref 38–126)
Anion gap: 10 (ref 5–15)
BUN: 14 mg/dL (ref 8–23)
CO2: 25 mmol/L (ref 22–32)
Calcium: 9.2 mg/dL (ref 8.9–10.3)
Chloride: 101 mmol/L (ref 98–111)
Creatinine, Ser: 1.21 mg/dL (ref 0.61–1.24)
GFR calc Af Amer: >60 mL/min (ref >60)
GFR calc non Af Amer: 56 mL/min — ABNORMAL LOW (ref >60)
Glucose, Bld: 137 mg/dL — ABNORMAL HIGH (ref 70–99)
Potassium: 4.2 mmol/L (ref 3.5–5.1)
Sodium: 136 mmol/L (ref 135–145)
Total Bilirubin: 1.4 mg/dL — ABNORMAL HIGH (ref 0.3–1.2)
Total Protein: 7.3 g/dL (ref 6.5–8.1)

## 2017-11-24 LAB — CBC
HCT: 41.5 % (ref 40.0–52.0)
Hemoglobin: 14.2 g/dL (ref 13.0–18.0)
MCH: 31.9 pg (ref 26.0–34.0)
MCHC: 34.2 g/dL (ref 32.0–36.0)
MCV: 93.2 fL (ref 80.0–100.0)
Platelets: 235 10*3/uL (ref 150–440)
RBC: 4.45 MIL/uL (ref 4.40–5.90)
RDW: 13.7 % (ref 11.5–14.5)
WBC: 12.6 10*3/uL — ABNORMAL HIGH (ref 3.8–10.6)

## 2017-11-24 LAB — GLUCOSE, CAPILLARY: Glucose-Capillary: 105 mg/dL — ABNORMAL HIGH (ref 70–99)

## 2017-11-24 LAB — TROPONIN I: Troponin I: <0.03 ng/mL (ref <0.03)

## 2017-11-24 MED ORDER — SODIUM CHLORIDE 0.9 % IV SOLN
1000.0000 mL | Freq: Once | INTRAVENOUS | Status: AC
  Administered 2017-11-24: 1000 mL via INTRAVENOUS

## 2017-11-24 NOTE — ED Provider Notes (Signed)
[facility] Emergency Department Provider Note   ____________________________________________    I have reviewed the triage vital signs and the nursing notes.   HISTORY  Chief Complaint Dizziness     HPI Blake Bates is a 77 y.o. male who presents with complaints of dizziness.  Patient reports he felt well this morning when he got out of bed, he was sitting at the computer checking the weather when he developed dizziness which he describes as a sense of motion.  No lightheadedness, no chest pain.  No shortness of breath.  No palpitations.  Was able to ambulate to his wife and they came to the emergency department.  No fevers or chills.  Currently feels quite well and no dizziness  Past Medical History:  Diagnosis Date  . Asthma   . Diverticulosis   . Full dentures   . Hyperlipidemia   . Hypothyroidism    s/p subtotal thyroidectomy  . Muscle spasms of lower extremity    Right upper leg  . Osteoporosis   . Rhinitis, allergic    pollens, mold, animal dander    Patient Active Problem List   Diagnosis Date Noted  . Dementia 07/21/2017  . Elevated PSA 12/09/2016  . Vitamin D deficiency 12/09/2016  . Chronic pain of right knee 11/04/2016  . Hyperlipidemia 11/04/2016  . CKD (chronic kidney disease), stage III ([place]) 11/04/2016  . Osteoarthritis of multiple joints 11/03/2016  . Chronic right hip pain 11/03/2016  . 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 Hypothyroidism 11/03/2014  . Asthma 11/03/2014  . Osteoporosis 11/03/2014  . Skin lesion of chest wall 11/03/2014  . Trigger finger of both hands 11/03/2014    Past Surgical History:  Procedure Laterality Date  . CATARACT EXTRACTION W/PHACO Left 09/20/2014   Procedure: CATARACT EXTRACTION PHACO AND INTRAOCULAR LENS PLACEMENT (IOC);  Surgeon: Leandrew Koyanagi, MD;  Location: [facility];  Service: Ophthalmology;  Laterality: Left;  . COLONOSCOPY    .  PARATHYROIDECTOMY    . THYROID SURGERY     subtotal    Prior to Admission medications   Medication Sig Start Date End Date Taking? Authorizing Provider  albuterol (PROVENTIL HFA;VENTOLIN HFA) 108 (90 BASE) MCG/ACT inhaler Inhale into the lungs every 6 (six) hours as needed for wheezing or shortness of breath.    [provider]  aspirin EC 81 MG tablet Take 1 tablet (81 mg total) by mouth daily. 12/09/16   Karamalegos, Devonne Doughty, DO  Cholecalciferol (VITAMIN D-3 PO) Take 5,000 Units/oz/day by mouth daily. PM     [provider]  Cyanocobalamin (VITAMIN B-12) 5000 MCG SUBL Place under the tongue daily.    [provider]  donepezil (ARICEPT) 10 MG tablet Take 1/2 tablet daily for 2 weeks, then increase to 1 tablet daily Patient taking differently: Take 10 mg by mouth.  10/09/17   Cameron Sprang, MD  levothyroxine (SYNTHROID, LEVOTHROID) 50 MCG tablet TAKE 1 TABLET(50 MCG) BY MOUTH DAILY 09/18/17   Karamalegos, Devonne Doughty, DO  loratadine (CLARITIN) 10 MG tablet Take 10 mg by mouth daily as needed for allergies. PM    [provider]  meloxicam (MOBIC) 15 MG tablet Take 1 tablet (15 mg total) by mouth daily as needed for pain. For arthritis flare, take up to 1-2 week at a time 11/16/17   [name], DO     Allergies Mold extract  [trichophyton] and Morphine  Family History  Problem Relation Age of Onset  .  Stroke Mother 40  . Lung cancer Father 85  . Prostate cancer Paternal Grandfather 8  . Stroke Paternal Aunt   . Stroke Maternal Grandfather     Social History Social History   Tobacco Use  . Smoking status: Former Smoker    Packs/day: 0.00    Years: 60.00    Pack years: 0.00    Types: Pipe    Last attempt to quit: 05/12/2017    Years since quitting: 0.5  . Smokeless tobacco: Never Used  . Tobacco comment: Self taper down from full pipe daily down to half to quarter pipe  Substance Use Topics  . Alcohol use: Yes     Alcohol/week: 1.2 oz    Types: 2 Glasses of wine per week  . Drug use: No    Review of Systems  Constitutional: No fever/chills Eyes: No visual changes.  ENT: No sore throat. Cardiovascular: Denies chest pain. Respiratory: Denies shortness of breath. Gastrointestinal: No abdominal pain.  No nausea, no vomiting.   Genitourinary: Negative for dysuria. Musculoskeletal: Negative for back pain. Skin: Negative for rash. Neurological: Negative for headaches or focal weakness   ____________________________________________   PHYSICAL EXAM:  VITAL SIGNS: ED Triage Vitals  Enc Vitals Group     BP 11/24/17 1328 (!) 142/54     Pulse Rate 11/24/17 1328 (!) 52     Resp 11/24/17 1328 18     Temp 11/24/17 1328 98.2 F (36.8 C)     Temp Source 11/24/17 1328 Oral     SpO2 11/24/17 1328 99 %     Weight 11/24/17 1329 69.9 kg (154 lb)     Height 11/24/17 1329 1.778 m (5\' 10" )     Head Circumference --      Peak Flow --      Pain Score 11/24/17 1329 0     Pain Loc --      Pain Edu? --      Excl. in [place]? --     Constitutional: Alert and oriented. No acute distress. Pleasant and interactive Eyes: Conjunctivae are normal.   Nose: No congestion/rhinnorhea. Mouth/Throat: Mucous membranes are moist.    Cardiovascular: Normal rate, regular rhythm. Grossly normal heart sounds.  Good peripheral circulation. Respiratory: Normal respiratory effort.  No retractions. Lungs CTAB. Gastrointestinal: Soft and nontender. No distention.  No CVA tenderness.  Musculoskeletal: No lower extremity tenderness nor edema.  Warm and well perfused Neurologic:  Normal speech and language. No gross focal neurologic deficits are appreciated.  Skin:  Skin is warm, dry and intact. No rash noted. Psychiatric: Mood and affect are normal. Speech and behavior are normal.  ____________________________________________   LABS (all labs ordered are listed, but only abnormal results are displayed)  Labs Reviewed    GLUCOSE, CAPILLARY - Abnormal; Notable for the following components:      Result Value   Glucose-Capillary 105 (*)    All other components within normal limits  CBC - Abnormal; Notable for the following components:   WBC 12.6 (*)    All other components within normal limits  COMPREHENSIVE METABOLIC PANEL - Abnormal; Notable for the following components:   Glucose, Bld 137 (*)    Total Bilirubin 1.4 (*)    GFR calc non Af Amer 56 (*)    All other components within normal limits  TROPONIN I   ____________________________________________  EKG  ED ECG REPORT I, Lavonia Drafts, the attending physician, personally viewed and interpreted this ECG.  Date: 11/24/2017  Rhythm: normal sinus rhythm  QRS Axis: normal Intervals: normal ST/T Wave abnormalities: normal Narrative Interpretation: no evidence of acute ischemia  ____________________________________________  RADIOLOGY  Chest x-ray unremarkable ____________________________________________   PROCEDURES  Procedure(s) performed: No  Procedures   Critical Care performed: No ____________________________________________   INITIAL IMPRESSION / ASSESSMENT AND PLAN / ED COURSE  Pertinent labs & imaging results that were available during my care of the patient were reviewed by me and considered in my medical decision making (see chart for details).  Patient well-appearing in no acute distress, vital signs are unremarkable.  Exam is reassuring.  EKG unremarkable.  Will check labs give IV fluids, check chest x-ray and reevaluate   ----------------------------------------- 2:49 PM on 11/24/2017 -----------------------------------------  Lab work is unremarkable.  Chest x-ray normal.  Patient continues to feel well.  Suspect brief episode of vertigo, not consistent with CVA or ACS   ----------------------------------------- 3:02 PM on 11/24/2017 -----------------------------------------  Patient reports he feels well,  no further trembling or dizziness.  Labs are unremarkable.  Appropriate for discharge with close outpatient follow-up, strict return precautions discussed    ____________________________________________   FINAL CLINICAL IMPRESSION(S) / ED DIAGNOSES  Final diagnoses:  Dizziness        Note:  This document was prepared using Dragon voice recognition software and may include unintentional dictation errors.    Lavonia Drafts, MD 11/24/17 [phone]

## 2017-11-24 NOTE — Telephone Encounter (Signed)
Mrs Blake Bates called reg patient he is very dizzy with high B/P ranges 205/101 and doesn't feel good. Advised Blake Bates to go to ER to evaluate patient.

## 2017-11-24 NOTE — Telephone Encounter (Signed)
Initial triage given reported BP >200 and dizziness to go to [facility] ED. I have now reviewed ED note and he was diagnosed with episode of vertigo. Will follow-up outpatient as planned.  [name], [facility] 11/24/2017, 5:26 PM

## 2017-11-24 NOTE — ED Triage Notes (Signed)
Pt reports that an hour PTA, he developed dizziness and broke out in a cold sweat. No other neuro symptoms.

## 2018-01-06 DIAGNOSIS — Z1283 Encounter for screening for malignant neoplasm of skin: Secondary | ICD-10-CM | POA: Diagnosis not present

## 2018-01-06 DIAGNOSIS — Z85820 Personal history of malignant melanoma of skin: Secondary | ICD-10-CM | POA: Diagnosis not present

## 2018-01-06 DIAGNOSIS — L578 Other skin changes due to chronic exposure to nonionizing radiation: Secondary | ICD-10-CM | POA: Diagnosis not present

## 2018-01-06 DIAGNOSIS — Z872 Personal history of diseases of the skin and subcutaneous tissue: Secondary | ICD-10-CM | POA: Diagnosis not present

## 2018-02-22 ENCOUNTER — Other Ambulatory Visit: Payer: Medicare Other

## 2018-02-22 DIAGNOSIS — N183 Chronic kidney disease, stage 3 (moderate): Secondary | ICD-10-CM | POA: Diagnosis not present

## 2018-02-22 DIAGNOSIS — E559 Vitamin D deficiency, unspecified: Secondary | ICD-10-CM | POA: Diagnosis not present

## 2018-02-24 LAB — BASIC METABOLIC PANEL WITH GFR
BUN: 13 mg/dL (ref 7–25)
CO2: 30 mmol/L (ref 20–32)
Calcium: 8.9 mg/dL (ref 8.6–10.3)
Chloride: 102 mmol/L (ref 98–110)
Creat: 1.15 mg/dL (ref 0.70–1.18)
GFR, Est African American: 71 mL/min/{1.73_m2} (ref >=60)
GFR, Est Non African American: 61 mL/min/{1.73_m2} (ref >=60)
Glucose, Bld: 85 mg/dL (ref 65–99)
Potassium: 4 mmol/L (ref 3.5–5.3)
Sodium: 139 mmol/L (ref 135–146)

## 2018-02-24 LAB — VITAMIN D 25 HYDROXY (VIT D DEFICIENCY, FRACTURES): Vit D, 25-Hydroxy: 57 ng/mL (ref 30–100)

## 2018-02-24 LAB — REFLEX PSA, FREE
PSA, % Free: 19 % (calc) — ABNORMAL LOW (ref >25)
PSA, Free: 0.8 ng/mL

## 2018-02-24 LAB — PSA, TOTAL WITH REFLEX TO PSA, FREE: PSA, Total: 4.2 ng/mL — ABNORMAL HIGH (ref <=4.0)

## 2018-03-02 ENCOUNTER — Other Ambulatory Visit: Payer: Medicare Other

## 2018-03-08 ENCOUNTER — Ambulatory Visit
Admission: RE | Admit: 2018-03-08 | Discharge: 2018-03-08 | Disposition: A | Discharge status: Home / Self Care | Payer: Medicare Other | Source: Ambulatory Visit | Attending: Neurology | Admitting: Neurology

## 2018-03-08 DIAGNOSIS — G930 Cerebral cysts: Secondary | ICD-10-CM

## 2018-03-08 DIAGNOSIS — F03A0 Unspecified dementia, mild, without behavioral disturbance, psychotic disturbance, mood disturbance, and anxiety: Secondary | ICD-10-CM

## 2018-03-08 DIAGNOSIS — F0390 Unspecified dementia without behavioral disturbance: Secondary | ICD-10-CM

## 2018-03-08 MED ORDER — GADOBENATE DIMEGLUMINE 529 MG/ML IV SOLN
13.0000 mL | Freq: Once | INTRAVENOUS | Status: AC | PRN
  Administered 2018-03-08: 13 mL via INTRAVENOUS

## 2018-03-11 ENCOUNTER — Ambulatory Visit (INDEPENDENT_AMBULATORY_CARE_PROVIDER_SITE_OTHER): Payer: Medicare Other | Admitting: Psychology

## 2018-03-11 ENCOUNTER — Encounter: Payer: Self-pay | Admitting: Psychology

## 2018-03-11 ENCOUNTER — Ambulatory Visit: Payer: Medicare Other | Admitting: Psychology

## 2018-03-11 DIAGNOSIS — R413 Other amnesia: Secondary | ICD-10-CM

## 2018-03-11 DIAGNOSIS — F0390 Unspecified dementia without behavioral disturbance: Secondary | ICD-10-CM

## 2018-03-11 DIAGNOSIS — F03A0 Unspecified dementia, mild, without behavioral disturbance, psychotic disturbance, mood disturbance, and anxiety: Secondary | ICD-10-CM
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
## 2018-03-11 NOTE — Progress Notes (Signed)
   Neuropsychology Note  Blake Bates completed 60 minutes of neuropsychological testing with technician, Milana Kidney, BS, under the supervision of Dr. Macarthur Critchley, Licensed Psychologist. The patient did not appear overtly distressed by the testing session, per behavioral observation or via self-report to the technician. Rest breaks were offered.   Clinical Decision Making: In considering the patient's current level of functioning, level of presumed impairment, nature of symptoms, emotional and behavioral responses during the interview, level of literacy, and observed level of motivation/effort, a battery of tests was selected and communicated to the psychometrician.  Communication between the psychologist and technician was ongoing throughout the testing session and changes were made as deemed necessary based on patient performance on testing, technician observations and additional pertinent factors such as those listed above.  Blake Bates will return within approximately 2 weeks for an interactive feedback session with Dr. Si Raider at which time his test performances, clinical impressions and treatment recommendations will be reviewed in detail. The patient understands he can contact our office should he require our assistance before this time.  35 minutes spent performing neuropsychological evaluation services/clinical decision making (psychologist). [CPT 42706] 60 minutes spent face-to-face with patient administering standardized tests, 30 minutes spent scoring (technician). [CPT [id number], 23762]  Full report to follow.

## 2018-03-24 DIAGNOSIS — E039 Hypothyroidism, unspecified: Secondary | ICD-10-CM

## 2018-03-24 MED ORDER — LEVOTHYROXINE SODIUM 50 MCG PO TABS: 50.0000 ug | ORAL_TABLET | Freq: Every day | ORAL | 3 refills | Status: DC
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
## 2018-03-29 ENCOUNTER — Ambulatory Visit (INDEPENDENT_AMBULATORY_CARE_PROVIDER_SITE_OTHER): Payer: Medicare Other | Admitting: Psychology

## 2018-03-29 ENCOUNTER — Encounter: Payer: Self-pay | Admitting: Psychology

## 2018-03-29 DIAGNOSIS — F03A0 Unspecified dementia, mild, without behavioral disturbance, psychotic disturbance, mood disturbance, and anxiety: Secondary | ICD-10-CM

## 2018-03-29 DIAGNOSIS — Z961 Presence of intraocular lens: Secondary | ICD-10-CM | POA: Diagnosis not present

## 2018-03-29 DIAGNOSIS — F0390 Unspecified dementia without behavioral disturbance: Secondary | ICD-10-CM | POA: Diagnosis not present

## 2018-03-29 NOTE — Patient Instructions (Signed)
Clinical Impressions: Mild dementia, unspecified.  Results of testing were abnormal and demonstrated cognitive decline in multiple domains, relative to age-matched peers and relative to estimated premorbid baseline. Additionally, there is evidence that the patient's cognitive difficulties are interfering with his ability to manage complex ADLs (driving, finances). As such, diagnostic criteria for a dementia syndrome are met.  His cognitive profile is not entirely consistent with what we often see in AD; namely, retention of new information as well as confrontation naming are relatively intact. Furthermore, the patient's cognitive profile is more suggestive of subcortical than cortical involvement. Specifically, he demonstrated inefficient encoding strategies for non-contextual information (while cued recall was normal, and encoding of contextual information was relatively intact). He also demonstrated reduced reasoning abilities and mild declines in mental flexibility and visual-spatial construction.  Given his cognitive profile alongside gait/balance difficulty, falls, and tremor and micrographia demonstrated on testing, I recommend consideration of a parkinsonian related dementia, such as Lewy body dementia.  Fortunately, he is not reporting significant depression or anxiety, and there does not appear to be any underlying psychiatric disorder.    Recommendations/Plan: Based on the findings of the present evaluation, the following recommendations are offered:  1. He appears to have the appropriate level of support with IADLs from his wife and hired Arts development officer. I agree with their decision to have the caregiver come when the patient's wife is out of town.  2. I agree with his decision to reduce alcohol use.  3. I agree with his decision to stop driving. 4. Further evaluation of possible parkinsonism may be indicated; they will follow up with Dr. Delice Lesch. 5. Continue Aricept. 6. Continue to  engage in activities that provide social interaction and safe cardiovascular exercise. 7. Neurocognitive re-evaluation in 1-2 years.

## 2018-04-22 ENCOUNTER — Encounter: Payer: Medicare Other | Admitting: Psychology

## 2018-05-11 ENCOUNTER — Ambulatory Visit: Payer: Medicare Other | Admitting: Neurology

## 2018-05-11 ENCOUNTER — Encounter

## 2018-05-25 ENCOUNTER — Other Ambulatory Visit: Payer: Self-pay | Admitting: Family Medicine

## 2018-05-25 ENCOUNTER — Ambulatory Visit (INDEPENDENT_AMBULATORY_CARE_PROVIDER_SITE_OTHER): Payer: Medicare Other | Admitting: Family Medicine

## 2018-05-25 ENCOUNTER — Encounter: Payer: Self-pay | Admitting: Family Medicine

## 2018-05-25 VITALS — BP 128/47 | HR 74 | Temp 97.5°F | Resp 16 | Ht 70.0 in | Wt 157.2 lb

## 2018-05-25 DIAGNOSIS — N401 Enlarged prostate with lower urinary tract symptoms: Secondary | ICD-10-CM

## 2018-05-25 DIAGNOSIS — F0280 Dementia in other diseases classified elsewhere without behavioral disturbance: Secondary | ICD-10-CM

## 2018-05-25 DIAGNOSIS — N183 Chronic kidney disease, stage 3 unspecified: Secondary | ICD-10-CM

## 2018-05-25 DIAGNOSIS — R7309 Other abnormal glucose: Secondary | ICD-10-CM

## 2018-05-25 DIAGNOSIS — M159 Polyosteoarthritis, unspecified: Secondary | ICD-10-CM

## 2018-05-25 DIAGNOSIS — R972 Elevated prostate specific antigen [PSA]: Secondary | ICD-10-CM | POA: Diagnosis not present

## 2018-05-25 DIAGNOSIS — G3183 Dementia with Lewy bodies: Secondary | ICD-10-CM | POA: Diagnosis not present

## 2018-05-25 DIAGNOSIS — E034 Atrophy of thyroid (acquired): Secondary | ICD-10-CM

## 2018-05-25 DIAGNOSIS — Z0000 Encounter for general adult medical examination without abnormal findings: Secondary | ICD-10-CM

## 2018-05-25 DIAGNOSIS — M150 Primary generalized (osteo)arthritis: Secondary | ICD-10-CM | POA: Diagnosis not present

## 2018-05-25 DIAGNOSIS — E782 Mixed hyperlipidemia: Secondary | ICD-10-CM

## 2018-05-25 DIAGNOSIS — E559 Vitamin D deficiency, unspecified: Secondary | ICD-10-CM

## 2018-05-25 DIAGNOSIS — M8949 Other hypertrophic osteoarthropathy, multiple sites: Secondary | ICD-10-CM

## 2018-05-25 DIAGNOSIS — N138 Other obstructive and reflux uropathy: Secondary | ICD-10-CM

## 2018-05-25 NOTE — Assessment & Plan Note (Signed)
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

## 2018-05-25 NOTE — Assessment & Plan Note (Addendum)
Improved PSA on last check 02/2018 trend back to prior baseline 6 to 4 now Reassurance without any prostate symptoms Offered Urologist consult will reconsider in future Agree to re-check PSA trend q 6 months now - next lab 11/2018

## 2018-05-25 NOTE — Assessment & Plan Note (Signed)
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

## 2018-05-25 NOTE — Assessment & Plan Note (Signed)
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 

## 2018-05-25 NOTE — Patient Instructions (Addendum)
Thank you for coming to the office today.  Follow-up with Dr Delice Lesch as scheduled in February - ask about Parkinsonian aspect - consider other treatments if available.  Figure out follow-up plan for future - likely testing again in 1-2 years.  BP is in normal appropriate range again, keep track, if new concerns let me know.  Will re-check PSA prostate in 6 months  Continue meloxicam for arthritis as you are  DUE for FASTING BLOOD WORK (no food or drink after midnight before the lab appointment, only water or coffee without cream/sugar on the morning of)  SCHEDULE "Lab Only" visit in the morning at the clinic for lab draw in 6 MONTHS   - Make sure Lab Only appointment is at about 1 week before your next appointment, so that results will be available  For Lab Results, once available within 2-3 days of blood draw, you can can log in to MyChart online to view your results and a brief explanation. Also, we can discuss results at next follow-up visit.   Please schedule a Follow-up Appointment to: Return in about 6 months (around 11/23/2018) for Annual Physical.  If you have any other questions or concerns, please feel free to call the office or send a message through [facility]. You may also schedule an earlier appointment if necessary.  Additionally, you may be receiving a survey about your experience at our office within a few days to 1 week by e-mail or mail. We value your feedback.  [name], DO [place]

## 2018-05-25 NOTE — Progress Notes (Signed)
Subjective:    Patient ID: Blake Bates, male    DOB: May 14, 1940, 78 y.o.   MRN: [id number]  Blake Bates is a 78 y.o. male presenting on 05/25/2018 for Dementia (after neurology appointment)  Patient is accompanied by wife, [name], who provides additional history. He has history of dementia.  HPI  FOLLOW-UP DEMENTIA - Lewy Body Dementia - Last visit with me 11/2017, for initial visit for same problem, treated with continued Aricept and f/u w/ Neurology, see prior notes for background information. 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  - Today patient reports doing very well - He continues on Aricept 10mg  with improvement - They state that he has some mild tremors that are improved. 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  Elevated BP without dx HTN Last visit had elevated BP. Prior Chart review shows prior normal readings of BP. Home readings normal. Improved since last visit  Not taking anti HTN medications Denies CP, dyspnea, HA, edema, dizziness /  lightheadedness   Health Maintenance: UTD Flu vaccine 02/2018  Depression screen [facility] 2/9 05/25/2018 11/16/2017 06/09/2017  Decreased Interest 0 0 0  Down, Depressed, Hopeless 0 0 0  PHQ - 2 Score 0 0 0  Altered sleeping - 0 -  Tired, decreased energy - 0 -  Change in appetite - 0 -  Feeling bad or failure about yourself  - 0 -  Trouble concentrating - 0 -  Moving slowly or fidgety/restless - 0 -  Suicidal thoughts - 0 -  PHQ-9 Score - 0 -    Social History   Tobacco Use  . Smoking status: Former Smoker    Packs/day: 0.00    Years: 60.00    Pack years: 0.00    Types: Pipe    Last attempt to quit: 05/12/2017    Years since quitting: 1.0  . Smokeless tobacco: Never Used  . Tobacco comment: Self taper down from full pipe daily down to half to quarter pipe  Substance Use Topics  . Alcohol use: Yes    Alcohol/week: 2.0 standard drinks    Types: 2 Glasses of wine per week  . Drug use: No    Review of Systems Per HPI unless specifically indicated above     Objective:    BP (!) 128/47   Pulse 74   Temp (!) 97.5 F (36.4 C) (Oral)   Resp 16   Ht 5\' 10"  (1.778 m)   Wt 157 lb 3.2 oz (71.3 kg)   BMI 22.56 kg/m   Wt Readings from Last 3 Encounters:  05/25/18 157 lb 3.2 oz (71.3 kg)  11/24/17  154 lb (69.9 kg)  11/16/17 154 lb (69.9 kg)    Physical Exam Vitals signs and nursing note reviewed.  Constitutional:      General: He is not in acute distress.    Appearance: He is well-developed. He is not diaphoretic.     Comments: Well-appearing elderly 78 year old male, comfortable, cooperative, thin appearing  HENT:     Head: Normocephalic and atraumatic.  Eyes:     Conjunctiva/sclera: Conjunctivae normal.  Neck:     Musculoskeletal: Neck supple.     Thyroid: No thyromegaly.  Cardiovascular:     Rate and Rhythm: Normal rate and regular rhythm.     Heart sounds: Normal heart sounds. No murmur.  Pulmonary:     Effort: Pulmonary effort is normal. No respiratory  distress.     Breath sounds: Normal breath sounds. No wheezing or rales.  Abdominal:     Palpations: There is no mass.  Musculoskeletal: Normal range of motion.        General: No tenderness.  Skin:    General: Skin is warm and dry.     Findings: No erythema or rash.  Neurological:     Mental Status: He is alert and oriented to person, place, and time.     Comments: Distal sensation intact to light touch all extremities  Psychiatric:        Behavior: Behavior normal.     Comments: Well groomed, good eye contact, normal speech and thoughts. Follows conversational well and seems more able to participate in conversation.    Results for orders placed or performed in visit on 45/40/98  BASIC METABOLIC PANEL WITH GFR  Result Value Ref Range   Glucose, Bld 85 65 - 99 mg/dL   BUN 13 7 - 25 mg/dL   Creat 1.15 0.70 - 1.18 mg/dL   GFR, Est Non African American 61 > OR = 60 mL/min/1.17m2   GFR, Est African American 71 > OR = 60 mL/min/1.18m2   BUN/Creatinine Ratio NOT APPLICABLE 6 - 22 (calc)   Sodium 139 135 - 146 mmol/L   Potassium 4.0 3.5 - 5.3 mmol/L   Chloride 102 98 - 110 mmol/L   CO2 30 20 - 32 mmol/L   Calcium 8.9 8.6 - 10.3 mg/dL  VITAMIN D 25 Hydroxy (Vit-D Deficiency, Fractures)  Result Value Ref Range   Vit D, 25-Hydroxy 57 30 - 100 ng/mL  PSA, Total with Reflex to PSA, Free  Result Value Ref Range   PSA, Total 4.2 (H) < OR = 4.0 ng/mL  reflex PSA, Free  Result Value Ref Range   PSA, Free 0.8 ng/mL   PSA, % Free 19 (L) >25 % (calc)      Assessment & Plan:   Problem List Items Addressed This Visit    CKD (chronic kidney disease), stage III (HCC)    Stable CKD-III Likely etiology age w/o HTN, DM Limit NSAIDs for OA/DJD - agree to refill Meloxicam proceed with caution, intermittent use advised - < 1-2 week at a time, interval dosing only Continue regular Tylenol dosing up to 1000mg  TID  Follow-up Cr trend in 6 months w/ labs      Elevated PSA    Improved PSA on  last check 02/2018 trend back to prior baseline 6 to 4 now Reassurance without any prostate symptoms Offered Urologist consult will reconsider in future Agree to re-check PSA trend q 6 months now - next lab 11/2018      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) - Primary    Stable persistent chronic dementia without recent worsening Followed by [facility] Neuropsych - 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 Improved on Aricept 10mg  daily  Encouraged to continue current course, Aricept 10mg , no med change today, continue with current improved activity walking, companionship, cognitive stimulation and activity - He should follow-up with Dr Delice Lesch as scheduled in Feb 2020 - discuss possible parkinson concerns - Follow-up future testing likely q 1-2 yr for neuropsych      Osteoarthritis of multiple joints    Stable chronic OA/DJD with hip and knee pain and stiffness Prior x-rays documented 2018 Improved on intermittent meloxicam  Plan Advised caution but may continue on NSAID intermittently only meloxicam 15mg  1-2 week as needed Follow-up         No orders of the defined types were placed in this encounter.    Follow up plan: Return in about 6 months (around 11/23/2018) for Annual Physical.  Future labs ordered for 11/17/18  [name], [facility] 05/25/2018, 1:39 PM

## 2018-06-15 ENCOUNTER — Ambulatory Visit (INDEPENDENT_AMBULATORY_CARE_PROVIDER_SITE_OTHER): Payer: Medicare Other

## 2018-06-15 VITALS — BP 122/74 | HR 58 | Temp 97.6°F | Resp 16 | Ht 70.0 in | Wt 156.6 lb

## 2018-06-15 DIAGNOSIS — Z0000 Encounter for general adult medical examination without abnormal findings: Secondary | ICD-10-CM | POA: Diagnosis not present

## 2018-06-15 NOTE — Progress Notes (Signed)
Subjective:   Blake Bates is a 78 y.o. male who presents for Medicare Annual/Subsequent preventive examination.  Review of Systems:  Cardiac Risk Factors include: advanced age (>92men, >76 women);hypertension;male gender;dyslipidemia     Objective:    Vitals: BP 122/74 (BP Location: Left Arm, Patient Position: Sitting, Cuff Size: Normal)   Pulse (!) 58   Temp 97.6 F (36.4 C) (Oral)   Resp 16   Ht 5\' 10"  (1.778 m)   Wt 156 lb 9.6 oz (71 kg)   BMI 22.47 kg/m   Body mass index is 22.47 kg/m.  Advanced Directives 06/15/2018 11/24/2017 06/09/2017 06/20/2016 05/29/2015  Does Patient Have a Medical Advance Directive? Yes Yes Yes Yes No  Type of Paramedic of [place];Living will Living will [place];Living will - -  Does patient want to make changes to medical advance directive? - - - - Yes - information given  Copy of [place] in Chart? No - copy requested - No - copy requested - -  Would patient like information on creating a medical advance directive? - - - - Yes - Educational materials given    Tobacco Social History   Tobacco Use  Smoking Status Former Smoker  . Packs/day: 0.00  . Years: 60.00  . Pack years: 0.00  . Types: Pipe  . Last attempt to quit: 05/12/2017  . Years since quitting: 1.0  Smokeless Tobacco Never Used  Tobacco Comment   Self taper down from full pipe daily down to half to quarter pipe     Counseling given: Not Answered Comment: Self taper down from full pipe daily down to half to quarter pipe   Clinical Intake:  Pre-visit preparation completed: Yes  Pain : No/denies pain     Nutritional Status: BMI of 19-24  Normal Nutritional Risks: None Diabetes: No  How often do you need to have someone help you when you read instructions, pamphlets, or other written materials from your doctor or pharmacy?: 1 - Never What is the last grade level you completed in school?:  Phd  Interpreter Needed?: No  Information entered by :: Tiffany Hill,LPN  Past Medical History:  Diagnosis Date  . Asthma   . Diverticulosis   . Full dentures   . Hyperlipidemia   . Hypothyroidism    s/p subtotal thyroidectomy  . Muscle spasms of lower extremity    Right upper leg  . Osteoporosis   . Rhinitis, allergic    pollens, mold, animal dander   Past Surgical History:  Procedure Laterality Date  . CATARACT EXTRACTION W/PHACO Left 09/20/2014   Procedure: CATARACT EXTRACTION PHACO AND INTRAOCULAR LENS PLACEMENT (IOC);  Surgeon: Leandrew Koyanagi, MD;  Location: [facility];  Service: Ophthalmology;  Laterality: Left;  . COLONOSCOPY    . PARATHYROIDECTOMY    . THYROID SURGERY     subtotal   Family History  Problem Relation Age of Onset  . Stroke Mother 53  . Lung cancer Father 58  . Prostate cancer Paternal Grandfather 31  . Stroke Paternal Aunt    Social History   Socioeconomic History  . Marital status: Married    Spouse name: Not on file  . Number of children: Not on file  . Years of education: Not on file  . Highest education level: Doctorate  Occupational History  . Occupation: retired  Scientific laboratory technician  . Financial resource strain: Not hard at all  . Food insecurity:    Worry: Never true  Inability: Never true  . Transportation needs:    Medical: No    Non-medical: No  Tobacco Use  . Smoking status: Former Smoker    Packs/day: 0.00    Years: 60.00    Pack years: 0.00    Types: Pipe    Last attempt to quit: 05/12/2017    Years since quitting: 1.0  . Smokeless tobacco: Never Used  . Tobacco comment: Self taper down from full pipe daily down to half to quarter pipe  Substance and Sexual Activity  . Alcohol use: Yes    Comment: occasionally beer or wine   . Drug use: No  . Sexual activity: Not on file  Lifestyle  . Physical activity:    Days per week: 7 days    Minutes per session: 60 min  . Stress: Not at all  Relationships  .  Social connections:    Talks on phone: More than three times a week    Gets together: More than three times a week    Attends religious service: More than 4 times per year    Active member of club or organization: Yes    Attends meetings of clubs or organizations: More than 4 times per year    Relationship status: Married  Other Topics Concern  . Not on file  Social History Narrative   Clubs- [employer]       Pt lives in 1 story home with his wife   Has 2 surviving children   PhD in Biology   Retired professor    Outpatient Encounter Medications as of 06/15/2018  Medication Sig  . Acetaminophen (TYLENOL ARTHRITIS PAIN PO) Take by mouth. Once a day  . albuterol (PROVENTIL HFA;VENTOLIN HFA) 108 (90 BASE) MCG/ACT inhaler Inhale into the lungs every 6 (six) hours as needed for wheezing or shortness of breath.  [name] aspirin EC 81 MG tablet Take 1 tablet (81 mg total) by mouth daily.  . Cholecalciferol (VITAMIN D-3 PO) Take 5,000 Units/oz/day by mouth daily. PM   . Cyanocobalamin (VITAMIN B-12) 5000 MCG SUBL Place under the tongue daily.  [name] donepezil (ARICEPT) 10 MG tablet Take 1/2 tablet daily for 2 weeks, then increase to 1 tablet daily (Patient taking differently: Take 10 mg by mouth. )  . levothyroxine (SYNTHROID, LEVOTHROID) 50 MCG tablet Take 1 tablet (50 mcg total) by mouth daily before breakfast.  . loratadine (CLARITIN) 10 MG tablet Take 10 mg by mouth daily as needed for allergies. PM  . meloxicam (MOBIC) 15 MG tablet Take 1 tablet (15 mg total) by mouth daily as needed for pain. For arthritis flare, take up to 1-2 week at a time  . Difluprednate (DUREZOL) 0.05 % EMUL Durezol 0.05 % eye drops  . ketorolac (ACULAR) 0.4 % SOLN ketorolac 0.4 % eye drops  . moxifloxacin (VIGAMOX) 0.5 % ophthalmic solution Vigamox 0.5 % eye drops   No facility-administered encounter medications on file as of 06/15/2018.     Activities of Daily Living In your present state of health, do you have any  difficulty performing the following activities: 06/15/2018  Hearing? N  Comment bilateral hearing aids - hasnt used recently   Vision? N  Difficulty concentrating or making decisions? N  Walking or climbing stairs? N  Dressing or bathing? N  Doing errands, shopping? N  Preparing Food and eating ? N  Using the Toilet? N  In the past six months, have you accidently leaked urine? N  Do you have problems with loss  of bowel control? N  Managing your Medications? N  Managing your Finances? N  Housekeeping or managing your Housekeeping? N  Some recent data might be hidden    Patient Care Team: [name], DO as PCP - General (Family Medicine)   Assessment:   This is a routine wellness examination for Bud.  Exercise Activities and Dietary recommendations Current Exercise Habits: Home exercise routine(walking at the park and mall if weather is bad ), Type of exercise: walking(1000 steps ), Time (Minutes): 60, Frequency (Times/Week): 7, Weekly Exercise (Minutes/Week): 420, Exercise limited by: None identified  Goals    . DIET - INCREASE WATER INTAKE     Recommend drinking at least 6-8 glasses of water a day    . Exercise 3x per week (30 min per time)     Trouble finding place to walk.     . Quit smoking / using tobacco     Wants to reduce tobacco for future.       Fall Risk Fall Risk  06/15/2018 05/25/2018 11/16/2017 06/09/2017 11/03/2016  Falls in the past year? 0 0 No No No  Number falls in past yr: 0 - - - -  Risk for fall due to : - - - - History of fall(s);Impaired balance/gait;Impaired vision;Impaired mobility   FALL RISK PREVENTION PERTAINING TO THE HOME:  Any stairs in or around the home WITH handrails? Yes  Home free of loose throw rugs in walkways, pet beds, electrical cords, etc? Yes  Adequate lighting in your home to reduce risk of falls? Yes   ASSISTIVE DEVICES UTILIZED TO PREVENT FALLS:  Life alert? No  Use of a cane, walker or w/c? No  Grab bars in  the bathroom? No  Shower chair or bench in shower? Yes  Elevated toilet seat or a handicapped toilet? No   DME ORDERS:  DME order needed?  No   TIMED UP AND GO:  Was the test performed? Yes .  Length of time to ambulate 10 feet: 12 sec.   GAIT:  Appearance of gait: Gait steady without the use of an assistive device.  Education: Fall risk prevention has been discussed.  Intervention(s) required? No    Depression Screen PHQ 2/9 Scores 06/15/2018 05/25/2018 11/16/2017 06/09/2017  PHQ - 2 Score 0 0 0 0  PHQ- 9 Score - - 0 -    Cognitive Function  Declined today, has appt with neuro on 06/25/2018  MMSE - Mini Mental State Exam 07/20/2017 06/20/2016 05/29/2015  Orientation to time 4 5 5   Orientation to Place 5 5 5   Registration 3 3 3   Attention/ Calculation 2 5 5   Recall 0 3 3  Language- name 2 objects 2 2 2   Language- repeat 1 1 1   Language- follow 3 step command 3 3 3   Language- read & follow direction 1 1 1   Write a sentence 1 1 1   Copy design 1 1 1   Total score 23 30 30    Montreal Cognitive Assessment  10/09/2017  Visuospatial/ Executive (0/5) 3  Naming (0/3) 3  Attention: Read list of digits (0/2) 2  Attention: Read list of letters (0/1) 1  Attention: Serial 7 subtraction starting at 100 (0/3) 3  Language: Repeat phrase (0/2) 2  Language : Fluency (0/1) 0  Abstraction (0/2) 2  Delayed Recall (0/5) 1  Orientation (0/6) 3  Total 20   6CIT Screen 06/09/2017  What Year? 0 points  What month? 0 points  What time? 0 points  Count back from 20 0 points  Months in reverse 0 points  Repeat phrase 2 points  Total Score 2    Immunization History  Administered Date(s) Administered  . Influenza, High Dose Seasonal PF 05/29/2015, 03/20/2016, 02/11/2017, 03/13/2018  . Influenza-Unspecified 02/20/2018  . Pneumococcal Conjugate-13 05/30/2014  . Pneumococcal Polysaccharide-23 04/30/2012  . Tdap 10/12/2013  . Zoster 04/11/2012    Qualifies for Shingles Vaccine? Yes   Zostavax completed - patient states a few years ago. Due for Shingrix. Education has been provided regarding the importance of this vaccine. Pt has been advised to call insurance company to determine out of pocket expense. Advised may also receive vaccine at local pharmacy or Health Dept. Verbalized acceptance and understanding.  Tdap: up todate  Flu Vaccine: up to date .  Pneumococcal Vaccine: completed series Screening Tests Health Maintenance  Topic Date Due  . TETANUS/TDAP  10/13/2023  . INFLUENZA VACCINE  Completed  . PNA vac Low Risk Adult  Completed   Cancer Screenings:  Colorectal Screening: no longer required  Lung Cancer Screening: (Low Dose CT Chest recommended if Age 51-80 years, 30 pack-year currently smoking OR have quit w/in 15years.) does not qualify.     Additional Screening:  Hepatitis C Screening: does not qualify  Vision Screening: Recommended annual ophthalmology exams for early detection of glaucoma and other disorders of the eye. Is the patient up to date with their annual eye exam?  Yes  Who is the provider or what is the name of the office in which the pt attends annual eye exams? [facility]   Dental Screening: Recommended annual dental exams for proper oral hygiene  Community Resource Referral:  CRR required this visit?  No       Plan:    I have personally reviewed and addressed the Medicare Annual Wellness questionnaire and have noted the following in the patient's chart:  A. Medical and social history B. Use of alcohol, tobacco or illicit drugs  C. Current medications and supplements D. Functional ability and status E.  Nutritional status F.  Physical activity G. Advance directives H. List of other physicians I.  Hospitalizations, surgeries, and ER visits in previous 12 months J.  [place] such as hearing and vision if needed, cognitive and depression L. Referrals and appointments   In addition, I have reviewed and  discussed with patient certain preventive protocols, quality metrics, and best practice recommendations. A written personalized care plan for preventive services as well as general preventive health recommendations were provided to patient.   Signed,  Tyler Aas, LPN Nurse Health Advisor   Nurse Notes: none

## 2018-06-15 NOTE — Patient Instructions (Signed)
[name] , Thank you for taking time to come for your Medicare Wellness Visit. I appreciate your ongoing commitment to your health goals. Please review the following plan we discussed and let me know if I can assist you in the future.   Screening recommendations/referrals: Colonoscopy: no longer required Recommended yearly ophthalmology/optometry visit for glaucoma screening and checkup Recommended yearly dental visit for hygiene and checkup  Vaccinations: Influenza vaccine: completed 02/20/2018 Pneumococcal vaccine: completed series Tdap vaccine: completed 10/12/2013 Shingles vaccine: shingrix eligible, check with your insurance company for coverage     Advanced directives: Please bring a copy of your health care power of attorney and living will to the office at your convenience.  Conditions/risks identified: recommend increasing your water intake to at least 3 glasses a day   Next appointment:  Follow up on 11/17/2018 at 8:15am for labwork at [facility]. Follow up on 11/24/2018 at 9:00am for your physical with Dr.Karamalegos. Follow up in one year for your annual wellness exam.   Preventive Care 65 Years and Older, Male Preventive care refers to lifestyle choices and visits with your health care provider that can promote health and wellness. What does preventive care include?  A yearly physical exam. This is also called an annual well check.  Dental exams once or twice a year.  Routine eye exams. Ask your health care provider how often you should have your eyes checked.  Personal lifestyle choices, including:  Daily care of your teeth and gums.  Regular physical activity.  Eating a healthy diet.  Avoiding tobacco and drug use.  Limiting alcohol use.  Practicing safe sex.  Taking low doses of aspirin every day.  Taking vitamin and mineral supplements as recommended by your health care provider. What happens during an annual well check? The services and screenings done by  your health care provider during your annual well check will depend on your age, overall health, lifestyle risk factors, and family history of disease. Counseling  Your health care provider may ask you questions about your:  Alcohol use.  Tobacco use.  Drug use.  Emotional well-being.  Home and relationship well-being.  Sexual activity.  Eating habits.  History of falls.  Memory and ability to understand (cognition).  Work and work Statistician. Screening  You may have the following tests or measurements:  Height, weight, and BMI.  Blood pressure.  Lipid and cholesterol levels. These may be checked every 5 years, or more frequently if you are over 20 years old.  Skin check.  Lung cancer screening. You may have this screening every year starting at age 76 if you have a 30-pack-year history of smoking and currently smoke or have quit within the past 15 years.  Fecal occult blood test (FOBT) of the stool. You may have this test every year starting at age 66.  Flexible sigmoidoscopy or colonoscopy. You may have a sigmoidoscopy every 5 years or a colonoscopy every 10 years starting at age 58.  Prostate cancer screening. Recommendations will vary depending on your family history and other risks.  Hepatitis C blood test.  Hepatitis B blood test.  Sexually transmitted disease (STD) testing.  Diabetes screening. This is done by checking your blood sugar (glucose) after you have not eaten for a while (fasting). You may have this done every 1-3 years.  Abdominal aortic aneurysm (AAA) screening. You may need this if you are a current or former smoker.  Osteoporosis. You may be screened starting at age 88 if you are at high  risk. Talk with your health care provider about your test results, treatment options, and if necessary, the need for more tests. Vaccines  Your health care provider may recommend certain vaccines, such as:  Influenza vaccine. This is recommended every  year.  Tetanus, diphtheria, and acellular pertussis (Tdap, Td) vaccine. You may need a Td booster every 10 years.  Zoster vaccine. You may need this after age 52.  Pneumococcal 13-valent conjugate (PCV13) vaccine. One dose is recommended after age 26.  Pneumococcal polysaccharide (PPSV23) vaccine. One dose is recommended after age 89. Talk to your health care provider about which screenings and vaccines you need and how often you need them. This information is not intended to replace advice given to you by your health care provider. Make sure you discuss any questions you have with your health care provider. Document Released: 05/25/2015 Document Revised: 01/16/2016 Document Reviewed: 02/27/2015 Elsevier Interactive Patient Education  2017 [name] Prevention in the Home Falls can cause injuries. They can happen to people of all ages. There are many things you can do to make your home safe and to help prevent falls. What can I do on the outside of my home?  Regularly fix the edges of walkways and driveways and fix any cracks.  Remove anything that might make you trip as you walk through a door, such as a raised step or threshold.  Trim any bushes or trees on the path to your home.  Use bright outdoor lighting.  Clear any walking paths of anything that might make someone trip, such as rocks or tools.  Regularly check to see if handrails are loose or broken. Make sure that both sides of any steps have handrails.  Any raised decks and porches should have guardrails on the edges.  Have any leaves, snow, or ice cleared regularly.  Use sand or salt on walking paths during winter.  Clean up any spills in your garage right away. This includes oil or grease spills. What can I do in the bathroom?  Use night lights.  Install grab bars by the toilet and in the tub and shower. Do not use towel bars as grab bars.  Use non-skid mats or decals in the tub or shower.  If you  need to sit down in the shower, use a plastic, non-slip stool.  Keep the floor dry. Clean up any water that spills on the floor as soon as it happens.  Remove soap buildup in the tub or shower regularly.  Attach bath mats securely with double-sided non-slip rug tape.  Do not have throw rugs and other things on the floor that can make you trip. What can I do in the bedroom?  Use night lights.  Make sure that you have a light by your bed that is easy to reach.  Do not use any sheets or blankets that are too big for your bed. They should not hang down onto the floor.  Have a firm chair that has side arms. You can use this for support while you get dressed.  Do not have throw rugs and other things on the floor that can make you trip. What can I do in the kitchen?  Clean up any spills right away.  Avoid walking on wet floors.  Keep items that you use a lot in easy-to-reach places.  If you need to reach something above you, use a strong step stool that has a grab bar.  Keep electrical cords out of the way.  Do not use floor polish or wax that makes floors slippery. If you must use wax, use non-skid floor wax.  Do not have throw rugs and other things on the floor that can make you trip. What can I do with my stairs?  Do not leave any items on the stairs.  Make sure that there are handrails on both sides of the stairs and use them. Fix handrails that are broken or loose. Make sure that handrails are as long as the stairways.  Check any carpeting to make sure that it is firmly attached to the stairs. Fix any carpet that is loose or worn.  Avoid having throw rugs at the top or bottom of the stairs. If you do have throw rugs, attach them to the floor with carpet tape.  Make sure that you have a light switch at the top of the stairs and the bottom of the stairs. If you do not have them, ask someone to add them for you. What else can I do to help prevent falls?  Wear shoes  that:  Do not have high heels.  Have rubber bottoms.  Are comfortable and fit you well.  Are closed at the toe. Do not wear sandals.  If you use a stepladder:  Make sure that it is fully opened. Do not climb a closed stepladder.  Make sure that both sides of the stepladder are locked into place.  Ask someone to hold it for you, if possible.  Clearly mark and make sure that you can see:  Any grab bars or handrails.  First and last steps.  Where the edge of each step is.  Use tools that help you move around (mobility aids) if they are needed. These include:  Canes.  Walkers.  Scooters.  Crutches.  Turn on the lights when you go into a dark area. Replace any light bulbs as soon as they burn out.  Set up your furniture so you have a clear path. Avoid moving your furniture around.  If any of your floors are uneven, fix them.  If there are any pets around you, be aware of where they are.  Review your medicines with your doctor. Some medicines can make you feel dizzy. This can increase your chance of falling. Ask your doctor what other things that you can do to help prevent falls. This information is not intended to replace advice given to you by your health care provider. Make sure you discuss any questions you have with your health care provider. Document Released: 02/22/2009 Document Revised: 10/04/2015 Document Reviewed: 06/02/2014 Elsevier Interactive Patient Education  2017 [employer].

## 2018-06-25 ENCOUNTER — Ambulatory Visit: Payer: Medicare Other | Admitting: Neurology

## 2018-06-25 ENCOUNTER — Other Ambulatory Visit: Payer: Self-pay

## 2018-06-25 ENCOUNTER — Other Ambulatory Visit: Payer: Self-pay | Admitting: Family Medicine

## 2018-06-25 ENCOUNTER — Encounter: Payer: Self-pay | Admitting: Neurology

## 2018-06-25 VITALS — BP 96/58 | HR 58 | Ht 70.0 in | Wt 156.0 lb

## 2018-06-25 DIAGNOSIS — F03A0 Unspecified dementia, mild, without behavioral disturbance, psychotic disturbance, mood disturbance, and anxiety: Secondary | ICD-10-CM

## 2018-06-25 DIAGNOSIS — F0390 Unspecified dementia without behavioral disturbance: Secondary | ICD-10-CM

## 2018-06-25 DIAGNOSIS — M159 Polyosteoarthritis, unspecified: Secondary | ICD-10-CM

## 2018-06-25 DIAGNOSIS — G8929 Other chronic pain: Secondary | ICD-10-CM

## 2018-06-25 DIAGNOSIS — M25561 Pain in right knee: Secondary | ICD-10-CM

## 2018-06-25 DIAGNOSIS — M150 Primary generalized (osteo)arthritis: Principal | ICD-10-CM

## 2018-06-25 DIAGNOSIS — M8949 Other hypertrophic osteoarthropathy, multiple sites: Secondary | ICD-10-CM

## 2018-06-25 DIAGNOSIS — M25551 Pain in right hip: Secondary | ICD-10-CM

## 2018-06-25 MED ORDER — DONEPEZIL HCL 10 MG PO TABS: ORAL_TABLET | ORAL | 3 refills | Status: DC
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
## 2018-06-25 NOTE — Patient Instructions (Addendum)
1. Continue Donepezil 10mg  daily 2. Have a great trip! Follow-up in 6 months, call for any changes  FALL PRECAUTIONS: Be cautious when walking. Scan the area for obstacles that may increase the risk of trips and falls. When getting up in the mornings, sit up at the edge of the bed for a few minutes before getting out of bed. Consider elevating the bed at the head end to avoid drop of blood pressure when getting up. Walk always in a well-lit room (use night lights in the walls). Avoid area rugs or power cords from appliances in the middle of the walkways. Use a walker or a cane if necessary and consider physical therapy for balance exercise. Get your eyesight checked regularly.  FINANCIAL OVERSIGHT: Supervision, especially oversight when making financial decisions or transactions is also recommended.  HOME SAFETY: Consider the safety of the kitchen when operating appliances like stoves, microwave oven, and blender. Consider having supervision and share cooking responsibilities until no longer able to participate in those. Accidents with firearms and other hazards in the house should be identified and addressed as well.  ABILITY TO BE LEFT ALONE: If patient is unable to contact 911 operator, consider using LifeLine, or when the need is there, arrange for someone to stay with patients. Smoking is a fire hazard, consider supervision or cessation. Risk of wandering should be assessed by caregiver and if detected at any point, supervision and safe proof recommendations should be instituted.  MEDICATION SUPERVISION: Inability to self-administer medication needs to be constantly addressed. Implement a mechanism to ensure safe administration of the medications.  RECOMMENDATIONS FOR ALL PATIENTS WITH MEMORY PROBLEMS: 1. Continue to exercise (Recommend 30 minutes of walking everyday, or 3 hours every week) 2. Increase social interactions - continue going to [place] and enjoy social gatherings with friends and  family 3. Eat healthy, avoid fried foods and eat more fruits and vegetables 4. Maintain adequate blood pressure, blood sugar, and blood cholesterol level. Reducing the risk of stroke and cardiovascular disease also helps promoting better memory. 5. Avoid stressful situations. Live a simple life and avoid aggravations. Organize your time and prepare for the next day in anticipation. 6. Sleep well, avoid any interruptions of sleep and avoid any distractions in the bedroom that may interfere with adequate sleep quality 7. Avoid sugar, avoid sweets as there is a strong link between excessive sugar intake, diabetes, and cognitive impairment The Mediterranean diet has been shown to help patients reduce the risk of progressive memory disorders and reduces cardiovascular risk. This includes eating fish, eat fruits and green leafy vegetables, nuts like almonds and hazelnuts, walnuts, and also use olive oil. Avoid fast foods and fried foods as much as possible. Avoid sweets and sugar as sugar use has been linked to worsening of memory function.  There is always a concern of gradual progression of memory problems. If this is the case, then we may need to adjust level of care according to patient needs. Support, both to the patient and caregiver, should then be put into place.

## 2018-07-02 ENCOUNTER — Encounter: Payer: Self-pay | Admitting: Neurology

## 2018-11-16 ENCOUNTER — Other Ambulatory Visit: Payer: Self-pay

## 2018-11-16 ENCOUNTER — Other Ambulatory Visit: Payer: Medicare Other

## 2018-11-16 DIAGNOSIS — R7309 Other abnormal glucose: Secondary | ICD-10-CM

## 2018-11-16 DIAGNOSIS — Z0000 Encounter for general adult medical examination without abnormal findings: Secondary | ICD-10-CM

## 2018-11-16 DIAGNOSIS — N138 Other obstructive and reflux uropathy: Secondary | ICD-10-CM

## 2018-11-16 DIAGNOSIS — E559 Vitamin D deficiency, unspecified: Secondary | ICD-10-CM | POA: Diagnosis not present

## 2018-11-16 DIAGNOSIS — G3183 Dementia with Lewy bodies: Secondary | ICD-10-CM

## 2018-11-16 DIAGNOSIS — E034 Atrophy of thyroid (acquired): Secondary | ICD-10-CM | POA: Diagnosis not present

## 2018-11-16 DIAGNOSIS — F0280 Dementia in other diseases classified elsewhere without behavioral disturbance: Secondary | ICD-10-CM

## 2018-11-16 DIAGNOSIS — E782 Mixed hyperlipidemia: Secondary | ICD-10-CM

## 2018-11-16 DIAGNOSIS — R972 Elevated prostate specific antigen [PSA]: Secondary | ICD-10-CM

## 2018-11-16 DIAGNOSIS — N401 Enlarged prostate with lower urinary tract symptoms: Secondary | ICD-10-CM

## 2018-11-17 ENCOUNTER — Other Ambulatory Visit: Payer: Medicare Other

## 2018-11-17 LAB — COMPLETE METABOLIC PANEL WITH GFR
AG Ratio: 1.6 (calc) (ref 1.0–2.5)
ALT: 9 U/L (ref 9–46)
AST: 16 U/L (ref 10–35)
Albumin: 4.1 g/dL (ref 3.6–5.1)
Alkaline phosphatase (APISO): 66 U/L (ref 35–144)
BUN/Creatinine Ratio: 11 (calc) (ref 6–22)
BUN: 15 mg/dL (ref 7–25)
CO2: 28 mmol/L (ref 20–32)
Calcium: 9 mg/dL (ref 8.6–10.3)
Chloride: 102 mmol/L (ref 98–110)
Creat: 1.38 mg/dL — ABNORMAL HIGH (ref 0.70–1.18)
GFR, Est African American: 56 mL/min/{1.73_m2} — ABNORMAL LOW (ref >=60)
GFR, Est Non African American: 49 mL/min/{1.73_m2} — ABNORMAL LOW (ref >=60)
Globulin: 2.5 g/dL (calc) (ref 1.9–3.7)
Glucose, Bld: 78 mg/dL (ref 65–99)
Potassium: 4.2 mmol/L (ref 3.5–5.3)
Sodium: 139 mmol/L (ref 135–146)
Total Bilirubin: 1 mg/dL (ref 0.2–1.2)
Total Protein: 6.6 g/dL (ref 6.1–8.1)

## 2018-11-17 LAB — CBC WITH DIFFERENTIAL/PLATELET
Absolute Monocytes: 634 cells/uL (ref 200–950)
Basophils Absolute: 53 cells/uL (ref 0–200)
Basophils Relative: 0.8 %
Eosinophils Absolute: 139 cells/uL (ref 15–500)
Eosinophils Relative: 2.1 %
HCT: 39.8 % (ref 38.5–50.0)
Hemoglobin: 13.3 g/dL (ref 13.2–17.1)
Lymphs Abs: 1439 cells/uL (ref 850–3900)
MCH: 31.6 pg (ref 27.0–33.0)
MCHC: 33.4 g/dL (ref 32.0–36.0)
MCV: 94.5 fL (ref 80.0–100.0)
MPV: 10.1 fL (ref 7.5–12.5)
Monocytes Relative: 9.6 %
Neutro Abs: 4336 cells/uL (ref 1500–7800)
Neutrophils Relative %: 65.7 %
Platelets: 241 10*3/uL (ref 140–400)
RBC: 4.21 10*6/uL (ref 4.20–5.80)
RDW: 12.9 % (ref 11.0–15.0)
Total Lymphocyte: 21.8 %
WBC: 6.6 10*3/uL (ref 3.8–10.8)

## 2018-11-17 LAB — LIPID PANEL
Cholesterol: 244 mg/dL — ABNORMAL HIGH (ref <200)
HDL: 76 mg/dL (ref >=40)
LDL Cholesterol (Calc): 149 mg/dL (calc) — ABNORMAL HIGH
Non-HDL Cholesterol (Calc): 168 mg/dL (calc) — ABNORMAL HIGH (ref <130)
Total CHOL/HDL Ratio: 3.2 (calc) (ref <5.0)
Triglycerides: 83 mg/dL (ref <150)

## 2018-11-17 LAB — HEMOGLOBIN A1C
Hgb A1c MFr Bld: 5.4 % of total Hgb (ref <5.7)
Mean Plasma Glucose: 108 (calc)
eAG (mmol/L): 6 (calc)

## 2018-11-17 LAB — REFLEX PSA, FREE
PSA, % Free: 20 % (calc) — ABNORMAL LOW (ref >25)
PSA, Free: 0.9 ng/mL

## 2018-11-17 LAB — T4, FREE: Free T4: 1.4 ng/dL (ref 0.8–1.8)

## 2018-11-17 LAB — TSH: TSH: 1.88 mIU/L (ref 0.40–4.50)

## 2018-11-17 LAB — VITAMIN D 25 HYDROXY (VIT D DEFICIENCY, FRACTURES): Vit D, 25-Hydroxy: 89 ng/mL (ref 30–100)

## 2018-11-17 LAB — PSA, TOTAL WITH REFLEX TO PSA, FREE: PSA, Total: 4.5 ng/mL — ABNORMAL HIGH (ref <=4.0)

## 2018-11-24 ENCOUNTER — Encounter: Payer: Self-pay | Admitting: Family Medicine

## 2018-11-24 ENCOUNTER — Ambulatory Visit (INDEPENDENT_AMBULATORY_CARE_PROVIDER_SITE_OTHER): Payer: Medicare Other | Admitting: Family Medicine

## 2018-11-24 ENCOUNTER — Other Ambulatory Visit: Payer: Self-pay

## 2018-11-24 VITALS — BP 126/62 | HR 58 | Temp 97.8°F | Ht 70.0 in | Wt 155.4 lb

## 2018-11-24 DIAGNOSIS — N138 Other obstructive and reflux uropathy: Secondary | ICD-10-CM

## 2018-11-24 DIAGNOSIS — M150 Primary generalized (osteo)arthritis: Secondary | ICD-10-CM

## 2018-11-24 DIAGNOSIS — E034 Atrophy of thyroid (acquired): Secondary | ICD-10-CM | POA: Diagnosis not present

## 2018-11-24 DIAGNOSIS — M810 Age-related osteoporosis without current pathological fracture: Secondary | ICD-10-CM | POA: Diagnosis not present

## 2018-11-24 DIAGNOSIS — R972 Elevated prostate specific antigen [PSA]: Secondary | ICD-10-CM

## 2018-11-24 DIAGNOSIS — Z0000 Encounter for general adult medical examination without abnormal findings: Secondary | ICD-10-CM

## 2018-11-24 DIAGNOSIS — N183 Chronic kidney disease, stage 3 unspecified: Secondary | ICD-10-CM

## 2018-11-24 DIAGNOSIS — G3183 Dementia with Lewy bodies: Secondary | ICD-10-CM

## 2018-11-24 DIAGNOSIS — M159 Polyosteoarthritis, unspecified: Secondary | ICD-10-CM

## 2018-11-24 DIAGNOSIS — N401 Enlarged prostate with lower urinary tract symptoms: Secondary | ICD-10-CM

## 2018-11-24 DIAGNOSIS — M8949 Other hypertrophic osteoarthropathy, multiple sites: Secondary | ICD-10-CM

## 2018-11-24 DIAGNOSIS — F0280 Dementia in other diseases classified elsewhere without behavioral disturbance: Secondary | ICD-10-CM

## 2018-11-24 NOTE — Patient Instructions (Addendum)
Thank you for coming to the office today.  Keep active with walking and following with Neurology Dr Delice Lesch, sounds like making good progress.  Ask about stiffness in joints and walking / can be parkinson type symptoms to discuss other tests or treatment options  -------------------------------------  Elevated cholesterol, will avoid starting cholesterol med today due to side effects  Previous osteoporosis and thinner bones - will re-chec  For DEXA Scan (Bone mineral density) screening for osteoporosis  Call the [place] below anytime to schedule your own appointment now that order has been placed.  [facility] [place], [place] [address] Phone: [phone]  DUE for NON FASTING BLOOD WORK   SCHEDULE "Lab Only" visit in the morning at the clinic for lab draw in 6 MONTHS   - Make sure Lab Only appointment is at about 1 week before your next appointment, so that results will be available  For Lab Results, once available within 2-3 days of blood draw, you can can log in to MyChart online to view your results and a brief explanation. Also, we can discuss results at next follow-up visit.    Please schedule a Follow-up Appointment to: Return in about 6 months (around 05/27/2019) for 6 month Kidney function, PSA, lab results, neuro f/u.  If you have any other questions or concerns, please feel free to call the office or send a message through [place]. You may also schedule an earlier appointment if necessary.  Additionally, you may be receiving a survey about your experience at our office within a few days to 1 week by e-mail or mail. We value your feedback.  [name], DO [place]

## 2018-11-24 NOTE — Progress Notes (Signed)
Subjective:    Patient ID: Blake Bates, male    DOB: [birth year], 78 y.o.   MRN: [id number]  Blake Bates is a 78 y.o. male presenting on 11/24/2018 for Annual Exam   HPI   Accompanied by wife, [name], for additional history.  Here for Annual Physical and Lab Review.    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  Elevated BP without dx HTN Last visit had elevated BP. Prior Chart review shows prior normal readings of BP. Home readings normal. Improved since last visit  Not taking anti HTN medications Denies CP, dyspnea, HA, edema, dizziness / lightheadedness   Health Maintenance:  Previously Zostavax, Interested in [place], last 2015, T-1.7 osteopenia femur.     Depression screen [facility] 2/9 11/24/2018 06/15/2018 05/25/2018  Decreased Interest 0 0 0  Down, Depressed, Hopeless 0 0 0  PHQ - 2 Score 0 0 0  Altered sleeping 0 - -  Tired, decreased energy 0 - -  Change in appetite 0 - -  Feeling bad or failure about yourself  0 - -  Trouble concentrating 0 - -  Moving slowly or fidgety/restless 0 - -  Suicidal thoughts 0 - -  PHQ-9 Score 0 - -  Difficult doing work/chores Not difficult at all - -    Past Medical History:   Diagnosis Date  . Asthma   . Diverticulosis   . Full dentures   . Hyperlipidemia   . Hypothyroidism    s/p subtotal thyroidectomy  . Muscle spasms of lower extremity    Right upper leg  . Osteoporosis   . Rhinitis, allergic    pollens, mold, animal dander   Past Surgical History:  Procedure Laterality Date  . CATARACT EXTRACTION W/PHACO Left 09/20/2014   Procedure: CATARACT EXTRACTION PHACO AND INTRAOCULAR LENS PLACEMENT (IOC);  Surgeon: Leandrew Koyanagi, MD;  Location: [facility];  Service: Ophthalmology;  Laterality: Left;  . COLONOSCOPY    . PARATHYROIDECTOMY    . THYROID SURGERY     subtotal   Social History   Socioeconomic History  . Marital status: Married    Spouse name: Not on file  . Number of children: Not on file  . Years of education: Not on file  . Highest education level: Doctorate  Occupational History  . Occupation: retired  Scientific laboratory technician  . Financial resource strain: Not hard at all  . Food insecurity    Worry: Never true    Inability: Never true  . Transportation needs    Medical: No    Non-medical: No  Tobacco Use  . Smoking status: Former Smoker    Packs/day: 0.00    Years: 60.00    Pack years: 0.00    Types: Pipe    Quit date: 05/12/2017    Years since quitting: 1.5  .  Smokeless tobacco: Never Used  . Tobacco comment: Self taper down from full pipe daily down to half to quarter pipe  Substance and Sexual Activity  . Alcohol use: Not Currently    Comment: occasionally beer or wine   . Drug use: No  . Sexual activity: Not on file  Lifestyle  . Physical activity    Days per week: 7 days    Minutes per session: 60 min  . Stress: Not at all  Relationships  . Social connections    Talks on phone: More than three times a week    Gets together: More than three times a week    Attends religious service: More than 4 times per year    Active member of club or organization: Yes    Attends meetings of clubs or organizations: More than  4 times per year    Relationship status: Married  . Intimate partner violence    Fear of current or ex partner: No    Emotionally abused: No    Physically abused: No    Forced sexual activity: No  Other Topics Concern  . Not on file  Social History Narrative   Clubs- [employer]       Pt lives in 1 story home with his wife   Has 2 surviving children   PhD in Biology   Retired professor   Family History  Problem Relation Age of Onset  . Stroke Mother 40  . Lung cancer Father 29  . Prostate cancer Paternal Grandfather 88  . Stroke Paternal Aunt    Current Outpatient Medications on File Prior to Visit  Medication Sig  . Acetaminophen (TYLENOL ARTHRITIS PAIN PO) Take by mouth. Once a day  . albuterol (PROVENTIL HFA;VENTOLIN HFA) 108 (90 BASE) MCG/ACT inhaler Inhale into the lungs every 6 (six) hours as needed for wheezing or shortness of breath.  [name] aspirin EC 81 MG tablet Take 1 tablet (81 mg total) by mouth daily.  . Cholecalciferol (VITAMIN D-3 PO) Take 5,000 Units/oz/day by mouth daily. PM   . Cyanocobalamin (VITAMIN B-12) 5000 MCG SUBL Place under the tongue daily.  [name] donepezil (ARICEPT) 10 MG tablet Take 1 tablet daily  . levothyroxine (SYNTHROID, LEVOTHROID) 50 MCG tablet Take 1 tablet (50 mcg total) by mouth daily before breakfast.  . loratadine (CLARITIN) 10 MG tablet Take 10 mg by mouth daily as needed for allergies. PM  . meloxicam (MOBIC) 15 MG tablet TAKE 1 TABLET BY MOUTH DAILY AS NEEDED FOR PAIN. FOR ARTHRITIS FARE, TAKE UP TO 1 TO 2 WEEK AT A TIME   No current facility-administered medications on file prior to visit.     Review of Systems  Constitutional: Negative for activity change, appetite change, chills, diaphoresis, fatigue and fever.  HENT: Negative for congestion and hearing loss.   Eyes: Negative for visual disturbance.  Respiratory: Negative for cough, chest tightness, shortness of breath and wheezing.   Cardiovascular: Negative for chest pain,  palpitations and leg swelling.  Gastrointestinal: Negative for abdominal pain, constipation, diarrhea, nausea and vomiting.  Genitourinary: Negative for dysuria, frequency and hematuria.  Musculoskeletal: Negative for arthralgias and neck pain.  Skin: Negative for rash.  Neurological: Negative for dizziness, weakness, light-headedness, numbness and headaches.  Hematological: Negative for adenopathy.  Psychiatric/Behavioral: Negative for behavioral problems, dysphoric mood and sleep disturbance.   Per HPI unless specifically indicated above      Objective:    BP 126/62 (BP Location: Right Arm, Patient Position: Sitting, Cuff Size:  Normal)   Pulse (!) 58   Temp 97.8 F (36.6 C) (Oral)   Ht 5\' 10"  (1.778 m)   Wt 155 lb 6.4 oz (70.5 kg)   BMI 22.30 kg/m   Wt Readings from Last 3 Encounters:  11/24/18 155 lb 6.4 oz (70.5 kg)  06/25/18 156 lb (70.8 kg)  06/15/18 156 lb 9.6 oz (71 kg)    Physical Exam Vitals signs and nursing note reviewed.  Constitutional:      General: He is not in acute distress.    Appearance: He is well-developed. He is not diaphoretic.     Comments: Well-appearing elderly 78 year old male, comfortable, cooperative, thin appearing  HENT:     Head: Normocephalic and atraumatic.  Eyes:     General:        Right eye: No discharge.        Left eye: No discharge.     Conjunctiva/sclera: Conjunctivae normal.     Pupils: Pupils are equal, round, and reactive to light.  Neck:     Musculoskeletal: Normal range of motion and neck supple.     Thyroid: No thyromegaly.  Cardiovascular:     Rate and Rhythm: Normal rate and regular rhythm.     Heart sounds: Normal heart sounds. No murmur.  Pulmonary:     Effort: Pulmonary effort is normal. No respiratory distress.     Breath sounds: Normal breath sounds. No wheezing or rales.  Abdominal:     General: Bowel sounds are normal. There is no distension.     Palpations: Abdomen is soft. There is no mass.      Tenderness: There is no abdominal tenderness.  Musculoskeletal:        General: No tenderness.     Comments: Upper / Lower Extremities: - Normal muscle tone, strength bilateral upper extremities 5/5, lower extremities 5/5  Some stiffness and reduced range of motion with in bilateral lower and upper extremity  Lymphadenopathy:     Cervical: No cervical adenopathy.  Skin:    General: Skin is warm and dry.     Findings: No erythema or rash.  Neurological:     Mental Status: He is alert and oriented to person, place, and time.     Comments: Distal sensation intact to light touch all extremities  Psychiatric:        Behavior: Behavior normal.     Comments: Well groomed, good eye contact, normal speech and thoughts. Follows conversational well and seems more able to participate in conversation.        Results for orders placed or performed in visit on 11/16/18  T4, free  Result Value Ref Range   Free T4 1.4 0.8 - 1.8 ng/dL  TSH  Result Value Ref Range   TSH 1.88 0.40 - 4.50 mIU/L  PSA, Total with Reflex to PSA, Free  Result Value Ref Range   PSA, Total 4.5 (H) < OR = 4.0 ng/mL  VITAMIN D 25 Hydroxy (Vit-D Deficiency, Fractures)  Result Value Ref Range   Vit D, 25-Hydroxy 89 30 - 100 ng/mL  Lipid panel  Result Value Ref Range   Cholesterol 244 (H) <200 mg/dL   HDL 76 > OR = 40 mg/dL   Triglycerides 83 <150 mg/dL   LDL Cholesterol (Calc) 149 (H) mg/dL (calc)   Total CHOL/HDL Ratio 3.2 <5.0 (calc)   Non-HDL Cholesterol (Calc) 168 (H) <130 mg/dL (calc)  COMPLETE METABOLIC PANEL WITH GFR  Result Value Ref Range   Glucose, Bld  78 65 - 99 mg/dL   BUN 15 7 - 25 mg/dL   Creat 1.38 (H) 0.70 - 1.18 mg/dL   GFR, Est Non African American 49 (L) > OR = 60 mL/min/1.40m2   GFR, Est African American 56 (L) > OR = 60 mL/min/1.69m2   BUN/Creatinine Ratio 11 6 - 22 (calc)   Sodium 139 135 - 146 mmol/L   Potassium 4.2 3.5 - 5.3 mmol/L   Chloride 102 98 - 110 mmol/L   CO2 28 20 - 32 mmol/L    Calcium 9.0 8.6 - 10.3 mg/dL   Total Protein 6.6 6.1 - 8.1 g/dL   Albumin 4.1 3.6 - 5.1 g/dL   Globulin 2.5 1.9 - 3.7 g/dL (calc)   AG Ratio 1.6 1.0 - 2.5 (calc)   Total Bilirubin 1.0 0.2 - 1.2 mg/dL   Alkaline phosphatase (APISO) 66 35 - 144 U/L   AST 16 10 - 35 U/L   ALT 9 9 - 46 U/L  CBC with Differential/Platelet  Result Value Ref Range   WBC 6.6 3.8 - 10.8 Thousand/uL   RBC 4.21 4.20 - 5.80 Million/uL   Hemoglobin 13.3 13.2 - 17.1 g/dL   HCT 39.8 38.5 - 50.0 %   MCV 94.5 80.0 - 100.0 fL   MCH 31.6 27.0 - 33.0 pg   MCHC 33.4 32.0 - 36.0 g/dL   RDW 12.9 11.0 - 15.0 %   Platelets 241 140 - 400 Thousand/uL   MPV 10.1 7.5 - 12.5 fL   Neutro Abs 4,336 1,500 - 7,800 cells/uL   Lymphs Abs 1,439 850 - 3,900 cells/uL   Absolute Monocytes 634 200 - 950 cells/uL   Eosinophils Absolute 139 15 - 500 cells/uL   Basophils Absolute 53 0 - 200 cells/uL   Neutrophils Relative % 65.7 %   Total Lymphocyte 21.8 %   Monocytes Relative 9.6 %   Eosinophils Relative 2.1 %   Basophils Relative 0.8 %  Hemoglobin A1c  Result Value Ref Range   Hgb A1c MFr Bld 5.4 <5.7 % of total Hgb   Mean Plasma Glucose 108 (calc)   eAG (mmol/L) 6.0 (calc)  reflex PSA, Free  Result Value Ref Range   PSA, Free 0.9 ng/mL   PSA, % Free 20 (L) >25 % (calc)      Assessment & Plan:   Problem List Items Addressed This Visit    BPH with obstruction/lower urinary tract symptoms   CKD (chronic kidney disease), stage III (HCC)    Stable CKD-III Likely etiology age w/o HTN, DM Limit NSAIDs for OA/DJD - agree to refill Meloxicam proceed with caution, intermittent use advised - < 1-2 week at a time, interval dosing only Continue regular Tylenol dosing up to 1000mg  TID  Follow-up Cr trend in 6 months w/ labs      Elevated PSA    Stable PSA 4 range Previously >6 Check q 6-12 mo      Hypothyroidism    Stable, controlled Continue Levothyroxine 72mcg daily      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)    Stable cognitive decline/dementia Gradual worsening neuromuscular symptoms with stiffness [facility]ed by [facility] Neurology / Neuropsych - 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 Improved on Aricept 10mg  daily  Encouraged to continue current course, Aricept 10mg , no med change today, continue with current improved activity walking, companionship, cognitive stimulation and activity - He should follow-up with Dr Delice Lesch as scheduled discuss possible parkinson concerns with gradual worsening neuromuscular symptoms  Osteoarthritis of multiple joints    Stable chronic problem Likely contributing to joint stiffness symptoms Will treat more aggressively in future, if determined less neuromuscular symptoms      Osteoporosis    Ordered update DEXA 03/2019, patient to schedule when ready      Relevant Orders   DG Bone Density    Other Visit Diagnoses    Annual physical exam    -  Primary      Updated Health Maintenance information Reviewed recent lab results with patient Encouraged improvement to lifestyle with diet and exercise   No orders of the defined types were placed in this encounter.   Follow up plan: Return in about 6 months (around 05/27/2019) for 6 month Kidney function, PSA, lab results, neuro f/u.   Future 6 months BMET Cr, PSA  [name], DO [facility] 11/24/2018, 9:32 AM

## 2018-11-25 ENCOUNTER — Other Ambulatory Visit: Payer: Self-pay | Admitting: Family Medicine

## 2018-11-25 DIAGNOSIS — G3183 Dementia with Lewy bodies: Secondary | ICD-10-CM

## 2018-11-25 DIAGNOSIS — R972 Elevated prostate specific antigen [PSA]: Secondary | ICD-10-CM

## 2018-11-25 DIAGNOSIS — N183 Chronic kidney disease, stage 3 unspecified: Secondary | ICD-10-CM

## 2018-11-25 DIAGNOSIS — F0280 Dementia in other diseases classified elsewhere without behavioral disturbance: Secondary | ICD-10-CM

## 2018-11-25 NOTE — Assessment & Plan Note (Signed)
Stable PSA 4 range Previously >6 Check q 6-12 mo

## 2018-11-25 NOTE — Assessment & Plan Note (Addendum)
Stable chronic problem Likely contributing to joint stiffness symptoms Will treat more aggressively in future, if determined less neuromuscular symptoms

## 2018-11-25 NOTE — Assessment & Plan Note (Signed)
Stable, controlled Continue Levothyroxine 56mcg daily

## 2018-11-25 NOTE — Assessment & Plan Note (Signed)
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

## 2018-11-25 NOTE — Assessment & Plan Note (Signed)
Ordered update DEXA 03/2019, patient to schedule when ready

## 2018-11-25 NOTE — Assessment & Plan Note (Signed)
Stable CKD-III Likely etiology age w/o HTN, DM Limit NSAIDs for OA/DJD - agree to refill Meloxicam proceed with caution, intermittent use advised - < 1-2 week at a time, interval dosing only Continue regular Tylenol dosing up to 1000mg  TID  Follow-up Cr trend in 6 months w/ labs

## 2018-12-27 ENCOUNTER — Other Ambulatory Visit: Payer: Self-pay | Admitting: Family Medicine

## 2018-12-27 DIAGNOSIS — M8949 Other hypertrophic osteoarthropathy, multiple sites: Secondary | ICD-10-CM

## 2018-12-27 DIAGNOSIS — G8929 Other chronic pain: Secondary | ICD-10-CM

## 2018-12-27 DIAGNOSIS — M25551 Pain in right hip: Secondary | ICD-10-CM

## 2018-12-27 DIAGNOSIS — M159 Polyosteoarthritis, unspecified: Secondary | ICD-10-CM

## 2018-12-27 MED ORDER — MELOXICAM 15 MG PO TABS: ORAL_TABLET | ORAL | 1 refills | Status: DC

## 2019-01-03 ENCOUNTER — Ambulatory Visit (INDEPENDENT_AMBULATORY_CARE_PROVIDER_SITE_OTHER): Payer: Medicare Other | Admitting: Podiatry

## 2019-01-03 ENCOUNTER — Encounter: Payer: Self-pay | Admitting: Podiatry

## 2019-01-03 ENCOUNTER — Other Ambulatory Visit: Payer: Self-pay

## 2019-01-03 DIAGNOSIS — B351 Tinea unguium: Secondary | ICD-10-CM | POA: Diagnosis not present

## 2019-01-03 DIAGNOSIS — M79674 Pain in right toe(s): Secondary | ICD-10-CM | POA: Diagnosis not present

## 2019-01-03 DIAGNOSIS — M79675 Pain in left toe(s): Secondary | ICD-10-CM

## 2019-01-03 NOTE — Progress Notes (Signed)
Complaint:  Visit Type: Patient presents  to my office for preventative foot care services. Complaint: Patient states" my nails have grown long and thick and become painful to walk and wear shoes"  Patient presents to the office with his wife. The patient presents for preventative foot care services. No changes to ROS  Podiatric Exam: Vascular: dorsalis pedis and posterior tibial pulses are weakly  palpable bilateral. Capillary return is immediate. Temperature gradient is WNL. Skin turgor WNL  Sensorium: Normal Semmes Weinstein monofilament test. Normal tactile sensation bilaterally. Nail Exam: Pt has thick disfigured discolored nails with subungual debris noted bilateral entire nail hallux through fifth toenails Ulcer Exam: There is no evidence of ulcer or pre-ulcerative changes or infection. Orthopedic Exam: Muscle tone and strength are WNL. No limitations in general ROM. No crepitus or effusions noted. Foot type and digits show no abnormalities. HAV  B/L. Skin: No Porokeratosis. No infection or ulcers  Diagnosis:  Onychomycosis, , Pain in right toe, pain in left toes  Treatment & Plan Procedures and Treatment: Consent by patient was obtained for treatment procedures.   Debridement of mycotic and hypertrophic toenails, 1 through 5 bilateral and clearing of subungual debris. No ulceration, no infection noted.  Return Visit-Office Procedure: Patient instructed to return to the office for a follow up visit 3 months for continued evaluation and treatment.    Gardiner Barefoot DPM

## 2019-02-07 ENCOUNTER — Other Ambulatory Visit: Payer: Self-pay

## 2019-02-07 ENCOUNTER — Ambulatory Visit (INDEPENDENT_AMBULATORY_CARE_PROVIDER_SITE_OTHER): Payer: Medicare Other | Admitting: Neurology

## 2019-02-07 ENCOUNTER — Encounter: Payer: Self-pay | Admitting: Neurology

## 2019-02-07 VITALS — BP 147/82 | HR 76 | Ht 70.0 in | Wt 151.5 lb

## 2019-02-07 DIAGNOSIS — R2681 Unsteadiness on feet: Secondary | ICD-10-CM

## 2019-02-07 DIAGNOSIS — F0390 Unspecified dementia without behavioral disturbance: Secondary | ICD-10-CM

## 2019-02-07 DIAGNOSIS — F03A0 Unspecified dementia, mild, without behavioral disturbance, psychotic disturbance, mood disturbance, and anxiety: Secondary | ICD-10-CM

## 2019-02-07 MED ORDER — MEMANTINE HCL 10 MG PO TABS: ORAL_TABLET | ORAL | 11 refills | Status: DC

## 2019-02-07 MED ORDER — DONEPEZIL HCL 10 MG PO TABS: ORAL_TABLET | ORAL | 3 refills | Status: DC
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
## 2019-02-07 NOTE — Patient Instructions (Signed)
1. Refer to [facility] rehab at [facility] for PT/balance therapy  2. Start Memantine 10mg : take 1 tablet every night 2 weeks, then increase to 1 tablet twice a day  3. Continue Donepezil 10mg  daily  4. Follow-up in 6 months, call for any changes  FALL PRECAUTIONS: Be cautious when walking. Scan the area for obstacles that may increase the risk of trips and falls. When getting up in the mornings, sit up at the edge of the bed for a few minutes before getting out of bed. Consider elevating the bed at the head end to avoid drop of blood pressure when getting up. Walk always in a well-lit room (use night lights in the walls). Avoid area rugs or power cords from appliances in the middle of the walkways. Use a walker or a cane if necessary and consider physical therapy for balance exercise. Get your eyesight checked regularly.  FINANCIAL OVERSIGHT: Supervision, especially oversight when making financial decisions or transactions is also recommended.  HOME SAFETY: Consider the safety of the kitchen when operating appliances like stoves, microwave oven, and blender. Consider having supervision and share cooking responsibilities until no longer able to participate in those. Accidents with firearms and other hazards in the house should be identified and addressed as well.  ABILITY TO BE LEFT ALONE: If patient is unable to contact 911 operator, consider using LifeLine, or when the need is there, arrange for someone to stay with patients. Smoking is a fire hazard, consider supervision or cessation. Risk of wandering should be assessed by caregiver and if detected at any point, supervision and safe proof recommendations should be instituted.  MEDICATION SUPERVISION: Inability to self-administer medication needs to be constantly addressed. Implement a mechanism to ensure safe administration of the medications.  RECOMMENDATIONS FOR ALL PATIENTS WITH MEMORY PROBLEMS: 1. Continue to exercise (Recommend 30 minutes of walking  everyday, or 3 hours every week) 2. Increase social interactions - continue going to [place] and enjoy social gatherings with friends and family 3. Eat healthy, avoid fried foods and eat more fruits and vegetables 4. Maintain adequate blood pressure, blood sugar, and blood cholesterol level. Reducing the risk of stroke and cardiovascular disease also helps promoting better memory. 5. Avoid stressful situations. Live a simple life and avoid aggravations. Organize your time and prepare for the next day in anticipation. 6. Sleep well, avoid any interruptions of sleep and avoid any distractions in the bedroom that may interfere with adequate sleep quality 7. Avoid sugar, avoid sweets as there is a strong link between excessive sugar intake, diabetes, and cognitive impairment The Mediterranean diet has been shown to help patients reduce the risk of progressive memory disorders and reduces cardiovascular risk. This includes eating fish, eat fruits and green leafy vegetables, nuts like almonds and hazelnuts, walnuts, and also use olive oil. Avoid fast foods and fried foods as much as possible. Avoid sweets and sugar as sugar use has been linked to worsening of memory function.  There is always a concern of gradual progression of memory problems. If this is the case, then we may need to adjust level of care according to patient needs. Support, both to the patient and caregiver, should then be put into place.

## 2019-02-11 ENCOUNTER — Encounter: Payer: Self-pay | Admitting: Neurology

## 2019-03-10 ENCOUNTER — Telehealth: Payer: Self-pay

## 2019-03-10 NOTE — Telephone Encounter (Signed)
Received fax from [facility] PT. They were unable to contact pt for an appt date date and time.

## 2019-03-31 DIAGNOSIS — D3132 Benign neoplasm of left choroid: Secondary | ICD-10-CM | POA: Diagnosis not present

## 2019-04-04 ENCOUNTER — Other Ambulatory Visit: Payer: Self-pay

## 2019-04-04 ENCOUNTER — Encounter: Payer: Self-pay | Admitting: Podiatry

## 2019-04-04 ENCOUNTER — Ambulatory Visit (INDEPENDENT_AMBULATORY_CARE_PROVIDER_SITE_OTHER): Payer: Medicare Other | Admitting: Podiatry

## 2019-04-04 DIAGNOSIS — B351 Tinea unguium: Secondary | ICD-10-CM | POA: Diagnosis not present

## 2019-04-04 DIAGNOSIS — M79675 Pain in left toe(s): Secondary | ICD-10-CM | POA: Diagnosis not present

## 2019-04-04 DIAGNOSIS — M79674 Pain in right toe(s): Secondary | ICD-10-CM

## 2019-04-04 NOTE — Progress Notes (Signed)
Complaint:  Visit Type: Patient presents  to my office for preventative foot care services. Complaint: Patient states" my nails have grown long and thick and become painful to walk and wear shoes"  Patient presents to the office with his wife. The patient presents for preventative foot care services. No changes to ROS  Podiatric Exam: Vascular: dorsalis pedis and posterior tibial pulses are weakly  palpable bilateral. Capillary return is immediate. Temperature gradient is WNL. Skin turgor WNL  Sensorium: Normal Semmes Weinstein monofilament test. Normal tactile sensation bilaterally. Nail Exam: Pt has thick disfigured discolored nails with subungual debris noted bilateral entire nail hallux through fifth toenails Ulcer Exam: There is no evidence of ulcer or pre-ulcerative changes or infection. Orthopedic Exam: Muscle tone and strength are WNL. No limitations in general ROM. No crepitus or effusions noted. Foot type and digits show no abnormalities. HAV  B/L. Skin: No Porokeratosis. No infection or ulcers  Diagnosis:  Onychomycosis, , Pain in right toe, pain in left toes  Treatment & Plan Procedures and Treatment: Consent by patient was obtained for treatment procedures.   Debridement of mycotic and hypertrophic toenails, 1 through 5 bilateral and clearing of subungual debris. No ulceration, no infection noted.  Return Visit-Office Procedure: Patient instructed to return to the office for a follow up visit 3 months for continued evaluation and treatment.    Gardiner Barefoot DPM

## 2019-04-11 ENCOUNTER — Other Ambulatory Visit: Payer: Self-pay | Admitting: Family Medicine

## 2019-04-11 DIAGNOSIS — E039 Hypothyroidism, unspecified: Secondary | ICD-10-CM

## 2019-04-12 ENCOUNTER — Telehealth: Payer: Self-pay | Admitting: Neurology

## 2019-04-12 NOTE — Telephone Encounter (Signed)
Wife left vm about wanting to speak with the nurse. Thanks!

## 2019-04-13 NOTE — Telephone Encounter (Signed)
mychart message sent

## 2019-04-14 NOTE — Telephone Encounter (Signed)
Patients wife stated that at his routine eye appointment his left eye was having difficulty. Letter was supposed to be sent to Dr Delice Lesch to suggest another MRI? Did you receive?

## 2019-04-15 ENCOUNTER — Telehealth: Payer: Self-pay | Admitting: Neurology

## 2019-04-15 NOTE — Telephone Encounter (Signed)
Spoke with patients wife and told her we had not received any report or recommendation.  She is going to follow up with eye doctor.

## 2019-04-15 NOTE — Telephone Encounter (Signed)
I don't have any letter/records from eye doctor, can you pls f/u with patient which doc it was and ask for records? Thanks!!

## 2019-04-15 NOTE — Telephone Encounter (Signed)
Patients wife called regarding a fax being sent yesterday and today. Thank you

## 2019-04-19 ENCOUNTER — Telehealth: Payer: Self-pay | Admitting: Neurology

## 2019-04-19 ENCOUNTER — Other Ambulatory Visit: Payer: Self-pay

## 2019-04-19 DIAGNOSIS — H53462 Homonymous bilateral field defects, left side: Secondary | ICD-10-CM

## 2019-04-19 NOTE — Telephone Encounter (Signed)
Notes from Dr. Wallace Going (ophthalmology) were reviewed, visual fields to confrontation appear to show a decrease on the left side. 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 It was recommended he obtain an MRI brain for possibility of stroke. MRI brain without contrast will be ordered.

## 2019-04-19 NOTE — Telephone Encounter (Signed)
The fax was in regards to Ophthalmology.  Dr. Delice Lesch has reviewed their notes. Orders have been placed for MRI of brain as Dr. Delice Lesch instructed.

## 2019-05-12 ENCOUNTER — Telehealth: Payer: Self-pay

## 2019-05-12 ENCOUNTER — Other Ambulatory Visit: Payer: Self-pay

## 2019-05-12 ENCOUNTER — Ambulatory Visit
Admission: RE | Admit: 2019-05-12 | Discharge: 2019-05-12 | Disposition: A | Discharge status: Home / Self Care | Payer: Medicare Other | Source: Ambulatory Visit | Attending: Neurology | Admitting: Neurology

## 2019-05-12 DIAGNOSIS — H53462 Homonymous bilateral field defects, left side: Secondary | ICD-10-CM | POA: Diagnosis not present

## 2019-05-12 NOTE — Telephone Encounter (Signed)
-----   Message from Cameron Sprang, MD sent at 05/12/2019 11:04 AM EST ----- Pls let patient/wife know the MRI brain did not show any evidence of stroke. No significant change from last MRI in 2019. Thanks

## 2019-05-12 NOTE — Telephone Encounter (Signed)
Wife informed of MRI results. No concerns at this time.

## 2019-05-19 ENCOUNTER — Encounter: Payer: Self-pay | Admitting: Neurology

## 2019-05-23 ENCOUNTER — Other Ambulatory Visit: Payer: Self-pay

## 2019-05-23 ENCOUNTER — Other Ambulatory Visit: Payer: Medicare Other

## 2019-05-23 DIAGNOSIS — R972 Elevated prostate specific antigen [PSA]: Secondary | ICD-10-CM

## 2019-05-23 DIAGNOSIS — N183 Chronic kidney disease, stage 3 unspecified: Secondary | ICD-10-CM

## 2019-05-24 LAB — BASIC METABOLIC PANEL WITH GFR
BUN/Creatinine Ratio: 13 (calc) (ref 6–22)
BUN: 18 mg/dL (ref 7–25)
CO2: 30 mmol/L (ref 20–32)
Calcium: 9.2 mg/dL (ref 8.6–10.3)
Chloride: 101 mmol/L (ref 98–110)
Creat: 1.42 mg/dL — ABNORMAL HIGH (ref 0.70–1.18)
GFR, Est African American: 54 mL/min/{1.73_m2} — ABNORMAL LOW (ref >=60)
GFR, Est Non African American: 47 mL/min/{1.73_m2} — ABNORMAL LOW (ref >=60)
Glucose, Bld: 84 mg/dL (ref 65–99)
Potassium: 4.2 mmol/L (ref 3.5–5.3)
Sodium: 138 mmol/L (ref 135–146)

## 2019-05-24 LAB — PSA: PSA: 3.9 ng/mL (ref <=4.0)

## 2019-05-30 ENCOUNTER — Ambulatory Visit: Payer: Medicare Other | Admitting: Family Medicine

## 2019-06-01 ENCOUNTER — Telehealth: Payer: Self-pay | Admitting: Family Medicine

## 2019-06-01 NOTE — Telephone Encounter (Signed)
I called the pt to change 06/21/19 AWV to virtual visit, but there was no answer and no voicemail.

## 2019-06-13 ENCOUNTER — Ambulatory Visit: Payer: Medicare Other | Admitting: Podiatry

## 2019-06-16 ENCOUNTER — Ambulatory Visit: Payer: Medicare Other | Admitting: Podiatry

## 2019-06-16 ENCOUNTER — Encounter: Payer: Self-pay | Admitting: Podiatry

## 2019-06-16 ENCOUNTER — Other Ambulatory Visit: Payer: Self-pay

## 2019-06-16 DIAGNOSIS — B351 Tinea unguium: Secondary | ICD-10-CM

## 2019-06-16 DIAGNOSIS — M79674 Pain in right toe(s): Secondary | ICD-10-CM | POA: Diagnosis not present

## 2019-06-16 DIAGNOSIS — M79675 Pain in left toe(s): Secondary | ICD-10-CM

## 2019-06-16 NOTE — Progress Notes (Signed)
Complaint:  Visit Type: Patient presents  to my office for preventative foot care services. Complaint: Patient states" my nails have grown long and thick and become painful to walk and wear shoes"  Patient presents to the office with his wife. The patient presents for preventative foot care services. No changes to ROS  Podiatric Exam: Vascular: dorsalis pedis and posterior tibial pulses are weakly  palpable bilateral. Capillary return is immediate. Temperature gradient is WNL. Skin turgor WNL  Sensorium: Normal Semmes Weinstein monofilament test. Normal tactile sensation bilaterally. Nail Exam: Pt has thick disfigured discolored nails with subungual debris noted bilateral entire nail hallux through fifth toenails Ulcer Exam: There is no evidence of ulcer or pre-ulcerative changes or infection. Orthopedic Exam: Muscle tone and strength are WNL. No limitations in general ROM. No crepitus or effusions noted. Foot type and digits show no abnormalities. HAV  B/L. Skin: No Porokeratosis. No infection or ulcers  Diagnosis:  Onychomycosis, , Pain in right toe, pain in left toes  Treatment & Plan Procedures and Treatment: Consent by patient was obtained for treatment procedures.   Debridement of mycotic and hypertrophic toenails, 1 through 5 bilateral and clearing of subungual debris. No ulceration, no infection noted.  Return Visit-Office Procedure: Patient instructed to return to the office for a follow up visit 3 months for continued evaluation and treatment.    Gardiner Barefoot DPM

## 2019-06-17 ENCOUNTER — Telehealth: Payer: Self-pay | Admitting: Family Medicine

## 2019-06-17 NOTE — Telephone Encounter (Signed)
I left a message asking the pt to come in on 06/21/19 at 9:00 instead of 8:30.  I explained that Jonelle Sidle will do AWV virtually while he's waiting to see Dr. Raliegh Ip.

## 2019-06-21 ENCOUNTER — Ambulatory Visit (INDEPENDENT_AMBULATORY_CARE_PROVIDER_SITE_OTHER): Payer: Medicare Other | Admitting: Family Medicine

## 2019-06-21 ENCOUNTER — Ambulatory Visit (INDEPENDENT_AMBULATORY_CARE_PROVIDER_SITE_OTHER): Payer: Medicare Other

## 2019-06-21 ENCOUNTER — Encounter: Payer: Self-pay | Admitting: Family Medicine

## 2019-06-21 ENCOUNTER — Other Ambulatory Visit: Payer: Self-pay

## 2019-06-21 VITALS — BP 135/59 | HR 63 | Temp 97.3°F | Resp 16 | Ht 70.0 in | Wt 143.0 lb

## 2019-06-21 VITALS — BP 135/59 | HR 63 | Temp 97.3°F | Ht 70.0 in | Wt 143.0 lb

## 2019-06-21 DIAGNOSIS — R1312 Dysphagia, oropharyngeal phase: Secondary | ICD-10-CM | POA: Diagnosis not present

## 2019-06-21 DIAGNOSIS — G3183 Dementia with Lewy bodies: Secondary | ICD-10-CM

## 2019-06-21 DIAGNOSIS — Z0000 Encounter for general adult medical examination without abnormal findings: Secondary | ICD-10-CM

## 2019-06-21 DIAGNOSIS — E440 Moderate protein-calorie malnutrition: Secondary | ICD-10-CM

## 2019-06-21 DIAGNOSIS — Z8673 Personal history of transient ischemic attack (TIA), and cerebral infarction without residual deficits: Secondary | ICD-10-CM

## 2019-06-21 DIAGNOSIS — H53462 Homonymous bilateral field defects, left side: Secondary | ICD-10-CM

## 2019-06-21 DIAGNOSIS — R2689 Other abnormalities of gait and mobility: Secondary | ICD-10-CM

## 2019-06-21 DIAGNOSIS — F0280 Dementia in other diseases classified elsewhere without behavioral disturbance: Secondary | ICD-10-CM

## 2019-06-21 NOTE — Assessment & Plan Note (Signed)
Secondary to poor nutrition appetite and dysphagia issue now No other GI red flag symptoms that would explain his malnutrition weight loss, seems to not be taking in as much now.  Refer to SLP for swallow evaluation

## 2019-06-21 NOTE — Assessment & Plan Note (Signed)
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

## 2019-06-21 NOTE — Patient Instructions (Signed)
Blake Bates , Thank you for taking time to come for your Medicare Wellness Visit. I appreciate your ongoing commitment to your health goals. Please review the following plan we discussed and let me know if I can assist you in the future.   Screening recommendations/referrals: Colonoscopy: no longer required Recommended yearly ophthalmology/optometry visit for glaucoma screening and checkup Recommended yearly dental visit for hygiene and checkup  Vaccinations: Influenza vaccine: up to date Pneumococcal vaccine: up to date  Tdap vaccine: up to date  Shingles vaccine: shingrix eligible    Coviid-19: completed first dose, second dose scheduled   Advanced directives: Please bring a copy of your health care power of attorney and living will to the office at your convenience.  Conditions/risks identified: fall risk, preventions discussed below   Next appointment: Follow up in one year for your annual wellness visit   Preventive Care 65 Years and Older, Male Preventive care refers to lifestyle choices and visits with your health care provider that can promote health and wellness. What does preventive care include?  A yearly physical exam. This is also called an annual well check.  Dental exams once or twice a year.  Routine eye exams. Ask your health care provider how often you should have your eyes checked.  Personal lifestyle choices, including:  Daily care of your teeth and gums.  Regular physical activity.  Eating a healthy diet.  Avoiding tobacco and drug use.  Limiting alcohol use.  Practicing safe sex.  Taking low doses of aspirin every day.  Taking vitamin and mineral supplements as recommended by your health care provider. What happens during an annual well check? The services and screenings done by your health care provider during your annual well check will depend on your age, overall health, lifestyle risk factors, and family history of disease. Counseling    Your health care provider may ask you questions about your:  Alcohol use.  Tobacco use.  Drug use.  Emotional well-being.  Home and relationship well-being.  Sexual activity.  Eating habits.  History of falls.  Memory and ability to understand (cognition).  Work and work Statistician. Screening  You may have the following tests or measurements:  Height, weight, and BMI.  Blood pressure.  Lipid and cholesterol levels. These may be checked every 5 years, or more frequently if you are over 84 years old.  Skin check.  Lung cancer screening. You may have this screening every year starting at age 72 if you have a 30-pack-year history of smoking and currently smoke or have quit within the past 15 years.  Fecal occult blood test (FOBT) of the stool. You may have this test every year starting at age 102.  Flexible sigmoidoscopy or colonoscopy. You may have a sigmoidoscopy every 5 years or a colonoscopy every 10 years starting at age 58.  Prostate cancer screening. Recommendations will vary depending on your family history and other risks.  Hepatitis C blood test.  Hepatitis B blood test.  Sexually transmitted disease (STD) testing.  Diabetes screening. This is done by checking your blood sugar (glucose) after you have not eaten for a while (fasting). You may have this done every 1-3 years.  Abdominal aortic aneurysm (AAA) screening. You may need this if you are a current or former smoker.  Osteoporosis. You may be screened starting at age 34 if you are at high risk. Talk with your health care provider about your test results, treatment options, and if necessary, the need for more tests. Vaccines  Your health care provider may recommend certain vaccines, such as:  Influenza vaccine. This is recommended every year.  Tetanus, diphtheria, and acellular pertussis (Tdap, Td) vaccine. You may need a Td booster every 10 years.  Zoster vaccine. You may need this after age  74.  Pneumococcal 13-valent conjugate (PCV13) vaccine. One dose is recommended after age 19.  Pneumococcal polysaccharide (PPSV23) vaccine. One dose is recommended after age 2. Talk to your health care provider about which screenings and vaccines you need and how often you need them. This information is not intended to replace advice given to you by your health care provider. Make sure you discuss any questions you have with your health care provider. Document Released: 05/25/2015 Document Revised: 01/16/2016 Document Reviewed: 02/27/2015 Elsevier Interactive Patient Education  2017 [employer] [place] Prevention in the Home Falls can cause injuries. They can happen to people of all ages. There are many things you can do to make your home safe and to help prevent falls. What can I do on the outside of my home?  Regularly fix the edges of walkways and driveways and fix any cracks.  Remove anything that might make you trip as you walk through a door, such as a raised step or threshold.  Trim any bushes or trees on the path to your home.  Use bright outdoor lighting.  Clear any walking paths of anything that might make someone trip, such as rocks or tools.  Regularly check to see if handrails are loose or broken. Make sure that both sides of any steps have handrails.  Any raised decks and porches should have guardrails on the edges.  Have any leaves, snow, or ice cleared regularly.  Use sand or salt on walking paths during winter.  Clean up any spills in your garage right away. This includes oil or grease spills. What can I do in the bathroom?  Use night lights.  Install grab bars by the toilet and in the tub and shower. Do not use towel bars as grab bars.  Use non-skid mats or decals in the tub or shower.  If you need to sit down in the shower, use a plastic, non-slip stool.  Keep the floor dry. Clean up any water that spills on the floor as soon as it happens.  Remove  soap buildup in the tub or shower regularly.  Attach bath mats securely with double-sided non-slip rug tape.  Do not have throw rugs and other things on the floor that can make you trip. What can I do in the bedroom?  Use night lights.  Make sure that you have a light by your bed that is easy to reach.  Do not use any sheets or blankets that are too big for your bed. They should not hang down onto the floor.  Have a firm chair that has side arms. You can use this for support while you get dressed.  Do not have throw rugs and other things on the floor that can make you trip. What can I do in the kitchen?  Clean up any spills right away.  Avoid walking on wet floors.  Keep items that you use a lot in easy-to-reach places.  If you need to reach something above you, use a strong step stool that has a grab bar.  Keep electrical cords out of the way.  Do not use floor polish or wax that makes floors slippery. If you must use wax, use non-skid floor wax.  Do  not have throw rugs and other things on the floor that can make you trip. What can I do with my stairs?  Do not leave any items on the stairs.  Make sure that there are handrails on both sides of the stairs and use them. Fix handrails that are broken or loose. Make sure that handrails are as long as the stairways.  Check any carpeting to make sure that it is firmly attached to the stairs. Fix any carpet that is loose or worn.  Avoid having throw rugs at the top or bottom of the stairs. If you do have throw rugs, attach them to the floor with carpet tape.  Make sure that you have a light switch at the top of the stairs and the bottom of the stairs. If you do not have them, ask someone to add them for you. What else can I do to help prevent falls?  Wear shoes that:  Do not have high heels.  Have rubber bottoms.  Are comfortable and fit you well.  Are closed at the toe. Do not wear sandals.  If you use a  stepladder:  Make sure that it is fully opened. Do not climb a closed stepladder.  Make sure that both sides of the stepladder are locked into place.  Ask someone to hold it for you, if possible.  Clearly mark and make sure that you can see:  Any grab bars or handrails.  First and last steps.  Where the edge of each step is.  Use tools that help you move around (mobility aids) if they are needed. These include:  Canes.  Walkers.  Scooters.  Crutches.  Turn on the lights when you go into a dark area. Replace any light bulbs as soon as they burn out.  Set up your furniture so you have a clear path. Avoid moving your furniture around.  If any of your floors are uneven, fix them.  If there are any pets around you, be aware of where they are.  Review your medicines with your doctor. Some medicines can make you feel dizzy. This can increase your chance of falling. Ask your doctor what other things that you can do to help prevent falls. This information is not intended to replace advice given to you by your health care provider. Make sure you discuss any questions you have with your health care provider. Document Released: 02/22/2009 Document Revised: 10/04/2015 Document Reviewed: 06/02/2014 Elsevier Interactive Patient Education  2017 [employer].

## 2019-06-21 NOTE — Patient Instructions (Addendum)
Thank you for coming to the office today.  For tongue, do not see any  Obvious tongue sore or injury. If needed can use OTC Peroxyl mouthwash to help heal.  I will work on a referral to local SLP Speech Language Pathologist (Swallowing evaluation) - they can determine source of difficulty of swallowing and help re-train.  Will contact Dr Delice Lesch later today and ask her about setting up or locating a therapist for balance rehab locally in [place] rather than [place].  Recommend Ensure or Boost - any preferred flavor as a between meal supplement or meal substitute - goal to get nutrition to avoid further weight loss.  Please schedule a Follow-up Appointment to: Return in about 3 months (around 09/18/2019) for 3 month follow-up dysphagia, balance issues.  If you have any other questions or concerns, please feel free to call the office or send a message through [place]. You may also schedule an earlier appointment if necessary.  Additionally, you may be receiving a survey about your experience at our office within a few days to 1 week by e-mail or mail. We value your feedback.  [name], DO [place]

## 2019-06-21 NOTE — Progress Notes (Signed)
Subjective:   Blake Bates is a 79 y.o. male who presents for Medicare Annual/Subsequent preventive examination.  This visit is being conducted via phone call  - after an attmept to do on video chat - due to the COVID-19 pandemic. This patient has given me verbal consent via phone to conduct this visit, patient states they are participating from their home address. Some vital signs may be absent or patient reported.   Patient identification: identified by name, DOB, and current address.    Review of Systems:   Cardiac Risk Factors include: advanced age (>90men, >29 women);male gender;dyslipidemia;hypertension     Objective:    Vitals: There were no vitals taken for this visit.  There is no height or weight on file to calculate BMI.  Advanced Directives 06/21/2019 06/15/2018 11/24/2017 06/09/2017 06/20/2016 05/29/2015  Does Patient Have a Medical Advance Directive? Yes Yes Yes Yes Yes No  Type of Advance Directive Living will;Healthcare Power of [place];Living will Living will [place];Living will - -  Does patient want to make changes to medical advance directive? - - - - - Yes - information given  Copy of [place] in Chart? No - copy requested No - copy requested - No - copy requested - -  Would patient like information on creating a medical advance directive? - - - - - Yes - Educational materials given    Tobacco Social History   Tobacco Use  Smoking Status Former Smoker  . Packs/day: 0.00  . Years: 60.00  . Pack years: 0.00  . Types: Pipe  . Quit date: 05/12/2016  . Years since quitting: 3.1  Smokeless Tobacco Former Systems developer  Tobacco Comment   Self taper down from full pipe daily down to half to quarter pipe     Counseling given: Not Answered Comment: Self taper down from full pipe daily down to half to quarter pipe   Clinical Intake:  Pre-visit preparation completed: Yes  Pain : No/denies pain      Nutritional Risks: None Diabetes: No  How often do you need to have someone help you when you read instructions, pamphlets, or other written materials from your doctor or pharmacy?: 1 - Never  Interpreter Needed?: No  Information entered by :: Fany Cavanaugh,LPN  Past Medical History:  Diagnosis Date  . Asthma   . Diverticulosis   . Full dentures   . Hyperlipidemia   . Hypothyroidism    s/p subtotal thyroidectomy  . Muscle spasms of lower extremity    Right upper leg  . Osteoporosis   . Rhinitis, allergic    pollens, mold, animal dander   Past Surgical History:  Procedure Laterality Date  . CATARACT EXTRACTION W/PHACO Left 09/20/2014   Procedure: CATARACT EXTRACTION PHACO AND INTRAOCULAR LENS PLACEMENT (IOC);  Surgeon: Leandrew Koyanagi, MD;  [facility];  Service: Ophthalmology;  Laterality: Left;  . COLONOSCOPY    . PARATHYROIDECTOMY    . THYROID SURGERY     subtotal   Family History  Problem Relation Age of Onset  . Stroke Mother 67  . Lung cancer Father 47  . Prostate cancer Paternal Grandfather 17  . Stroke Paternal Aunt    Social History   Socioeconomic History  . Marital status: Married    Spouse name: Not on file  . Number of children: Not on file  . Years of education: Not on file  . Highest education level: Doctorate  Occupational History  .  Occupation: retired  Tobacco Use  . Smoking status: Former Smoker    Packs/day: 0.00    Years: 60.00    Pack years: 0.00    Types: Pipe    Quit date: 05/12/2016    Years since quitting: 3.1  . Smokeless tobacco: Former Systems developer  . Tobacco comment: Self taper down from full pipe daily down to half to quarter pipe  Substance and Sexual Activity  . Alcohol use: Not Currently    Comment: occasionally beer or wine   . Drug use: No  . Sexual activity: Not on file  Other Topics Concern  . Not on file  [place] lives in 1 story home with his wife    Right handed   Has 2 surviving children   PhD in Biology   Retired professor   Social Determinants of Radio broadcast assistant Strain:   . Difficulty of Paying Living Expenses: Not on file  Food Insecurity:   . Worried About Charity fundraiser in the Last Year: Not on file  . Ran Out of Food in the Last Year: Not on file  Transportation Needs:   . Lack of Transportation (Medical): Not on file  . Lack of Transportation (Non-Medical): Not on file  Physical Activity:   . Days of Exercise per Week: Not on file  . Minutes of Exercise per Session: Not on file  Stress:   . Feeling of Stress : Not on file  Social Connections:   . Frequency of Communication with Friends and Family: Not on file  . Frequency of Social Gatherings with Friends and Family: Not on file  . Attends Religious Services: Not on file  . Active Member of Clubs or Organizations: Not on file  . Attends Archivist Meetings: Not on file  . Marital Status: Not on file    Outpatient Encounter Medications as of 06/21/2019  Medication Sig  . Acetaminophen (TYLENOL ARTHRITIS PAIN PO) Take by mouth. Once a day  . AFLURIA QUADRIVALENT 0.5 ML injection   . albuterol (PROVENTIL HFA;VENTOLIN HFA) 108 (90 BASE) MCG/ACT inhaler Inhale into the lungs every 6 (six) hours as needed for wheezing or shortness of breath.  [name] aspirin EC 81 MG tablet Take 1 tablet (81 mg total) by mouth daily.  . Cholecalciferol (VITAMIN D-3 PO) Take 5,000 Units/oz/day by mouth daily. PM   . Cyanocobalamin (VITAMIN B-12) 5000 MCG SUBL Place under the tongue daily.  [name] donepezil (ARICEPT) 10 MG tablet Take 1 tablet daily  . levothyroxine (SYNTHROID) 50 MCG tablet TAKE 1 TABLET(50 MCG) BY MOUTH DAILY BEFORE BREAKFAST  . loratadine (CLARITIN) 10 MG tablet Take 10 mg by mouth daily as needed for allergies. PM  . meloxicam (MOBIC) 15 MG tablet TAKE 1 TABLET BY MOUTH DAILY AS NEEDED FOR PAIN. FOR ARTHRITIS FARE, TAKE UP TO 1 TO 2 WEEK AT A TIME  .  UNABLE TO FIND   . [DISCONTINUED] memantine (NAMENDA) 10 MG tablet Take 1 tablet every night for 2 weeks, then increase to 1 tablet twice a day   No facility-administered encounter medications on file as of 06/21/2019.    Activities of Daily Living In your present state of health, do you have any difficulty performing the following activities: 06/21/2019  Hearing? Y  Comment hearing aids, doenst use them  Vision? N  Comment glasses,[facility]  Difficulty concentrating or making decisions? Y  Comment  mild dementia, Dr. Delice Lesch  Walking or climbing stairs? Y  Dressing or bathing? Y  Comment wife assists  Doing errands, shopping? Y  Comment wife assists  Conservation officer, nature and eating ? N  Using the Toilet? N  In the past six months, have you accidently leaked urine? N  Do you have problems with loss of bowel control? N  Managing your Medications? Y  Comment wife does  Managing your Finances? Y  Comment wife does  Runner, broadcasting/film/video? Y  Comment wife does  Some recent data might be hidden    Patient Care Team: [name], DO as PCP - General (Family Medicine) Cameron Sprang, MD as Consulting Physician (Neurology)   Assessment:   This is a routine wellness examination for [place].  Exercise Activities and Dietary recommendations Current Exercise Habits: The patient does not participate in regular exercise at present, Exercise limited by: None identified  Goals    . DIET - INCREASE WATER INTAKE     Recommend drinking at least 6-8 glasses of water a day    . Exercise 3x per week (30 min per time)     Trouble finding place to walk.     . Quit smoking / using tobacco     Wants to reduce tobacco for future.       Fall Risk: Fall Risk  06/21/2019 06/21/2019 11/24/2018 06/25/2018 06/15/2018  Falls in the past year? 0 0 0 0 0  Number falls in past yr: 0 0 0 0 0  Injury with Fall? 0 0 0 0 -  Risk for fall due to : - - - - -  Follow up Falls  evaluation completed - - - -    FALL RISK PREVENTION PERTAINING TO THE HOME:  Any stairs in or around the home? Yes  If so, are there any without handrails? Yes   Home free of loose throw rugs in walkways, pet beds, electrical cords, etc? Yes  Adequate lighting in your home to reduce risk of falls? Yes   ASSISTIVE DEVICES UTILIZED TO PREVENT FALLS:  Life alert? No  Use of a cane, walker or w/c? Yes  cane as needed  Grab bars in the bathroom? No  Shower chair or bench in shower? Yes  Elevated toilet seat or a handicapped toilet? Yes   TIMED UP AND GO:  Unable to perform   Depression Screen PHQ 2/9 Scores 06/21/2019 06/21/2019 11/24/2018 06/15/2018  PHQ - 2 Score 0 0 0 0  PHQ- 9 Score - - 0 -    Cognitive Function MMSE - Mini Mental State Exam 07/02/2018 07/20/2017 06/20/2016 05/29/2015  Orientation to time 5 4 5 5   Orientation to Place 3 5 5 5   Registration 3 3 3 3   Attention/ Calculation 5 2 5 5   Recall 2 0 3 3  Language- name 2 objects 2 2 2 2   Language- repeat 1 1 1 1   Language- follow 3 step command 2 3 3 3   Language- read & follow direction 1 1 1 1   Write a sentence 1 1 1 1   Copy design 1 1 1 1   Total score 26 23 30 30    Montreal Cognitive Assessment  02/07/2019 06/25/2018 10/09/2017  Visuospatial/ Executive (0/5) 2 3 3   Naming (0/3) 3 3 3   Attention: Read list of digits (0/2) 2 2 2   Attention: Read list of letters (0/1) 1 1 1   Attention: Serial 7 subtraction starting at 100 (0/3) 1 3  3  Language: Repeat phrase (0/2) 1 2 2   Language : Fluency (0/1) 0 1 0  Abstraction (0/2) 2 2 2   Delayed Recall (0/5) 0 0 1  Orientation (0/6) 6 6 3   Total 18 23 20    6CIT Screen 06/09/2017  What Year? 0 points  What month? 0 points  What time? 0 points  Count back from 20 0 points  Months in reverse 0 points  Repeat phrase 2 points  Total Score 2    Immunization History  Administered Date(s) Administered  . Influenza, High Dose Seasonal PF 05/29/2015, 03/20/2016, 02/11/2017,  03/13/2018  . Influenza,inj,Quad PF,6+ Mos 02/15/2019, 03/31/2019  . Influenza-Unspecified 02/20/2018  . PFIZER SARS-COV-2 Vaccination 05/31/2019  . Pneumococcal Conjugate-13 05/30/2014  . Pneumococcal Polysaccharide-23 04/30/2012  . Tdap 10/12/2013  . Zoster 04/11/2012    Qualifies for Shingles Vaccine? Yes  Zostavax completed n/a. Due for Shingrix. Education has been provided regarding the importance of this vaccine. Pt has been advised to call insurance company to determine out of pocket expense. Advised may also receive vaccine at local pharmacy or Health Dept. Verbalized acceptance and understanding.  Tdap: up to date   Flu Vaccine: up to date   Pneumococcal Vaccine: up to date .   Screening Tests Health Maintenance  Topic Date Due  . TETANUS/TDAP  10/13/2023  . INFLUENZA VACCINE  Completed  . PNA vac Low Risk Adult  Completed   Cancer Screenings:  Colorectal Screening: no longer required   Lung Cancer Screening: (Low Dose CT Chest recommended if Age 4-80 years, 30 pack-year currently smoking OR have quit w/in 15years.) does not qualify.    Additional Screening:  Hepatitis C Screening: does not qualify  Vision Screening: Recommended annual ophthalmology exams for early detection of glaucoma and other disorders of the eye. Is the patient up to date with their annual eye exam?  Yes  Who is the provider or what is the name of the office in which the pt attends annual eye exams? [facility]    Dental Screening: Recommended annual dental exams for proper oral hygiene  Community Resource Referral:  CRR required this visit?  No        Plan:  I have personally reviewed and addressed the Medicare Annual Wellness questionnaire and have noted the following in the patient's chart:  A. Medical and social history B. Use of alcohol, tobacco or illicit drugs  C. Current medications and supplements D. Functional ability and status E.  Nutritional status F.    Physical activity G. Advance directives H. List of other physicians I.  Hospitalizations, surgeries, and ER visits in previous 12 months J.  [place] such as hearing and vision if needed, cognitive and depression L. Referrals and appointments   In addition, I have reviewed and discussed with patient certain preventive protocols, quality metrics, and best practice recommendations. A written personalized care plan for preventive services as well as general preventive health recommendations were provided to patient.   Signed,   Bevelyn Ngo, LPN  [phone] Nurse Health Advisor   Nurse Notes: Patients wife states patient is getting off balance and is hesitating frequently when switching to different flooring types. She denies any falls in the last year. She does state the last time he went to the eye dr, he was concerned with left eye showing signs of possible stroke, but per wife MRI didn't show any evidence of stroke. Patient seeing Dr. Parks Ranger today and will discuss.

## 2019-06-21 NOTE — Progress Notes (Signed)
Subjective:    Patient ID: Blake Bates, male    DOB: 29-Jan-1941, 79 y.o.   MRN: [id number]  Blake Bates is a 79 y.o. male presenting on 06/21/2019 for Hypothyroidism  Already had virtual visit with Tyler Aas LPN today for AMW.  History provided by patient and also wife, Lindia.  HPI   Balance Disorder / Imbalance / Left homonymous hemianopia Lewy Body Dementia Reports concern with hesitancy and off balance with taking a step onto transition of floor to carpet, also has identified some imbalance in general. He does not use a cane or walker. He usually walks with touching furniture and walls. - Followed by Dr Wallace Going [facility] - he had normal vision, normal false lenses s/p cataracts, normal fundoscopic exam, but identified Left sided homonymous hemianopia visual field deficit. Had MRI was negative for this problem per Dr Delice Lesch. He was asked about a balance treatment they believe but he was unable to go to [place] due to transportation / distance. - He is able to ambulate withotu  - See prior detailed HPI prior notes for Lewy Body Dementia likely contributing. Followed by Dr [name] Neurology. Known prior history of CVA affecting as well. Next apt is in 09/07/2019. 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 Seems to result directly from difficulties with swallowing and eating. He  has new dentures, occasionally sore, he says everything he is eating foods seem spongy and not eating a lot. Will eat snacks often. But not eating regular meals - which is the thought for his weight loss. He also describes  tongue issue with feeling "cut" or problem with tongue affecting his swallowing mechanic. Overall seems worse with tougher foods and meats. 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 Continue to take Meloxicam 15mg  daily for 1 week then hold every other week with improvement Continues on regular dosing Tylenol    Depression screen [facility] 2/9 06/21/2019 06/21/2019 11/24/2018  Decreased Interest 0 0 0  Down, Depressed, Hopeless 0 0 0  PHQ - 2 Score 0 0 0  Altered sleeping - - 0  Tired, decreased energy - - 0  Change in appetite - - 0  Feeling bad or failure about yourself  - - 0  Trouble concentrating - - 0  Moving slowly or fidgety/restless - - 0  Suicidal thoughts - - 0  PHQ-9 Score - - 0  Difficult doing work/chores - - Not difficult at all    Social History   Tobacco Use  . Smoking status: Former Smoker    Packs/day: 0.00    Years: 60.00    Pack years: 0.00    Types: Pipe    Quit date: 05/12/2016    Years since quitting: 3.1  . Smokeless tobacco: Former Systems developer  . Tobacco comment: Self taper down from full pipe daily down to half to quarter pipe  Substance Use Topics  . Alcohol use: Not Currently    Comment: occasionally beer or wine   . Drug use: No    Review of Systems Per  HPI unless specifically indicated above     Objective:    BP (!) 135/59   Pulse 63   Temp (!) 97.3 F (36.3 C) (Temporal)   Resp 16   Ht 5\' 10"  (1.778 m)   Wt 143 lb (64.9 kg)   SpO2 100%   BMI 20.52 kg/m   Wt Readings from Last 3 Encounters:  06/21/19 143 lb (64.9 kg)  06/21/19 143 lb (64.9 kg)  02/07/19 151 lb 8 oz (68.7 kg)    Physical Exam Vitals and nursing note reviewed.  Constitutional:      General: He is not in acute distress.    Appearance: He is well-developed. He is not diaphoretic.     Comments: Currently well appearing elderly 79 year old male, comfortable, cooperative, thin  appearing  HENT:     Head: Normocephalic and atraumatic.     Mouth/Throat:     Mouth: Mucous membranes are moist.     Pharynx: Oropharynx is clear. No posterior oropharyngeal erythema.     Comments: Oral exam shows no obvious lesions of tongue Eyes:     General:        Right eye: No discharge.        Left eye: No discharge.     Conjunctiva/sclera: Conjunctivae normal.     Pupils: Pupils are equal, round, and reactive to light.  Neck:     Thyroid: No thyromegaly.  Cardiovascular:     Rate and Rhythm: Normal rate and regular rhythm.     Heart sounds: Normal heart sounds. No murmur.  Pulmonary:     Effort: Pulmonary effort is normal. No respiratory distress.     Breath sounds: Normal breath sounds. No wheezing or rales.  Abdominal:     General: Bowel sounds are normal. There is no distension.     Palpations: Abdomen is soft. There is no mass.     Tenderness: There is no abdominal tenderness.  Musculoskeletal:        General: No tenderness.     Cervical back: Normal range of motion and neck supple.     Comments: Upper / Lower Extremities: - Normal muscle tone, strength bilateral upper extremities 5/5, lower extremities 5/5  Some stiffness and reduced range of motion with in bilateral lower and upper extremity  Lymphadenopathy:     Cervical: No cervical adenopathy.  Skin:    General: Skin is warm and dry.     Findings: No erythema or rash.  Neurological:     Mental Status: He is alert and oriented to person, place, and time.     Comments: Distal sensation intact to light touch all extremities  Psychiatric:        Behavior: Behavior normal.     Comments: Well groomed, good eye contact, normal speech and thoughts. Follows conversational well and seems more able to participate in conversation.    Results for orders placed or performed in visit on 05/23/19  PSA  Result Value Ref Range   PSA 3.9 < OR = 4.0 ng/mL  BASIC METABOLIC PANEL WITH GFR  Result Value Ref Range    Glucose, Bld 84 65 - 99 mg/dL   BUN 18 7 - 25 mg/dL   Creat 1.42 (H) 0.70 - 1.18 mg/dL   GFR, Est Non African American 47 (L) > OR = 60 mL/min/1.57m2   GFR, Est African American 54 (L) > OR = 60 mL/min/1.16m2   BUN/Creatinine Ratio 13 6 - 22 (calc)   Sodium 138 135 -  146 mmol/L   Potassium 4.2 3.5 - 5.3 mmol/L   Chloride 101 98 - 110 mmol/L   CO2 30 20 - 32 mmol/L   Calcium 9.2 8.6 - 10.3 mg/dL      Assessment & Plan:   Problem List Items Addressed This Visit    Oropharyngeal dysphagia   Relevant Orders   SLP modified barium swallow   Moderate protein-calorie malnutrition ([place])    Secondary to poor nutrition appetite and dysphagia issue now No other GI red flag symptoms that would explain his malnutrition weight loss, seems to not be taking in as much now.  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 Continues on Aricept 10mg  daily  Follow up w/ Neurology Dr Delice Lesch as planned 08/2019      Relevant Orders   SLP modified barium swallow   Left homonymous hemianopsia   Balance disorder    Other Visit Diagnoses    History of CVA (cerebrovascular accident)       Relevant Orders   SLP modified barium swallow      #Balance Disorder Likely secondary to multiple causes, primarily I am concerned about his vision with visual field deficit L homonymous hemianopia affecting his balance, as his eyesight is good according to ophthalmology visit [place] - Also likely muscle weakness / poor nutrition / weight loss / Lewy Body dementia with stiffness is likely affecting him as well. He is still able to maintain ambulation even without assistance, and this  is more of a precaution as he has been observed to be more off balance. - Will reach out to Dr [name] neurology to see if she would recommend any particular balance PT program available in [place] area, since patient cannot readily be transported to and from [place] on regular basis for a PT program  #suspected oropharyngeal dysphagia / weight loss malnutrition moderate Underlying cause seems to be dysphagia affecting his eating, and appetite, worse after new dentures but overall seems to be more throat and tongue related dysphagia, question if related to any weakness from prior CVA or his current neurological state - Will benefit from SLP modified barium swallow and eval and treat, will submit orders for this next - Discussed nutrition supplement with ensure/boost for maintain protein and calories to avoid further weight loss and weakness  Orders Placed This Encounter  Procedures  . SLP modified barium swallow    History of CVA, Lewy Body Dementia, has dentures, now weight loss >6 months with poor dietary intake due to difficulty eating and suspected oropharyngeal dysphagia    Standing Status:   Future    Standing Expiration Date:   06/20/2020    Scheduling Instructions:     [facility]    Order Specific Question:   Where should this test be performed:    Answer:   Other    Comments:   [facility]    Order Specific Question:   Please indicate reason for Referral:    Answer:   Concerned about Dysphagia/Aspiration    Order Specific Question:   Patients current diet consistency:    Answer:   Regular    Order Specific Question:   Patients current liquid consistency:  Answer:   Thin (All Liquid Allowed)    Order Specific Question:   Existing signs/symptoms of possible Aspiration/Dysphagia:    Answer:   Sensation of food sticking in throat    Order Specific Question:   Other risk factors for Dysphagia:    Answer:   Unintentional weight loss    Order Specific Question:   Other risk factors for  Dysphagia:    Answer:   Malnutrition or Dehydration    No orders of the defined types were placed in this encounter.   Follow up plan: Return in about 3 months (around 09/18/2019) for 3 month follow-up dysphagia, balance issues.   [name], [facility] 06/21/2019, 9:43 AM

## 2019-06-22 ENCOUNTER — Other Ambulatory Visit: Payer: Self-pay | Admitting: Internal Medicine

## 2019-06-22 ENCOUNTER — Other Ambulatory Visit: Payer: Self-pay | Admitting: Family Medicine

## 2019-06-22 DIAGNOSIS — R1310 Dysphagia, unspecified: Secondary | ICD-10-CM

## 2019-06-27 ENCOUNTER — Other Ambulatory Visit: Payer: Self-pay

## 2019-06-27 DIAGNOSIS — R2681 Unsteadiness on feet: Secondary | ICD-10-CM

## 2019-07-01 ENCOUNTER — Telehealth: Payer: Self-pay | Admitting: Neurology

## 2019-07-01 NOTE — Telephone Encounter (Signed)
Patient's wife called regarding him having several falls last week. Please Call. Thank you

## 2019-07-01 NOTE — Telephone Encounter (Signed)
So far I don't see any openings before then, but pls put them on cancellation list. If he is weaker on one side, or significant change, would go to ER. Proceed with Balance therapy at [facility], pls give them number if they have not heard back yet. Thanks

## 2019-07-01 NOTE — Telephone Encounter (Signed)
Spoke to pt wife informed her that MR. Gish would be placed on cancellation list so if an appointment opens up we can call them. also she was given the Number to [facility] to get her husband scheduled for his balance therapy

## 2019-07-01 NOTE — Telephone Encounter (Signed)
Pt had 4 falls last week,  Has had an increase in weakness, unsure of why? Has an appointment April 28th asking if they can have a sooner appointment? With falls he did not hit his head, he has banged up his elbows, one fall he was on the floor for a while because his wife was out.

## 2019-07-04 ENCOUNTER — Ambulatory Visit: Payer: Medicare Other | Admitting: Podiatry

## 2019-07-04 ENCOUNTER — Other Ambulatory Visit: Payer: Self-pay | Admitting: Family Medicine

## 2019-07-04 DIAGNOSIS — G8929 Other chronic pain: Secondary | ICD-10-CM

## 2019-07-04 DIAGNOSIS — M25551 Pain in right hip: Secondary | ICD-10-CM

## 2019-07-04 DIAGNOSIS — M8949 Other hypertrophic osteoarthropathy, multiple sites: Secondary | ICD-10-CM

## 2019-07-04 DIAGNOSIS — M159 Polyosteoarthritis, unspecified: Secondary | ICD-10-CM

## 2019-07-06 ENCOUNTER — Encounter: Payer: Self-pay | Admitting: Physical Therapy

## 2019-07-06 ENCOUNTER — Other Ambulatory Visit: Payer: Self-pay

## 2019-07-06 ENCOUNTER — Ambulatory Visit: Payer: Medicare Other | Attending: Family Medicine | Admitting: Physical Therapy

## 2019-07-06 ENCOUNTER — Ambulatory Visit
Admission: RE | Admit: 2019-07-06 | Discharge: 2019-07-06 | Disposition: A | Discharge status: Home / Self Care | Payer: Medicare Other | Source: Ambulatory Visit | Attending: Internal Medicine | Admitting: Internal Medicine

## 2019-07-06 DIAGNOSIS — M6281 Muscle weakness (generalized): Secondary | ICD-10-CM | POA: Insufficient documentation

## 2019-07-06 DIAGNOSIS — R1312 Dysphagia, oropharyngeal phase: Secondary | ICD-10-CM

## 2019-07-06 DIAGNOSIS — R262 Difficulty in walking, not elsewhere classified: Secondary | ICD-10-CM | POA: Diagnosis not present

## 2019-07-06 DIAGNOSIS — R1310 Dysphagia, unspecified: Secondary | ICD-10-CM

## 2019-07-06 NOTE — Patient Instructions (Signed)
Access Code: [email or web address]  URL: [email or web address]  Date: 07/06/2019  Prepared by: Bettye Boeck  Exercises Seated March - 10 reps - 2 sets - 2x daily - 7x weekly Seated Long Arc Quad - 10 reps - 2 sets - 2x daily - 7x weekly Seated Hip Abduction with Resistance - 10 reps - 2 sets - 2x daily                            - 7x weekly Seated Heel Toe Raises - 10 reps - 2 sets - 2x daily - 7x weekly

## 2019-07-06 NOTE — Therapy (Signed)
[place], [place], [address] Phone: [phone]   Fax:     Modified Barium Swallow  Patient Details  Name: Blake Bates MRN: [id number] Date of Birth: 1941-04-16 No data recorded  Encounter Date: 07/06/2019  End of Session - 07/06/19 1445    Visit Number  1    Number of Visits  1    Date for SLP Re-Evaluation  07/06/19    SLP Start Time  [id number]    SLP Stop Time   1300    SLP Time Calculation (min)  45 min    Activity Tolerance  Patient tolerated treatment well       Past Medical History:  Diagnosis Date  . Asthma   . Diverticulosis   . Full dentures   . Hyperlipidemia   . Hypothyroidism    s/p subtotal thyroidectomy  . Muscle spasms of lower extremity    Right upper leg  . Osteoporosis   . Rhinitis, allergic    pollens, mold, animal dander    Past Surgical History:  Procedure Laterality Date  . CATARACT EXTRACTION W/PHACO Left 09/20/2014   Procedure: CATARACT EXTRACTION PHACO AND INTRAOCULAR LENS PLACEMENT (IOC);  Surgeon: Leandrew Koyanagi, MD;  Location: [facility];  Service: Ophthalmology;  Laterality: Left;  . COLONOSCOPY    . PARATHYROIDECTOMY    . THYROID SURGERY     subtotal    There were no vitals filed for this visit.      Subjective: Patient behavior: (alertness, ability to follow instructions, etc.): The patient is alert, verbally responsive, and follows simple directions.  Chief complaint: 79 year old man with history of CVA, Lewy Body Dementia, has dentures, unintentional weight loss, and poor dietary intake.     Objective:  Radiological Procedure: A videoflouroscopic evaluation of oral-preparatory, reflex initiation, and pharyngeal phases of the swallow was performed; as well as a screening of the upper esophageal phase.  I. POSTURE: Upright in MBS chair  II. VIEW: Lateral  III. COMPENSATORY STRATEGIES: Follow up swallow clears pharyngeal  residue  IV. BOLUSES ADMINISTERED:   Thin Liquid: 2 small, 3 rapid consecutive   Nectar-thick Liquid: 1 moderate   Honey-thick Liquid: DNT   Puree: 2 teaspoon presentation   Mechanical Soft: 1/4 graham cracker in applesauce  V. RESULTS OF EVALUATION: A. ORAL PREPARATORY PHASE: (The lips, tongue, and velum are observed for strength and coordination)       **Overall Severity Rating: Mild; disorganize and slowed posterior transfer  B. SWALLOW INITIATION/REFLEX: (The reflex is normal if "triggered" by the time the bolus reached the base of the tongue)  **Overall Severity Rating: Mild; triggers at the valleculae   C. PHARYNGEAL PHASE: (Pharyngeal function is normal if the bolus shows rapid, smooth, and continuous transit through the pharynx and there is no pharyngeal residue after the swallow)  **Overall Severity Rating: Mild; reduced tongue base retraction, hyolaryngeal excursion, epiglottic inversion, mild pharyngeal residue (overall, < 10%, one episode of less than half)  D. LARYNGEAL PENETRATION: (Material entering into the laryngeal inlet/vestibule but not aspirated) X1 transient (material enters the airway, remains above the vocal folds,and is ejected from the airway).   E. ASPIRATION: None  F. ESOPHAGEAL PHASE: (Screening of the upper esophagus) There appeared to be retained material in esophagus suggestive of lowed motility  ASSESSMENT: This 79 year old man; with Lewy Body Dementia; is presenting with mild oropharyngeal dysphagia characterized by slow/prolonged oral management, delayed pharyngeal swallow initiation, reduced tongue base retraction, hyolaryngeal excursion,  epiglottic inversion, mild pharyngeal residue, and one episode of flash laryngeal penetration.  There is no observed tracheal aspiration.  The patient is not at risk for prandial aspiration.  This study indicates that the patient's safety and efficiency of swallowing is mildly impaired.  Poor oral intake appears to  be a part of the dementia process vs. oropharyngeal dysphagia.  The patient's wife was assured that his swallowing is safe (from an aspiration point of view) and she can encourage him to swallow without fear of choking or aspiration.  Recommend referral to a dietician for recommendations regarding nutritionally dense foods that the patient will eat.    PLAN/RECOMMENDATIONS:   A. Diet: Regular- modify as needed to accommodate patient's preferences   B. Swallowing Precautions: Patient can be encouraged to eat/drink- minimal aspiration risk   C. Recommended consultation to: dietician   D. Therapy recommendations: speech therapy is not indicated at this time   E. Results and recommendations were discussed with the patient and his wife immediately following the study and the final report routed to the referring practitioner.     Patient will benefit from skilled therapeutic intervention in order to improve the following deficits and impairments:   Oropharyngeal dysphagia - Plan: DG SWALLOW FUNC OP MEDICARE SPEECH PATH, DG SWALLOW FUNC OP MEDICARE SPEECH PATH  Swallowing disorder - Plan: DG SWALLOW FUNC OP MEDICARE SPEECH PATH, DG SWALLOW FUNC OP MEDICARE SPEECH PATH        Problem List Patient Active Problem List   Diagnosis Date Noted  . Oropharyngeal dysphagia 06/21/2019  . Moderate protein-calorie malnutrition ([place]) 06/21/2019  . Balance disorder 06/21/2019  . Left homonymous hemianopsia 06/21/2019  . Pain due to onychomycosis of toenails of both feet 01/03/2019  . Lewy body dementia without behavioral disturbance ([place]) 07/21/2017  . Elevated PSA 12/09/2016  . Vitamin D deficiency 12/09/2016  . Chronic pain of right knee 11/04/2016  . Hyperlipidemia 11/04/2016  . CKD (chronic kidney disease), stage III 11/04/2016  . Osteoarthritis of multiple joints 11/03/2016  . Chronic right hip pain 11/03/2016  . 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  Hypothyroidism 11/03/2014  . Asthma 11/03/2014  . Osteoporosis 11/03/2014  . Skin lesion of chest wall 11/03/2014  . Trigger finger of both hands 11/03/2014   Leroy Sea, MS/CCC- SLP  Lou Miner 07/06/2019, 2:47 PM  [facility] [place], [place], [address] Phone: [phone]   Fax:     Name: Blake Bates MRN: [id number] Date of Birth: 02-03-1941

## 2019-07-07 NOTE — Therapy (Signed)
[name] [facility] SERVICES [address] [place], [place], [address] Phone: [phone]   Fax:  [phone]  Physical Therapy Evaluation  Patient Details  Name: Blake Bates MRN: [id number] Date of Birth: [birth year] No data recorded  Encounter Date: 07/06/2019  PT End of Session - 07/06/19 1504    Visit Number  1    Number of Visits  16    Date for PT Re-Evaluation  08/31/19    Progress Note Due on Visit  10    PT Start Time  1105    PT Stop Time  1200    PT Time Calculation (min)  55 min    Equipment Utilized During Treatment  Gait belt;Other (comment)   FWW   Behavior During Therapy  WFL for tasks assessed/performed       Past Medical History:  Diagnosis Date  . Asthma   . Diverticulosis   . Full dentures   . Hyperlipidemia   . Hypothyroidism    s/p subtotal thyroidectomy  . Muscle spasms of lower extremity    Right upper leg  . Osteoporosis   . Rhinitis, allergic    pollens, mold, animal dander    Past Surgical History:  Procedure Laterality Date  . CATARACT EXTRACTION W/PHACO Left 09/20/2014   Procedure: CATARACT EXTRACTION PHACO AND INTRAOCULAR LENS PLACEMENT (IOC);  Surgeon: Leandrew Koyanagi, MD;  Location: [facility];  Service: Ophthalmology;  Laterality: Left;  . COLONOSCOPY    . PARATHYROIDECTOMY    . THYROID SURGERY     subtotal    There were no vitals filed for this visit.   Subjective Assessment - 07/06/19 1441    Subjective  Pt is a 79 y.o. 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  Due to dementia, pt is a poor historian so wife gives most of subjective information during this evaluation.  He has been falling quite a bit lately, and has fallen five times in the past 2 weeks per his wife's report.  Pt's wife states that they have a cane, a FWW, and a rollator at home but that he has not been using any assistive device currently.  She states that he tends to fall  more at night when trying to get up to go to the bathroom, so they have started using Depends undergarments to keep him from getting up to toilet.  Otherwise, she states he can toilet independently during the daytime.  She states that he seems to be more confused lately, especially with spatial things, and sometimes has a hard time deciding whether to get into the bed at the head or foot of the bed.  She states he has started crawling into the bed instead of turning and sitting down and using the normal sit to supine transfer technique.  They have 4 steps with B rails to enter/exit their home.  Stairs have become difficulty for the patient, so he does not go outside much anymore per wife's report.  Pt states they do not have a proper elevated toilet seat at home with arm bars, but that their toilets are higher than standard height.  Pt reports no pain anywhere upon evaluation.  Chart review indicated L visual field deficit per ophthalmologist, Dr. Wallace Going.    Patient is accompained by:  Family member    Pertinent History  Pt has Lewy Body dementia and Parkinson's disease, and has difficulty walking due to weakness and balance deficits, as well as  difficulty with motor planning and decision making.  Pt is a high fall risk and has fallen 5x in the past 2 weeks.    Limitations  Standing;Walking;House hold activities    How long can you sit comfortably?  unlimited    How long can you stand comfortably?  5 minutes    How long can you walk comfortably?  2-3 minutes, must hold onto things due to weakness and imbalance    Patient Stated Goals  "to walk better, and feel stronger"    Currently in Pain?  No/denies    Multiple Pain Sites  No      Objective Measurements:  Posture:  Pt has rounded shoulders and forward trunk lean in sitting and standing. ROM:  AROM of B UE's and LE's grossly WFL , however pt has decreased AROM of B hips and shoulders by approximately 50% of normal range. Strength:  Pt has  2+-3/5 strength grossly throughout B UE's, and 2+-3+/5 in B hips, and 3+-4/5 strength grossly throughout rest of B LE's. Motor Planning:  Pt has difficulty with motor planning and needs heavy cueing (verbal and manual) to perform stand to sit transfers on mat safely and correctly. Transfers: min A x 1 for sit <-> supine transfers; CGA-minA x1 for sit to stand transfers. Gait:  Pt ambulates with decreased step length, decreased heel strike, and decreased gait speed, with forward trunk lean and needs cueing for safe use of FWW. Special Tests: 5x Sit to Stand = 37 seconds (indicative of high fall risk);  Timed Up and Go = 1 minute and 30 seconds (indicative of high fall risk) Other:  Pt has L sided visual field deficit and has difficulty with safe transfers due to this deficit. Coordintation: Finger-Nose-Finger moderately impaired; Heel to shin moderately impaired                       PT Education - 07/06/19 1503    Education Details  Pt and his wife were instructed in HEP for beginning LE strengthening at home.  Pt and wife were given MedBridge hand outs with seated alt marching, LAQs, seated B hip abd with RTB, and seated heel/toe raises.    Person(s) Educated  Patient;Spouse    Methods  Explanation;Demonstration;Tactile cues;Verbal cues;Handout    Comprehension  Returned demonstration;Verbal cues required;Tactile cues required;Need further instruction       PT Short Term Goals - 07/06/19 1506      PT SHORT TERM GOAL #1   Title  Pt and caregiver will be educated in a new HEP for overall LE and UE strengthening, and kitchen sink balance exercises to decrease fall risk.    Baseline  No current exercises per caregiver    Time  1    Period  Weeks    Status  New    Target Date  07/13/19      PT SHORT TERM GOAL #2   Title  Pt and wife will be educated in proper use of assistive devices and safety techniques at home (increased lighting, removal of area rugs etc) to decrease  fall risk    Baseline  Currently no education on home equipment and not using assistive devices.    Time  1    Period  Weeks    Status  New    Target Date  07/13/19        PT Long Term Goals - 07/06/19 1508      PT LONG TERM GOAL #  1   Title  Pt will have increased overall UE gross strength by at least 1/2-1 muscle grade throughout to decrease his fall risk and improve his ability to ambulate further distances safely.    Baseline  Pt currently at 2+ to 3+/5 grossly throughout UE's, and 3 to 4/5 grossly throughout LE's (see objective notes)    Time  8    Period  Weeks    Status  New    Target Date  08/31/19      PT LONG TERM GOAL #2   Title  Pt's caregiver will report no greater than 0-1 falls over 1 week period due to improved balance and strength, and improved saftey techniques including assistive device.    Baseline  Pt currently falling about 5 times over 2-week period    Time  8    Period  Weeks    Status  New    Target Date  08/31/19      PT LONG TERM GOAL #3   Title  Pt will have improved Timed Up and Go score to at least 1 minute or better, indicating decreased fall risk since initial PT evaluation.    Baseline  Pt performed TUG in 1 minute and 30 seconds at initial evaluation.    Time  8    Period  Weeks    Status  New    Target Date  08/31/19      PT LONG TERM GOAL #4   Title  Pt will have improved 5 Times Sit to Stand score to at least 20 seconds or better, indicating decreased fall risk since time of initial evaluation.    Baseline  Pt performed 5 Times Sit to Stand in 37 seconds at initial evaluation.    Time  6    Period  Weeks    Status  New    Target Date  08/17/19      PT LONG TERM GOAL #5   Title  Pt will ascend/ descend 4 stairs safely with use of B handrails with SB/CGA to decrease fall risk.    Baseline  Pt currently trips of the 4 steps he uses to enter/exit his home per wife's report.    Time  6    Period  Weeks    Status  New    Target Date   08/17/19               Patient will benefit from skilled therapeutic intervention in order to improve the following deficits and impairments:     Visit Diagnosis: Muscle weakness (generalized)  Difficulty walking     Problem List Patient Active Problem List   Diagnosis Date Noted  . Oropharyngeal dysphagia 06/21/2019  . Moderate protein-calorie malnutrition ([place]) 06/21/2019  . Balance disorder 06/21/2019  . Left homonymous hemianopsia 06/21/2019  . Pain due to onychomycosis of toenails of both feet 01/03/2019  . Lewy body dementia without behavioral disturbance ([place]) 07/21/2017  . Elevated PSA 12/09/2016  . Vitamin D deficiency 12/09/2016  . Chronic pain of right knee 11/04/2016  . Hyperlipidemia 11/04/2016  . CKD (chronic kidney disease), stage III 11/04/2016  . Osteoarthritis of multiple joints 11/03/2016  . Chronic right hip pain 11/03/2016  . 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 Hypothyroidism 11/03/2014  . Asthma 11/03/2014  . Osteoporosis 11/03/2014  . Skin lesion of chest wall 11/03/2014  . Trigger finger of both hands 11/03/2014    Shamaria Kavan,MPT 07/07/2019, 9:34 AM  [place]  [place] MAIN [facility] SERVICES [place], [place], [address] Phone: [phone]   Fax:  [phone]  Name: DEREZ LUANGRATH MRN: [id number] Date of Birth: 02-22-1941

## 2019-07-11 ENCOUNTER — Ambulatory Visit: Payer: Medicare Other | Attending: Neurology

## 2019-07-11 ENCOUNTER — Other Ambulatory Visit: Payer: Self-pay

## 2019-07-11 DIAGNOSIS — M6281 Muscle weakness (generalized): Secondary | ICD-10-CM | POA: Insufficient documentation

## 2019-07-11 DIAGNOSIS — R262 Difficulty in walking, not elsewhere classified: Secondary | ICD-10-CM | POA: Diagnosis not present

## 2019-07-11 NOTE — Therapy (Signed)
[place] MAIN [facility] SERVICES [address] [place], [place], [address] Phone: [phone]   Fax:  [phone]  Physical Therapy Treatment  Patient Details  Name: Blake Bates MRN: [id number] Date of Birth: Jul 10, 1940 No data recorded  Encounter Date: 07/11/2019  PT End of Session - 07/11/19 1341    Visit Number  2    Number of Visits  16    Date for PT Re-Evaluation  08/31/19    Authorization - Number of Visits  2    Progress Note Due on Visit  10    PT Start Time  1344    PT Stop Time  1429    PT Time Calculation (min)  45 min    Equipment Utilized During Treatment  Gait belt;Other (comment)   FWW   Behavior During Therapy  WFL for tasks assessed/performed       Past Medical History:  Diagnosis Date  . Asthma   . Diverticulosis   . Full dentures   . Hyperlipidemia   . Hypothyroidism    s/p subtotal thyroidectomy  . Muscle spasms of lower extremity    Right upper leg  . Osteoporosis   . Rhinitis, allergic    pollens, mold, animal dander    Past Surgical History:  Procedure Laterality Date  . CATARACT EXTRACTION W/PHACO Left 09/20/2014   Procedure: CATARACT EXTRACTION PHACO AND INTRAOCULAR LENS PLACEMENT (IOC);  Surgeon: Leandrew Koyanagi, MD;  Location: [facility];  Service: Ophthalmology;  Laterality: Left;  . COLONOSCOPY    . PARATHYROIDECTOMY    . THYROID SURGERY     subtotal    There were no vitals filed for this visit.  Subjective Assessment - 07/11/19 1626    Subjective  Patient presents with his wife, reports no falls or LOB since last session. Has done some intermittent exercises from HEP    Patient is accompained by:  Family member    Pertinent History  Pt has Lewy Body dementia and Parkinson's disease, and has difficulty walking due to weakness and balance deficits, as well as difficulty with motor planning and decision making.  Pt is a high fall risk and has fallen 5x in the past 2 weeks.    Limitations  Standing;Walking;House hold activities    How long can you sit comfortably?  unlimited    How long can you stand comfortably?  5 minutes    How long can you walk comfortably?  2-3 minutes, must hold onto things due to weakness and imbalance    Patient Stated Goals  "to walk better, and feel stronger"    Currently in Pain?  No/denies             First tx after eval  Standing balance and strength   Treatment:   TherEx; CGA for standing interventions and cueing for body mechanics/posture  High knee stepping  In // bars, 4x length, max cueing for sequencing and lifting knee 3lb ankle weights standing:  -hip abduction 10x each LE, max cueing for upright posture and sequencing with max visual cueing -hip extension 10x each LE, max cueing for sequencing, body mechanics.   Neuro Re-ed: CGA for stability and safety awareness Step over and back orange hurdle 10x each LE  Speed ladder/agility ladder: one foot each square for increased step length/single limb support 6x length of // bars  airex pad  Static stand; lean to the right 45 seconds intermittent no UE support  airex pad 6 step toe taps  BUE support; 10x each LE, very challenging with increased trunk flexion with repetition.   Review HEP Exercises Seated March - 10 reps - 2 sets - 2x daily - 7x weekly Seated Long Arc Quad - 10 reps - 2 sets - 2x daily - 7x weekly Seated Hip Abduction with Resistance - 10 reps - 2 sets - 2x daily - 7xweekly Seated Heel Toe Raises - 10 reps - 2 sets - 2x daily - 7x weekly  Patient requires max verbal and visual cueing for sequencing and task orientation.   Patient demonstrates Left hemi neglect throughout session with frequent tactile, verbal, and visual cueing required for proper body mechanics and task orientation. Significant right lean on unsteady surfaces places COM in altered location increasing risk of LOB. Patient will benefit from skilled physical therapy to increase balance,  strength, and functional mobility for decreased fall risk.                   PT Education - 07/11/19 1339    Education Details  exercise technique, HEP compliance, body mechanics    Person(s) Educated  Patient    Methods  Explanation;Demonstration;Tactile cues;Verbal cues    Comprehension  Verbalized understanding;Returned demonstration;Verbal cues required;Tactile cues required       PT Short Term Goals - 07/06/19 1506      PT SHORT TERM GOAL #1   Title  Pt and caregiver will be educated in a new HEP for overall LE and UE strengthening, and kitchen sink balance exercises to decrease fall risk.    Baseline  No current exercises per caregiver    Time  1    Period  Weeks    Status  New    Target Date  07/13/19      PT SHORT TERM GOAL #2   Title  Pt and wife will be educated in proper use of assistive devices and safety techniques at home (increased lighting, removal of area rugs etc) to decrease fall risk    Baseline  Currently no education on home equipment and not using assistive devices.    Time  1    Period  Weeks    Status  New    Target Date  07/13/19        PT Long Term Goals - 07/06/19 1508      PT LONG TERM GOAL #1   Title  Pt will have increased overall UE gross strength by at least 1/2-1 muscle grade throughout to decrease his fall risk and improve his ability to ambulate further distances safely.    Baseline  Pt currently at 2+ to 3+/5 grossly throughout UE's, and 3 to 4/5 grossly throughout LE's (see objective notes)    Time  8    Period  Weeks    Status  New    Target Date  08/31/19      PT LONG TERM GOAL #2   Title  Pt's caregiver will report no greater than 0-1 falls over 1 week period due to improved balance and strength, and improved saftey techniques including assistive device.    Baseline  Pt currently falling about 5 times over 2-week period    Time  8    Period  Weeks    Status  New    Target Date  08/31/19      PT LONG TERM GOAL #3    Title  Pt will have improved Timed Up and Go score to at least 1 minute or better, indicating  decreased fall risk since initial PT evaluation.    Baseline  Pt performed TUG in 1 minute and 30 seconds at initial evaluation.    Time  8    Period  Weeks    Status  New    Target Date  08/31/19      PT LONG TERM GOAL #4   Title  Pt will have improved 5 Times Sit to Stand score to at least 20 seconds or better, indicating decreased fall risk since time of initial evaluation.    Baseline  Pt performed 5 Times Sit to Stand in 37 seconds at initial evaluation.    Time  6    Period  Weeks    Status  New    Target Date  08/17/19      PT LONG TERM GOAL #5   Title  Pt will ascend/ descend 4 stairs safely with use of B handrails with SB/CGA to decrease fall risk.    Baseline  Pt currently trips of the 4 steps he uses to enter/exit his home per wife's report.    Time  6    Period  Weeks    Status  New    Target Date  08/17/19            Plan - 07/11/19 1432    Clinical Impression Statement  Patient demonstrates Left hemi neglect throughout session with frequent tactile, verbal, and visual cueing required for proper body mechanics and task orientation. Significant right lean on unsteady surfaces places COM in altered location increasing risk of LOB. Patient will benefit from skilled physical therapy to increase balance, strength, and functional mobility for decreased fall risk.    Personal Factors and Comorbidities  Age;Comorbidity 3+;Fitness;Past/Current Experience;Time since onset of injury/illness/exacerbation    Comorbidities  balance, left homonymous hemianopsia, CKD, HLD, Hypothyroidism, osteoprosis,    Examination-Activity Limitations  Bathing;Caring for Others;Carry;Dressing;Locomotion Level;Lift;Reach Overhead;Squat;Stand;Transfers;Stairs    Examination-Participation Restrictions  Church;Cleaning;Community Activity;Driving;Interpersonal Relationship;Laundry;Volunteer;Shop;School;Meal  Prep;Yard Work    [employer]  Evolving/Moderate complexity    Clinical Decision Making  Moderate    Rehab Potential  Fair    PT Frequency  2x / week    PT Duration  8 weeks    PT Treatment/Interventions  ADLs/Self Care Home Management;Aquatic Therapy;Biofeedback;Cryotherapy;Electrical Stimulation;Iontophoresis 4mg /ml Dexamethasone;Moist Heat;Traction;Ultrasound;Parrafin;Therapeutic exercise;Therapeutic activities;Functional mobility training;Stair training;Gait training;DME Instruction;Balance training;Neuromuscular re-education;Patient/family education;Orthotic Fit/Training;Cognitive remediation;Manual techniques;Passive range of motion;Energy conservation;Splinting;Taping;Vestibular;Visual/perceptual remediation/compensation       Patient will benefit from skilled therapeutic intervention in order to improve the following deficits and impairments:  Abnormal gait, Cardiopulmonary status limiting activity, Decreased activity tolerance, Decreased balance, Decreased knowledge of precautions, Decreased endurance, Decreased coordination, Decreased cognition, Decreased knowledge of use of DME, Decreased mobility, Decreased safety awareness, Difficulty walking, Decreased strength, Impaired flexibility, Impaired perceived functional ability, Impaired UE functional use, Impaired tone, Postural dysfunction, Improper body mechanics  Visit Diagnosis: Muscle weakness (generalized)  Difficulty walking     Problem List Patient Active Problem List   Diagnosis Date Noted  . Oropharyngeal dysphagia 06/21/2019  . Moderate protein-calorie malnutrition ([place]) 06/21/2019  . Balance disorder 06/21/2019  . Left homonymous hemianopsia 06/21/2019  . Pain due to onychomycosis of toenails of both feet 01/03/2019  . Lewy body dementia without behavioral disturbance ([place]) 07/21/2017  . Elevated PSA 12/09/2016  . Vitamin D deficiency 12/09/2016  . Chronic pain of right knee 11/04/2016  .  Hyperlipidemia 11/04/2016  . CKD (chronic kidney disease), stage III 11/04/2016  . Osteoarthritis of multiple joints 11/03/2016  . Chronic  right hip pain 11/03/2016  . 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 Hypothyroidism 11/03/2014  . Asthma 11/03/2014  . Osteoporosis 11/03/2014  . Skin lesion of chest wall 11/03/2014  . Trigger finger of both hands 11/03/2014   Janna Arch, PT, DPT   07/11/2019, 4:33 PM  [place] MAIN [facility] SERVICES [address] [place], [place], [address] Phone: [phone]   Fax:  [phone]  Name: DEMARKO LUMBARD MRN: [id number] Date of Birth: 24-Aug-1940

## 2019-07-13 ENCOUNTER — Ambulatory Visit: Payer: Medicare Other

## 2019-07-13 ENCOUNTER — Other Ambulatory Visit: Payer: Self-pay

## 2019-07-13 DIAGNOSIS — R262 Difficulty in walking, not elsewhere classified: Secondary | ICD-10-CM

## 2019-07-13 DIAGNOSIS — M6281 Muscle weakness (generalized): Secondary | ICD-10-CM

## 2019-07-13 NOTE — Therapy (Signed)
[place] MAIN [facility] SERVICES [address] [place], [place], [address] Phone: [phone]   Fax:  [phone]  Physical Therapy Treatment  Patient Details  Name: Blake Bates MRN: [id number] Date of Birth: [birth year] No data recorded  Encounter Date: 07/13/2019  PT End of Session - 07/13/19 1421    Visit Number  3    Number of Visits  16    Date for PT Re-Evaluation  08/31/19    Authorization - Number of Visits  3    Progress Note Due on Visit  10    PT Start Time  1430    PT Stop Time  1515    PT Time Calculation (min)  45 min    Equipment Utilized During Treatment  Gait belt;Other (comment)   FWW   Behavior During Therapy  WFL for tasks assessed/performed       Past Medical History:  Diagnosis Date  . Asthma   . Diverticulosis   . Full dentures   . Hyperlipidemia   . Hypothyroidism    s/p subtotal thyroidectomy  . Muscle spasms of lower extremity    Right upper leg  . Osteoporosis   . Rhinitis, allergic    pollens, mold, animal dander    Past Surgical History:  Procedure Laterality Date  . CATARACT EXTRACTION W/PHACO Left 09/20/2014   Procedure: CATARACT EXTRACTION PHACO AND INTRAOCULAR LENS PLACEMENT (IOC);  Surgeon: Leandrew Koyanagi, MD;  Location: [place];  Service: Ophthalmology;  Laterality: Left;  . COLONOSCOPY    . PARATHYROIDECTOMY    . THYROID SURGERY     subtotal    There were no vitals filed for this visit.  Subjective Assessment - 07/13/19 1420    Subjective  Patient presents with his wife, reports no falls or LOB since last session. Has done some intermittent exercises from HEP. No specific questions or concerns currently.    Patient is accompained by:  Family member    Pertinent History  Pt has Lewy Body dementia and Parkinson's disease, and has difficulty walking due to weakness and balance deficits, as well as difficulty with motor planning and decision making.  Pt is a high fall risk  and has fallen 5x in the past 2 weeks.    Limitations  Standing;Walking;House hold activities    How long can you sit comfortably?  unlimited    How long can you stand comfortably?  5 minutes    How long can you walk comfortably?  2-3 minutes, must hold onto things due to weakness and imbalance    Patient Stated Goals  "to walk better, and feel stronger"    Currently in Pain?  No/denies         TREATMENT  Therapeutic Exercise NuStep L1 x 5 minutes for warm-up during history from patient and wife (2 minutes unbilled); Precor leg press 55# 2 x 20; Seated marches with43 ankle weight (AW) x 20bilateral; Seated LAQ with3# AW x 15 bilateral; Seated clams withgreen tbandx15bilateral; Seated adductorisometric squeeze withball, 3s holdx 15bilateral; Standing mini squat with bilateral hand held support Sit to stand without UE support x 10;  Standing with 3# ankle weights: Standingbilateralheel raises with BUE handheld support x15; Standing hip abduction x 15 bilaterally;  Standing HS curls x 15, required min VCs to increase gluteal activation/hip extension for better strengthening;   Neuromuscular Re-education Forward and backward ambulation in // bars without UE support x 4 lengths each; Side stepping in // bars without UE support  x 2 lengths each; 6" orange hurdle steps with BUE support x 10 on each side in // bars, attempted to get pt to let go of rails but he is unwilling to relase; Airex WBOS no UE support x 30s; Airex WBOS ball passes around body x multiple bouts each direction, significant encouragement required for patient to hold ball with both hands; Airex WBOS overhead ball lifts with encouragement provided for patient to follow ball with head/eyes x 10;   Patient demonstrates left hemi neglect throughout session with frequent tactile, verbal, and visual cueing required for proper body mechanics and task orientation. He is able to complete all exercises  but requiring heavy cues and performance is somewhat limited. He struggles with anxiety during balance activities being resistant to releasing UE support on rails while in parallel bars. Patient and wife encouraged to continue HEP. Patient will benefit from skilled physical therapy to increase balance, strength, and functional mobility for decreased fall risk.                          PT Short Term Goals - 07/06/19 1506      PT SHORT TERM GOAL #1   Title  Pt and caregiver will be educated in a new HEP for overall LE and UE strengthening, and kitchen sink balance exercises to decrease fall risk.    Baseline  No current exercises per caregiver    Time  1    Period  Weeks    Status  New    Target Date  07/13/19      PT SHORT TERM GOAL #2   Title  Pt and wife will be educated in proper use of assistive devices and safety techniques at home (increased lighting, removal of area rugs etc) to decrease fall risk    Baseline  Currently no education on home equipment and not using assistive devices.    Time  1    Period  Weeks    Status  New    Target Date  07/13/19        PT Long Term Goals - 07/06/19 1508      PT LONG TERM GOAL #1   Title  Pt will have increased overall UE gross strength by at least 1/2-1 muscle grade throughout to decrease his fall risk and improve his ability to ambulate further distances safely.    Baseline  Pt currently at 2+ to 3+/5 grossly throughout UE's, and 3 to 4/5 grossly throughout LE's (see objective notes)    Time  8    Period  Weeks    Status  New    Target Date  08/31/19      PT LONG TERM GOAL #2   Title  Pt's caregiver will report no greater than 0-1 falls over 1 week period due to improved balance and strength, and improved saftey techniques including assistive device.    Baseline  Pt currently falling about 5 times over 2-week period    Time  8    Period  Weeks    Status  New    Target Date  08/31/19      PT LONG TERM GOAL #3    Title  Pt will have improved Timed Up and Go score to at least 1 minute or better, indicating decreased fall risk since initial PT evaluation.    Baseline  Pt performed TUG in 1 minute and 30 seconds at initial evaluation.    Time  8    Period  Weeks    Status  New    Target Date  08/31/19      PT LONG TERM GOAL #4   Title  Pt will have improved 5 Times Sit to Stand score to at least 20 seconds or better, indicating decreased fall risk since time of initial evaluation.    Baseline  Pt performed 5 Times Sit to Stand in 37 seconds at initial evaluation.    Time  6    Period  Weeks    Status  New    Target Date  08/17/19      PT LONG TERM GOAL #5   Title  Pt will ascend/ descend 4 stairs safely with use of B handrails with SB/CGA to decrease fall risk.    Baseline  Pt currently trips of the 4 steps he uses to enter/exit his home per wife's report.    Time  6    Period  Weeks    Status  New    Target Date  08/17/19            Plan - 07/13/19 1421    Clinical Impression Statement  Patient demonstrates left hemi neglect throughout session with frequent tactile, verbal, and visual cueing required for proper body mechanics and task orientation. He is able to complete all exercises but requiring heavy cues and performance is somewhat limited. He struggles with anxiety during balance activities being resistant to releasing UE support on rails while in parallel bars. Patient and wife encouraged to continue HEP. Patient will benefit from skilled physical therapy to increase balance, strength, and functional mobility for decreased fall risk.    Personal Factors and Comorbidities  Age;Comorbidity 3+;Fitness;Past/Current Experience;Time since onset of injury/illness/exacerbation    Comorbidities  balance, left homonymous hemianopsia, CKD, HLD, Hypothyroidism, osteoprosis,    Examination-Activity Limitations  Bathing;Caring for Others;Carry;Dressing;Locomotion Level;Lift;Reach  Overhead;Squat;Stand;Transfers;Stairs    Examination-Participation Restrictions  Church;Cleaning;Community Activity;Driving;Interpersonal Relationship;Laundry;Volunteer;Shop;School;Meal Prep;Yard Work    [employer] officer  Evolving/Moderate complexity    Clinical Decision Making  Moderate    Rehab Potential  Fair    PT Frequency  2x / week    PT Duration  8 weeks    PT Treatment/Interventions  ADLs/Self Care Home Management;Aquatic Therapy;Biofeedback;Cryotherapy;Electrical Stimulation;Iontophoresis 4mg /ml Dexamethasone;Moist Heat;Traction;Ultrasound;Parrafin;Therapeutic exercise;Therapeutic activities;Functional mobility training;Stair training;Gait training;DME Instruction;Balance training;Neuromuscular re-education;Patient/family education;Orthotic Fit/Training;Cognitive remediation;Manual techniques;Passive range of motion;Energy conservation;Splinting;Taping;Vestibular;Visual/perceptual remediation/compensation       Patient will benefit from skilled therapeutic intervention in order to improve the following deficits and impairments:  Abnormal gait, Cardiopulmonary status limiting activity, Decreased activity tolerance, Decreased balance, Decreased knowledge of precautions, Decreased endurance, Decreased coordination, Decreased cognition, Decreased knowledge of use of DME, Decreased mobility, Decreased safety awareness, Difficulty walking, Decreased strength, Impaired flexibility, Impaired perceived functional ability, Impaired UE functional use, Impaired tone, Postural dysfunction, Improper body mechanics  Visit Diagnosis: Muscle weakness (generalized)  Difficulty walking     Problem List Patient Active Problem List   Diagnosis Date Noted  . Oropharyngeal dysphagia 06/21/2019  . Moderate protein-calorie malnutrition ([place]) 06/21/2019  . Balance disorder 06/21/2019  . Left homonymous hemianopsia 06/21/2019  . Pain due to onychomycosis of toenails of both feet  01/03/2019  . Lewy body dementia without behavioral disturbance ([place]) 07/21/2017  . Elevated PSA 12/09/2016  . Vitamin D deficiency 12/09/2016  . Chronic pain of right knee 11/04/2016  . Hyperlipidemia 11/04/2016  . CKD (chronic kidney disease), stage III 11/04/2016  . Osteoarthritis of multiple joints 11/03/2016  . Chronic right hip  pain 11/03/2016  . 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 Hypothyroidism 11/03/2014  . Asthma 11/03/2014  . Osteoporosis 11/03/2014  . Skin lesion of chest wall 11/03/2014  . Trigger finger of both hands 11/03/2014   Phillips Grout PT, DPT, GCS  Blake Bates 07/13/2019, 3:28 PM  [place] MAIN [facility] SERVICES [address] [place], [place], [address] Phone: [phone]   Fax:  [phone]  Name: Blake Bates MRN: [id number] Date of Birth: 1941-02-01

## 2019-07-18 ENCOUNTER — Other Ambulatory Visit: Payer: Self-pay

## 2019-07-18 ENCOUNTER — Ambulatory Visit: Payer: Medicare Other

## 2019-07-18 DIAGNOSIS — M6281 Muscle weakness (generalized): Secondary | ICD-10-CM | POA: Diagnosis not present

## 2019-07-18 DIAGNOSIS — R262 Difficulty in walking, not elsewhere classified: Secondary | ICD-10-CM | POA: Diagnosis not present

## 2019-07-18 NOTE — Therapy (Signed)
[place] MAIN [facility] SERVICES [address] [place], [place], [address] Phone: [phone]   Fax:  [phone]  Physical Therapy Treatment  Patient Details  Name: Blake Bates MRN: [id number] Date of Birth: [birth year] No data recorded  Encounter Date: 07/18/2019  PT End of Session - 07/18/19 1517    Visit Number  4    Number of Visits  16    Date for PT Re-Evaluation  08/31/19    Authorization - Number of Visits  4    Progress Note Due on Visit  10    PT Start Time  1509    PT Stop Time  1556    PT Time Calculation (min)  47 min    Equipment Utilized During Treatment  Gait belt;Other (comment)   FWW   Behavior During Therapy  WFL for tasks assessed/performed       Past Medical History:  Diagnosis Date  . Asthma   . Diverticulosis   . Full dentures   . Hyperlipidemia   . Hypothyroidism    s/p subtotal thyroidectomy  . Muscle spasms of lower extremity    Right upper leg  . Osteoporosis   . Rhinitis, allergic    pollens, mold, animal dander    Past Surgical History:  Procedure Laterality Date  . CATARACT EXTRACTION W/PHACO Left 09/20/2014   Procedure: CATARACT EXTRACTION PHACO AND INTRAOCULAR LENS PLACEMENT (IOC);  Surgeon: Leandrew Koyanagi, MD;  Location: [place];  Service: Ophthalmology;  Laterality: Left;  . COLONOSCOPY    . PARATHYROIDECTOMY    . THYROID SURGERY     subtotal    There were no vitals filed for this visit.  Subjective Assessment - 07/18/19 1515    Subjective  Patient presents with wife, reports no falls or LOB since last session. Has been compliant with HEP.    Patient is accompained by:  Family member    Pertinent History  Pt has Lewy Body dementia and Parkinson's disease, and has difficulty walking due to weakness and balance deficits, as well as difficulty with motor planning and decision making.  Pt is a high fall risk and has fallen 5x in the past 2 weeks.    Limitations   Standing;Walking;House hold activities    How long can you sit comfortably?  unlimited    How long can you stand comfortably?  5 minutes    How long can you walk comfortably?  2-3 minutes, must hold onto things due to weakness and imbalance    Patient Stated Goals  "to walk better, and feel stronger"    Currently in Pain?  No/denies         TREATMENT   Therapeutic Exercise NuStep L1 x 4 minutes for cardiovascular challenge with RPM> 30  Sit to stand without UE support x 10 from standard height chair Standing RTB around ankles side step 2x length of // bars Standing RTB around ankles hip extension 10x each LE Seated RTB hamstring curl 10x each LE cues for body mechanics and sequencing Seated RTB around knees marching with upright posture 10x each LE Seated RTB rows for upright posture, very challenging for patient to perform 10x Seated RTB UE ER for posture; max cueing 10x     Neuromuscular Re-education  Ambulate in // bars with 2x4 between feet decreasing UE support to no UE support 8x length of // bars; 6" orange hurdle steps with SUE support x 10 ; holding small weighted ball in opp UE to  prevent BUE support  Airex pad static stand no UE support x 30s; Airex pad ball chest press 10x Airex pad  overhead ball lifts with encouragement provided for patient to follow ball with head/eyes x 10; bosu ball round side up: modified forward lunge 10x each LE      Patient demonstrates left hemi neglect throughout session with frequent tactile, verbal, and visual cueing required for proper body mechanics and task orientation. He is able to complete all exercises but requiring heavy cues and performance is somewhat limited.                         PT Education - 07/18/19 1516    Education Details  exercise technique, body mechanics,    Person(s) Educated  Patient    Methods  Explanation;Demonstration;Tactile cues;Verbal cues    Comprehension  Verbalized  understanding;Returned demonstration;Verbal cues required;Tactile cues required       PT Short Term Goals - 07/06/19 1506      PT SHORT TERM GOAL #1   Title  Pt and caregiver will be educated in a new HEP for overall LE and UE strengthening, and kitchen sink balance exercises to decrease fall risk.    Baseline  No current exercises per caregiver    Time  1    Period  Weeks    Status  New    Target Date  07/13/19      PT SHORT TERM GOAL #2   Title  Pt and wife will be educated in proper use of assistive devices and safety techniques at home (increased lighting, removal of area rugs etc) to decrease fall risk    Baseline  Currently no education on home equipment and not using assistive devices.    Time  1    Period  Weeks    Status  New    Target Date  07/13/19        PT Long Term Goals - 07/06/19 1508      PT LONG TERM GOAL #1   Title  Pt will have increased overall UE gross strength by at least 1/2-1 muscle grade throughout to decrease his fall risk and improve his ability to ambulate further distances safely.    Baseline  Pt currently at 2+ to 3+/5 grossly throughout UE's, and 3 to 4/5 grossly throughout LE's (see objective notes)    Time  8    Period  Weeks    Status  New    Target Date  08/31/19      PT LONG TERM GOAL #2   Title  Pt's caregiver will report no greater than 0-1 falls over 1 week period due to improved balance and strength, and improved saftey techniques including assistive device.    Baseline  Pt currently falling about 5 times over 2-week period    Time  8    Period  Weeks    Status  New    Target Date  08/31/19      PT LONG TERM GOAL #3   Title  Pt will have improved Timed Up and Go score to at least 1 minute or better, indicating decreased fall risk since initial PT evaluation.    Baseline  Pt performed TUG in 1 minute and 30 seconds at initial evaluation.    Time  8    Period  Weeks    Status  New    Target Date  08/31/19      PT LONG  TERM GOAL  #4   Title  Pt will have improved 5 Times Sit to Stand score to at least 20 seconds or better, indicating decreased fall risk since time of initial evaluation.    Baseline  Pt performed 5 Times Sit to Stand in 37 seconds at initial evaluation.    Time  6    Period  Weeks    Status  New    Target Date  08/17/19      PT LONG TERM GOAL #5   Title  Pt will ascend/ descend 4 stairs safely with use of B handrails with SB/CGA to decrease fall risk.    Baseline  Pt currently trips of the 4 steps he uses to enter/exit his home per wife's report.    Time  6    Period  Weeks    Status  New    Target Date  08/17/19            Plan - 07/18/19 1804    Clinical Impression Statement  Patient presents with good motivation to physical therapy session. He continues to demonstrate left hemi neglect requiring cueing and directional gaze education for negotiating interventions and surrounding areas. Forward trunk posture worsens as patient fatigues resulting in an off centered COM and increases his instability. Use of holding an object in one hand to decrease his UE support is successful this session. Patient will benefit from skilled physical therapy to increase balance, strength, and functional mobility for decreased fall risk    Personal Factors and Comorbidities  Age;Comorbidity 3+;Fitness;Past/Current Experience;Time since onset of injury/illness/exacerbation    Comorbidities  balance, left homonymous hemianopsia, CKD, HLD, Hypothyroidism, osteoprosis,    Examination-Activity Limitations  Bathing;Caring for Others;Carry;Dressing;Locomotion Level;Lift;Reach Overhead;Squat;Stand;Transfers;Stairs    Examination-Participation Restrictions  Church;Cleaning;Community Activity;Driving;Interpersonal Relationship;Laundry;Volunteer;Shop;School;Meal Prep;Yard Work    [employer] officer  Evolving/Moderate complexity    Rehab Potential  Fair    PT Frequency  2x / week    PT Duration  8 weeks    PT  Treatment/Interventions  ADLs/Self Care Home Management;Aquatic Therapy;Biofeedback;Cryotherapy;Electrical Stimulation;Iontophoresis 4mg /ml Dexamethasone;Moist Heat;Traction;Ultrasound;Parrafin;Therapeutic exercise;Therapeutic activities;Functional mobility training;Stair training;Gait training;DME Instruction;Balance training;Neuromuscular re-education;Patient/family education;Orthotic Fit/Training;Cognitive remediation;Manual techniques;Passive range of motion;Energy conservation;Splinting;Taping;Vestibular;Visual/perceptual remediation/compensation       Patient will benefit from skilled therapeutic intervention in order to improve the following deficits and impairments:  Abnormal gait, Cardiopulmonary status limiting activity, Decreased activity tolerance, Decreased balance, Decreased knowledge of precautions, Decreased endurance, Decreased coordination, Decreased cognition, Decreased knowledge of use of DME, Decreased mobility, Decreased safety awareness, Difficulty walking, Decreased strength, Impaired flexibility, Impaired perceived functional ability, Impaired UE functional use, Impaired tone, Postural dysfunction, Improper body mechanics  Visit Diagnosis: Muscle weakness (generalized)  Difficulty walking     Problem List Patient Active Problem List   Diagnosis Date Noted  . Oropharyngeal dysphagia 06/21/2019  . Moderate protein-calorie malnutrition ([place]) 06/21/2019  . Balance disorder 06/21/2019  . Left homonymous hemianopsia 06/21/2019  . Pain due to onychomycosis of toenails of both feet 01/03/2019  . Lewy body dementia without behavioral disturbance ([place]) 07/21/2017  . Elevated PSA 12/09/2016  . Vitamin D deficiency 12/09/2016  . Chronic pain of right knee 11/04/2016  . Hyperlipidemia 11/04/2016  . CKD (chronic kidney disease), stage III 11/04/2016  . Osteoarthritis of multiple joints 11/03/2016  . Chronic right hip pain 11/03/2016  . BPH with obstruction/lower urinary tract  symptoms 11/03/2016  . Hypothyroidism 11/03/2014  . Asthma 11/03/2014  . Osteoporosis 11/03/2014  . Skin lesion of chest wall 11/03/2014  . Trigger finger of  both hands 11/03/2014   Janna Arch, PT, DPT   07/18/2019, 6:05 PM  [place] MAIN [facility] SERVICES [address] [place], [place], [address] Phone: [phone]   Fax:  [phone]  Name: Blake Bates MRN: [id number] Date of Birth: April 28, 1941

## 2019-07-20 ENCOUNTER — Ambulatory Visit: Payer: Medicare Other

## 2019-07-20 ENCOUNTER — Other Ambulatory Visit: Payer: Self-pay

## 2019-07-20 DIAGNOSIS — R262 Difficulty in walking, not elsewhere classified: Secondary | ICD-10-CM

## 2019-07-20 DIAGNOSIS — M6281 Muscle weakness (generalized): Secondary | ICD-10-CM | POA: Diagnosis not present

## 2019-07-20 NOTE — Therapy (Signed)
[place] MAIN [facility] SERVICES [address] [place], [place], [address] Phone: [phone]   Fax:  [phone]  Physical Therapy Treatment  Patient Details  Name: Blake Bates MRN: [id number] Date of Birth: [birth year] No data recorded  Encounter Date: 07/20/2019  PT End of Session - 07/20/19 1812    Visit Number  5    Number of Visits  16    Date for PT Re-Evaluation  08/31/19    Authorization - Number of Visits  5    Progress Note Due on Visit  10    PT Start Time  1430    PT Stop Time  [id number]    PT Time Calculation (min)  44 min    Equipment Utilized During Treatment  Gait belt;Other (comment)   FWW   Behavior During Therapy  WFL for tasks assessed/performed       Past Medical History:  Diagnosis Date  . Asthma   . Diverticulosis   . Full dentures   . Hyperlipidemia   . Hypothyroidism    s/p subtotal thyroidectomy  . Muscle spasms of lower extremity    Right upper leg  . Osteoporosis   . Rhinitis, allergic    pollens, mold, animal dander    Past Surgical History:  Procedure Laterality Date  . CATARACT EXTRACTION W/PHACO Left 09/20/2014   Procedure: CATARACT EXTRACTION PHACO AND INTRAOCULAR LENS PLACEMENT (IOC);  Surgeon: Leandrew Koyanagi, MD;  Location: [place];  Service: Ophthalmology;  Laterality: Left;  . COLONOSCOPY    . PARATHYROIDECTOMY    . THYROID SURGERY     subtotal    There were no vitals filed for this visit.  Subjective Assessment - 07/20/19 1804    Subjective  Patient presents with wife to physical therapy session. Wife reports he is "feeling in a funk" today. Reports no falls or LOB since last session. Reports that he has been walking to the mailbox and to get the mail again for the first time in a long time.    Patient is accompained by:  Family member    Pertinent History  Pt has Lewy Body dementia and Parkinson's disease, and has difficulty walking due to weakness and balance  deficits, as well as difficulty with motor planning and decision making.  Pt is a high fall risk and has fallen 5x in the past 2 weeks.    Limitations  Standing;Walking;House hold activities    How long can you sit comfortably?  unlimited    How long can you stand comfortably?  5 minutes    How long can you walk comfortably?  2-3 minutes, must hold onto things due to weakness and imbalance    Patient Stated Goals  "to walk better, and feel stronger"    Currently in Pain?  No/denies          Treatment:   Nustep Lvl 1 3 minutes RPM> 30 for cardiovascular challenge   Neuromuscular Re-education cues for safety awareness, CGA for all standing interventions.  Speed ladder: one foot in each square for increased BOS , step length, and quality of gait mechanics,  BUE support 8x length of // bars; max cueing and min A required for performance  Airex pad static stand no UE support x 30s; Airex pad soccer ball chest press 10x Static stand with balloon taps against mirror overhead to the side and infront with encouragement provided for patient to follow ball with head/eyes  X 2 minutes with mod assistance  required  Modified single limb stability with one foot on soccer ball one foot on ground, BUE support on bar. Foot on ball performing hamstring rollouts on ball x60 secondes each LE   Forward ambulation with backwards return to initial position, max cueing for upright posture and decreasing UE support x 6 trials lengths of // bars  TherEx cues for body mechanics, sequencing and task orientation  Sit to stand without UE support x 10 from standard height chair Standing 2.5 ankle weight marching with PT foot between patients 15x each LE Standing 2.5 ankle weight hip abduction 10x each LE BUE support  Seated balloon taps reaching inside/outside BOS for coordination, timing of muscle recruitment, stability, and core activation,  Education and performance of performing "tall posture" in seated for 10  second holds  Seated RTB around knees marching with upright posture 10x each LE         Patient demonstrates left hemi neglect throughout session with frequent tactile, verbal, and visual cueing required for proper body mechanics and task orientation. He is able to complete all exercises but requiring heavy cues and performance is somewhat limited.                         PT Education - 07/20/19 1812    Education Details  exercise technique, body mechanics    Person(s) Educated  Patient    Methods  Explanation;Demonstration;Tactile cues;Verbal cues    Comprehension  Verbalized understanding;Returned demonstration;Verbal cues required;Tactile cues required       PT Short Term Goals - 07/06/19 1506      PT SHORT TERM GOAL #1   Title  Pt and caregiver will be educated in a new HEP for overall LE and UE strengthening, and kitchen sink balance exercises to decrease fall risk.    Baseline  No current exercises per caregiver    Time  1    Period  Weeks    Status  New    Target Date  07/13/19      PT SHORT TERM GOAL #2   Title  Pt and wife will be educated in proper use of assistive devices and safety techniques at home (increased lighting, removal of area rugs etc) to decrease fall risk    Baseline  Currently no education on home equipment and not using assistive devices.    Time  1    Period  Weeks    Status  New    Target Date  07/13/19        PT Long Term Goals - 07/06/19 1508      PT LONG TERM GOAL #1   Title  Pt will have increased overall UE gross strength by at least 1/2-1 muscle grade throughout to decrease his fall risk and improve his ability to ambulate further distances safely.    Baseline  Pt currently at 2+ to 3+/5 grossly throughout UE's, and 3 to 4/5 grossly throughout LE's (see objective notes)    Time  8    Period  Weeks    Status  New    Target Date  08/31/19      PT LONG TERM GOAL #2   Title  Pt's caregiver will report no greater  than 0-1 falls over 1 week period due to improved balance and strength, and improved saftey techniques including assistive device.    Baseline  Pt currently falling about 5 times over 2-week period    Time  8  Period  Weeks    Status  New    Target Date  08/31/19      PT LONG TERM GOAL #3   Title  Pt will have improved Timed Up and Go score to at least 1 minute or better, indicating decreased fall risk since initial PT evaluation.    Baseline  Pt performed TUG in 1 minute and 30 seconds at initial evaluation.    Time  8    Period  Weeks    Status  New    Target Date  08/31/19      PT LONG TERM GOAL #4   Title  Pt will have improved 5 Times Sit to Stand score to at least 20 seconds or better, indicating decreased fall risk since time of initial evaluation.    Baseline  Pt performed 5 Times Sit to Stand in 37 seconds at initial evaluation.    Time  6    Period  Weeks    Status  New    Target Date  08/17/19      PT LONG TERM GOAL #5   Title  Pt will ascend/ descend 4 stairs safely with use of B handrails with SB/CGA to decrease fall risk.    Baseline  Pt currently trips of the 4 steps he uses to enter/exit his home per wife's report.    Time  6    Period  Weeks    Status  New    Target Date  08/17/19            Plan - 07/20/19 1814    Clinical Impression Statement  Patient is challenged with maintaining upright posture, He has heavy preference for gazing at feet and is challenged with spatial awareness. Max cueing is required throughout session for body mechanics and stability. Forward trunk posture worsens as patient fatigues resulting in an off centered COM and increases his instability. Patient will benefit from skilled physical therapy to increase balance, strength, and functional mobility for decreased fall risk    Personal Factors and Comorbidities  Age;Comorbidity 3+;Fitness;Past/Current Experience;Time since onset of injury/illness/exacerbation    Comorbidities   balance, left homonymous hemianopsia, CKD, HLD, Hypothyroidism, osteoprosis,    Examination-Activity Limitations  Bathing;Caring for Others;Carry;Dressing;Locomotion Level;Lift;Reach Overhead;Squat;Stand;Transfers;Stairs    Examination-Participation Restrictions  Church;Cleaning;Community Activity;Driving;Interpersonal Relationship;Laundry;Volunteer;Shop;School;Meal Prep;Yard Work    [employer] officer  Evolving/Moderate complexity    Rehab Potential  Fair    PT Frequency  2x / week    PT Duration  8 weeks    PT Treatment/Interventions  ADLs/Self Care Home Management;Aquatic Therapy;Biofeedback;Cryotherapy;Electrical Stimulation;Iontophoresis 4mg /ml Dexamethasone;Moist Heat;Traction;Ultrasound;Parrafin;Therapeutic exercise;Therapeutic activities;Functional mobility training;Stair training;Gait training;DME Instruction;Balance training;Neuromuscular re-education;Patient/family education;Orthotic Fit/Training;Cognitive remediation;Manual techniques;Passive range of motion;Energy conservation;Splinting;Taping;Vestibular;Visual/perceptual remediation/compensation       Patient will benefit from skilled therapeutic intervention in order to improve the following deficits and impairments:  Abnormal gait, Cardiopulmonary status limiting activity, Decreased activity tolerance, Decreased balance, Decreased knowledge of precautions, Decreased endurance, Decreased coordination, Decreased cognition, Decreased knowledge of use of DME, Decreased mobility, Decreased safety awareness, Difficulty walking, Decreased strength, Impaired flexibility, Impaired perceived functional ability, Impaired UE functional use, Impaired tone, Postural dysfunction, Improper body mechanics  Visit Diagnosis: Muscle weakness (generalized)  Difficulty walking     Problem List Patient Active Problem List   Diagnosis Date Noted  . Oropharyngeal dysphagia 06/21/2019  . Moderate protein-calorie malnutrition ([place])  06/21/2019  . Balance disorder 06/21/2019  . Left homonymous hemianopsia 06/21/2019  . Pain due to onychomycosis of toenails of both feet 01/03/2019  .  Lewy body dementia without behavioral disturbance ([place]) 07/21/2017  . Elevated PSA 12/09/2016  . Vitamin D deficiency 12/09/2016  . Chronic pain of right knee 11/04/2016  . Hyperlipidemia 11/04/2016  . CKD (chronic kidney disease), stage III 11/04/2016  . Osteoarthritis of multiple joints 11/03/2016  . Chronic right hip pain 11/03/2016  . 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 Hypothyroidism 11/03/2014  . Asthma 11/03/2014  . Osteoporosis 11/03/2014  . Skin lesion of chest wall 11/03/2014  . Trigger finger of both hands 11/03/2014   Janna Arch, PT, DPT    07/20/2019, 6:15 PM  [place] MAIN [facility] SERVICES [address] [place], [place], [address] Phone: [phone]   Fax:  [phone]  Name: HANNAN MCNAMAR MRN: [id number] Date of Birth: Mar 21, 1941

## 2019-07-25 ENCOUNTER — Other Ambulatory Visit: Payer: Self-pay

## 2019-07-25 ENCOUNTER — Ambulatory Visit: Payer: Medicare Other

## 2019-07-25 DIAGNOSIS — R262 Difficulty in walking, not elsewhere classified: Secondary | ICD-10-CM | POA: Diagnosis not present

## 2019-07-25 DIAGNOSIS — M6281 Muscle weakness (generalized): Secondary | ICD-10-CM | POA: Diagnosis not present

## 2019-07-25 NOTE — Therapy (Signed)
[place] MAIN [facility] SERVICES [address] [place], [place], [address] Phone: [phone]   Fax:  [phone]  Physical Therapy Treatment  Patient Details  Name: Blake Bates MRN: [id number] Date of Birth: July 30, 1940 No data recorded  Encounter Date: 07/25/2019  PT End of Session - 07/25/19 1654    Visit Number  6    Number of Visits  16    Date for PT Re-Evaluation  08/31/19    Authorization - Number of Visits  6    Progress Note Due on Visit  10    PT Start Time  1600    PT Stop Time  1646    PT Time Calculation (min)  46 min    Equipment Utilized During Treatment  Gait belt;Other (comment)   FWW   Behavior During Therapy  WFL for tasks assessed/performed       Past Medical History:  Diagnosis Date  . Asthma   . Diverticulosis   . Full dentures   . Hyperlipidemia   . Hypothyroidism    s/p subtotal thyroidectomy  . Muscle spasms of lower extremity    Right upper leg  . Osteoporosis   . Rhinitis, allergic    pollens, mold, animal dander    Past Surgical History:  Procedure Laterality Date  . CATARACT EXTRACTION W/PHACO Left 09/20/2014   Procedure: CATARACT EXTRACTION PHACO AND INTRAOCULAR LENS PLACEMENT (IOC);  Surgeon: Leandrew Koyanagi, MD;  Location: [place];  Service: Ophthalmology;  Laterality: Left;  . COLONOSCOPY    . PARATHYROIDECTOMY    . THYROID SURGERY     subtotal    There were no vitals filed for this visit.  Subjective Assessment - 07/25/19 1606    Subjective  Patient and wife report he was able to go get the paper over the weekend and help take the trash out. No falls over the weekend.    Patient is accompained by:  Family member    Pertinent History  Pt has Lewy Body dementia and Parkinson's disease, and has difficulty walking due to weakness and balance deficits, as well as difficulty with motor planning and decision making.  Pt is a high fall risk and has fallen 5x in the past 2  weeks.    Limitations  Standing;Walking;House hold activities    How long can you sit comfortably?  unlimited    How long can you stand comfortably?  5 minutes    How long can you walk comfortably?  2-3 minutes, must hold onto things due to weakness and imbalance    Patient Stated Goals  "to walk better, and feel stronger"    Currently in Pain?  No/denies           Treatment:    Nustep Lvl 1 3 minutes RPM> 30-40rpm for cardiovascular challenge    Neuromuscular Re-education cues for safety awareness, CGA for all standing interventions.  airex pad: throwing small velcro balls at target for pertubation's, reach, and stability with SUE support, x 20 balls airex pad: 6" step step ups BUE support, 10x each LE, cueing for alternating LE's, cues for upright posture  Large step over red theraband BUE support, clap once stepped over 10x each LE, additional 5x each LE with SUE support Negotiate obstacles by counterclockwise ambulation around gym with SUE support from PT, cueing for object negotiation for reduction of instability, increased reliance upon UE support with fatigue,    TherEx cues for body mechanics, sequencing and task orientation  Sit to stand without UE support x 10 from plinth table with arms crossed.  GTB abduction 10x each LE; cues for muscle activation to PT hand  Modified windmills 10x each side, max visual and verbal cueing for cross body coordination  Scapular retractions 10x   Education and performance of performing "tall posture" in seated for 10 second holds           Patient demonstrates left hemi neglect throughout session with frequent tactile, verbal, and visual cueing required for proper body mechanics and task orientation. He is able to complete all exercises but requiring heavy cues and performance is somewhat limited.                    PT Education - 07/25/19 1607    Education Details  exercise technique, body mechanics    Person(s)  Educated  Patient    Methods  Explanation;Demonstration;Tactile cues;Verbal cues    Comprehension  Verbalized understanding;Returned demonstration;Verbal cues required;Tactile cues required       PT Short Term Goals - 07/06/19 1506      PT SHORT TERM GOAL #1   Title  Pt and caregiver will be educated in a new HEP for overall LE and UE strengthening, and kitchen sink balance exercises to decrease fall risk.    Baseline  No current exercises per caregiver    Time  1    Period  Weeks    Status  New    Target Date  07/13/19      PT SHORT TERM GOAL #2   Title  Pt and wife will be educated in proper use of assistive devices and safety techniques at home (increased lighting, removal of area rugs etc) to decrease fall risk    Baseline  Currently no education on home equipment and not using assistive devices.    Time  1    Period  Weeks    Status  New    Target Date  07/13/19        PT Long Term Goals - 07/06/19 1508      PT LONG TERM GOAL #1   Title  Pt will have increased overall UE gross strength by at least 1/2-1 muscle grade throughout to decrease his fall risk and improve his ability to ambulate further distances safely.    Baseline  Pt currently at 2+ to 3+/5 grossly throughout UE's, and 3 to 4/5 grossly throughout LE's (see objective notes)    Time  8    Period  Weeks    Status  New    Target Date  08/31/19      PT LONG TERM GOAL #2   Title  Pt's caregiver will report no greater than 0-1 falls over 1 week period due to improved balance and strength, and improved saftey techniques including assistive device.    Baseline  Pt currently falling about 5 times over 2-week period    Time  8    Period  Weeks    Status  New    Target Date  08/31/19      PT LONG TERM GOAL #3   Title  Pt will have improved Timed Up and Go score to at least 1 minute or better, indicating decreased fall risk since initial PT evaluation.    Baseline  Pt performed TUG in 1 minute and 30 seconds at  initial evaluation.    Time  8    Period  Weeks    Status  New  Target Date  08/31/19      PT LONG TERM GOAL #4   Title  Pt will have improved 5 Times Sit to Stand score to at least 20 seconds or better, indicating decreased fall risk since time of initial evaluation.    Baseline  Pt performed 5 Times Sit to Stand in 37 seconds at initial evaluation.    Time  6    Period  Weeks    Status  New    Target Date  08/17/19      PT LONG TERM GOAL #5   Title  Pt will ascend/ descend 4 stairs safely with use of B handrails with SB/CGA to decrease fall risk.    Baseline  Pt currently trips of the 4 steps he uses to enter/exit his home per wife's report.    Time  6    Period  Weeks    Status  New    Target Date  08/17/19            Plan - 07/25/19 1654    Clinical Impression Statement  Patient is progressing with stability with decreasing UE support with dynamic motion. Increased capacity for prolonged mobility noted with patient negotiating PT gym for first time with SUE support/single hand hold support and close CGA and max cueing for visual scanning and object negotiation. Patient will benefit from skilled physical therapy to increase balance, strength, and functional mobility for decreased fall risk    Personal Factors and Comorbidities  Age;Comorbidity 3+;Fitness;Past/Current Experience;Time since onset of injury/illness/exacerbation    Comorbidities  balance, left homonymous hemianopsia, CKD, HLD, Hypothyroidism, osteoprosis,    Examination-Activity Limitations  Bathing;Caring for Others;Carry;Dressing;Locomotion Level;Lift;Reach Overhead;Squat;Stand;Transfers;Stairs    Examination-Participation Restrictions  Church;Cleaning;Community Activity;Driving;Interpersonal Relationship;Laundry;Volunteer;Shop;School;Meal Prep;Yard Work    [employer] officer  Evolving/Moderate complexity    Rehab Potential  Fair    PT Frequency  2x / week    PT Duration  8 weeks    PT  Treatment/Interventions  ADLs/Self Care Home Management;Aquatic Therapy;Biofeedback;Cryotherapy;Electrical Stimulation;Iontophoresis 4mg /ml Dexamethasone;Moist Heat;Traction;Ultrasound;Parrafin;Therapeutic exercise;Therapeutic activities;Functional mobility training;Stair training;Gait training;DME Instruction;Balance training;Neuromuscular re-education;Patient/family education;Orthotic Fit/Training;Cognitive remediation;Manual techniques;Passive range of motion;Energy conservation;Splinting;Taping;Vestibular;Visual/perceptual remediation/compensation       Patient will benefit from skilled therapeutic intervention in order to improve the following deficits and impairments:  Abnormal gait, Cardiopulmonary status limiting activity, Decreased activity tolerance, Decreased balance, Decreased knowledge of precautions, Decreased endurance, Decreased coordination, Decreased cognition, Decreased knowledge of use of DME, Decreased mobility, Decreased safety awareness, Difficulty walking, Decreased strength, Impaired flexibility, Impaired perceived functional ability, Impaired UE functional use, Impaired tone, Postural dysfunction, Improper body mechanics  Visit Diagnosis: Muscle weakness (generalized)  Difficulty walking     Problem List Patient Active Problem List   Diagnosis Date Noted  . Oropharyngeal dysphagia 06/21/2019  . Moderate protein-calorie malnutrition ([place]) 06/21/2019  . Balance disorder 06/21/2019  . Left homonymous hemianopsia 06/21/2019  . Pain due to onychomycosis of toenails of both feet 01/03/2019  . Lewy body dementia without behavioral disturbance ([place]) 07/21/2017  . Elevated PSA 12/09/2016  . Vitamin D deficiency 12/09/2016  . Chronic pain of right knee 11/04/2016  . Hyperlipidemia 11/04/2016  . CKD (chronic kidney disease), stage III 11/04/2016  . Osteoarthritis of multiple joints 11/03/2016  . Chronic right hip pain 11/03/2016  . BPH with obstruction/lower urinary tract  symptoms 11/03/2016  . Hypothyroidism 11/03/2014  . Asthma 11/03/2014  . Osteoporosis 11/03/2014  . Skin lesion of chest wall 11/03/2014  . Trigger finger of both hands 11/03/2014   Janna Arch, PT,  DPT   07/25/2019, 4:55 PM  [place] MAIN [facility] SERVICES [address] [place], [place], [address] Phone: [phone]   Fax:  [phone]  Name: RODERIC SAYER MRN: [id number] Date of Birth: 1940-07-03

## 2019-07-27 ENCOUNTER — Other Ambulatory Visit: Payer: Self-pay

## 2019-07-27 ENCOUNTER — Ambulatory Visit: Payer: Medicare Other

## 2019-07-27 DIAGNOSIS — R262 Difficulty in walking, not elsewhere classified: Secondary | ICD-10-CM

## 2019-07-27 DIAGNOSIS — M6281 Muscle weakness (generalized): Secondary | ICD-10-CM

## 2019-07-27 NOTE — Therapy (Signed)
[place] MAIN [facility] SERVICES [address] [place], [place], [address] Phone: [phone]   Fax:  [phone]  Physical Therapy Treatment  Patient Details  Name: Blake Bates MRN: [id number] Date of Birth: [birth year] No data recorded  Encounter Date: 07/27/2019  PT End of Session - 07/27/19 0853    Visit Number  7    Number of Visits  16    Date for PT Re-Evaluation  08/31/19    Authorization - Number of Visits  7    Progress Note Due on Visit  10    PT Start Time  0845    PT Stop Time  0929    PT Time Calculation (min)  44 min    Equipment Utilized During Treatment  Gait belt;Other (comment)   FWW   Behavior During Therapy  WFL for tasks assessed/performed       Past Medical History:  Diagnosis Date  . Asthma   . Diverticulosis   . Full dentures   . Hyperlipidemia   . Hypothyroidism    s/p subtotal thyroidectomy  . Muscle spasms of lower extremity    Right upper leg  . Osteoporosis   . Rhinitis, allergic    pollens, mold, animal dander    Past Surgical History:  Procedure Laterality Date  . CATARACT EXTRACTION W/PHACO Left 09/20/2014   Procedure: CATARACT EXTRACTION PHACO AND INTRAOCULAR LENS PLACEMENT (IOC);  Surgeon: Leandrew Koyanagi, MD;  Location: [place];  Service: Ophthalmology;  Laterality: Left;  . COLONOSCOPY    . PARATHYROIDECTOMY    . THYROID SURGERY     subtotal    There were no vitals filed for this visit.  Subjective Assessment - 07/27/19 0851    Subjective  Patient and wife present to PT, patient is very fatigued and sleepy due to earlier start time of session. No falls or LOB since last session.    Patient is accompained by:  Family member    Pertinent History  Pt has Lewy Body dementia and Parkinson's disease, and has difficulty walking due to weakness and balance deficits, as well as difficulty with motor planning and decision making.  Pt is a high fall risk and has fallen 5x in the  past 2 weeks.    Limitations  Standing;Walking;House hold activities    How long can you sit comfortably?  unlimited    How long can you stand comfortably?  5 minutes    How long can you walk comfortably?  2-3 minutes, must hold onto things due to weakness and imbalance    Patient Stated Goals  "to walk better, and feel stronger"    Currently in Pain?  No/denies           Nustep Lvl 1 3 minutes RPM> 30-40rpm for cardiovascular challenge    Neuromuscular Re-education cues for safety awareness, CGA for all standing interventions.  airex pad: throwing small velcro balls at target for pertubation's, reach, and stability with SUE support, x 20 balls; made 5 Step over consecutive objects in // bars, orange hurdles (2), half foam rollers (2). BUE support 6x length of // bars Negotiate obstacles by counterclockwise ambulation around gym with SUE support from PT, cueing for object negotiation for reduction of instability, increased reliance upon UE support with fatigue,  Ambulation without UE support while negotiating weaving around 3 cones, very challenging when weaving to the left with frequent running into of cone x 4 trials Negotiate ambulate 10 ft, turn around cone, then  return 10 ft to chair, more challenging turning to the left than the right, patient forgetting midtask what he is doing   TherEx cues for body mechanics, sequencing and task orientation  Sit to stand without UE support x 10 from plinth table with arms crossed.   Rainbow ball toss 15x for coordination, spatial awareness, and use of LUE with upright posture   Balloon taps reaching inside/outside BOS for upright posture, L sided awareness, coordination  X 3 minutes   Education and performance of performing "tall posture" in seated for 10 second holds    Patient requires frequent task orientation and sequencing cues for memory. Use of visual demonstration in combination to verbal cues results in best response. Frequent cues  are required for L visual field due to L hemi-neglect.    Pt educated throughout session about proper posture and technique with exercises. Improved exercise technique, movement at target joints, use of target muscles after min to mod verbal, visual, tactile cues.       Patient demonstrates left hemi neglect throughout session with frequent tactile, verbal, and visual cueing required for proper body mechanics and task orientation. He is able to complete all exercises but requiring heavy cues and performance is somewhat limited.                     PT Education - 07/27/19 [phone]    Education Details  exercise technique, body mechanics    Person(s) Educated  Patient    Methods  Explanation;Demonstration;Tactile cues;Verbal cues    Comprehension  Verbalized understanding;Returned demonstration;Verbal cues required;Tactile cues required       PT Short Term Goals - 07/06/19 1506      PT SHORT TERM GOAL #1   Title  Pt and caregiver will be educated in a new HEP for overall LE and UE strengthening, and kitchen sink balance exercises to decrease fall risk.    Baseline  No current exercises per caregiver    Time  1    Period  Weeks    Status  New    Target Date  07/13/19      PT SHORT TERM GOAL #2   Title  Pt and wife will be educated in proper use of assistive devices and safety techniques at home (increased lighting, removal of area rugs etc) to decrease fall risk    Baseline  Currently no education on home equipment and not using assistive devices.    Time  1    Period  Weeks    Status  New    Target Date  07/13/19        PT Long Term Goals - 07/06/19 1508      PT LONG TERM GOAL #1   Title  Pt will have increased overall UE gross strength by at least 1/2-1 muscle grade throughout to decrease his fall risk and improve his ability to ambulate further distances safely.    Baseline  Pt currently at 2+ to 3+/5 grossly throughout UE's, and 3 to 4/5 grossly throughout  LE's (see objective notes)    Time  8    Period  Weeks    Status  New    Target Date  08/31/19      PT LONG TERM GOAL #2   Title  Pt's caregiver will report no greater than 0-1 falls over 1 week period due to improved balance and strength, and improved saftey techniques including assistive device.    Baseline  Pt currently falling  about 5 times over 2-week period    Time  8    Period  Weeks    Status  New    Target Date  08/31/19      PT LONG TERM GOAL #3   Title  Pt will have improved Timed Up and Go score to at least 1 minute or better, indicating decreased fall risk since initial PT evaluation.    Baseline  Pt performed TUG in 1 minute and 30 seconds at initial evaluation.    Time  8    Period  Weeks    Status  New    Target Date  08/31/19      PT LONG TERM GOAL #4   Title  Pt will have improved 5 Times Sit to Stand score to at least 20 seconds or better, indicating decreased fall risk since time of initial evaluation.    Baseline  Pt performed 5 Times Sit to Stand in 37 seconds at initial evaluation.    Time  6    Period  Weeks    Status  New    Target Date  08/17/19      PT LONG TERM GOAL #5   Title  Pt will ascend/ descend 4 stairs safely with use of B handrails with SB/CGA to decrease fall risk.    Baseline  Pt currently trips of the 4 steps he uses to enter/exit his home per wife's report.    Time  6    Period  Weeks    Status  New    Target Date  08/17/19            Plan - 07/27/19 1233    Clinical Impression Statement  Patient is very fatigued at beginning of session requiring more frequent cueing for task orientation throughout task due to forgetting what he is supposed to be doing. Cues for left visual field/left side of body required with patient demonstrating increased ability to recognize objects and utilize LUE with repetition. Use of objects that are familiar to patient optimize his response and ability to perform the task at hand. Patient will benefit  from skilled physical therapy to increase balance, strength, and functional mobility for decreased fall risk    Personal Factors and Comorbidities  Age;Comorbidity 3+;Fitness;Past/Current Experience;Time since onset of injury/illness/exacerbation    Comorbidities  balance, left homonymous hemianopsia, CKD, HLD, Hypothyroidism, osteoprosis,    Examination-Activity Limitations  Bathing;Caring for Others;Carry;Dressing;Locomotion Level;Lift;Reach Overhead;Squat;Stand;Transfers;Stairs    Examination-Participation Restrictions  Church;Cleaning;Community Activity;Driving;Interpersonal Relationship;Laundry;Volunteer;Shop;School;Meal Prep;Yard Work    [employer]  Evolving/Moderate complexity    Rehab Potential  Fair    PT Frequency  2x / week    PT Duration  8 weeks    PT Treatment/Interventions  ADLs/Self Care Home Management;Aquatic Therapy;Biofeedback;Cryotherapy;Electrical Stimulation;Iontophoresis 4mg /ml Dexamethasone;Moist Heat;Traction;Ultrasound;Parrafin;Therapeutic exercise;Therapeutic activities;Functional mobility training;Stair training;Gait training;DME Instruction;Balance training;Neuromuscular re-education;Patient/family education;Orthotic Fit/Training;Cognitive remediation;Manual techniques;Passive range of motion;Energy conservation;Splinting;Taping;Vestibular;Visual/perceptual remediation/compensation       Patient will benefit from skilled therapeutic intervention in order to improve the following deficits and impairments:  Abnormal gait, Cardiopulmonary status limiting activity, Decreased activity tolerance, Decreased balance, Decreased knowledge of precautions, Decreased endurance, Decreased coordination, Decreased cognition, Decreased knowledge of use of DME, Decreased mobility, Decreased safety awareness, Difficulty walking, Decreased strength, Impaired flexibility, Impaired perceived functional ability, Impaired UE functional use, Impaired tone, Postural  dysfunction, Improper body mechanics  Visit Diagnosis: Muscle weakness (generalized)  Difficulty walking     Problem List Patient Active Problem List   Diagnosis Date Noted  .  Oropharyngeal dysphagia 06/21/2019  . Moderate protein-calorie malnutrition ([place]) 06/21/2019  . Balance disorder 06/21/2019  . Left homonymous hemianopsia 06/21/2019  . Pain due to onychomycosis of toenails of both feet 01/03/2019  . Lewy body dementia without behavioral disturbance ([place]) 07/21/2017  . Elevated PSA 12/09/2016  . Vitamin D deficiency 12/09/2016  . Chronic pain of right knee 11/04/2016  . Hyperlipidemia 11/04/2016  . CKD (chronic kidney disease), stage III 11/04/2016  . Osteoarthritis of multiple joints 11/03/2016  . Chronic right hip pain 11/03/2016  . 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 Hypothyroidism 11/03/2014  . Asthma 11/03/2014  . Osteoporosis 11/03/2014  . Skin lesion of chest wall 11/03/2014  . Trigger finger of both hands 11/03/2014   Janna Arch, PT, DPT   07/27/2019, 12:35 PM  [place] MAIN [facility] SERVICES [address] [place], [place], [address] Phone: [phone]   Fax:  [phone]  Name: OATHER GONSALVES MRN: [id number] Date of Birth: 17-Apr-1941

## 2019-08-01 ENCOUNTER — Other Ambulatory Visit: Payer: Self-pay

## 2019-08-01 ENCOUNTER — Ambulatory Visit: Payer: Medicare Other

## 2019-08-01 DIAGNOSIS — M6281 Muscle weakness (generalized): Secondary | ICD-10-CM

## 2019-08-01 DIAGNOSIS — R262 Difficulty in walking, not elsewhere classified: Secondary | ICD-10-CM | POA: Diagnosis not present

## 2019-08-01 NOTE — Therapy (Signed)
[place] MAIN [facility] SERVICES [address] [place], [place], [address] Phone: [phone]   Fax:  [phone]  Physical Therapy Treatment  Patient Details  Name: Blake Bates MRN: [id number] Date of Birth: 16-Sep-1940 No data recorded  Encounter Date: 08/01/2019  PT End of Session - 08/01/19 1353    Visit Number  8    Number of Visits  16    Date for PT Re-Evaluation  08/31/19    Authorization - Number of Visits  8    Progress Note Due on Visit  10    PT Start Time  1344    PT Stop Time  1429    PT Time Calculation (min)  45 min    Equipment Utilized During Treatment  Gait belt;Other (comment)   FWW   Behavior During Therapy  WFL for tasks assessed/performed       Past Medical History:  Diagnosis Date  . Asthma   . Diverticulosis   . Full dentures   . Hyperlipidemia   . Hypothyroidism    s/p subtotal thyroidectomy  . Muscle spasms of lower extremity    Right upper leg  . Osteoporosis   . Rhinitis, allergic    pollens, mold, animal dander    Past Surgical History:  Procedure Laterality Date  . CATARACT EXTRACTION W/PHACO Left 09/20/2014   Procedure: CATARACT EXTRACTION PHACO AND INTRAOCULAR LENS PLACEMENT (IOC);  Surgeon: Leandrew Koyanagi, MD;  Location: [place];  Service: Ophthalmology;  Laterality: Left;  . COLONOSCOPY    . PARATHYROIDECTOMY    . THYROID SURGERY     subtotal    There were no vitals filed for this visit.  Subjective Assessment - 08/01/19 1351    Subjective  Patient had an eventful weekend with no falls or LOB. Was able to be social. Has been compliant with HEP.    Patient is accompained by:  Family member    Pertinent History  Pt has Lewy Body dementia and Parkinson's disease, and has difficulty walking due to weakness and balance deficits, as well as difficulty with motor planning and decision making.  Pt is a high fall risk and has fallen 5x in the past 2 weeks.    Limitations   Standing;Walking;House hold activities    How long can you sit comfortably?  unlimited    How long can you stand comfortably?  5 minutes    How long can you walk comfortably?  2-3 minutes, must hold onto things due to weakness and imbalance    Patient Stated Goals  "to walk better, and feel stronger"    Currently in Pain?  No/denies         Nustep Lvl 1 3 minutes RPM> 30-40rpm for cardiovascular challenge    Neuromuscular Re-education cues for safety awareness, CGA for all standing interventions.  airex pad sandwiching 6" step lateral step up/down 10x each direction  airex pad: rainbow ball raises 10x, very challenging; requires use of wife for visual cue to look up airex pad: rainbow ball chest press 12x Step over consecutive objects in // bars, orange hurdles (2), half foam rollers (2). BUE support 6x length of // bars High knee marching 6x length of // bars with BUE support 10x  Negotiate ambulate 10 ft, turn around cone, then return 10 ft to chair, more challenging turning to the left than the right, patient forgetting midtask what he is doing    TherEx cues for body mechanics, sequencing and task orientation  Sit to stand without UE support x 10 from plinth table with arms crossed.   Education and performance of performing "tall posture" in seated for 10 second holds   6" step up/down BUE support 12x each LE  Patient requires frequent task orientation and sequencing cues for memory. Use of visual demonstration in combination to verbal cues results in best response. Frequent cues are required for L visual field due to L hemi-neglect.     Pt educated throughout session about proper posture and technique with exercises. Improved exercise technique, movement at target joints, use of target muscles after min to mod verbal, visual, tactile cues.       Patient demonstrates left hemi neglect throughout session with frequent tactile, verbal, and visual cueing required for proper body  mechanics and task orientation. He is able to complete all exercises but requiring heavy cues and performance is somewhat limited                         PT Education - 08/01/19 1352    Education Details  exercise technique, body mechanics    Person(s) Educated  Patient    Methods  Explanation;Demonstration;Tactile cues;Verbal cues    Comprehension  Verbalized understanding;Returned demonstration;Verbal cues required;Tactile cues required       PT Short Term Goals - 07/06/19 1506      PT SHORT TERM GOAL #1   Title  Pt and caregiver will be educated in a new HEP for overall LE and UE strengthening, and kitchen sink balance exercises to decrease fall risk.    Baseline  No current exercises per caregiver    Time  1    Period  Weeks    Status  New    Target Date  07/13/19      PT SHORT TERM GOAL #2   Title  Pt and wife will be educated in proper use of assistive devices and safety techniques at home (increased lighting, removal of area rugs etc) to decrease fall risk    Baseline  Currently no education on home equipment and not using assistive devices.    Time  1    Period  Weeks    Status  New    Target Date  07/13/19        PT Long Term Goals - 07/06/19 1508      PT LONG TERM GOAL #1   Title  Pt will have increased overall UE gross strength by at least 1/2-1 muscle grade throughout to decrease his fall risk and improve his ability to ambulate further distances safely.    Baseline  Pt currently at 2+ to 3+/5 grossly throughout UE's, and 3 to 4/5 grossly throughout LE's (see objective notes)    Time  8    Period  Weeks    Status  New    Target Date  08/31/19      PT LONG TERM GOAL #2   Title  Pt's caregiver will report no greater than 0-1 falls over 1 week period due to improved balance and strength, and improved saftey techniques including assistive device.    Baseline  Pt currently falling about 5 times over 2-week period    Time  8    Period  Weeks     Status  New    Target Date  08/31/19      PT LONG TERM GOAL #3   Title  Pt will have improved Timed Up and Go score to  at least 1 minute or better, indicating decreased fall risk since initial PT evaluation.    Baseline  Pt performed TUG in 1 minute and 30 seconds at initial evaluation.    Time  8    Period  Weeks    Status  New    Target Date  08/31/19      PT LONG TERM GOAL #4   Title  Pt will have improved 5 Times Sit to Stand score to at least 20 seconds or better, indicating decreased fall risk since time of initial evaluation.    Baseline  Pt performed 5 Times Sit to Stand in 37 seconds at initial evaluation.    Time  6    Period  Weeks    Status  New    Target Date  08/17/19      PT LONG TERM GOAL #5   Title  Pt will ascend/ descend 4 stairs safely with use of B handrails with SB/CGA to decrease fall risk.    Baseline  Pt currently trips of the 4 steps he uses to enter/exit his home per wife's report.    Time  6    Period  Weeks    Status  New    Target Date  08/17/19            Plan - 08/01/19 1437    Clinical Impression Statement  Patient presents to physical therapy with good motivation. He is more stable allowing for progression of functional mobility however continues to be reliant upon UE support due to excessive forward trunk posture. Lateral steppage repetition allowed for improved spatial awareness and positional awareness. Patient will benefit from skilled physical therapy to increase balance, strength, and functional mobility for decreased fall risk    Personal Factors and Comorbidities  Age;Comorbidity 3+;Fitness;Past/Current Experience;Time since onset of injury/illness/exacerbation    Comorbidities  balance, left homonymous hemianopsia, CKD, HLD, Hypothyroidism, osteoprosis,    Examination-Activity Limitations  Bathing;Caring for Others;Carry;Dressing;Locomotion Level;Lift;Reach Overhead;Squat;Stand;Transfers;Stairs    Examination-Participation  Restrictions  Church;Cleaning;Community Activity;Driving;Interpersonal Relationship;Laundry;Volunteer;Shop;School;Meal Prep;Yard Work    [employer] officer  Evolving/Moderate complexity    Rehab Potential  Fair    PT Frequency  2x / week    PT Duration  8 weeks    PT Treatment/Interventions  ADLs/Self Care Home Management;Aquatic Therapy;Biofeedback;Cryotherapy;Electrical Stimulation;Iontophoresis 4mg /ml Dexamethasone;Moist Heat;Traction;Ultrasound;Parrafin;Therapeutic exercise;Therapeutic activities;Functional mobility training;Stair training;Gait training;DME Instruction;Balance training;Neuromuscular re-education;Patient/family education;Orthotic Fit/Training;Cognitive remediation;Manual techniques;Passive range of motion;Energy conservation;Splinting;Taping;Vestibular;Visual/perceptual remediation/compensation       Patient will benefit from skilled therapeutic intervention in order to improve the following deficits and impairments:  Abnormal gait, Cardiopulmonary status limiting activity, Decreased activity tolerance, Decreased balance, Decreased knowledge of precautions, Decreased endurance, Decreased coordination, Decreased cognition, Decreased knowledge of use of DME, Decreased mobility, Decreased safety awareness, Difficulty walking, Decreased strength, Impaired flexibility, Impaired perceived functional ability, Impaired UE functional use, Impaired tone, Postural dysfunction, Improper body mechanics  Visit Diagnosis: Muscle weakness (generalized)  Difficulty walking     Problem List Patient Active Problem List   Diagnosis Date Noted  . Oropharyngeal dysphagia 06/21/2019  . Moderate protein-calorie malnutrition ([place]) 06/21/2019  . Balance disorder 06/21/2019  . Left homonymous hemianopsia 06/21/2019  . Pain due to onychomycosis of toenails of both feet 01/03/2019  . Lewy body dementia without behavioral disturbance ([place]) 07/21/2017  . Elevated PSA 12/09/2016  .  Vitamin D deficiency 12/09/2016  . Chronic pain of right knee 11/04/2016  . Hyperlipidemia 11/04/2016  . CKD (chronic kidney disease), stage III 11/04/2016  . Osteoarthritis of multiple  joints 11/03/2016  . Chronic right hip pain 11/03/2016  . 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 Hypothyroidism 11/03/2014  . Asthma 11/03/2014  . Osteoporosis 11/03/2014  . Skin lesion of chest wall 11/03/2014  . Trigger finger of both hands 11/03/2014   Janna Arch, PT, DPT   08/01/2019, 2:46 PM  [place] MAIN [facility] SERVICES [address] [place], [place], [address] Phone: [phone]   Fax:  [phone]  Name: PERRIN MCPARTLAND MRN: [id number] Date of Birth: 1940-10-02

## 2019-08-03 ENCOUNTER — Other Ambulatory Visit: Payer: Self-pay

## 2019-08-03 ENCOUNTER — Ambulatory Visit: Payer: Medicare Other

## 2019-08-03 DIAGNOSIS — M6281 Muscle weakness (generalized): Secondary | ICD-10-CM | POA: Diagnosis not present

## 2019-08-03 DIAGNOSIS — R262 Difficulty in walking, not elsewhere classified: Secondary | ICD-10-CM

## 2019-08-03 NOTE — Therapy (Signed)
[place] MAIN [facility] SERVICES [address] [place], [place], [address] Phone: [phone]   Fax:  [phone]  Physical Therapy Treatment  Patient Details  Name: Blake Bates MRN: [id number] Date of Birth: 1941/04/17 No data recorded  Encounter Date: 08/03/2019  PT End of Session - 08/03/19 1355    Visit Number  9    Number of Visits  16    Date for PT Re-Evaluation  08/31/19    Authorization - Number of Visits  9    Progress Note Due on Visit  10    PT Start Time  [id number]    PT Stop Time  1429    PT Time Calculation (min)  44 min    Equipment Utilized During Treatment  Gait belt;Other (comment)   FWW   Behavior During Therapy  WFL for tasks assessed/performed       Past Medical History:  Diagnosis Date  . Asthma   . Diverticulosis   . Full dentures   . Hyperlipidemia   . Hypothyroidism    s/p subtotal thyroidectomy  . Muscle spasms of lower extremity    Right upper leg  . Osteoporosis   . Rhinitis, allergic    pollens, mold, animal dander    Past Surgical History:  Procedure Laterality Date  . CATARACT EXTRACTION W/PHACO Left 09/20/2014   Procedure: CATARACT EXTRACTION PHACO AND INTRAOCULAR LENS PLACEMENT (IOC);  Surgeon: Leandrew Koyanagi, MD;  Location: [place];  Service: Ophthalmology;  Laterality: Left;  . COLONOSCOPY    . PARATHYROIDECTOMY    . THYROID SURGERY     subtotal    There were no vitals filed for this visit.  Subjective Assessment - 08/03/19 1354    Subjective  Patient reports no falls or LOB since last session. Has been compliant with HEP. Has been walking to his mailbox.    Patient is accompained by:  Family member    Pertinent History  Pt has Lewy Body dementia and Parkinson's disease, and has difficulty walking due to weakness and balance deficits, as well as difficulty with motor planning and decision making.  Pt is a high fall risk and has fallen 5x in the past 2 weeks.    Limitations  Standing;Walking;House hold activities    How long can you sit comfortably?  unlimited    How long can you stand comfortably?  5 minutes    How long can you walk comfortably?  2-3 minutes, must hold onto things due to weakness and imbalance    Patient Stated Goals  "to walk better, and feel stronger"    Currently in Pain?  No/denies           Nustep Lvl 1 4 minutes RPM> 30-40rpm for cardiovascular challenge    Neuromuscular Re-education cues for safety awareness, CGA for all standing interventions.  airex pad: green squishy ball single arm raise against mirror x3 each LE, improved upright posture with repetition airex pad: rainbow ball raises 10x, very challenging; requires use of wife for visual cue to look up airex pad: [place]o ball transfer with LUE support x6 minutes Side Step over consecutive objects in // bars, orange hurdles (2), half foam rollers (2). BUE support 6x length of // bars  Ambulate 96 ft with SUE support, cues for increasing velocity and improved step length , heavy reliance upon UE support from PT with close CGA/Min A  Ambulate 96 ft with CGA and cueing for use of rollator. Education on use and  safety with rollator. Increased gait speed noted with occasional Min A for use of rollator.     TherEx cues for body mechanics, sequencing and task orientation  Sit to stand without UE support x 10 from seat pressing pool noodle to sky seated balloon taps reaching inside/outside BOS, able to reach with LUE with increased repetition. x3 minutes   Patient requires frequent task orientation and sequencing cues for memory. Use of visual demonstration in combination to verbal cues results in best response. Frequent cues are required for L visual field due to L hemi-neglect.     Pt educated throughout session about proper posture and technique with exercises. Improved exercise technique, movement at target joints, use of target muscles after min to mod verbal, visual,  tactile cues.       Patient demonstrates left hemi neglect throughout session with frequent tactile, verbal, and visual cueing required for proper body mechanics and task orientation. He is able to complete all exercises but requiring heavy cues and performance is somewhat limited                       PT Education - 08/03/19 1355    Education Details  exercise technique, body mechanics    Person(s) Educated  Patient    Methods  Explanation;Demonstration;Tactile cues;Verbal cues    Comprehension  Verbalized understanding;Returned demonstration;Verbal cues required;Tactile cues required       PT Short Term Goals - 07/06/19 1506      PT SHORT TERM GOAL #1   Title  Pt and caregiver will be educated in a new HEP for overall LE and UE strengthening, and kitchen sink balance exercises to decrease fall risk.    Baseline  No current exercises per caregiver    Time  1    Period  Weeks    Status  New    Target Date  07/13/19      PT SHORT TERM GOAL #2   Title  Pt and wife will be educated in proper use of assistive devices and safety techniques at home (increased lighting, removal of area rugs etc) to decrease fall risk    Baseline  Currently no education on home equipment and not using assistive devices.    Time  1    Period  Weeks    Status  New    Target Date  07/13/19        PT Long Term Goals - 07/06/19 1508      PT LONG TERM GOAL #1   Title  Pt will have increased overall UE gross strength by at least 1/2-1 muscle grade throughout to decrease his fall risk and improve his ability to ambulate further distances safely.    Baseline  Pt currently at 2+ to 3+/5 grossly throughout UE's, and 3 to 4/5 grossly throughout LE's (see objective notes)    Time  8    Period  Weeks    Status  New    Target Date  08/31/19      PT LONG TERM GOAL #2   Title  Pt's caregiver will report no greater than 0-1 falls over 1 week period due to improved balance and strength, and  improved saftey techniques including assistive device.    Baseline  Pt currently falling about 5 times over 2-week period    Time  8    Period  Weeks    Status  New    Target Date  08/31/19  PT LONG TERM GOAL #3   Title  Pt will have improved Timed Up and Go score to at least 1 minute or better, indicating decreased fall risk since initial PT evaluation.    Baseline  Pt performed TUG in 1 minute and 30 seconds at initial evaluation.    Time  8    Period  Weeks    Status  New    Target Date  08/31/19      PT LONG TERM GOAL #4   Title  Pt will have improved 5 Times Sit to Stand score to at least 20 seconds or better, indicating decreased fall risk since time of initial evaluation.    Baseline  Pt performed 5 Times Sit to Stand in 37 seconds at initial evaluation.    Time  6    Period  Weeks    Status  New    Target Date  08/17/19      PT LONG TERM GOAL #5   Title  Pt will ascend/ descend 4 stairs safely with use of B handrails with SB/CGA to decrease fall risk.    Baseline  Pt currently trips of the 4 steps he uses to enter/exit his home per wife's report.    Time  6    Period  Weeks    Status  New    Target Date  08/17/19            Plan - 08/03/19 1443    Clinical Impression Statement  Patient demonstrates improved use of LUE and scanning of L visual field with repetition allowing for increased postural correction. Patient continues to require frequent task orientation cueing due to forgetting task midsequence. Increased foot clearance noted bilaterally with repetition. Patient will benefit from skilled physical therapy to increase balance, strength, and functional mobility for decreased fall risk    Personal Factors and Comorbidities  Age;Comorbidity 3+;Fitness;Past/Current Experience;Time since onset of injury/illness/exacerbation    Comorbidities  balance, left homonymous hemianopsia, CKD, HLD, Hypothyroidism, osteoprosis,    Examination-Activity Limitations   Bathing;Caring for Others;Carry;Dressing;Locomotion Level;Lift;Reach Overhead;Squat;Stand;Transfers;Stairs    Examination-Participation Restrictions  Church;Cleaning;Community Activity;Driving;Interpersonal Relationship;Laundry;Volunteer;Shop;School;Meal Prep;Yard Work    [employer]  Evolving/Moderate complexity    Rehab Potential  Fair    PT Frequency  2x / week    PT Duration  8 weeks    PT Treatment/Interventions  ADLs/Self Care Home Management;Aquatic Therapy;Biofeedback;Cryotherapy;Electrical Stimulation;Iontophoresis 4mg /ml Dexamethasone;Moist Heat;Traction;Ultrasound;Parrafin;Therapeutic exercise;Therapeutic activities;Functional mobility training;Stair training;Gait training;DME Instruction;Balance training;Neuromuscular re-education;Patient/family education;Orthotic Fit/Training;Cognitive remediation;Manual techniques;Passive range of motion;Energy conservation;Splinting;Taping;Vestibular;Visual/perceptual remediation/compensation       Patient will benefit from skilled therapeutic intervention in order to improve the following deficits and impairments:  Abnormal gait, Cardiopulmonary status limiting activity, Decreased activity tolerance, Decreased balance, Decreased knowledge of precautions, Decreased endurance, Decreased coordination, Decreased cognition, Decreased knowledge of use of DME, Decreased mobility, Decreased safety awareness, Difficulty walking, Decreased strength, Impaired flexibility, Impaired perceived functional ability, Impaired UE functional use, Impaired tone, Postural dysfunction, Improper body mechanics  Visit Diagnosis: Muscle weakness (generalized)  Difficulty walking     Problem List Patient Active Problem List   Diagnosis Date Noted  . Oropharyngeal dysphagia 06/21/2019  . Moderate protein-calorie malnutrition ([name]) 06/21/2019  . Balance disorder 06/21/2019  . Left homonymous hemianopsia 06/21/2019  . Pain due to onychomycosis of  toenails of both feet 01/03/2019  . Lewy body dementia without behavioral disturbance ([place]) 07/21/2017  . Elevated PSA 12/09/2016  . Vitamin D deficiency 12/09/2016  . Chronic pain of right knee 11/04/2016  . Hyperlipidemia  11/04/2016  . CKD (chronic kidney disease), stage III 11/04/2016  . Osteoarthritis of multiple joints 11/03/2016  . Chronic right hip pain 11/03/2016  . 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 Hypothyroidism 11/03/2014  . Asthma 11/03/2014  . Osteoporosis 11/03/2014  . Skin lesion of chest wall 11/03/2014  . Trigger finger of both hands 11/03/2014   Janna Arch, PT, DPT    08/03/2019, 2:44 PM  [place] MAIN [facility] SERVICES [address] [place], [place], [address] Phone: [phone]   Fax:  [phone]  Name: MATHIUS YAHOLA MRN: [id number] Date of Birth: June 10, 1940

## 2019-08-08 ENCOUNTER — Other Ambulatory Visit: Payer: Self-pay

## 2019-08-08 ENCOUNTER — Ambulatory Visit: Payer: Medicare Other

## 2019-08-08 DIAGNOSIS — M6281 Muscle weakness (generalized): Secondary | ICD-10-CM

## 2019-08-08 DIAGNOSIS — R262 Difficulty in walking, not elsewhere classified: Secondary | ICD-10-CM

## 2019-08-08 NOTE — Therapy (Signed)
[place] MAIN [facility] SERVICES [address] [place], [place], [address] Phone: [phone]   Fax:  [phone]  Physical Therapy Treatment Physical Therapy Progress Note   Dates of reporting period  07/06/19   to   08/08/19  Patient Details  Name: Blake Bates MRN: [id number] Date of Birth: 1941-01-25 No data recorded  Encounter Date: 08/08/2019  PT End of Session - 08/08/19 1354    Visit Number  10    Number of Visits  16    Date for PT Re-Evaluation  08/31/19    Authorization Type  next session 1/10 PN 08/08/19    Authorization - Number of Visits  10    Progress Note Due on Visit  10    PT Start Time  3710    PT Stop Time  1430    PT Time Calculation (min)  45 min    Equipment Utilized During Treatment  Gait belt;Other (comment)   FWW   Behavior During Therapy  WFL for tasks assessed/performed       Past Medical History:  Diagnosis Date  . Asthma   . Diverticulosis   . Full dentures   . Hyperlipidemia   . Hypothyroidism    s/p subtotal thyroidectomy  . Muscle spasms of lower extremity    Right upper leg  . Osteoporosis   . Rhinitis, allergic    pollens, mold, animal dander    Past Surgical History:  Procedure Laterality Date  . CATARACT EXTRACTION W/PHACO Left 09/20/2014   Procedure: CATARACT EXTRACTION PHACO AND INTRAOCULAR LENS PLACEMENT (IOC);  Surgeon: Leandrew Koyanagi, MD;  Location: [place];  Service: Ophthalmology;  Laterality: Left;  . COLONOSCOPY    . PARATHYROIDECTOMY    . THYROID SURGERY     subtotal    There were no vitals filed for this visit.  Subjective Assessment - 08/08/19 1355    Subjective  Patient and wife report he was able to walk to the mailbox a couple times over the weekend. One fall since last session when he bent down to pick things up outside  Has been intermittent compliance based on cognition    Patient is accompained by:  Family member    Pertinent History  Pt has Lewy  Body dementia and Parkinson's disease, and has difficulty walking due to weakness and balance deficits, as well as difficulty with motor planning and decision making.  Pt is a high fall risk and has fallen 5x in the past 2 weeks.    Limitations  Standing;Walking;House hold activities    How long can you sit comfortably?  unlimited    How long can you stand comfortably?  5 minutes    How long can you walk comfortably?  2-3 minutes, must hold onto things due to weakness and imbalance    Patient Stated Goals  "to walk better, and feel stronger"    Currently in Pain?  No/denies         Progress note  Goals: No falls for one week; one fall over the weekend, no falls since end of february until this weekend. Strength:   Right Left  Hip flexion 4/5 4/5  Hip Abduction 4-/5 4-/5  Hip Adduction 4-/5 4-/5  Knee Extension  4-/5 4-/5  Knee Flexion 4-/5 3+/5  DF 4-/5 4-/5  PF 4-/5 4-/5   FOTO 28/100 Risk adjusted FOTO: 52/100  TUG: 29.7 seconds 5x STS : 18.05 seconds with UE support : 26.07 seconds w/o UE support (  hold rainbow ball) one episode of confusion  Stairs with BUE step:   Ascend: BUE support reciprocating pattern.  Descending: BUE support, lateral side step with LLE externally rotated down stairs   Patient's condition has the potential to improve in response to therapy. Maximum improvement is yet to be obtained. The anticipated improvement is attainable and reasonable in a generally predictable time.  Patient and patient's wife report that he is able to walk better and is falling much less, with only one fall since starting PT. Is challenged by getting into/out of car, stairs, and turning left.                        PT Education - 08/08/19 1355    Education Details  goals, POC exercise technique    Person(s) Educated  Patient    Methods  Explanation;Demonstration;Tactile cues;Verbal cues    Comprehension  Verbalized understanding;Returned demonstration;Verbal  cues required;Tactile cues required       PT Short Term Goals - 08/08/19 1521      PT SHORT TERM GOAL #1   Title  Pt and caregiver will be educated in a new HEP for overall LE and UE strengthening, and kitchen sink balance exercises to decrease fall risk.    Baseline  No current exercises per caregiver 3/29: occasionally compliant with HEP    Time  1    Period  Weeks    Status  Partially Met    Target Date  08/22/19      PT SHORT TERM GOAL #2   Title  Pt and wife will be educated in proper use of assistive devices and safety techniques at home (increased lighting, removal of area rugs etc) to decrease fall risk    Baseline  Currently no education on home equipment and not using assistive devices. 3/29: challenged with compliance with use of AD    Time  1    Period  Weeks    Status  On-going    Target Date  07/13/19        PT Long Term Goals - 08/08/19 1521      PT LONG TERM GOAL #1   Title  Pt will have increased overall LE gross strength by at least 1/2-1 muscle grade throughout to decrease his fall risk and improve his ability to ambulate further distances safely.    Baseline  Pt currently at 2+ to 3+/5 grossly throughout UE's, and 3 to 4/5 grossly throughout LE's (see objective notes) 3/29: 4-/5 LE strength    Time  8    Period  Weeks    Status  Partially Met    Target Date  08/31/19      PT LONG TERM GOAL #2   Title  Pt's caregiver will report no greater than 0-1 falls over 1 week period due to improved balance and strength, and improved saftey techniques including assistive device.    Baseline  Pt currently falling about 5 times over 2-week period 3/29: 1 fall since starting therapy (happened in past week)    Time  8    Period  Weeks    Status  Partially Met    Target Date  08/31/19      PT LONG TERM GOAL #3   Title  Pt will have improved Timed Up and Go score to at least 1 minute or better, indicating decreased fall risk since initial PT evaluation.    Baseline  Pt  performed TUG in 1  minute and 30 seconds at initial evaluation. 3/29: 29.7 seconds    Time  8    Period  Weeks    Status  Achieved      PT LONG TERM GOAL #4   Title  Pt will have improved 5 Times Sit to Stand score to at least 20 seconds or better, indicating decreased fall risk since time of initial evaluation.    Baseline  Pt performed 5 Times Sit to Stand in 37 seconds at initial evaluation. 3/29; 26.07 seconds w/o UE support (holding onto rainbow ball)    Time  8    Period  Weeks    Status  Partially Met    Target Date  08/31/19      PT LONG TERM GOAL #5   Title  Pt will ascend/ descend 4 stairs safely with use of B handrails with SB/CGA to decrease fall risk.    Baseline  Pt currently trips of the 4 steps he uses to enter/exit his home per wife's report. 3/29: one step at a time with lateral steppage descending    Time  8    Period  Weeks    Status  Partially Met    Target Date  08/31/19            Plan - 08/08/19 1638    Clinical Impression Statement  Patient demonstrates excellent progression towards goals at this time. He has decreased his TUG time by over a minute and was able to perform a 5x STS without UE support. Stair negotiation is very challenging for him and he additionally scored a 28/100 on FOTO as there is no baseline score from evaluation. Patient's condition has the potential to improve in response to therapy. Maximum improvement is yet to be obtained. The anticipated improvement is attainable and reasonable in a generally predictable time. Patient will benefit from skilled physical therapy to increase balance, strength, and functional mobility for decreased fall risk    Personal Factors and Comorbidities  Age;Comorbidity 3+;Fitness;Past/Current Experience;Time since onset of injury/illness/exacerbation    Comorbidities  balance, left homonymous hemianopsia, CKD, HLD, Hypothyroidism, osteoprosis,    Examination-Activity Limitations  Bathing;Caring for  Others;Carry;Dressing;Locomotion Level;Lift;Reach Overhead;Squat;Stand;Transfers;Stairs    Examination-Participation Restrictions  Church;Cleaning;Community Activity;Driving;Interpersonal Relationship;Laundry;Volunteer;Shop;School;Meal Prep;Yard Work    [employer] officer  Evolving/Moderate complexity    Rehab Potential  Fair    PT Frequency  2x / week    PT Duration  8 weeks    PT Treatment/Interventions  ADLs/Self Care Home Management;Aquatic Therapy;Biofeedback;Cryotherapy;Electrical Stimulation;Iontophoresis 81m/ml Dexamethasone;Moist Heat;Traction;Ultrasound;Parrafin;Therapeutic exercise;Therapeutic activities;Functional mobility training;Stair training;Gait training;DME Instruction;Balance training;Neuromuscular re-education;Patient/family education;Orthotic Fit/Training;Cognitive remediation;Manual techniques;Passive range of motion;Energy conservation;Splinting;Taping;Vestibular;Visual/perceptual remediation/compensation       Patient will benefit from skilled therapeutic intervention in order to improve the following deficits and impairments:  Abnormal gait, Cardiopulmonary status limiting activity, Decreased activity tolerance, Decreased balance, Decreased knowledge of precautions, Decreased endurance, Decreased coordination, Decreased cognition, Decreased knowledge of use of DME, Decreased mobility, Decreased safety awareness, Difficulty walking, Decreased strength, Impaired flexibility, Impaired perceived functional ability, Impaired UE functional use, Impaired tone, Postural dysfunction, Improper body mechanics  Visit Diagnosis: Muscle weakness (generalized)  Difficulty walking     Problem List Patient Active Problem List   Diagnosis Date Noted  . Oropharyngeal dysphagia 06/21/2019  . Mod[place] protein[place]orie malnutrition ([place] 06/21/2019  . Balance disorder 06/21/2019  . Left homonymous hemianopsia 06/21/2019  . Pain due to onychomycosis of toenails of both  feet 01/03/2019  . Lewy body dementia without behavioral disturbance ([place] 07/21/2017  . Elevated  PSA 12/09/2016  . Vitamin D deficiency 12/09/2016  . Chronic pain of right knee 11/04/2016  . Hyperlipidemia 11/04/2016  . CKD (chronic kidney disease), stage III 11/04/2016  . Osteoarthritis of multiple joints 11/03/2016  . Chronic right hip pain 11/03/2016  . 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 Hypothyroidism 11/03/2014  . Asthma 11/03/2014  . Osteoporosis 11/03/2014  . Skin lesion of chest wall 11/03/2014  . Trigger finger of both hands 11/03/2014   Janna Arch, PT, DPT   08/08/2019, 4:38 PM  [place] MAIN [facility] SERVICES [address] [place], [place], [address] Phone: [phone]   Fax:  [phone]  Name: KOJI NIEHOFF MRN: [id number] Date of Birth: August 17, 1940

## 2019-08-10 ENCOUNTER — Ambulatory Visit: Payer: Medicare Other

## 2019-08-15 ENCOUNTER — Other Ambulatory Visit: Payer: Self-pay

## 2019-08-15 ENCOUNTER — Ambulatory Visit: Payer: Medicare Other | Attending: Neurology

## 2019-08-15 DIAGNOSIS — R262 Difficulty in walking, not elsewhere classified: Secondary | ICD-10-CM | POA: Diagnosis not present

## 2019-08-15 DIAGNOSIS — M6281 Muscle weakness (generalized): Secondary | ICD-10-CM

## 2019-08-15 NOTE — Therapy (Signed)
[place] MAIN [facility] SERVICES [address] [place], [place], [address] Phone: [phone]   Fax:  [phone]  Physical Therapy Treatment  Patient Details  Name: Blake Bates MRN: [id number] Date of Birth: 1941/05/04 No data recorded  Encounter Date: 08/15/2019  PT End of Session - 08/15/19 1607    Visit Number  11    Number of Visits  16    Date for PT Re-Evaluation  08/31/19    Authorization Type  PN 08/08/19    Authorization - Number of Visits  1    Progress Note Due on Visit  10    PT Start Time  1402    PT Stop Time  9326    PT Time Calculation (min)  43 min    Equipment Utilized During Treatment  Gait belt;Other (comment)   FWW   Behavior During Therapy  WFL for tasks assessed/performed       Past Medical History:  Diagnosis Date  . Asthma   . Diverticulosis   . Full dentures   . Hyperlipidemia   . Hypothyroidism    s/p subtotal thyroidectomy  . Muscle spasms of lower extremity    Right upper leg  . Osteoporosis   . Rhinitis, allergic    pollens, mold, animal dander    Past Surgical History:  Procedure Laterality Date  . CATARACT EXTRACTION W/PHACO Left 09/20/2014   Procedure: CATARACT EXTRACTION PHACO AND INTRAOCULAR LENS PLACEMENT (IOC);  Surgeon: Leandrew Koyanagi, MD;  Location: [place];  Service: Ophthalmology;  Laterality: Left;  . COLONOSCOPY    . PARATHYROIDECTOMY    . THYROID SURGERY     subtotal    There were no vitals filed for this visit.  Subjective Assessment - 08/15/19 1606    Subjective  Patient and wife report that he is doing well today. No falls since last therapy session. No changes in health/medication reported. No specific questions or concerns currently.    Patient is accompained by:  Family member    Pertinent History  Pt has Lewy Body dementia and Parkinson's disease, and has difficulty walking due to weakness and balance deficits, as well as difficulty with motor planning  and decision making.  Pt is a high fall risk and has fallen 5x in the past 2 weeks.    Limitations  Standing;Walking;House hold activities    How long can you sit comfortably?  unlimited    How long can you stand comfortably?  5 minutes    How long can you walk comfortably?  2-3 minutes, must hold onto things due to weakness and imbalance    Patient Stated Goals  "to walk better, and feel stronger"    Currently in Pain?  No/denies         TREATMENT   Therapeutic Exercise NuStep L0 x 5 minutes for warm-up during history from patient and wife (3 minutes unbilled); Attempted Precor leg press but pt unable to understand sequencing today; Sit to stand with 2# overhead med ball press x 10; Seated marches with3# ankle weight (AW) x 15bilateral; Seated LAQ with3# AW x 15 bilateral; Seated clams withmanual resistance x15bilateral; Seated adductorisometric squeeze withball, 3s holdx 15bilateral; Standing hip flexion marching with 3# AW x 15 bilateral; Attempted standing heel raises however pt is unable to understand sequencing; Standing mini squat with bilateral hand held support x 10;   Neuromuscular Re-education Forward and backward ambulation in // bars without UE support x 2 lengths each; Side stepping in //  bars without UE support x 2 lengths each; Airex WBOS no UE support x 30s; Airex WBOS ball passes around body x multiple bouts each direction, significant encouragement required for patient to hold ball with both hands; Airex WBOS overhead ball lifts with encouragement provided for patient to follow ball with head/eyes as well as improved upright posture x 10; Ambulate 100 ft with CGA and cueing for use of rollator. Education on use and safety with rollator. Occasional Min A for use of rollator to avoid bumping into obstacles.    Patient demonstrates left hemi neglect throughout session with frequent tactile, verbal, and visual cueing required for proper body  mechanics and task orientation. He frequently requires redirection and struggles to perform the leg press today so exercise discontinued. Pt appears much more cognitively impaired compared to when this therapist saw patient previously. Patient and wife encouraged to continue HEP. Patient will benefit from skilled physical therapy to increase balance, strength, and functional mobility for decreased fall risk.     Patient requires frequent task orientation and sequencing cues for memory. Use of visual demonstration in combination to verbal cues results in best response. Frequent cues are required for L visual field due to L hemi-neglect.  Pt educated throughout session about proper posture and technique with exercises. Improved exercise technique, movement at target joints, use of target muscles after min to mod verbal, visual, tactile cues.    Patient demonstratesleft hemi neglect throughout session with frequent tactile, verbal, and visual cueing required for proper body mechanics and task orientation.He is able to complete all exercises but requiring heavy cues and performance is somewhat limited                    PT Short Term Goals - 08/08/19 1521      PT SHORT TERM GOAL #1   Title  Pt and caregiver will be educated in a new HEP for overall LE and UE strengthening, and kitchen sink balance exercises to decrease fall risk.    Baseline  No current exercises per caregiver 3/29: occasionally compliant with HEP    Time  1    Period  Weeks    Status  Partially Met    Target Date  08/22/19      PT SHORT TERM GOAL #2   Title  Pt and wife will be educated in proper use of assistive devices and safety techniques at home (increased lighting, removal of area rugs etc) to decrease fall risk    Baseline  Currently no education on home equipment and not using assistive devices. 3/29: challenged with compliance with use of AD    Time  1    Period  Weeks    Status   On-going    Target Date  07/13/19        PT Long Term Goals - 08/08/19 1521      PT LONG TERM GOAL #1   Title  Pt will have increased overall LE gross strength by at least 1/2-1 muscle grade throughout to decrease his fall risk and improve his ability to ambulate further distances safely.    Baseline  Pt currently at 2+ to 3+/5 grossly throughout UE's, and 3 to 4/5 grossly throughout LE's (see objective notes) 3/29: 4-/5 LE strength    Time  8    Period  Weeks    Status  Partially Met    Target Date  08/31/19      PT LONG TERM GOAL #2  Title  Pt's caregiver will report no greater than 0-1 falls over 1 week period due to improved balance and strength, and improved saftey techniques including assistive device.    Baseline  Pt currently falling about 5 times over 2-week period 3/29: 1 fall since starting therapy (happened in past week)    Time  8    Period  Weeks    Status  Partially Met    Target Date  08/31/19      PT LONG TERM GOAL #3   Title  Pt will have improved Timed Up and Go score to at least 1 minute or better, indicating decreased fall risk since initial PT evaluation.    Baseline  Pt performed TUG in 1 minute and 30 seconds at initial evaluation. 3/29: 29.7 seconds    Time  8    Period  Weeks    Status  Achieved      PT LONG TERM GOAL #4   Title  Pt will have improved 5 Times Sit to Stand score to at least 20 seconds or better, indicating decreased fall risk since time of initial evaluation.    Baseline  Pt performed 5 Times Sit to Stand in 37 seconds at initial evaluation. 3/29; 26.07 seconds w/o UE support (holding onto rainbow ball)    Time  8    Period  Weeks    Status  Partially Met    Target Date  08/31/19      PT LONG TERM GOAL #5   Title  Pt will ascend/ descend 4 stairs safely with use of B handrails with SB/CGA to decrease fall risk.    Baseline  Pt currently trips of the 4 steps he uses to enter/exit his home per wife's report. 3/29: one step at a time  with lateral steppage descending    Time  8    Period  Weeks    Status  Partially Met    Target Date  08/31/19            Plan - 08/15/19 1607    Clinical Impression Statement  Patient demonstrates left hemi neglect throughout session with frequent tactile, verbal, and visual cueing required for proper body mechanics and task orientation. He frequently requires redirection and struggles to perform the leg press today so exercise discontinued. Pt appears much more cognitively impaired compared to when this therapist saw patient previously. Patient and wife encouraged to continue HEP. Patient will benefit from skilled physical therapy to increase balance, strength, and functional mobility for decreased fall risk.    Personal Factors and Comorbidities  Age;Comorbidity 3+;Fitness;Past/Current Experience;Time since onset of injury/illness/exacerbation    Comorbidities  balance, left homonymous hemianopsia, CKD, HLD, Hypothyroidism, osteoprosis,    Examination-Activity Limitations  Bathing;Caring for Others;Carry;Dressing;Locomotion Level;Lift;Reach Overhead;Squat;Stand;Transfers;Stairs    Examination-Participation Restrictions  Church;Cleaning;Community Activity;Driving;Interpersonal Relationship;Laundry;Volunteer;Shop;School;Meal Prep;Yard Work    [employer]  Evolving/Moderate complexity    Rehab Potential  Fair    PT Frequency  2x / week    PT Duration  8 weeks    PT Treatment/Interventions  ADLs/Self Care Home Management;Aquatic Therapy;Biofeedback;Cryotherapy;Electrical Stimulation;Iontophoresis 67m/ml Dexamethasone;Moist Heat;Traction;Ultrasound;Parrafin;Therapeutic exercise;Therapeutic activities;Functional mobility training;Stair training;Gait training;DME Instruction;Balance training;Neuromuscular re-education;Patient/family education;Orthotic Fit/Training;Cognitive remediation;Manual techniques;Passive range of motion;Energy  conservation;Splinting;Taping;Vestibular;Visual/perceptual remediation/compensation       Patient will benefit from skilled therapeutic intervention in order to improve the following deficits and impairments:  Abnormal gait, Cardiopulmonary status limiting activity, Decreased activity tolerance, Decreased balance, Decreased knowledge of precautions, Decreased endurance, Decreased coordination, Decreased cognition, Decreased knowledge of  use of DME, Decreased mobility, Decreased safety awareness, Difficulty walking, Decreased strength, Impaired flexibility, Impaired perceived functional ability, Impaired UE functional use, Impaired tone, Postural dysfunction, Improper body mechanics  Visit Diagnosis: Muscle weakness (generalized)  Difficulty walking     Problem List Patient Active Problem List   Diagnosis Date Noted  . Oropharyngeal dysphagia 06/21/2019  . Moderate protein-calorie malnutrition ([place]) 06/21/2019  . Balance disorder 06/21/2019  . Left homonymous hemianopsia 06/21/2019  . Pain due to onychomycosis of toenails of both feet 01/03/2019  . Lewy body dementia without behavioral disturbance ([place]) 07/21/2017  . Elevated PSA 12/09/2016  . Vitamin D deficiency 12/09/2016  . Chronic pain of right knee 11/04/2016  . Hyperlipidemia 11/04/2016  . CKD (chronic kidney disease), stage III 11/04/2016  . Osteoarthritis of multiple joints 11/03/2016  . Chronic right hip pain 11/03/2016  . 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 Hypothyroidism 11/03/2014  . Asthma 11/03/2014  . Osteoporosis 11/03/2014  . Skin lesion of chest wall 11/03/2014  . Trigger finger of both hands 11/03/2014   Phillips Grout PT, DPT, GCS  Mamye Bolds 08/16/2019, 12:50 PM  [place] MAIN [facility] SERVICES [address] [place], [place], [address] Phone: [phone]   Fax:  [phone]  Name: Blake Bates MRN: [id number] Date of Birth:  1940/09/07

## 2019-08-17 ENCOUNTER — Other Ambulatory Visit: Payer: Self-pay

## 2019-08-17 ENCOUNTER — Ambulatory Visit: Payer: Medicare Other

## 2019-08-17 DIAGNOSIS — M6281 Muscle weakness (generalized): Secondary | ICD-10-CM | POA: Diagnosis not present

## 2019-08-17 DIAGNOSIS — R262 Difficulty in walking, not elsewhere classified: Secondary | ICD-10-CM | POA: Diagnosis not present

## 2019-08-17 NOTE — Therapy (Signed)
[place] MAIN [facility] SERVICES [address] [place], [place], [address] Phone: [phone]   Fax:  [phone]  Physical Therapy Treatment  Patient Details  Name: Blake Bates MRN: [id number] Date of Birth: Nov 08, 1940 No data recorded  Encounter Date: 08/17/2019  PT End of Session - 08/17/19 1609    Visit Number  12    Number of Visits  16    Date for PT Re-Evaluation  08/31/19    Authorization Type  PN 08/08/19    Authorization - Number of Visits  2    Progress Note Due on Visit  10    PT Start Time  1401    PT Stop Time  1445    PT Time Calculation (min)  44 min    Equipment Utilized During Treatment  Gait belt;Other (comment)   FWW   Behavior During Therapy  WFL for tasks assessed/performed       Past Medical History:  Diagnosis Date  . Asthma   . Diverticulosis   . Full dentures   . Hyperlipidemia   . Hypothyroidism    s/p subtotal thyroidectomy  . Muscle spasms of lower extremity    Right upper leg  . Osteoporosis   . Rhinitis, allergic    pollens, mold, animal dander    Past Surgical History:  Procedure Laterality Date  . CATARACT EXTRACTION W/PHACO Left 09/20/2014   Procedure: CATARACT EXTRACTION PHACO AND INTRAOCULAR LENS PLACEMENT (IOC);  Surgeon: Leandrew Koyanagi, MD;  Location: [place];  Service: Ophthalmology;  Laterality: Left;  . COLONOSCOPY    . PARATHYROIDECTOMY    . THYROID SURGERY     subtotal    There were no vitals filed for this visit.  Subjective Assessment - 08/17/19 1608    Subjective  Patient reports no falls or LOB since last session. Has been doing short walks.    Patient is accompained by:  Family member    Pertinent History  Pt has Lewy Body dementia and Parkinson's disease, and has difficulty walking due to weakness and balance deficits, as well as difficulty with motor planning and decision making.  Pt is a high fall risk and has fallen 5x in the past 2 weeks.     Limitations  Standing;Walking;House hold activities    How long can you sit comfortably?  unlimited    How long can you stand comfortably?  5 minutes    How long can you walk comfortably?  2-3 minutes, must hold onto things due to weakness and imbalance    Patient Stated Goals  "to walk better, and feel stronger"    Currently in Pain?  No/denies             Nustep Lvl 1 4 minutes RPM> 30-40rpm for cardiovascular challenge    Neuromuscular Re-education cues for safety awareness, CGA for all standing interventions.  airex pad: rainbow ball bilateral arm raise 10, improved upright posture with repetition  airex pad: rainbow ball press 10x, very challenging; requires use of wife for visual cue to look up airex pad: [place] ball transfer with LUE support x3 minutes, terminated due to patient confusion and unsafe  Step over orange hurdle BUE support 10x each LE, max cueing for sequencing  High knee forward marches 6x length of // bars   TherEx cues for body mechanics, sequencing and task orientation  Sit to stand without UE support/ocasional SUE support x 10 from seat  6" step toe taps 15x each LE BUE  support 6" step lateral and cross body taps 15x each LE, BUE support          Patient requires frequent task orientation and sequencing cues for memory. Use of visual demonstration in combination to verbal cues results in best response. Frequent cues are required for L visual field due to L hemi-neglect.     Pt educated throughout session about proper posture and technique with exercises. Improved exercise technique, movement at target joints, use of target muscles after min to mod verbal, visual, tactile cues.       Patient demonstrates left hemi neglect throughout session with frequent tactile, verbal, and visual cueing required for proper body mechanics and task orientation. He is able to complete all exercises but requiring heavy cues and performance is somewhat  limited                   PT Education - 08/17/19 1608    Education Details  exercise technique, body mechanics    Person(s) Educated  Patient    Methods  Explanation;Demonstration;Tactile cues;Verbal cues    Comprehension  Verbalized understanding;Returned demonstration;Tactile cues required;Verbal cues required       PT Short Term Goals - 08/08/19 1521      PT SHORT TERM GOAL #1   Title  Pt and caregiver will be educated in a new HEP for overall LE and UE strengthening, and kitchen sink balance exercises to decrease fall risk.    Baseline  No current exercises per caregiver 3/29: occasionally compliant with HEP    Time  1    Period  Weeks    Status  Partially Met    Target Date  08/22/19      PT SHORT TERM GOAL #2   Title  Pt and wife will be educated in proper use of assistive devices and safety techniques at home (increased lighting, removal of area rugs etc) to decrease fall risk    Baseline  Currently no education on home equipment and not using assistive devices. 3/29: challenged with compliance with use of AD    Time  1    Period  Weeks    Status  On-going    Target Date  07/13/19        PT Long Term Goals - 08/08/19 1521      PT LONG TERM GOAL #1   Title  Pt will have increased overall LE gross strength by at least 1/2-1 muscle grade throughout to decrease his fall risk and improve his ability to ambulate further distances safely.    Baseline  Pt currently at 2+ to 3+/5 grossly throughout UE's, and 3 to 4/5 grossly throughout LE's (see objective notes) 3/29: 4-/5 LE strength    Time  8    Period  Weeks    Status  Partially Met    Target Date  08/31/19      PT LONG TERM GOAL #2   Title  Pt's caregiver will report no greater than 0-1 falls over 1 week period due to improved balance and strength, and improved saftey techniques including assistive device.    Baseline  Pt currently falling about 5 times over 2-week period 3/29: 1 fall since starting  therapy (happened in past week)    Time  8    Period  Weeks    Status  Partially Met    Target Date  08/31/19      PT LONG TERM GOAL #3   Title  Pt will have improved  Timed Up and Go score to at least 1 minute or better, indicating decreased fall risk since initial PT evaluation.    Baseline  Pt performed TUG in 1 minute and 30 seconds at initial evaluation. 3/29: 29.7 seconds    Time  8    Period  Weeks    Status  Achieved      PT LONG TERM GOAL #4   Title  Pt will have improved 5 Times Sit to Stand score to at least 20 seconds or better, indicating decreased fall risk since time of initial evaluation.    Baseline  Pt performed 5 Times Sit to Stand in 37 seconds at initial evaluation. 3/29; 26.07 seconds w/o UE support (holding onto rainbow ball)    Time  8    Period  Weeks    Status  Partially Met    Target Date  08/31/19      PT LONG TERM GOAL #5   Title  Pt will ascend/ descend 4 stairs safely with use of B handrails with SB/CGA to decrease fall risk.    Baseline  Pt currently trips of the 4 steps he uses to enter/exit his home per wife's report. 3/29: one step at a time with lateral steppage descending    Time  8    Period  Weeks    Status  Partially Met    Target Date  08/31/19              Patient will benefit from skilled therapeutic intervention in order to improve the following deficits and impairments:     Visit Diagnosis: No diagnosis found.     Problem List Patient Active Problem List   Diagnosis Date Noted  . Oropharyngeal dysphagia 06/21/2019  . Moderate protein-calorie malnutrition ([place]) 06/21/2019  . Balance disorder 06/21/2019  . Left homonymous hemianopsia 06/21/2019  . Pain due to onychomycosis of toenails of both feet 01/03/2019  . Lewy body dementia without behavioral disturbance ([place]) 07/21/2017  . Elevated PSA 12/09/2016  . Vitamin D deficiency 12/09/2016  . Chronic pain of right knee 11/04/2016  . Hyperlipidemia 11/04/2016  . CKD  (chronic kidney disease), stage III 11/04/2016  . Osteoarthritis of multiple joints 11/03/2016  . Chronic right hip pain 11/03/2016  . 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 Hypothyroidism 11/03/2014  . Asthma 11/03/2014  . Osteoporosis 11/03/2014  . Skin lesion of chest wall 11/03/2014  . Trigger finger of both hands 11/03/2014   Janna Arch, PT, DPT   08/17/2019, 4:09 PM  [place] MAIN [facility] SERVICES [address] [place], [place], [address] Phone: [phone]   Fax:  [phone]  Name: Blake Bates MRN: [id number] Date of Birth: Aug 05, 1940

## 2019-08-23 ENCOUNTER — Ambulatory Visit: Payer: Medicare Other

## 2019-08-23 ENCOUNTER — Other Ambulatory Visit: Payer: Self-pay

## 2019-08-23 DIAGNOSIS — R262 Difficulty in walking, not elsewhere classified: Secondary | ICD-10-CM

## 2019-08-23 DIAGNOSIS — M6281 Muscle weakness (generalized): Secondary | ICD-10-CM | POA: Diagnosis not present

## 2019-08-23 NOTE — Therapy (Signed)
[place] MAIN [facility] SERVICES [address] [place], [place], [address] Phone: [phone]   Fax:  [phone]  Physical Therapy Treatment  Patient Details  Name: Blake Bates MRN: [id number] Date of Birth: 1941-01-13 No data recorded  Encounter Date: 08/23/2019  PT End of Session - 08/23/19 1322    Visit Number  13    Number of Visits  16    Date for PT Re-Evaluation  08/31/19    Authorization Type  PN 08/08/19    Authorization - Number of Visits  3    Progress Note Due on Visit  10    PT Start Time  1430    PT Stop Time  9390    PT Time Calculation (min)  44 min    Equipment Utilized During Treatment  Gait belt;Other (comment)   FWW   Behavior During Therapy  WFL for tasks assessed/performed       Past Medical History:  Diagnosis Date  . Asthma   . Diverticulosis   . Full dentures   . Hyperlipidemia   . Hypothyroidism    s/p subtotal thyroidectomy  . Muscle spasms of lower extremity    Right upper leg  . Osteoporosis   . Rhinitis, allergic    pollens, mold, animal dander    Past Surgical History:  Procedure Laterality Date  . CATARACT EXTRACTION W/PHACO Left 09/20/2014   Procedure: CATARACT EXTRACTION PHACO AND INTRAOCULAR LENS PLACEMENT (IOC);  Surgeon: Leandrew Koyanagi, MD;  Location: [facility];  Service: Ophthalmology;  Laterality: Left;  . COLONOSCOPY    . PARATHYROIDECTOMY    . THYROID SURGERY     subtotal    There were no vitals filed for this visit.  Subjective Assessment - 08/23/19 1335    Subjective  Patient is going to see physician in two weeks. Has been having a decline in cognition. No falls over the weekend. Has been going out to get the paper, used the walker despite not wanting to.    Patient is accompained by:  Family member    Pertinent History  Pt has Lewy Body dementia and Parkinson's disease, and has difficulty walking due to weakness and balance deficits, as well as difficulty  with motor planning and decision making.  Pt is a high fall risk and has fallen 5x in the past 2 weeks.    Limitations  Standing;Walking;House hold activities    How long can you sit comfortably?  unlimited    How long can you stand comfortably?  5 minutes    How long can you walk comfortably?  2-3 minutes, must hold onto things due to weakness and imbalance    Patient Stated Goals  "to walk better, and feel stronger"    Currently in Pain?  No/denies          Nustep Lvl 1 4 minutes RPM> 30-40rpmfor cardiovascular challenge  Neuromuscular Re-educationcues for safety awareness, CGA for all standing interventions. Holding onto rail 6" step toe taps 12x each LE ; needs cue of sticky note for where to place feet  step over and back orange hurdle one LE at a time, 10x, max cueing for sequencing with visual cue for mechanics   TherEx cues for body mechanics, sequencing and task orientation   Ambulate 140 ft with CGA and cueing for use of rollator. Education on use and safety with rollator. Increased gait speed noted with occasional Min A for use of rollator.    Sit to stand  without UE support x 11fom seat  2.5 ankle weight,  -abduction BUE support to sticky note 10x each LE -hip extension BUE support to sticky note 10x each LE 2.5 ankle weight seated LAQ 10x each LE 2.5 ankle weight seated mraching 10x each LE   seated balloon taps reaching inside/outside BOS, able to reach with LUE with increased repetition. x3 minutes  Seated ball passes 25x, challenging for patient to catch, limited left arm strength with passing resulting in more diagonal movement compared to previous attempts.   Patient requires frequent task orientation and sequencing cues for memory. Use of visual demonstration in combination to verbal cues results in best response. Frequent cues are required for L visual field due to L hemi-neglect.  Pt educated throughout session about proper posture and  technique with exercises. Improved exercise technique, movement at target joints, use of target muscles after min to mod verbal, visual, tactile cues.    Patient demonstratesleft hemi neglect throughout session with frequent tactile, verbal, and visual cueing required for proper body mechanics and task orientation.He is able to complete all exercises but requiring heavy cues and performance is somewhat limited                         PT Education - 08/23/19 1322    Education Details  exercise technique, body mechanics    Person(s) Educated  Patient    Methods  Explanation;Demonstration;Tactile cues;Verbal cues    Comprehension  Verbalized understanding;Returned demonstration;Verbal cues required;Tactile cues required       PT Short Term Goals - 08/08/19 1521      PT SHORT TERM GOAL #1   Title  Pt and caregiver will be educated in a new HEP for overall LE and UE strengthening, and kitchen sink balance exercises to decrease fall risk.    Baseline  No current exercises per caregiver 3/29: occasionally compliant with HEP    Time  1    Period  Weeks    Status  Partially Met    Target Date  08/22/19      PT SHORT TERM GOAL #2   Title  Pt and wife will be educated in proper use of assistive devices and safety techniques at home (increased lighting, removal of area rugs etc) to decrease fall risk    Baseline  Currently no education on home equipment and not using assistive devices. 3/29: challenged with compliance with use of AD    Time  1    Period  Weeks    Status  On-going    Target Date  07/13/19        PT Long Term Goals - 08/08/19 1521      PT LONG TERM GOAL #1   Title  Pt will have increased overall LE gross strength by at least 1/2-1 muscle grade throughout to decrease his fall risk and improve his ability to ambulate further distances safely.    Baseline  Pt currently at 2+ to 3+/5 grossly throughout UE's, and 3 to 4/5 grossly throughout LE's (see  objective notes) 3/29: 4-/5 LE strength    Time  8    Period  Weeks    Status  Partially Met    Target Date  08/31/19      PT LONG TERM GOAL #2   Title  Pt's caregiver will report no greater than 0-1 falls over 1 week period due to improved balance and strength, and improved saftey techniques including assistive device.  Baseline  Pt currently falling about 5 times over 2-week period 3/29: 1 fall since starting therapy (happened in past week)    Time  8    Period  Weeks    Status  Partially Met    Target Date  08/31/19      PT LONG TERM GOAL #3   Title  Pt will have improved Timed Up and Go score to at least 1 minute or better, indicating decreased fall risk since initial PT evaluation.    Baseline  Pt performed TUG in 1 minute and 30 seconds at initial evaluation. 3/29: 29.7 seconds    Time  8    Period  Weeks    Status  Achieved      PT LONG TERM GOAL #4   Title  Pt will have improved 5 Times Sit to Stand score to at least 20 seconds or better, indicating decreased fall risk since time of initial evaluation.    Baseline  Pt performed 5 Times Sit to Stand in 37 seconds at initial evaluation. 3/29; 26.07 seconds w/o UE support (holding onto rainbow ball)    Time  8    Period  Weeks    Status  Partially Met    Target Date  08/31/19      PT LONG TERM GOAL #5   Title  Pt will ascend/ descend 4 stairs safely with use of B handrails with SB/CGA to decrease fall risk.    Baseline  Pt currently trips of the 4 steps he uses to enter/exit his home per wife's report. 3/29: one step at a time with lateral steppage descending    Time  8    Period  Weeks    Status  Partially Met    Target Date  08/31/19            Plan - 08/24/19 6144    Clinical Impression Statement  Patient continues to demonstrate cognitive decline, wife is aware and they are waiting for physician visit in two weeks. He requires increased cueing with use of visual cue for placement as well as maximal cueing for  task orientation and continuation. He does better with automatic tasks that require less motor skill planning.   Patient will benefit from skilled physical therapy to increase balance, strength, and functional mobility for decreased fall risk.    Personal Factors and Comorbidities  Age;Comorbidity 3+;Fitness;Past/Current Experience;Time since onset of injury/illness/exacerbation    Comorbidities  balance, left homonymous hemianopsia, CKD, HLD, Hypothyroidism, osteoprosis,    Examination-Activity Limitations  Bathing;Caring for Others;Carry;Dressing;Locomotion Level;Lift;Reach Overhead;Squat;Stand;Transfers;Stairs    Examination-Participation Restrictions  Church;Cleaning;Community Activity;Driving;Interpersonal Relationship;Laundry;Volunteer;Shop;School;Meal Prep;Yard Work    [employer] officer  Evolving/Moderate complexity    Rehab Potential  Fair    PT Frequency  2x / week    PT Duration  8 weeks    PT Treatment/Interventions  ADLs/Self Care Home Management;Aquatic Therapy;Biofeedback;Cryotherapy;Electrical Stimulation;Iontophoresis 19m/ml Dexamethasone;Moist Heat;Traction;Ultrasound;Parrafin;Therapeutic exercise;Therapeutic activities;Functional mobility training;Stair training;Gait training;DME Instruction;Balance training;Neuromuscular re-education;Patient/family education;Orthotic Fit/Training;Cognitive remediation;Manual techniques;Passive range of motion;Energy conservation;Splinting;Taping;Vestibular;Visual/perceptual remediation/compensation       Patient will benefit from skilled therapeutic intervention in order to improve the following deficits and impairments:  Abnormal gait, Cardiopulmonary status limiting activity, Decreased activity tolerance, Decreased balance, Decreased knowledge of precautions, Decreased endurance, Decreased coordination, Decreased cognition, Decreased knowledge of use of DME, Decreased mobility, Decreased safety awareness, Difficulty walking,  Decreased strength, Impaired flexibility, Impaired perceived functional ability, Impaired UE functional use, Impaired tone, Postural dysfunction, Improper body mechanics  Visit Diagnosis: Muscle weakness (generalized)  Difficulty walking     Problem List Patient Active Problem List   Diagnosis Date Noted  . Oropharyngeal dysphagia 06/21/2019  . Moderate protein-calorie malnutrition ([place]) 06/21/2019  . Balance disorder 06/21/2019  . Left homonymous hemianopsia 06/21/2019  . Pain due to onychomycosis of toenails of both feet 01/03/2019  . Lewy body dementia without behavioral disturbance ([place]) 07/21/2017  . Elevated PSA 12/09/2016  . Vitamin D deficiency 12/09/2016  . Chronic pain of right knee 11/04/2016  . Hyperlipidemia 11/04/2016  . CKD (chronic kidney disease), stage III 11/04/2016  . Osteoarthritis of multiple joints 11/03/2016  . Chronic right hip pain 11/03/2016  . 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 Hypothyroidism 11/03/2014  . Asthma 11/03/2014  . Osteoporosis 11/03/2014  . Skin lesion of chest wall 11/03/2014  . Trigger finger of both hands 11/03/2014   Janna Arch, PT, DPT   08/24/2019, 8:43 AM  [place] MAIN [facility] SERVICES [address] [place], [place], [address] Phone: [phone]   Fax:  [phone]  Name: BRICESON BROADWATER MRN: [id number] Date of Birth: [birth year]

## 2019-08-25 ENCOUNTER — Ambulatory Visit: Payer: Medicare Other

## 2019-08-25 ENCOUNTER — Other Ambulatory Visit: Payer: Self-pay

## 2019-08-25 DIAGNOSIS — M6281 Muscle weakness (generalized): Secondary | ICD-10-CM

## 2019-08-25 DIAGNOSIS — R262 Difficulty in walking, not elsewhere classified: Secondary | ICD-10-CM

## 2019-08-25 NOTE — Therapy (Signed)
[place] MAIN [facility] SERVICES [address] [place], [place], [address] Phone: [phone]   Fax:  [phone]  Physical Therapy Treatment  Patient Details  Name: Blake Bates MRN: [id number] Date of Birth: 1941/01/17 No data recorded  Encounter Date: 08/25/2019  PT End of Session - 08/25/19 1430    Visit Number  14    Number of Visits  16    Date for PT Re-Evaluation  08/31/19    Authorization Type  PN 08/08/19    Authorization - Number of Visits  4    Progress Note Due on Visit  10    PT Start Time  6962    PT Stop Time  1508    PT Time Calculation (min)  48 min    Equipment Utilized During Treatment  Gait belt;Other (comment)   FWW   Behavior During Therapy  WFL for tasks assessed/performed       Past Medical History:  Diagnosis Date  . Asthma   . Diverticulosis   . Full dentures   . Hyperlipidemia   . Hypothyroidism    s/p subtotal thyroidectomy  . Muscle spasms of lower extremity    Right upper leg  . Osteoporosis   . Rhinitis, allergic    pollens, mold, animal dander    Past Surgical History:  Procedure Laterality Date  . CATARACT EXTRACTION W/PHACO Left 09/20/2014   Procedure: CATARACT EXTRACTION PHACO AND INTRAOCULAR LENS PLACEMENT (IOC);  Surgeon: Leandrew Koyanagi, MD;  Location: [facility];  Service: Ophthalmology;  Laterality: Left;  . COLONOSCOPY    . PARATHYROIDECTOMY    . THYROID SURGERY     subtotal    There were no vitals filed for this visit.  Subjective Assessment - 08/25/19 1428    Subjective  Patient and wife report no falls or LOB since last session. Went and got the paper yesterday with no falls, was able to use the walker.    Patient is accompained by:  Family member    Pertinent History  Pt has Lewy Body dementia and Parkinson's disease, and has difficulty walking due to weakness and balance deficits, as well as difficulty with motor planning and decision making.  Pt is a high fall  risk and has fallen 5x in the past 2 weeks.    Limitations  Standing;Walking;House hold activities    How long can you sit comfortably?  unlimited    How long can you stand comfortably?  5 minutes    How long can you walk comfortably?  2-3 minutes, must hold onto things due to weakness and imbalance    Patient Stated Goals  "to walk better, and feel stronger"    Currently in Pain?  No/denies              Nustep Lvl 1 4 minutes RPM> 30-40rpm for cardiovascular challenge    Neuromuscular Re-education cues for safety awareness, CGA for all standing interventions.  Holding onto rail 6" step step up, down, then repeat on 4" step up and down, improved ability to step down with decreased need for cueing with repetition x 10 lengths of // bars.   Speed ladder: one foot each box for increased step length, visual cue for foot placement, and upright posture x 8 lengths of // bars, very very challenging for patient initially; improved with repetition  Speed ladder: side step 4x length of // bars with cues for foot placement for two feet in each step. Challenging for patient  to turn to the left  airex pad: toss basketballs into hoop, able to maintain stability with SUE support, improved upright posture with repetition. x12   TherEx cues for body mechanics, sequencing and task orientation      Sit to stand without UE support x 10 from seat   Seated cross body punches to PT holding mitts, mitts held on outer edges of reach with patient responding well with improved ability to use LUE with repetition   Patient requires frequent task orientation and sequencing cues for memory. Use of visual demonstration in combination to verbal cues results in best response. Frequent cues are required for L visual field due to L hemi-neglect.     Pt educated throughout session about proper posture and technique with exercises. Improved exercise technique, movement at target joints, use of target muscles after min  to mod verbal, visual, tactile cues.       Patient demonstrates left hemi neglect throughout session with frequent tactile, verbal, and visual cueing required for proper body mechanics and task orientation. He is able to complete all exercises but requiring heavy cues and performance is somewhat limited    Patient continues to be cognitively impaired throughout session however does have improved ability to perform tasks that are familiar to him. Use of visual with verbal cueing is most successful forms of cueing. Patient will benefit from skilled physical therapy to increase balance, strength, and functional mobility for decreased fall risk.                 PT Education - 08/25/19 1430    Education Details  exercise technique, body mechanics    Person(s) Educated  Patient    Methods  Explanation;Demonstration;Tactile cues;Verbal cues    Comprehension  Verbalized understanding;Returned demonstration;Verbal cues required;Tactile cues required       PT Short Term Goals - 08/08/19 1521      PT SHORT TERM GOAL #1   Title  Pt and caregiver will be educated in a new HEP for overall LE and UE strengthening, and kitchen sink balance exercises to decrease fall risk.    Baseline  No current exercises per caregiver 3/29: occasionally compliant with HEP    Time  1    Period  Weeks    Status  Partially Met    Target Date  08/22/19      PT SHORT TERM GOAL #2   Title  Pt and wife will be educated in proper use of assistive devices and safety techniques at home (increased lighting, removal of area rugs etc) to decrease fall risk    Baseline  Currently no education on home equipment and not using assistive devices. 3/29: challenged with compliance with use of AD    Time  1    Period  Weeks    Status  On-going    Target Date  07/13/19        PT Long Term Goals - 08/08/19 1521      PT LONG TERM GOAL #1   Title  Pt will have increased overall LE gross strength by at least 1/2-1  muscle grade throughout to decrease his fall risk and improve his ability to ambulate further distances safely.    Baseline  Pt currently at 2+ to 3+/5 grossly throughout UE's, and 3 to 4/5 grossly throughout LE's (see objective notes) 3/29: 4-/5 LE strength    Time  8    Period  Weeks    Status  Partially Met  Target Date  08/31/19      PT LONG TERM GOAL #2   Title  Pt's caregiver will report no greater than 0-1 falls over 1 week period due to improved balance and strength, and improved saftey techniques including assistive device.    Baseline  Pt currently falling about 5 times over 2-week period 3/29: 1 fall since starting therapy (happened in past week)    Time  8    Period  Weeks    Status  Partially Met    Target Date  08/31/19      PT LONG TERM GOAL #3   Title  Pt will have improved Timed Up and Go score to at least 1 minute or better, indicating decreased fall risk since initial PT evaluation.    Baseline  Pt performed TUG in 1 minute and 30 seconds at initial evaluation. 3/29: 29.7 seconds    Time  8    Period  Weeks    Status  Achieved      PT LONG TERM GOAL #4   Title  Pt will have improved 5 Times Sit to Stand score to at least 20 seconds or better, indicating decreased fall risk since time of initial evaluation.    Baseline  Pt performed 5 Times Sit to Stand in 37 seconds at initial evaluation. 3/29; 26.07 seconds w/o UE support (holding onto rainbow ball)    Time  8    Period  Weeks    Status  Partially Met    Target Date  08/31/19      PT LONG TERM GOAL #5   Title  Pt will ascend/ descend 4 stairs safely with use of B handrails with SB/CGA to decrease fall risk.    Baseline  Pt currently trips of the 4 steps he uses to enter/exit his home per wife's report. 3/29: one step at a time with lateral steppage descending    Time  8    Period  Weeks    Status  Partially Met    Target Date  08/31/19            Plan - 08/25/19 1723    Clinical Impression Statement   Patient continues to be cognitively impaired throughout session however does have improved ability to perform tasks that are familiar to him. Use of visual with verbal cueing is most successful forms of cueing. Patient will benefit from skilled physical therapy to increase balance, strength, and functional mobility for decreased fall risk.    Personal Factors and Comorbidities  Age;Comorbidity 3+;Fitness;Past/Current Experience;Time since onset of injury/illness/exacerbation    Comorbidities  balance, left homonymous hemianopsia, CKD, HLD, Hypothyroidism, osteoprosis,    Examination-Activity Limitations  Bathing;Caring for Others;Carry;Dressing;Locomotion Level;Lift;Reach Overhead;Squat;Stand;Transfers;Stairs    Examination-Participation Restrictions  Church;Cleaning;Community Activity;Driving;Interpersonal Relationship;Laundry;Volunteer;Shop;School;Meal Prep;Yard Work    [employer] officer  Evolving/Moderate complexity    Rehab Potential  Fair    PT Frequency  2x / week    PT Duration  8 weeks    PT Treatment/Interventions  ADLs/Self Care Home Management;Aquatic Therapy;Biofeedback;Cryotherapy;Electrical Stimulation;Iontophoresis 85m/ml Dexamethasone;Moist Heat;Traction;Ultrasound;Parrafin;Therapeutic exercise;Therapeutic activities;Functional mobility training;Stair training;Gait training;DME Instruction;Balance training;Neuromuscular re-education;Patient/family education;Orthotic Fit/Training;Cognitive remediation;Manual techniques;Passive range of motion;Energy conservation;Splinting;Taping;Vestibular;Visual/perceptual remediation/compensation       Patient will benefit from skilled therapeutic intervention in order to improve the following deficits and impairments:  Abnormal gait, Cardiopulmonary status limiting activity, Decreased activity tolerance, Decreased balance, Decreased knowledge of precautions, Decreased endurance, Decreased coordination, Decreased cognition, Decreased  knowledge of use of DME, Decreased mobility, Decreased safety awareness, Difficulty  walking, Decreased strength, Impaired flexibility, Impaired perceived functional ability, Impaired UE functional use, Impaired tone, Postural dysfunction, Improper body mechanics  Visit Diagnosis: Muscle weakness (generalized)  Difficulty walking     Problem List Patient Active Problem List   Diagnosis Date Noted  . Oropharyngeal dysphagia 06/21/2019  . Moderate protein-calorie malnutrition ([place]) 06/21/2019  . Balance disorder 06/21/2019  . Left homonymous hemianopsia 06/21/2019  . Pain due to onychomycosis of toenails of both feet 01/03/2019  . Lewy body dementia without behavioral disturbance ([place]) 07/21/2017  . Elevated PSA 12/09/2016  . Vitamin D deficiency 12/09/2016  . Chronic pain of right knee 11/04/2016  . Hyperlipidemia 11/04/2016  . CKD (chronic kidney disease), stage III 11/04/2016  . Osteoarthritis of multiple joints 11/03/2016  . Chronic right hip pain 11/03/2016  . 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 Hypothyroidism 11/03/2014  . Asthma 11/03/2014  . Osteoporosis 11/03/2014  . Skin lesion of chest wall 11/03/2014  . Trigger finger of both hands 11/03/2014   Janna Arch, PT, DPT   08/25/2019, 5:24 PM  [place] MAIN [facility] SERVICES [address] [place], [place], [address] Phone: [phone]   Fax:  [phone]  Name: HAMMAD FINKLER MRN: [id number] Date of Birth: June 25, 1940

## 2019-08-30 ENCOUNTER — Other Ambulatory Visit: Payer: Self-pay

## 2019-08-30 ENCOUNTER — Ambulatory Visit: Payer: Medicare Other

## 2019-08-30 DIAGNOSIS — M6281 Muscle weakness (generalized): Secondary | ICD-10-CM

## 2019-08-30 DIAGNOSIS — R262 Difficulty in walking, not elsewhere classified: Secondary | ICD-10-CM | POA: Diagnosis not present

## 2019-08-30 NOTE — Therapy (Signed)
[place] MAIN [facility] SERVICES [address] [place], [place], [address] Phone: [phone]   Fax:  [phone]  Physical Therapy Treatment  Patient Details  Name: Blake Bates MRN: [id number] Date of Birth: 1941/02/22 No data recorded  Encounter Date: 08/30/2019  PT End of Session - 08/30/19 1632    Visit Number  15    Number of Visits  16    Date for PT Re-Evaluation  08/31/19    Authorization Type  PN 08/08/19    Authorization - Number of Visits  5    Progress Note Due on Visit  10    PT Start Time  1430    PT Stop Time  1513    PT Time Calculation (min)  43 min    Equipment Utilized During Treatment  Gait belt;Other (comment)   FWW   Behavior During Therapy  WFL for tasks assessed/performed       Past Medical History:  Diagnosis Date  . Asthma   . Diverticulosis   . Full dentures   . Hyperlipidemia   . Hypothyroidism    s/p subtotal thyroidectomy  . Muscle spasms of lower extremity    Right upper leg  . Osteoporosis   . Rhinitis, allergic    pollens, mold, animal dander    Past Surgical History:  Procedure Laterality Date  . CATARACT EXTRACTION W/PHACO Left 09/20/2014   Procedure: CATARACT EXTRACTION PHACO AND INTRAOCULAR LENS PLACEMENT (IOC);  Surgeon: Leandrew Koyanagi, MD;  Location: [place];  Service: Ophthalmology;  Laterality: Left;  . COLONOSCOPY    . PARATHYROIDECTOMY    . THYROID SURGERY     subtotal    There were no vitals filed for this visit.  Subjective Assessment - 08/30/19 1626    Subjective  Patient meets with physician next week and wife will address cognitive decline. No falls since last session.    Patient is accompained by:  Family member    Pertinent History  Pt has Lewy Body dementia and Parkinson's disease, and has difficulty walking due to weakness and balance deficits, as well as difficulty with motor planning and decision making.  Pt is a high fall risk and has fallen 5x in  the past 2 weeks.    Limitations  Standing;Walking;House hold activities    How long can you sit comfortably?  unlimited    How long can you stand comfortably?  5 minutes    How long can you walk comfortably?  2-3 minutes, must hold onto things due to weakness and imbalance    Patient Stated Goals  "to walk better, and feel stronger"    Currently in Pain?  No/denies             Neuromuscular Re-education cues for safety awareness, CGA for all standing interventions.  Holding onto bilateral rails at stairs: step up/down 12x each LE  airex pad: SUE support static hold 60 seconds, very challenging for patient to let go with UE   airex pad: BUE support toe taps on 6" step on stairs.    Sit to stand with SUE support, reach to grab a ball and toss to target for pertubation's, stability, and coordination x 12  Eccentric step down BUE support to sticky notes, max cueing for sequencing x 6 each LE, visible tremor from fatigue   TherEx cues for body mechanics, sequencing and task orientation   standing at the stairs: Lateral step up 6" step, BUE support, max cueing for side  step   seated: 2.5 ankle weight: -march 15x each LE max visual and verbal cueing -ER/step outs 15x each LE, max verbal cueing and visual cue to step on PT's shoes.   GTB single LE adduction 15x each LE GTB single LE hamstring curl 15x each LE    Seated Y's 10x Seated heel toe raise 15x  Ambulate 200 ft with rollator, cueing for upright posture, positioning of self within AD, and longer step length. Occasional Min A to AD for velocity of movement and control.    Patient requires frequent task orientation and sequencing cues for memory. Use of visual demonstration in combination to verbal cues results in best response. Frequent cues are required for L visual field due to L hemi-neglect.     Pt educated throughout session about proper posture and technique with exercises. Improved exercise technique, movement at  target joints, use of target muscles after min to mod verbal, visual, tactile cues.       Patient demonstrates left hemi neglect throughout session with frequent tactile, verbal, and visual cueing required for proper body mechanics and task orientation. He is able to complete all exercises but requiring heavy cues and performance is somewhat limited    Patient's cognition continues to be noticeably deteriorating from each session. Patient's wife is aware and will address with physician. Patient requiring increased cueing for task performance frequently forgetting mid task.  Patient will benefit from skilled physical therapy to increase balance, strength, and functional mobility for decreased fall risk.                      PT Education - 08/30/19 1632    Education Details  exercise technique, body mechanics    Person(s) Educated  Patient    Methods  Explanation;Demonstration;Tactile cues;Verbal cues    Comprehension  Verbalized understanding;Returned demonstration;Verbal cues required;Tactile cues required       PT Short Term Goals - 08/08/19 1521      PT SHORT TERM GOAL #1   Title  Pt and caregiver will be educated in a new HEP for overall LE and UE strengthening, and kitchen sink balance exercises to decrease fall risk.    Baseline  No current exercises per caregiver 3/29: occasionally compliant with HEP    Time  1    Period  Weeks    Status  Partially Met    Target Date  08/22/19      PT SHORT TERM GOAL #2   Title  Pt and wife will be educated in proper use of assistive devices and safety techniques at home (increased lighting, removal of area rugs etc) to decrease fall risk    Baseline  Currently no education on home equipment and not using assistive devices. 3/29: challenged with compliance with use of AD    Time  1    Period  Weeks    Status  On-going    Target Date  07/13/19        PT Long Term Goals - 08/08/19 1521      PT LONG TERM GOAL #1   Title   Pt will have increased overall LE gross strength by at least 1/2-1 muscle grade throughout to decrease his fall risk and improve his ability to ambulate further distances safely.    Baseline  Pt currently at 2+ to 3+/5 grossly throughout UE's, and 3 to 4/5 grossly throughout LE's (see objective notes) 3/29: 4-/5 LE strength    Time  8  Period  Weeks    Status  Partially Met    Target Date  08/31/19      PT LONG TERM GOAL #2   Title  Pt's caregiver will report no greater than 0-1 falls over 1 week period due to improved balance and strength, and improved saftey techniques including assistive device.    Baseline  Pt currently falling about 5 times over 2-week period 3/29: 1 fall since starting therapy (happened in past week)    Time  8    Period  Weeks    Status  Partially Met    Target Date  08/31/19      PT LONG TERM GOAL #3   Title  Pt will have improved Timed Up and Go score to at least 1 minute or better, indicating decreased fall risk since initial PT evaluation.    Baseline  Pt performed TUG in 1 minute and 30 seconds at initial evaluation. 3/29: 29.7 seconds    Time  8    Period  Weeks    Status  Achieved      PT LONG TERM GOAL #4   Title  Pt will have improved 5 Times Sit to Stand score to at least 20 seconds or better, indicating decreased fall risk since time of initial evaluation.    Baseline  Pt performed 5 Times Sit to Stand in 37 seconds at initial evaluation. 3/29; 26.07 seconds w/o UE support (holding onto rainbow ball)    Time  8    Period  Weeks    Status  Partially Met    Target Date  08/31/19      PT LONG TERM GOAL #5   Title  Pt will ascend/ descend 4 stairs safely with use of B handrails with SB/CGA to decrease fall risk.    Baseline  Pt currently trips of the 4 steps he uses to enter/exit his home per wife's report. 3/29: one step at a time with lateral steppage descending    Time  8    Period  Weeks    Status  Partially Met    Target Date  08/31/19             Plan - 08/30/19 1633    Clinical Impression Statement  Patient's cognition continues to be noticeably deteriorating from each session. Patient's wife is aware and will address with physician. Patient requiring increased cueing for task performance frequently forgetting mid task.  Patient will benefit from skilled physical therapy to increase balance, strength, and functional mobility for decreased fall risk.    Personal Factors and Comorbidities  Age;Comorbidity 3+;Fitness;Past/Current Experience;Time since onset of injury/illness/exacerbation    Comorbidities  balance, left homonymous hemianopsia, CKD, HLD, Hypothyroidism, osteoprosis,    Examination-Activity Limitations  Bathing;Caring for Others;Carry;Dressing;Locomotion Level;Lift;Reach Overhead;Squat;Stand;Transfers;Stairs    Examination-Participation Restrictions  Church;Cleaning;Community Activity;Driving;Interpersonal Relationship;Laundry;Volunteer;Shop;School;Meal Prep;Yard Work    [employer]  Evolving/Moderate complexity    Rehab Potential  Fair    PT Frequency  2x / week    PT Duration  8 weeks    PT Treatment/Interventions  ADLs/Self Care Home Management;Aquatic Therapy;Biofeedback;Cryotherapy;Electrical Stimulation;Iontophoresis 71m/ml Dexamethasone;Moist Heat;Traction;Ultrasound;Parrafin;Therapeutic exercise;Therapeutic activities;Functional mobility training;Stair training;Gait training;DME Instruction;Balance training;Neuromuscular re-education;Patient/family education;Orthotic Fit/Training;Cognitive remediation;Manual techniques;Passive range of motion;Energy conservation;Splinting;Taping;Vestibular;Visual/perceptual remediation/compensation       Patient will benefit from skilled therapeutic intervention in order to improve the following deficits and impairments:  Abnormal gait, Cardiopulmonary status limiting activity, Decreased activity tolerance, Decreased balance, Decreased knowledge of  precautions, Decreased endurance, Decreased coordination, Decreased cognition, Decreased  knowledge of use of DME, Decreased mobility, Decreased safety awareness, Difficulty walking, Decreased strength, Impaired flexibility, Impaired perceived functional ability, Impaired UE functional use, Impaired tone, Postural dysfunction, Improper body mechanics  Visit Diagnosis: Muscle weakness (generalized)  Difficulty walking     Problem List Patient Active Problem List   Diagnosis Date Noted  . Oropharyngeal dysphagia 06/21/2019  . Moderate protein-calorie malnutrition ([place]) 06/21/2019  . Balance disorder 06/21/2019  . Left homonymous hemianopsia 06/21/2019  . Pain due to onychomycosis of toenails of both feet 01/03/2019  . Lewy body dementia without behavioral disturbance ([place]) 07/21/2017  . Elevated PSA 12/09/2016  . Vitamin D deficiency 12/09/2016  . Chronic pain of right knee 11/04/2016  . Hyperlipidemia 11/04/2016  . CKD (chronic kidney disease), stage III 11/04/2016  . Osteoarthritis of multiple joints 11/03/2016  . Chronic right hip pain 11/03/2016  . 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 Hypothyroidism 11/03/2014  . Asthma 11/03/2014  . Osteoporosis 11/03/2014  . Skin lesion of chest wall 11/03/2014  . Trigger finger of both hands 11/03/2014   Janna Arch, PT, DPT   08/30/2019, 4:34 PM  [place] MAIN [facility] SERVICES [address] [place], [place], [address] Phone: [phone]   Fax:  [phone]  Name: Blake Bates MRN: [id number] Date of Birth: Sep 03, 1940

## 2019-09-01 ENCOUNTER — Other Ambulatory Visit: Payer: Self-pay

## 2019-09-01 ENCOUNTER — Ambulatory Visit: Payer: Medicare Other

## 2019-09-01 DIAGNOSIS — R262 Difficulty in walking, not elsewhere classified: Secondary | ICD-10-CM

## 2019-09-01 DIAGNOSIS — M6281 Muscle weakness (generalized): Secondary | ICD-10-CM

## 2019-09-01 NOTE — Therapy (Signed)
[place] MAIN [facility] SERVICES [place], [place], [address] Phone: [phone]   Fax:  [phone]  Physical Therapy Treatment/RECERT  Patient Details  Name: Blake Bates MRN: [id number] Date of Birth: 06-05-1940 No data recorded  Encounter Date: 09/01/2019  PT End of Session - 09/02/19 1002    Visit Number  16    Number of Visits  32    Date for PT Re-Evaluation  10/27/19    Authorization Type  PN 08/08/19    Authorization - Number of Visits  6    Progress Note Due on Visit  10    PT Start Time  1430    PT Stop Time  4680    PT Time Calculation (min)  44 min    Equipment Utilized During Treatment  Gait belt;Other (comment)   FWW   Behavior During Therapy  WFL for tasks assessed/performed       Past Medical History:  Diagnosis Date  . Asthma   . Diverticulosis   . Full dentures   . Hyperlipidemia   . Hypothyroidism    s/p subtotal thyroidectomy  . Muscle spasms of lower extremity    Right upper leg  . Osteoporosis   . Rhinitis, allergic    pollens, mold, animal dander    Past Surgical History:  Procedure Laterality Date  . CATARACT EXTRACTION W/PHACO Left 09/20/2014   Procedure: CATARACT EXTRACTION PHACO AND INTRAOCULAR LENS PLACEMENT (IOC);  Surgeon: Leandrew Koyanagi, MD;  Location: [place];  Service: Ophthalmology;  Laterality: Left;  . COLONOSCOPY    . PARATHYROIDECTOMY    . THYROID SURGERY     subtotal    There were no vitals filed for this visit.  Subjective Assessment - 09/02/19 0958    Subjective  Patient's wife reports the patient still won't eat anything but oreos and cheerios, has lost 7lb. Has continued cognitive decline, will see physician next week.    Patient is accompained by:  Family member    Pertinent History  Pt has Lewy Body dementia and Parkinson's disease, and has difficulty walking due to weakness and balance deficits, as well as difficulty with motor planning and  decision making.  Pt is a high fall risk and has fallen 5x in the past 2 weeks.    Limitations  Standing;Walking;House hold activities    How long can you sit comfortably?  unlimited    How long can you stand comfortably?  5 minutes    How long can you walk comfortably?  2-3 minutes, must hold onto things due to weakness and imbalance    Patient Stated Goals  "to walk better, and feel stronger"    Currently in Pain?  No/denies                 Recert  Goals:  FOTO: 32/122  LE strength  Right Left  Hip flexion 4-/5 4-/5  Hip Abduction 3+/5 3/5  Hip Adduction 3+/5 3/5  Knee Extension  3+/5 3+/5  Knee Flexion 3+/5 3+/5  DF 3+/5 3+/5  PF 3+/5 3+/5   10 MWT: 41 seconds no UE support  TUG: 45 seconds first trial, knocked cone over; 52 seconds second trial ; very challenged cognitively , 50 seconds with rollator.   Falls: 3 weeks ago  5x STS: 21 seconds heavy UE support ; 20 seconds hands on knees. Challenge pushing up on arms. ; multiple attempts and trials due to confusion  Ascend/descend 4 stairs: deferred due  to recent performance/cognition  Seated hamstring curl RTB 15x Ambulate with RW; education on placement of self within walker, propulsion, etc. 2x 50 ft with max cueing for sequencing, mod A for turn.     Patient demonstrates left hemi neglect throughout session with frequent tactile, verbal, and visual cueing required for proper body mechanics and task orientation. He is able to complete all exercises but requiring heavy cues and performance is somewhat limited    Patient's cognition continues to be noticeably deteriorating from each session. Patient's wife is aware and will address with physician. Patient requiring increased cueing for task performance frequently forgetting mid task.  Patient will benefit from skilled physical therapy to increase balance, strength, and functional mobility for decreased fall risk.    Patient's physical mobility, function, and  cognition has had a decline since goals were last tested as can be seen in outcome measures. Patient is to see physician next week to address cognitive decline. At this time patient's limited mobility is primarily limited by cognition and patient would benefit from a trial period for recert to allow for stabilization of cognition. Patient will benefit from skilled physical therapy to increase balance, strength, and functional mobility for decreased fall risk          PT Education - 09/02/19 1001    Education Details  goals, POC, trial period    Person(s) Educated  Patient;Spouse    Methods  Explanation;Demonstration;Tactile cues;Verbal cues    Comprehension  Verbalized understanding;Returned demonstration;Verbal cues required;Tactile cues required       PT Short Term Goals - 09/02/19 1013      PT SHORT TERM GOAL #1   Title  Pt and caregiver will be educated in a new HEP for overall LE and UE strengthening, and kitchen sink balance exercises to decrease fall risk.    Baseline  No current exercises per caregiver 3/29: occasionally compliant with HEP 4/23: caregiver challenged to help due to patient cognition    Time  2    Period  Weeks    Status  On-going    Target Date  09/16/19      PT SHORT TERM GOAL #2   Title  Pt and wife will be educated in proper use of assistive devices and safety techniques at home (increased lighting, removal of area rugs etc) to decrease fall risk    Baseline  Currently no education on home equipment and not using assistive devices. 3/29: challenged with compliance with use of AD 4/23: was utilizing rollator prior to sudden cognitive decline    Time  2    Period  Weeks    Status  On-going    Target Date  09/16/19        PT Long Term Goals - 09/02/19 1006      PT LONG TERM GOAL #1   Title  Pt will have increased overall LE gross strength by at least 1/2-1 muscle grade throughout to decrease his fall risk and improve his ability to ambulate further  distances safely.    Baseline  Pt currently at 2+ to 3+/5 grossly throughout UE's, and 3 to 4/5 grossly throughout LE's (see objective notes) 3/29: 4-/5 LE strength 4/23: grossly 3+/5    Time  8    Period  Weeks    Status  On-going    Target Date  10/27/19      PT LONG TERM GOAL #2   Title  Pt's caregiver will report no greater than 0-1 falls over 1  week period due to improved balance and strength, and improved saftey techniques including assistive device.    Baseline  Pt currently falling about 5 times over 2-week period 3/29: 1 fall since starting therapy (happened in past week) 4/23: no falls in 3 weeks    Time  8    Period  Weeks    Status  Achieved      PT LONG TERM GOAL #3   Title  Pt will have improved Timed Up and Go score to at least 1 minute or better, indicating decreased fall risk since initial PT evaluation.    Baseline  Pt performed TUG in 1 minute and 30 seconds at initial evaluation. 3/29: 29.7 seconds 4/23: 45 seconds    Time  8    Period  Weeks    Status  Achieved      PT LONG TERM GOAL #4   Title  Pt will have improved 5 Times Sit to Stand score to at least 20 seconds or better, indicating decreased fall risk since time of initial evaluation.    Baseline  Pt performed 5 Times Sit to Stand in 37 seconds at initial evaluation. 3/29; 26.07 seconds w/o UE support (holding onto rainbow ball) 4/23: 20 seconds with hands on knees, unable to perform without UE support.    Time  8    Period  Weeks    Status  Partially Met    Target Date  10/27/19      PT LONG TERM GOAL #5   Title  Pt will ascend/ descend 4 stairs safely with use of B handrails with SB/CGA to decrease fall risk.    Baseline  Pt currently trips of the 4 steps he uses to enter/exit his home per wife's report. 3/29: one step at a time with lateral steppage descending 4/23: deferred due to cognition/confusion    Time  8    Period  Weeks    Status  Partially Met    Target Date  10/27/19      Additional Long  Term Goals   Additional Long Term Goals  Yes      PT LONG TERM GOAL #6   Title  Pt will have improved Timed Up and Go score to at least 20 seconds or better, indicating decreased fall risk since initial PT evaluation.    Baseline  4/23: 45 seconds without AD.    Time  8    Period  Weeks    Status  New    Target Date  10/27/19            Plan - 09/02/19 1004    Clinical Impression Statement  Patient's physical mobility, function, and cognition has had a decline since goals were last tested as can be seen in outcome measures. Patient is to see physician next week to address cognitive decline. At this time patient's limited mobility is primarily limited by cognition and patient would benefit from a trial period for recert to allow for stabilization of cognition. Patient will benefit from skilled physical therapy to increase balance, strength, and functional mobility for decreased fall risk    Personal Factors and Comorbidities  Age;Comorbidity 3+;Fitness;Past/Current Experience;Time since onset of injury/illness/exacerbation    Comorbidities  balance, left homonymous hemianopsia, CKD, HLD, Hypothyroidism, osteoprosis,    Examination-Activity Limitations  Bathing;Caring for Others;Carry;Dressing;Locomotion Level;Lift;Reach Overhead;Squat;Stand;Transfers;Stairs    Examination-Participation Restrictions  Church;Cleaning;Community Activity;Driving;Interpersonal Relationship;Laundry;Volunteer;Shop;School;Meal Prep;Yard Work    Copy  PT Frequency  2x / week    PT Duration  8 weeks    PT Treatment/Interventions  ADLs/Self Care Home Management;Aquatic Therapy;Biofeedback;Cryotherapy;Electrical Stimulation;Iontophoresis 5m/ml Dexamethasone;Moist Heat;Traction;Ultrasound;Parrafin;Therapeutic exercise;Therapeutic activities;Functional mobility training;Stair training;Gait training;DME Instruction;Balance  training;Neuromuscular re-education;Patient/family education;Orthotic Fit/Training;Cognitive remediation;Manual techniques;Passive range of motion;Energy conservation;Splinting;Taping;Vestibular;Visual/perceptual remediation/compensation       Patient will benefit from skilled therapeutic intervention in order to improve the following deficits and impairments:  Abnormal gait, Cardiopulmonary status limiting activity, Decreased activity tolerance, Decreased balance, Decreased knowledge of precautions, Decreased endurance, Decreased coordination, Decreased cognition, Decreased knowledge of use of DME, Decreased mobility, Decreased safety awareness, Difficulty walking, Decreased strength, Impaired flexibility, Impaired perceived functional ability, Impaired UE functional use, Impaired tone, Postural dysfunction, Improper body mechanics  Visit Diagnosis: Muscle weakness (generalized)  Difficulty walking     Problem List Patient Active Problem List   Diagnosis Date Noted  . Oropharyngeal dysphagia 06/21/2019  . Moderate protein-calorie malnutrition ([place] 06/21/2019  . Balance disorder 06/21/2019  . Left homonymous hemianopsia 06/21/2019  . Pain due to onychomycosis of toenails of both feet 01/03/2019  . Lewy body dementia without behavioral disturbance ([place] 07/21/2017  . Elevated PSA 12/09/2016  . Vitamin D deficiency 12/09/2016  . Chronic pain of right knee 11/04/2016  . Hyperlipidemia 11/04/2016  . CKD (chronic kidney disease), stage III 11/04/2016  . Osteoarthritis of multiple joints 11/03/2016  . Chronic right hip pain 11/03/2016  . 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 Hypothyroidism 11/03/2014  . Asthma 11/03/2014  . Osteoporosis 11/03/2014  . Skin lesion of chest wall 11/03/2014  . Trigger finger of both hands 11/03/2014   MJanna Arch PT, DPT   09/02/2019, 10:14 AM  [place] [facility] [address] [place] [address]Phone: [phone]  Fax:  [phone] Name: CELAM ELLISMRN: [id number] of Birth: [birth year]

## 2019-09-02 ENCOUNTER — Ambulatory Visit: Payer: Medicare Other | Admitting: Neurology

## 2019-09-06 ENCOUNTER — Other Ambulatory Visit: Payer: Self-pay

## 2019-09-06 ENCOUNTER — Ambulatory Visit: Payer: Medicare Other

## 2019-09-06 DIAGNOSIS — R262 Difficulty in walking, not elsewhere classified: Secondary | ICD-10-CM | POA: Diagnosis not present

## 2019-09-06 DIAGNOSIS — M6281 Muscle weakness (generalized): Secondary | ICD-10-CM

## 2019-09-06 NOTE — Therapy (Signed)
[place] MAIN [facility] SERVICES [address] [place], [place], [address] Phone: [phone]   Fax:  [phone]  Physical Therapy Treatment  Patient Details  Name: Blake Bates MRN: [id number] Date of Birth: 1941-04-13 No data recorded  Encounter Date: 09/06/2019  PT End of Session - 09/06/19 1436    Visit Number  17    Number of Visits  32    Date for PT Re-Evaluation  10/27/19    Authorization Type  PN 08/08/19    Authorization - Number of Visits  7    Progress Note Due on Visit  10    PT Start Time  6789    PT Stop Time  3810    PT Time Calculation (min)  45 min    Equipment Utilized During Treatment  Gait belt;Other (comment)   FWW   Behavior During Therapy  WFL for tasks assessed/performed       Past Medical History:  Diagnosis Date  . Asthma   . Diverticulosis   . Full dentures   . Hyperlipidemia   . Hypothyroidism    s/p subtotal thyroidectomy  . Muscle spasms of lower extremity    Right upper leg  . Osteoporosis   . Rhinitis, allergic    pollens, mold, animal dander    Past Surgical History:  Procedure Laterality Date  . CATARACT EXTRACTION W/PHACO Left 09/20/2014   Procedure: CATARACT EXTRACTION PHACO AND INTRAOCULAR LENS PLACEMENT (IOC);  Surgeon: Leandrew Koyanagi, MD;  Location: [facility];  Service: Ophthalmology;  Laterality: Left;  . COLONOSCOPY    . PARATHYROIDECTOMY    . THYROID SURGERY     subtotal    There were no vitals filed for this visit.  Subjective Assessment - 09/06/19 1434    Subjective  Patient's wife reports patient fell off bed yesterday onto the floor. Wife helped him get to the end of the bed and pull up.    Patient is accompained by:  Family member    Pertinent History  Pt has Lewy Body dementia and Parkinson's disease, and has difficulty walking due to weakness and balance deficits, as well as difficulty with motor planning and decision making.  Pt is a high fall risk and  has fallen 5x in the past 2 weeks.    Limitations  Standing;Walking;House hold activities    How long can you sit comfortably?  unlimited    How long can you stand comfortably?  5 minutes    How long can you walk comfortably?  2-3 minutes, must hold onto things due to weakness and imbalance    Patient Stated Goals  "to walk better, and feel stronger"    Currently in Pain?  No/denies            Neuromuscular Re-education cues for safety awareness, CGA for all standing interventions.   airex pad: BUE support toe taps on 6" step on stairs; 15x each LE   airex pad: throw object at target x 20, cues for upright posture  Sit to stand with SUE support, reach to grab a ball and toss to target for pertubation's, stability, and coordination x 12   Step up/down 6" step and consecutive 4" step. 6x length of // bars.    Step over and back orange hurdle 15x each LE   TherEx cues for body mechanics, sequencing and task orientation    Standing: 3 ankle weight: -march 6x length of // bars with max cueing for sequencing for body  mechanics.  -Side step 4x length of // bars,   -hip extension 10x each LE   seated GTB single LE adduction 15x each LE GTB single LE hamstring curl 15x each LE      Patient requires frequent task orientation and sequencing cues for memory. Use of visual demonstration in combination to verbal cues results in best response. Frequent cues are required for L visual field due to L hemi-neglect.     Pt educated throughout session about proper posture and technique with exercises. Improved exercise technique, movement at target joints, use of target muscles after min to mod verbal, visual, tactile cues.      Patient demonstrates left hemi neglect throughout session with frequent tactile, verbal, and visual cueing required for proper body mechanics and task orientation. He is able to complete all exercises but requiring heavy cues and performance is somewhat limited      Patient improves with task performance with repetition however is challenged initially requiring max cueing with verbal, tactile, and visual cues. Will see physician tomorrow for cognition decline, Patients wife aware to request information for caregiver/aide. Patient's cognition continues to be noticeably deteriorating from each session. Patient's wife is aware and will address with physician. Patient requiring increased cueing for task performance frequently forgetting mid task.  Patient will benefit from skilled physical therapy to increase balance, strength, and functional mobility for decreased fall risk.                    PT Education - 09/06/19 1435    Education Details  exercise technique, body mechanics    Person(s) Educated  Patient;Spouse    Methods  Explanation;Demonstration;Tactile cues;Verbal cues    Comprehension  Verbalized understanding;Returned demonstration;Verbal cues required;Tactile cues required       PT Short Term Goals - 09/02/19 1013      PT SHORT TERM GOAL #1   Title  Pt and caregiver will be educated in a new HEP for overall LE and UE strengthening, and kitchen sink balance exercises to decrease fall risk.    Baseline  No current exercises per caregiver 3/29: occasionally compliant with HEP 4/23: caregiver challenged to help due to patient cognition    Time  2    Period  Weeks    Status  On-going    Target Date  09/16/19      PT SHORT TERM GOAL #2   Title  Pt and wife will be educated in proper use of assistive devices and safety techniques at home (increased lighting, removal of area rugs etc) to decrease fall risk    Baseline  Currently no education on home equipment and not using assistive devices. 3/29: challenged with compliance with use of AD 4/23: was utilizing rollator prior to sudden cognitive decline    Time  2    Period  Weeks    Status  On-going    Target Date  09/16/19        PT Long Term Goals - 09/02/19 1006      PT LONG  TERM GOAL #1   Title  Pt will have increased overall LE gross strength by at least 1/2-1 muscle grade throughout to decrease his fall risk and improve his ability to ambulate further distances safely.    Baseline  Pt currently at 2+ to 3+/5 grossly throughout UE's, and 3 to 4/5 grossly throughout LE's (see objective notes) 3/29: 4-/5 LE strength 4/23: grossly 3+/5    Time  8  Period  Weeks    Status  On-going    Target Date  10/27/19      PT LONG TERM GOAL #2   Title  Pt's caregiver will report no greater than 0-1 falls over 1 week period due to improved balance and strength, and improved saftey techniques including assistive device.    Baseline  Pt currently falling about 5 times over 2-week period 3/29: 1 fall since starting therapy (happened in past week) 4/23: no falls in 3 weeks    Time  8    Period  Weeks    Status  Achieved      PT LONG TERM GOAL #3   Title  Pt will have improved Timed Up and Go score to at least 1 minute or better, indicating decreased fall risk since initial PT evaluation.    Baseline  Pt performed TUG in 1 minute and 30 seconds at initial evaluation. 3/29: 29.7 seconds 4/23: 45 seconds    Time  8    Period  Weeks    Status  Achieved      PT LONG TERM GOAL #4   Title  Pt will have improved 5 Times Sit to Stand score to at least 20 seconds or better, indicating decreased fall risk since time of initial evaluation.    Baseline  Pt performed 5 Times Sit to Stand in 37 seconds at initial evaluation. 3/29; 26.07 seconds w/o UE support (holding onto rainbow ball) 4/23: 20 seconds with hands on knees, unable to perform without UE support.    Time  8    Period  Weeks    Status  Partially Met    Target Date  10/27/19      PT LONG TERM GOAL #5   Title  Pt will ascend/ descend 4 stairs safely with use of B handrails with SB/CGA to decrease fall risk.    Baseline  Pt currently trips of the 4 steps he uses to enter/exit his home per wife's report. 3/29: one step at a  time with lateral steppage descending 4/23: deferred due to cognition/confusion    Time  8    Period  Weeks    Status  Partially Met    Target Date  10/27/19      Additional Long Term Goals   Additional Long Term Goals  Yes      PT LONG TERM GOAL #6   Title  Pt will have improved Timed Up and Go score to at least 20 seconds or better, indicating decreased fall risk since initial PT evaluation.    Baseline  4/23: 45 seconds without AD.    Time  8    Period  Weeks    Status  New    Target Date  10/27/19            Plan - 09/07/19 1253    Clinical Impression Statement  Patient improves with task performance with repetition however is challenged initially requiring max cueing with verbal, tactile, and visual cues. Will see physician tomorrow for cognition decline, Patients wife aware to request information for caregiver/aide. Patient's cognition continues to be noticeably deteriorating from each session. Patient's wife is aware and will address with physician. Patient requiring increased cueing for task performance frequently forgetting mid task.  Patient will benefit from skilled physical therapy to increase balance, strength, and functional mobility for decreased fall risk.    Personal Factors and Comorbidities  Age;Comorbidity 3+;Fitness;Past/Current Experience;Time since onset of injury/illness/exacerbation    Comorbidities  balance, left homonymous hemianopsia, CKD, HLD, Hypothyroidism, osteoprosis,    Examination-Activity Limitations  Bathing;Caring for Others;Carry;Dressing;Locomotion Level;Lift;Reach Overhead;Squat;Stand;Transfers;Stairs    Examination-Participation Restrictions  Church;Cleaning;Community Activity;Driving;Interpersonal Relationship;Laundry;Volunteer;Shop;School;Meal Prep;Yard Work    [employer]  Evolving/Moderate complexity    Rehab Potential  Fair    PT Frequency  2x / week    PT Duration  8 weeks    PT Treatment/Interventions  ADLs/Self  Care Home Management;Aquatic Therapy;Biofeedback;Cryotherapy;Electrical Stimulation;Iontophoresis 77m/ml Dexamethasone;Moist Heat;Traction;Ultrasound;Parrafin;Therapeutic exercise;Therapeutic activities;Functional mobility training;Stair training;Gait training;DME Instruction;Balance training;Neuromuscular re-education;Patient/family education;Orthotic Fit/Training;Cognitive remediation;Manual techniques;Passive range of motion;Energy conservation;Splinting;Taping;Vestibular;Visual/perceptual remediation/compensation       Patient will benefit from skilled therapeutic intervention in order to improve the following deficits and impairments:  Abnormal gait, Cardiopulmonary status limiting activity, Decreased activity tolerance, Decreased balance, Decreased knowledge of precautions, Decreased endurance, Decreased coordination, Decreased cognition, Decreased knowledge of use of DME, Decreased mobility, Decreased safety awareness, Difficulty walking, Decreased strength, Impaired flexibility, Impaired perceived functional ability, Impaired UE functional use, Impaired tone, Postural dysfunction, Improper body mechanics  Visit Diagnosis: Muscle weakness (generalized)  Difficulty walking     Problem List Patient Active Problem List   Diagnosis Date Noted  . Oropharyngeal dysphagia 06/21/2019  . Moderate protein-calorie malnutrition ([place] 06/21/2019  . Balance disorder 06/21/2019  . Left homonymous hemianopsia 06/21/2019  . Pain due to onychomycosis of toenails of both feet 01/03/2019  . Lewy body dementia without behavioral disturbance ([place] 07/21/2017  . Elevated PSA 12/09/2016  . Vitamin D deficiency 12/09/2016  . Chronic pain of right knee 11/04/2016  . Hyperlipidemia 11/04/2016  . CKD (chronic kidney disease), stage III 11/04/2016  . Osteoarthritis of multiple joints 11/03/2016  . Chronic right hip pain 11/03/2016  . 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  Hypothyroidism 11/03/2014  . Asthma 11/03/2014  . Osteoporosis 11/03/2014  . Skin lesion of chest wall 11/03/2014  . Trigger finger of both hands 11/03/2014   MJanna Arch PT, DPT   09/07/2019, 12:54 PM  [place] [facility] CenterSERVICES [address]RPateros [place] [address]Phone: [phone]  Fax:  [phone] Name: Blake LARUSSOMRN: [id number] of Birth: [birth year]

## 2019-09-07 ENCOUNTER — Ambulatory Visit: Payer: Medicare Other | Admitting: Neurology

## 2019-09-07 ENCOUNTER — Other Ambulatory Visit: Payer: Self-pay

## 2019-09-07 ENCOUNTER — Encounter: Payer: Self-pay | Admitting: Neurology

## 2019-09-07 VITALS — BP 137/72 | HR 74 | Ht 70.0 in | Wt 133.8 lb

## 2019-09-07 DIAGNOSIS — F0281 Dementia in other diseases classified elsewhere with behavioral disturbance: Secondary | ICD-10-CM

## 2019-09-07 DIAGNOSIS — G3183 Dementia with Lewy bodies: Secondary | ICD-10-CM

## 2019-09-07 MED ORDER — DONEPEZIL HCL 10 MG PO TABS: ORAL_TABLET | ORAL | 3 refills | Status: DC

## 2019-09-07 MED ORDER — MIRTAZAPINE 7.5 MG PO TABS: 7.5000 mg | ORAL_TABLET | Freq: Every day | ORAL | 5 refills | Status: DC
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
## 2019-09-07 NOTE — Patient Instructions (Signed)
1. Start Mirtazapine 7.5mg : take 1 tablet every night. Contact our office for an update in 2 weeks, we can increase the dose if no side effects  2. Continue Donepezil 10mg  daily  3. Continue PT, regular exercise, would use walker for outdoor activities  4. Recommend getting an aide at home  5. Follow-up in 3-4 months, call for any changes

## 2019-09-08 ENCOUNTER — Ambulatory Visit: Payer: Medicare Other

## 2019-09-13 ENCOUNTER — Ambulatory Visit: Payer: Medicare Other | Attending: Neurology

## 2019-09-13 ENCOUNTER — Other Ambulatory Visit: Payer: Self-pay

## 2019-09-13 DIAGNOSIS — M6281 Muscle weakness (generalized): Secondary | ICD-10-CM

## 2019-09-13 DIAGNOSIS — G3183 Dementia with Lewy bodies: Secondary | ICD-10-CM | POA: Diagnosis not present

## 2019-09-13 DIAGNOSIS — F0280 Dementia in other diseases classified elsewhere without behavioral disturbance: Secondary | ICD-10-CM | POA: Insufficient documentation

## 2019-09-13 DIAGNOSIS — R1312 Dysphagia, oropharyngeal phase: Secondary | ICD-10-CM | POA: Diagnosis not present

## 2019-09-13 DIAGNOSIS — R262 Difficulty in walking, not elsewhere classified: Secondary | ICD-10-CM | POA: Insufficient documentation

## 2019-09-13 DIAGNOSIS — N401 Enlarged prostate with lower urinary tract symptoms: Secondary | ICD-10-CM | POA: Diagnosis not present

## 2019-09-13 DIAGNOSIS — N183 Chronic kidney disease, stage 3 unspecified: Secondary | ICD-10-CM | POA: Insufficient documentation

## 2019-09-13 DIAGNOSIS — M159 Polyosteoarthritis, unspecified: Secondary | ICD-10-CM | POA: Diagnosis not present

## 2019-09-13 DIAGNOSIS — M810 Age-related osteoporosis without current pathological fracture: Secondary | ICD-10-CM | POA: Insufficient documentation

## 2019-09-13 DIAGNOSIS — E440 Moderate protein-calorie malnutrition: Secondary | ICD-10-CM | POA: Diagnosis not present

## 2019-09-13 NOTE — Therapy (Signed)
[place] MAIN [facility] SERVICES [address] [place], [place], [address] Phone: [phone]   Fax:  [phone]  Physical Therapy Treatment  Patient Details  Name: Blake Bates MRN: [id number] Date of Birth: April 18, 1941 No data recorded  Encounter Date: 09/13/2019  PT End of Session - 09/13/19 1252    Visit Number  18    Number of Visits  32    Date for PT Re-Evaluation  10/27/19    Authorization Type  PN 08/08/19    Authorization - Number of Visits  8    Progress Note Due on Visit  10    PT Start Time  2060    PT Stop Time  1344    PT Time Calculation (min)  45 min    Equipment Utilized During Treatment  Gait belt;Other (comment)   FWW   Behavior During Therapy  WFL for tasks assessed/performed       Past Medical History:  Diagnosis Date  . Asthma   . Diverticulosis   . Full dentures   . Hyperlipidemia   . Hypothyroidism    s/p subtotal thyroidectomy  . Muscle spasms of lower extremity    Right upper leg  . Osteoporosis   . Rhinitis, allergic    pollens, mold, animal dander    Past Surgical History:  Procedure Laterality Date  . CATARACT EXTRACTION W/PHACO Left 09/20/2014   Procedure: CATARACT EXTRACTION PHACO AND INTRAOCULAR LENS PLACEMENT (IOC);  Surgeon: Leandrew Koyanagi, MD;  Location: [place];  Service: Ophthalmology;  Laterality: Left;  . COLONOSCOPY    . PARATHYROIDECTOMY    . THYROID SURGERY     subtotal    There were no vitals filed for this visit.  Subjective Assessment - 09/14/19 0747    Subjective  Patient went to doctor. Was diagnosed with Lewy Body Dementia and diagnosed with Parkinson's syndrome. Is now on a new mood stabilizer. Has been outside and walking with walker.    Patient is accompained by:  Family member    Pertinent History  Pt has Lewy Body dementia and Parkinson's disease, and has difficulty walking due to weakness and balance deficits, as well as difficulty with motor  planning and decision making.  Pt is a high fall risk and has fallen 5x in the past 2 weeks.    Limitations  Standing;Walking;House hold activities    How long can you sit comfortably?  unlimited    How long can you stand comfortably?  5 minutes    How long can you walk comfortably?  2-3 minutes, must hold onto things due to weakness and imbalance    Patient Stated Goals  "to walk better, and feel stronger"    Currently in Pain?  No/denies              Neuromuscular Re-education cues for safety awareness, CGA for all standing interventions.    seated soccer ball kicks for coordination, spatial awareness, muscle activation sequencing and timing x 4 minutes; challenging for LLE   ambulate over three therabands on the floor for carryover to home negotiation between rooms/changing surfaces/changing color of flooring. Patient challenged initially, fearful and hesitating but improved with repetition x 3 trials.   TherEx cues for body mechanics, sequencing and task orientation    Sit to stand from plinth table, RUE support 10x Sit, lean to left to grab a ball, stand up toss ball into hoop for coordination and pertubation and return to to sitting position. x20  Ambulate 96 ft with RW x 2 trials, patient c/o of feeling unsteady due to slippery soles of shoes, able to perform with improved pace however and close CGA/occasional min A for turns/object negotiation   seated: Arms abducted then raised overhead, then reach for floor for LSVT derived posture intervention x 8, max cueing required for technique/body mechanics.  LAQ with 3lb ankle weights: use of soccer ball for coordination for kicks due to confusion x10-12 each LE   Patient requires frequent task orientation and sequencing cues for memory. Use of visual demonstration in combination to verbal cues results in best response. Frequent cues are required for L visual field due to L hemi-neglect.     Pt educated throughout session about  proper posture and technique with exercises. Improved exercise technique, movement at target joints, use of target muscles after min to mod verbal, visual, tactile cues.    Patient has recently been to physician and now is on new medication. Slight improvement in mood is noted throughout session however does come and go with patient's frustration. Maximal cueing is required throughout session for task orientaiton and sequencing.                       PT Education - 09/13/19 1252    Education Details  exercise technique, body mechanics    Person(s) Educated  Patient    Methods  Explanation;Demonstration;Tactile cues;Verbal cues    Comprehension  Verbalized understanding;Returned demonstration;Verbal cues required;Tactile cues required       PT Short Term Goals - 09/02/19 1013      PT SHORT TERM GOAL #1   Title  Pt and caregiver will be educated in a new HEP for overall LE and UE strengthening, and kitchen sink balance exercises to decrease fall risk.    Baseline  No current exercises per caregiver 3/29: occasionally compliant with HEP 4/23: caregiver challenged to help due to patient cognition    Time  2    Period  Weeks    Status  On-going    Target Date  09/16/19      PT SHORT TERM GOAL #2   Title  Pt and wife will be educated in proper use of assistive devices and safety techniques at home (increased lighting, removal of area rugs etc) to decrease fall risk    Baseline  Currently no education on home equipment and not using assistive devices. 3/29: challenged with compliance with use of AD 4/23: was utilizing rollator prior to sudden cognitive decline    Time  2    Period  Weeks    Status  On-going    Target Date  09/16/19        PT Long Term Goals - 09/02/19 1006      PT LONG TERM GOAL #1   Title  Pt will have increased overall LE gross strength by at least 1/2-1 muscle grade throughout to decrease his fall risk and improve his ability to ambulate further  distances safely.    Baseline  Pt currently at 2+ to 3+/5 grossly throughout UE's, and 3 to 4/5 grossly throughout LE's (see objective notes) 3/29: 4-/5 LE strength 4/23: grossly 3+/5    Time  8    Period  Weeks    Status  On-going    Target Date  10/27/19      PT LONG TERM GOAL #2   Title  Pt's caregiver will report no greater than 0-1 falls over 1 week period due  to improved balance and strength, and improved saftey techniques including assistive device.    Baseline  Pt currently falling about 5 times over 2-week period 3/29: 1 fall since starting therapy (happened in past week) 4/23: no falls in 3 weeks    Time  8    Period  Weeks    Status  Achieved      PT LONG TERM GOAL #3   Title  Pt will have improved Timed Up and Go score to at least 1 minute or better, indicating decreased fall risk since initial PT evaluation.    Baseline  Pt performed TUG in 1 minute and 30 seconds at initial evaluation. 3/29: 29.7 seconds 4/23: 45 seconds    Time  8    Period  Weeks    Status  Achieved      PT LONG TERM GOAL #4   Title  Pt will have improved 5 Times Sit to Stand score to at least 20 seconds or better, indicating decreased fall risk since time of initial evaluation.    Baseline  Pt performed 5 Times Sit to Stand in 37 seconds at initial evaluation. 3/29; 26.07 seconds w/o UE support (holding onto rainbow ball) 4/23: 20 seconds with hands on knees, unable to perform without UE support.    Time  8    Period  Weeks    Status  Partially Met    Target Date  10/27/19      PT LONG TERM GOAL #5   Title  Pt will ascend/ descend 4 stairs safely with use of B handrails with SB/CGA to decrease fall risk.    Baseline  Pt currently trips of the 4 steps he uses to enter/exit his home per wife's report. 3/29: one step at a time with lateral steppage descending 4/23: deferred due to cognition/confusion    Time  8    Period  Weeks    Status  Partially Met    Target Date  10/27/19      Additional Long  Term Goals   Additional Long Term Goals  Yes      PT LONG TERM GOAL #6   Title  Pt will have improved Timed Up and Go score to at least 20 seconds or better, indicating decreased fall risk since initial PT evaluation.    Baseline  4/23: 45 seconds without AD.    Time  8    Period  Weeks    Status  New    Target Date  10/27/19            Plan - 09/14/19 8768    Clinical Impression Statement  Patient demonstrates left hemi neglect throughout session with frequent tactile, verbal, and visual cueing required for proper body mechanics and task orientation. He is able to complete all exercises but requiring heavy cues and performance is somewhat limited due to cognition. Patient will benefit from skilled physical therapy to increase balance, strength, and functional mobility for decreased fall risk.    Personal Factors and Comorbidities  Age;Comorbidity 3+;Fitness;Past/Current Experience;Time since onset of injury/illness/exacerbation    Comorbidities  balance, left homonymous hemianopsia, CKD, HLD, Hypothyroidism, osteoprosis,    Examination-Activity Limitations  Bathing;Caring for Others;Carry;Dressing;Locomotion Level;Lift;Reach Overhead;Squat;Stand;Transfers;Stairs    Examination-Participation Restrictions  Church;Cleaning;Community Activity;Driving;Interpersonal Relationship;Laundry;Volunteer;Shop;School;Meal Prep;Yard Work    [employer]  Evolving/Moderate complexity    Rehab Potential  Fair    PT Frequency  2x / week    PT Duration  8 weeks    PT Treatment/Interventions  ADLs/Self Care Home Management;Aquatic Therapy;Biofeedback;Cryotherapy;Electrical Stimulation;Iontophoresis 27m/ml Dexamethasone;Moist Heat;Traction;Ultrasound;Parrafin;Therapeutic exercise;Therapeutic activities;Functional mobility training;Stair training;Gait training;DME Instruction;Balance training;Neuromuscular re-education;Patient/family education;Orthotic Fit/Training;Cognitive  remediation;Manual techniques;Passive range of motion;Energy conservation;Splinting;Taping;Vestibular;Visual/perceptual remediation/compensation       Patient will benefit from skilled therapeutic intervention in order to improve the following deficits and impairments:  Abnormal gait, Cardiopulmonary status limiting activity, Decreased activity tolerance, Decreased balance, Decreased knowledge of precautions, Decreased endurance, Decreased coordination, Decreased cognition, Decreased knowledge of use of DME, Decreased mobility, Decreased safety awareness, Difficulty walking, Decreased strength, Impaired flexibility, Impaired perceived functional ability, Impaired UE functional use, Impaired tone, Postural dysfunction, Improper body mechanics  Visit Diagnosis: Muscle weakness (generalized)  Difficulty walking     Problem List Patient Active Problem List   Diagnosis Date Noted  . Oropharyngeal dysphagia 06/21/2019  . Moderate protein-calorie malnutrition ([place] 06/21/2019  . Balance disorder 06/21/2019  . Left homonymous hemianopsia 06/21/2019  . Pain due to onychomycosis of toenails of both feet 01/03/2019  . Lewy body dementia without behavioral disturbance ([place] 07/21/2017  . Elevated PSA 12/09/2016  . Vitamin D deficiency 12/09/2016  . Chronic pain of right knee 11/04/2016  . Hyperlipidemia 11/04/2016  . CKD (chronic kidney disease), stage III 11/04/2016  . Osteoarthritis of multiple joints 11/03/2016  . Chronic right hip pain 11/03/2016  . 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 Hypothyroidism 11/03/2014  . Asthma 11/03/2014  . Osteoporosis 11/03/2014  . Skin lesion of chest wall 11/03/2014  . Trigger finger of both hands 11/03/2014   MJanna Arch PT, DPT   09/14/2019, 8:13 AM  [place] [facility] [address] [address]Phone: [phone]  Fax:  [phone] Name: CCARLISS QUASTMRN: [id number] of Birth: 1Sep 17, 1942

## 2019-09-15 ENCOUNTER — Encounter: Payer: Self-pay | Admitting: Podiatry

## 2019-09-15 ENCOUNTER — Ambulatory Visit: Payer: Medicare Other

## 2019-09-15 ENCOUNTER — Other Ambulatory Visit: Payer: Self-pay

## 2019-09-15 ENCOUNTER — Ambulatory Visit: Payer: Medicare Other | Admitting: Podiatry

## 2019-09-15 VITALS — Temp 97.2°F

## 2019-09-15 DIAGNOSIS — M6281 Muscle weakness (generalized): Secondary | ICD-10-CM | POA: Diagnosis not present

## 2019-09-15 DIAGNOSIS — R262 Difficulty in walking, not elsewhere classified: Secondary | ICD-10-CM | POA: Diagnosis not present

## 2019-09-15 DIAGNOSIS — E440 Moderate protein-calorie malnutrition: Secondary | ICD-10-CM | POA: Diagnosis not present

## 2019-09-15 DIAGNOSIS — N183 Chronic kidney disease, stage 3 unspecified: Secondary | ICD-10-CM

## 2019-09-15 DIAGNOSIS — M159 Polyosteoarthritis, unspecified: Secondary | ICD-10-CM | POA: Diagnosis not present

## 2019-09-15 DIAGNOSIS — M79674 Pain in right toe(s): Secondary | ICD-10-CM

## 2019-09-15 DIAGNOSIS — M79675 Pain in left toe(s): Secondary | ICD-10-CM

## 2019-09-15 DIAGNOSIS — B351 Tinea unguium: Secondary | ICD-10-CM | POA: Diagnosis not present

## 2019-09-15 DIAGNOSIS — M810 Age-related osteoporosis without current pathological fracture: Secondary | ICD-10-CM | POA: Diagnosis not present

## 2019-09-15 DIAGNOSIS — R1312 Dysphagia, oropharyngeal phase: Secondary | ICD-10-CM | POA: Diagnosis not present

## 2019-09-15 NOTE — Progress Notes (Signed)
This patient returns to my office for at risk foot care.  This patient requires this care by a professional since this patient will be at risk due to having chronic kidney disease.   This patient is unable to cut nails himself since the patient cannot reach his nails.These nails are painful walking and wearing shoes. He presents to the office with his daughter. This patient presents for at risk foot care today.  General Appearance  Alert, conversant and in no acute stress.  Vascular  Dorsalis pedis and posterior tibial  pulses are weakly  palpable  bilaterally.  Capillary return is within normal limits  bilaterally. Temperature is within normal limits  bilaterally.  Neurologic  Senn-Weinstein monofilament wire test within normal limits  bilaterally. Muscle power within normal limits bilaterally.  Nails Thick disfigured discolored nails with subungual debris  from hallux to fifth toes bilaterally. No evidence of bacterial infection or drainage bilaterally.  Orthopedic  No limitations of motion  feet .  No crepitus or effusions noted.  No bony pathology or digital deformities noted.  HAV  B/L.  Skin  normotropic skin with no porokeratosis noted bilaterally.  No signs of infections or ulcers noted.     Onychomycosis  Pain in right toes  Pain in left toes  Consent was obtained for treatment procedures.   Mechanical debridement of nails 1-5  bilaterally performed with a nail nipper.  Filed with dremel without incident.    Return office visit    3 months                  Told patient to return for periodic foot care and evaluation due to potential at risk complications.   Gardiner Barefoot DPM

## 2019-09-15 NOTE — Therapy (Signed)
[place] MAIN [facility] SERVICES [place], [place], [address] Phone: [phone]   Fax:  [phone]  Physical Therapy Treatment  Patient Details  Name: Blake Bates MRN: [id number] Date of Birth: 1940-11-30 No data recorded  Encounter Date: 09/15/2019  PT End of Session - 09/15/19 1309    Visit Number  19    Number of Visits  32    Date for PT Re-Evaluation  10/27/19    Authorization Type  PN 08/08/19    Authorization - Number of Visits  9    Progress Note Due on Visit  10    PT Start Time  3546    PT Stop Time  1344    PT Time Calculation (min)  45 min    Equipment Utilized During Treatment  Gait belt;Other (comment)   FWW   Behavior During Therapy  WFL for tasks assessed/performed       Past Medical History:  Diagnosis Date  . Asthma   . Diverticulosis   . Full dentures   . Hyperlipidemia   . Hypothyroidism    s/p subtotal thyroidectomy  . Muscle spasms of lower extremity    Right upper leg  . Osteoporosis   . Rhinitis, allergic    pollens, mold, animal dander    Past Surgical History:  Procedure Laterality Date  . CATARACT EXTRACTION W/PHACO Left 09/20/2014   Procedure: CATARACT EXTRACTION PHACO AND INTRAOCULAR LENS PLACEMENT (IOC);  Surgeon: Leandrew Koyanagi, MD;  Location: [place];  Service: Ophthalmology;  Laterality: Left;  . COLONOSCOPY    . PARATHYROIDECTOMY    . THYROID SURGERY     subtotal    There were no vitals filed for this visit.  Subjective Assessment - 09/15/19 1307    Subjective  Patient presnts with wife, no falls or LOB since last session. Has a meeting with [employer] this afternoon. Wife will contact aide agencies next week for home health aid.    Patient is accompained by:  Family member    Pertinent History  Pt has Lewy Body dementia and Parkinson's disease, and has difficulty walking due to weakness and balance deficits, as well as difficulty with motor planning and  decision making.  Pt is a high fall risk and has fallen 5x in the past 2 weeks.    Limitations  Standing;Walking;House hold activities    How long can you sit comfortably?  unlimited    How long can you stand comfortably?  5 minutes    How long can you walk comfortably?  2-3 minutes, must hold onto things due to weakness and imbalance    Patient Stated Goals  "to walk better, and feel stronger"    Currently in Pain?  No/denies              Neuromuscular Re-education cues for safety awareness, CGA for all standing interventions.    airex pad: weighted ball chest press 10x -terminated due to confusion/agitation     ambulate over three therabands on the floor for carryover to home negotiation between rooms/changing surfaces/changing color of flooring. Patient challenged initially, fearful and hesitating but improved with repetition x 6 trials/lenghts of // bars  Step up/down 6" step and consecutive 4" step. 8x length of // bars. Easier to step up/down same color step  Step up/down 4" step and two consecutive hurdles in // bars, requires stabilization of hurdles initially due to difficulty stepping far enough over hurdle for optimal length.   Stand,  lean to grab ball from table on side of // bars, then aim at target, improved aim with improved stabilization of self with pertubation's x 12 balls    TherEx cues for body mechanics, sequencing and task orientation    Nustep Lvl 2 RPM>30 for cardiovascular/musculoskeletal challenge, initially requires assistance to put feet on pedals and start for moment/body mechanics   3lb ankle weights: Marching 4x length of // bars, max cueing for hip  Flexion/foot clearance    Patient requires frequent task orientation and sequencing cues for memory. Use of visual demonstration in combination to verbal cues results in best response. Frequent cues are required for L visual field due to L hemi-neglect.     Pt educated throughout session about proper  posture and technique with exercises. Improved exercise technique, movement at target joints, use of target muscles after min to mod verbal, visual, tactile cues.    Patient has recently been to physician and now is on new medication. Slight improvement in mood is noted throughout session however does come and go with patient's frustration. Maximal cueing is required throughout session for task orientation and sequencing.   Patient demonstrated improved ability to eccentrically control step down from steps in // bars. Noticeable challenge with changing surfaces, with increased difficulty with brown step to white flooring than white step to white flooring. Continued challenge with left side gaze as well as transitioning into/out of transport chair. One verbal outburst when patient was confused. Patient will benefit from skilled physical therapy to increase balance, strength, and functional mobility for decreased fall risk                   PT Education - 09/15/19 1308    Education Details  exercise technique, body mechanics    Person(s) Educated  Patient    Methods  Explanation;Demonstration;Tactile cues;Verbal cues    Comprehension  Verbalized understanding;Returned demonstration;Verbal cues required;Tactile cues required       PT Short Term Goals - 09/02/19 1013      PT SHORT TERM GOAL #1   Title  Pt and caregiver will be educated in a new HEP for overall LE and UE strengthening, and kitchen sink balance exercises to decrease fall risk.    Baseline  No current exercises per caregiver 3/29: occasionally compliant with HEP 4/23: caregiver challenged to help due to patient cognition    Time  2    Period  Weeks    Status  On-going    Target Date  09/16/19      PT SHORT TERM GOAL #2   Title  Pt and wife will be educated in proper use of assistive devices and safety techniques at home (increased lighting, removal of area rugs etc) to decrease fall risk    Baseline  Currently no  education on home equipment and not using assistive devices. 3/29: challenged with compliance with use of AD 4/23: was utilizing rollator prior to sudden cognitive decline    Time  2    Period  Weeks    Status  On-going    Target Date  09/16/19        PT Long Term Goals - 09/02/19 1006      PT LONG TERM GOAL #1   Title  Pt will have increased overall LE gross strength by at least 1/2-1 muscle grade throughout to decrease his fall risk and improve his ability to ambulate further distances safely.    Baseline  Pt currently at 2+ to 3+/5  grossly throughout UE's, and 3 to 4/5 grossly throughout LE's (see objective notes) 3/29: 4-/5 LE strength 4/23: grossly 3+/5    Time  8    Period  Weeks    Status  On-going    Target Date  10/27/19      PT LONG TERM GOAL #2   Title  Pt's caregiver will report no greater than 0-1 falls over 1 week period due to improved balance and strength, and improved saftey techniques including assistive device.    Baseline  Pt currently falling about 5 times over 2-week period 3/29: 1 fall since starting therapy (happened in past week) 4/23: no falls in 3 weeks    Time  8    Period  Weeks    Status  Achieved      PT LONG TERM GOAL #3   Title  Pt will have improved Timed Up and Go score to at least 1 minute or better, indicating decreased fall risk since initial PT evaluation.    Baseline  Pt performed TUG in 1 minute and 30 seconds at initial evaluation. 3/29: 29.7 seconds 4/23: 45 seconds    Time  8    Period  Weeks    Status  Achieved      PT LONG TERM GOAL #4   Title  Pt will have improved 5 Times Sit to Stand score to at least 20 seconds or better, indicating decreased fall risk since time of initial evaluation.    Baseline  Pt performed 5 Times Sit to Stand in 37 seconds at initial evaluation. 3/29; 26.07 seconds w/o UE support (holding onto rainbow ball) 4/23: 20 seconds with hands on knees, unable to perform without UE support.    Time  8    Period   Weeks    Status  Partially Met    Target Date  10/27/19      PT LONG TERM GOAL #5   Title  Pt will ascend/ descend 4 stairs safely with use of B handrails with SB/CGA to decrease fall risk.    Baseline  Pt currently trips of the 4 steps he uses to enter/exit his home per wife's report. 3/29: one step at a time with lateral steppage descending 4/23: deferred due to cognition/confusion    Time  8    Period  Weeks    Status  Partially Met    Target Date  10/27/19      Additional Long Term Goals   Additional Long Term Goals  Yes      PT LONG TERM GOAL #6   Title  Pt will have improved Timed Up and Go score to at least 20 seconds or better, indicating decreased fall risk since initial PT evaluation.    Baseline  4/23: 45 seconds without AD.    Time  8    Period  Weeks    Status  New    Target Date  10/27/19              Patient will benefit from skilled therapeutic intervention in order to improve the following deficits and impairments:     Visit Diagnosis: Muscle weakness (generalized)  Difficulty walking     Problem List Patient Active Problem List   Diagnosis Date Noted  . Oropharyngeal dysphagia 06/21/2019  . Moderate protein-calorie malnutrition ([place]) 06/21/2019  . Balance disorder 06/21/2019  . Left homonymous hemianopsia 06/21/2019  . Pain due to onychomycosis of toenails of both feet 01/03/2019  . Lewy body dementia  without behavioral disturbance ([place]) 07/21/2017  . Elevated PSA 12/09/2016  . Vitamin D deficiency 12/09/2016  . Chronic pain of right knee 11/04/2016  . Hyperlipidemia 11/04/2016  . CKD (chronic kidney disease), stage III 11/04/2016  . Osteoarthritis of multiple joints 11/03/2016  . Chronic right hip pain 11/03/2016  . 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 Hypothyroidism 11/03/2014  . Asthma 11/03/2014  . Osteoporosis 11/03/2014  . Skin lesion of chest wall 11/03/2014  . Trigger finger of both hands 11/03/2014    Janna Arch, PT, DPT   09/15/2019, 2:02 PM  [place] MAIN [facility] SERVICES [address] [place], [place], [address] Phone: [phone]   Fax:  [phone]  Name: Blake Bates MRN: [id number] Date of Birth: 18-Dec-1940

## 2019-09-19 ENCOUNTER — Other Ambulatory Visit: Payer: Self-pay

## 2019-09-19 ENCOUNTER — Ambulatory Visit: Payer: Medicare Other | Admitting: Family Medicine

## 2019-09-19 ENCOUNTER — Ambulatory Visit (INDEPENDENT_AMBULATORY_CARE_PROVIDER_SITE_OTHER): Payer: Medicare Other | Admitting: Family Medicine

## 2019-09-19 ENCOUNTER — Encounter: Payer: Self-pay | Admitting: Family Medicine

## 2019-09-19 ENCOUNTER — Other Ambulatory Visit: Payer: Self-pay | Admitting: Family Medicine

## 2019-09-19 VITALS — BP 127/65 | HR 84 | Temp 97.5°F | Resp 16 | Ht 70.0 in | Wt 137.0 lb

## 2019-09-19 DIAGNOSIS — E782 Mixed hyperlipidemia: Secondary | ICD-10-CM

## 2019-09-19 DIAGNOSIS — E034 Atrophy of thyroid (acquired): Secondary | ICD-10-CM

## 2019-09-19 DIAGNOSIS — E440 Moderate protein-calorie malnutrition: Secondary | ICD-10-CM

## 2019-09-19 DIAGNOSIS — R7309 Other abnormal glucose: Secondary | ICD-10-CM

## 2019-09-19 DIAGNOSIS — E559 Vitamin D deficiency, unspecified: Secondary | ICD-10-CM

## 2019-09-19 DIAGNOSIS — M8949 Other hypertrophic osteoarthropathy, multiple sites: Secondary | ICD-10-CM

## 2019-09-19 DIAGNOSIS — G3183 Dementia with Lewy bodies: Secondary | ICD-10-CM | POA: Diagnosis not present

## 2019-09-19 DIAGNOSIS — M159 Polyosteoarthritis, unspecified: Secondary | ICD-10-CM

## 2019-09-19 DIAGNOSIS — F0280 Dementia in other diseases classified elsewhere without behavioral disturbance: Secondary | ICD-10-CM | POA: Diagnosis not present

## 2019-09-19 DIAGNOSIS — N138 Other obstructive and reflux uropathy: Secondary | ICD-10-CM

## 2019-09-19 DIAGNOSIS — R2689 Other abnormalities of gait and mobility: Secondary | ICD-10-CM

## 2019-09-19 DIAGNOSIS — Z0000 Encounter for general adult medical examination without abnormal findings: Secondary | ICD-10-CM

## 2019-09-19 DIAGNOSIS — N401 Enlarged prostate with lower urinary tract symptoms: Secondary | ICD-10-CM

## 2019-09-19 NOTE — Patient Instructions (Addendum)
Thank you for coming to the office today.  Keep on the trial and error with different foods / textures, as discussed. Let me know message / call if specific questions.   In the meantime, I agree with dose increase on Mirtazapine 7.5 up to 15 - when ready per Dr Delice Lesch.  Can consult a Nutritionist if needed.  DUE for FASTING BLOOD WORK (no food or drink after midnight before the lab appointment, only water or coffee without cream/sugar on the morning of)  SCHEDULE "Lab Only" visit in the morning at the clinic for lab draw in 3 MONTHS   - Make sure Lab Only appointment is at about 1 week before your next appointment, so that results will be available  For Lab Results, once available within 2-3 days of blood draw, you can can log in to MyChart online to view your results and a brief explanation. Also, we can discuss results at next follow-up visit.   Please schedule a Follow-up Appointment to: Return in about 3 months (around 12/20/2019) for Annual Physical.  If you have any other questions or concerns, please feel free to call the office or send a message through [facility]. You may also schedule an earlier appointment if necessary.  Additionally, you may be receiving a survey about your experience at our office within a few days to 1 week by e-mail or mail. We value your feedback.  [name], DO [place]

## 2019-09-19 NOTE — Assessment & Plan Note (Signed)
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

## 2019-09-19 NOTE — Progress Notes (Signed)
Subjective:    Patient ID: Blake Bates, male    DOB: [birth year], 79 y.o.   MRN: [id number]  Blake Bates is a 79 y.o. male presenting on 09/19/2019 for Dysphagia (follow up)  Accompanied by spouse, [name], provides history. Patient has history of dementia.  HPI   Balance Disorder / Imbalance / Left homonymous hemianopia Lewy Body Dementia - See prior detailed HPI prior notes for Lewy Body Dementia likely contributing. Followed by Dr [name] Neurology. Known prior history of CVA affecting as well. Last apt was 09/07/2019. He is managed on Aricept 10mg , and was added Mirtazapine 7.5mg  nightly for improve appetite and also for mood., he has thought to have some parkinsonian features with his gait and stiffness with walking, and also some tremor - He continues on neuro guided PT outpatient now with good results on balance and gait, especially lately after new med mirtazapine has more fluid movement by report Denies any acute confusion, new episodes of wandering, concern for safety, fall or injury  Reduced Appetite / Weight loss / Moderate Protein Calorie Malnutrition Last visit 06/2019 discussed this issue, thought some dysphagia, he was referred to swallow study and ultimately it was negative, no further SLP intervention. He has had some gradual weight loss >6 months, see weight trend, recently in 3 months lost about 6 lbs approx, due to poor nutritional intake primarily. - Main complaint is difficulty with some foods, texture, and doesn't like the way they feel with swallowing at times feels like it can give him an "asthma or allergic" response. However he has not been choking or aspirating or coughing. Has caused confusion and frustration, he still eats cookies and snacks but doesn't eat other more nutritious foods. Not doing meal protein supplement shakes. Seems worse with tougher foods and meats. 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 2/9 09/19/2019 06/21/2019 06/21/2019  Decreased Interest 0 0 0  Down, Depressed, Hopeless 0 0 0  PHQ - 2 Score 0 0 0  Altered sleeping - - -  Tired, decreased energy - - -  Change in appetite - - -  Feeling bad or failure about yourself  - - -  Trouble concentrating - - -  Moving slowly or fidgety/restless - - -  Suicidal thoughts - - -  PHQ-9 Score - - -  Difficult doing work/chores - - -   No flowsheet data found.    Social History   Tobacco Use  . Smoking status: Former Smoker    Packs/day: 0.00    Years: 60.00    Pack years: 0.00    Types: Pipe    Quit date: 05/12/2016    Years since quitting: 3.3  . Smokeless tobacco: Former Systems developer  . Tobacco comment: Self taper down from full pipe daily down to half to quarter pipe  Substance Use Topics  . Alcohol use: Not Currently    Comment: occasionally beer or wine   . Drug use: No    Review of Systems Per HPI unless specifically indicated above     Objective:    BP 127/65   Pulse 84   Temp (!) 97.5 F (36.4 C) (Temporal)   Resp 16  Ht 5\' 10"  (1.778 m)   Wt 137 lb (62.1 kg)   SpO2 100%   BMI 19.66 kg/m   Wt Readings from Last 3 Encounters:  09/19/19 137 lb (62.1 kg)  09/07/19 133 lb 12.8 oz (60.7 kg)  06/21/19 143 lb (64.9 kg)    Physical Exam Vitals and nursing note reviewed.  Constitutional:      General: He is not in acute distress.    Appearance: He is well-developed. He is not diaphoretic.     Comments: Currently well appearing elderly 79 year old male, comfortable, cooperative, thin appearing  HENT:     Head: Normocephalic and atraumatic.     Mouth/Throat:     Mouth: Mucous membranes are moist.     Pharynx: Oropharynx is clear. No  posterior oropharyngeal erythema.  Eyes:     General:        Right eye: No discharge.        Left eye: No discharge.     Conjunctiva/sclera: Conjunctivae normal.     Pupils: Pupils are equal, round, and reactive to light.  Neck:     Thyroid: No thyromegaly.  Cardiovascular:     Rate and Rhythm: Normal rate and regular rhythm.     Heart sounds: Normal heart sounds. No murmur.  Pulmonary:     Effort: Pulmonary effort is normal. No respiratory distress.     Breath sounds: Normal breath sounds. No wheezing or rales.  Abdominal:     General: Bowel sounds are normal. There is no distension.     Palpations: Abdomen is soft. There is no mass.     Tenderness: There is no abdominal tenderness.  Musculoskeletal:        General: No tenderness.     Cervical back: Normal range of motion and neck supple.     Comments: Upper / Lower Extremities: - Normal muscle tone, strength bilateral upper extremities 5/5, lower extremities 5/5  Some stiffness and reduced range of motion with in bilateral lower and upper extremity  Lymphadenopathy:     Cervical: No cervical adenopathy.  Skin:    General: Skin is warm and dry.     Findings: No erythema or rash.  Neurological:     Mental Status: He is alert and oriented to person, place, and time.     Comments: Distal sensation intact to light touch all extremities  Psychiatric:        Behavior: Behavior normal.     Comments: Well groomed, good eye contact, normal speech and thoughts. Follows conversational well and seems more able to participate in conversation.        Results for orders placed or performed in visit on 05/23/19  PSA  Result Value Ref Range   PSA 3.9 < OR = 4.0 ng/mL  BASIC METABOLIC PANEL WITH GFR  Result Value Ref Range   Glucose, Bld 84 65 - 99 mg/dL   BUN 18 7 - 25 mg/dL   Creat 1.42 (H) 0.70 - 1.18 mg/dL   GFR, Est Non African American 47 (L) > OR = 60 mL/min/1.26m2   GFR, Est African American 54 (L) > OR = 60 mL/min/1.44m2     BUN/Creatinine Ratio 13 6 - 22 (calc)   Sodium 138 135 - 146 mmol/L   Potassium 4.2 3.5 - 5.3 mmol/L   Chloride 101 98 - 110 mmol/L   CO2 30 20 - 32 mmol/L   Calcium 9.2 8.6 - 10.3 mg/dL      Assessment & Plan:  Problem List Items Addressed This Visit    Moderate protein-calorie malnutrition ([place]) - Primary    Secondary to poor nutrition appetite Seems unlikely dysphagia issue now, s.p modified barium swallow, no further SLP involvement  No other GI red flag symptoms that would explain his malnutrition weight loss, seems to not be taking in as much still due to patient preferences and food textures.  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   Follow up plan: Return in about 3 months (around 12/20/2019) for Annual Physical.  Future labs ordered for 12/2019  [name], [facility] 09/19/2019, 2:14 PM

## 2019-09-19 NOTE — Assessment & Plan Note (Signed)
Secondary to poor nutrition appetite Seems unlikely dysphagia issue now, s.p modified barium swallow, no further SLP involvement  No other GI red flag symptoms that would explain his malnutrition weight loss, seems to not be taking in as much still due to patient preferences and food textures.  Advised several ways to improve nutrition and calorie intake, and otherwise may consider refer to Nutrition if indicated as next step  Agree inc mirtazapine 7.5 to 15mg  to help improve appetite

## 2019-09-19 NOTE — Assessment & Plan Note (Signed)
Secondary to Lewy Body Dementia / parkinsonian features Improving on PT regimen

## 2019-09-20 ENCOUNTER — Ambulatory Visit: Payer: Medicare Other

## 2019-09-21 ENCOUNTER — Other Ambulatory Visit: Payer: Self-pay | Admitting: Neurology

## 2019-09-21 MED ORDER — MIRTAZAPINE 15 MG PO TABS: 15.0000 mg | ORAL_TABLET | Freq: Every day | ORAL | 11 refills | Status: DC

## 2019-09-22 ENCOUNTER — Ambulatory Visit: Payer: Medicare Other

## 2019-09-22 ENCOUNTER — Other Ambulatory Visit: Payer: Self-pay

## 2019-09-22 DIAGNOSIS — R262 Difficulty in walking, not elsewhere classified: Secondary | ICD-10-CM | POA: Diagnosis not present

## 2019-09-22 DIAGNOSIS — M159 Polyosteoarthritis, unspecified: Secondary | ICD-10-CM | POA: Diagnosis not present

## 2019-09-22 DIAGNOSIS — R1312 Dysphagia, oropharyngeal phase: Secondary | ICD-10-CM | POA: Diagnosis not present

## 2019-09-22 DIAGNOSIS — E440 Moderate protein-calorie malnutrition: Secondary | ICD-10-CM | POA: Diagnosis not present

## 2019-09-22 DIAGNOSIS — M810 Age-related osteoporosis without current pathological fracture: Secondary | ICD-10-CM | POA: Diagnosis not present

## 2019-09-22 DIAGNOSIS — M6281 Muscle weakness (generalized): Secondary | ICD-10-CM | POA: Diagnosis not present

## 2019-09-22 DIAGNOSIS — N183 Chronic kidney disease, stage 3 unspecified: Secondary | ICD-10-CM | POA: Diagnosis not present

## 2019-09-22 NOTE — Therapy (Signed)
[place] MAIN [facility] SERVICES [address] [place], [place], [address] Phone: [phone]   Fax:  [phone]  Physical Therapy Treatment Physical Therapy Progress Note   Dates of reporting period  08/08/19  to  09/22/19  Patient Details  Name: Blake Bates MRN: [id number] Date of Birth: [birth year] No data recorded  Encounter Date: 09/22/2019  PT End of Session - 09/22/19 1357    Visit Number  20    Number of Visits  32    Date for PT Re-Evaluation  10/27/19    Authorization Type  next session 1/10 PN 09/22/19    PT Start Time  1300    PT Stop Time  1346    PT Time Calculation (min)  46 min    Equipment Utilized During Treatment  Gait belt;Other (comment)   FWW   Behavior During Therapy  WFL for tasks assessed/performed       Past Medical History:  Diagnosis Date  . Asthma   . Diverticulosis   . Full dentures   . Hyperlipidemia   . Hypothyroidism    s/p subtotal thyroidectomy  . Muscle spasms of lower extremity    Right upper leg  . Osteoporosis   . Rhinitis, allergic    pollens, mold, animal dander    Past Surgical History:  Procedure Laterality Date  . CATARACT EXTRACTION W/PHACO Left 09/20/2014   Procedure: CATARACT EXTRACTION PHACO AND INTRAOCULAR LENS PLACEMENT (IOC);  Surgeon: Leandrew Koyanagi, MD;  Location: [facility];  Service: Ophthalmology;  Laterality: Left;  . COLONOSCOPY    . PARATHYROIDECTOMY    . THYROID SURGERY     subtotal    There were no vitals filed for this visit.  Subjective Assessment - 09/22/19 1308    Subjective  Patient presents with wife. Saw physician earlier this week. Started medication for mood.    Patient is accompained by:  Family member    Pertinent History  Pt has Lewy Body dementia and Parkinson's disease, and has difficulty walking due to weakness and balance deficits, as well as difficulty with motor planning and decision making.  Pt is a high fall risk and has  fallen 5x in the past 2 weeks.    Limitations  Standing;Walking;House hold activities    How long can you sit comfortably?  unlimited    How long can you stand comfortably?  5 minutes    How long can you walk comfortably?  2-3 minutes, must hold onto things due to weakness and imbalance    Patient Stated Goals  "to walk better, and feel stronger"    Currently in Pain?  No/denies            Progress note Goals: FOTO: 40 Use of AD: learning use of AD ; intermittently using at home to walk to mailbox  5x STS: 17 seconds with BUE support, 16 seconds with hands on knees, didn't raise to full stand on 5th.  Stairs TUG: 56 seconds with two episodes of confusion requiring cueing for task orientation ; 49 seconds with cone: use of RW; very challenging with turning.   Treatment:  Neuromuscular Re-educationcues for safety awareness, CGA for all standing interventions. Weave between 5 cones with min mod A for RW negotiation,  x2 trials    reach laterally to grab ball, toss into hoop, cues to bring self closer to hoop, able to reach up to "dunk" ball in with increasing upright posture x 20   Step up/down 6"  step and consecutive 4" step. 6x length of // bars. Improved ability to step up with SLE   TherEx cues for body mechanics, sequencing and task orientation Ambulate 120 ft with RW and Min A for negotiation of RW.      Patient requires frequent task orientation and sequencing cues for memory. Use of visual demonstration in combination to verbal cues results in best response. Frequent cues are required for L visual field due to L hemi-neglect.  Pt educated throughout session about proper posture and technique with exercises. Improved exercise technique, movement at target joints, use of target muscles after min to mod verbal, visual, tactile cues.    Patient demonstratesleft hemi neglect throughout session with frequent tactile, verbal, and visual cueing required for proper  body mechanics and task orientation.He is able to complete all exercises but requiring heavy cues and performance is somewhat limited    Patient's condition has the potential to improve in response to therapy. Maximum improvement is yet to be obtained. The anticipated improvement is attainable and reasonable in a generally predictable time.  Patient's wife reports patient is starting to do better at home. He is having less frequent falls and is walking to the mailbox more frequently.              PT Education - 09/22/19 1309    Education Details  goals, body mechanics    Person(s) Educated  Patient;Spouse    Methods  Explanation;Demonstration;Tactile cues;Verbal cues    Comprehension  Verbalized understanding;Returned demonstration;Verbal cues required;Tactile cues required       PT Short Term Goals - 09/22/19 1359      PT SHORT TERM GOAL #1   Title  Pt and caregiver will be educated in a new HEP for overall LE and UE strengthening, and kitchen sink balance exercises to decrease fall risk.    Baseline  No current exercises per caregiver 3/29: occasionally compliant with HEP 4/23: caregiver challenged to help due to patient cognition 5/13: improving with compliance    Time  2    Period  Weeks    Status  Partially Met    Target Date  10/06/19      PT SHORT TERM GOAL #2   Title  Pt and wife will be educated in proper use of assistive devices and safety techniques at home (increased lighting, removal of area rugs etc) to decrease fall risk    Baseline  Currently no education on home equipment and not using assistive devices. 3/29: challenged with compliance with use of AD 4/23: was utilizing rollator prior to sudden cognitive decline 5/13: utilizing RW occasionally    Time  2    Period  Weeks    Status  Partially Met    Target Date  10/06/19        PT Long Term Goals - 09/22/19 1401      PT LONG TERM GOAL #1   Title  Pt will have increased overall LE gross strength by at  least 1/2-1 muscle grade throughout to decrease his fall risk and improve his ability to ambulate further distances safely.    Baseline  Pt currently at 2+ to 3+/5 grossly throughout UE's, and 3 to 4/5 grossly throughout LE's (see objective notes) 3/29: 4-/5 LE strength 4/23: grossly 3+/5; 5/13: grossly 4-/5    Time  8    Period  Weeks    Status  Partially Met    Target Date  10/27/19      PT LONG  TERM GOAL #2   Title  Pt's caregiver will report no greater than 0-1 falls over 1 week period due to improved balance and strength, and improved saftey techniques including assistive device.    Baseline  Pt currently falling about 5 times over 2-week period 3/29: 1 fall since starting therapy (happened in past week) 4/23: no falls in 3 weeks    Time  8    Period  Weeks    Status  Achieved      PT LONG TERM GOAL #3   Title  Pt will have improved Timed Up and Go score to at least 1 minute or better, indicating decreased fall risk since initial PT evaluation.    Baseline  Pt performed TUG in 1 minute and 30 seconds at initial evaluation. 3/29: 29.7 seconds 4/23: 45 seconds    Time  8    Period  Weeks    Status  Achieved      PT LONG TERM GOAL #4   Title  Pt will have improved 5 Times Sit to Stand score to at least 20 seconds or better, indicating decreased fall risk since time of initial evaluation.    Baseline  Pt performed 5 Times Sit to Stand in 37 seconds at initial evaluation. 3/29; 26.07 seconds w/o UE support (holding onto rainbow ball) 4/23: 20 seconds with hands on knees, unable to perform without UE support. 5/13: 16 seconds with hands on knees, not able to fully stand final stand    Time  8    Period  Weeks    Status  Partially Met    Target Date  10/27/19      PT LONG TERM GOAL #5   Title  Pt will ascend/ descend 4 stairs safely with use of B handrails with SB/CGA to decrease fall risk.    Baseline  Pt currently trips of the 4 steps he uses to enter/exit his home per wife's report.  3/29: one step at a time with lateral steppage descending 4/23: deferred due to cognition/confusion    Time  8    Period  Weeks    Status  Partially Met    Target Date  10/27/19      PT LONG TERM GOAL #6   Title  Pt will have improved Timed Up and Go score to at least 20 seconds or better, indicating decreased fall risk since initial PT evaluation.    Baseline  4/23: 45 seconds without AD. 5/13; 49 seconds with RW    Time  8    Period  Weeks    Status  On-going    Target Date  10/27/19            Plan - 09/22/19 1404    Clinical Impression Statement  Patient is progressing towards goals with increased strength, transfer speed, and mobility. Negotiating obstacles with RW is very challenging at this time to patient due to visual field neglect. Continued focus on training with AD for stability and safety awareness will benefit patient at this time.  Patient's condition has the potential to improve in response to therapy. Maximum improvement is yet to be obtained. The anticipated improvement is attainable and reasonable in a generally predictable time.    Personal Factors and Comorbidities  Age;Comorbidity 3+;Fitness;Past/Current Experience;Time since onset of injury/illness/exacerbation    Comorbidities  balance, left homonymous hemianopsia, CKD, HLD, Hypothyroidism, osteoprosis,    Examination-Activity Limitations  Bathing;Caring for Others;Carry;Dressing;Locomotion Level;Lift;Reach Overhead;Squat;Stand;Transfers;Stairs    Examination-Participation Restrictions  Church;Cleaning;Community Activity;Driving;Interpersonal  Relationship;Laundry;Volunteer;Shop;School;Meal Prep;Yard Work    [employer] officer  Evolving/Moderate complexity    Rehab Potential  Fair    PT Frequency  2x / week    PT Duration  8 weeks    PT Treatment/Interventions  ADLs/Self Care Home Management;Aquatic Therapy;Biofeedback;Cryotherapy;Electrical Stimulation;Iontophoresis 37m/ml Dexamethasone;Moist  Heat;Traction;Ultrasound;Parrafin;Therapeutic exercise;Therapeutic activities;Functional mobility training;Stair training;Gait training;DME Instruction;Balance training;Neuromuscular re-education;Patient/family education;Orthotic Fit/Training;Cognitive remediation;Manual techniques;Passive range of motion;Energy conservation;Splinting;Taping;Vestibular;Visual/perceptual remediation/compensation       Patient will benefit from skilled therapeutic intervention in order to improve the following deficits and impairments:  Abnormal gait, Cardiopulmonary status limiting activity, Decreased activity tolerance, Decreased balance, Decreased knowledge of precautions, Decreased endurance, Decreased coordination, Decreased cognition, Decreased knowledge of use of DME, Decreased mobility, Decreased safety awareness, Difficulty walking, Decreased strength, Impaired flexibility, Impaired perceived functional ability, Impaired UE functional use, Impaired tone, Postural dysfunction, Improper body mechanics  Visit Diagnosis: Muscle weakness (generalized)  Difficulty walking     Problem List Patient Active Problem List   Diagnosis Date Noted  . Oropharyngeal dysphagia 06/21/2019  . Moderate protein-calorie malnutrition ([place] 06/21/2019  . Balance disorder 06/21/2019  . Left homonymous hemianopsia 06/21/2019  . Pain due to onychomycosis of toenails of both feet 01/03/2019  . Lewy body dementia without behavioral disturbance ([place] 07/21/2017  . Elevated PSA 12/09/2016  . Vitamin D deficiency 12/09/2016  . Chronic pain of right knee 11/04/2016  . Hyperlipidemia 11/04/2016  . CKD (chronic kidney disease), stage III 11/04/2016  . Osteoarthritis of multiple joints 11/03/2016  . Chronic right hip pain 11/03/2016  . 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 Hypothyroidism 11/03/2014  . Asthma 11/03/2014  . Osteoporosis 11/03/2014  . Skin lesion of chest wall 11/03/2014  . Trigger finger  of both hands 11/03/2014   MJanna Arch PT, DPT   09/22/2019, 2:05 PM  [place] [address]RTat[address] [place] [address]Phone: [phone]  Fax:  [phone] Name: CCHRISTIAAN STREBECKMRN: [id number] of Birth: [birth year]

## 2019-09-27 ENCOUNTER — Ambulatory Visit: Payer: Medicare Other

## 2019-09-27 ENCOUNTER — Other Ambulatory Visit: Payer: Self-pay

## 2019-09-27 DIAGNOSIS — R1312 Dysphagia, oropharyngeal phase: Secondary | ICD-10-CM | POA: Diagnosis not present

## 2019-09-27 DIAGNOSIS — M810 Age-related osteoporosis without current pathological fracture: Secondary | ICD-10-CM | POA: Diagnosis not present

## 2019-09-27 DIAGNOSIS — R262 Difficulty in walking, not elsewhere classified: Secondary | ICD-10-CM | POA: Diagnosis not present

## 2019-09-27 DIAGNOSIS — M159 Polyosteoarthritis, unspecified: Secondary | ICD-10-CM | POA: Diagnosis not present

## 2019-09-27 DIAGNOSIS — M6281 Muscle weakness (generalized): Secondary | ICD-10-CM

## 2019-09-27 DIAGNOSIS — E440 Moderate protein-calorie malnutrition: Secondary | ICD-10-CM | POA: Diagnosis not present

## 2019-09-27 DIAGNOSIS — N183 Chronic kidney disease, stage 3 unspecified: Secondary | ICD-10-CM | POA: Diagnosis not present

## 2019-09-27 NOTE — Therapy (Signed)
[place] MAIN [facility] SERVICES [address] [place], [place], [address] Phone: [phone]   Fax:  [phone]  Physical Therapy Treatment  Patient Details  Name: Blake Bates MRN: [id number] Date of Birth: [birth year] No data recorded  Encounter Date: 09/27/2019  PT End of Session - 09/27/19 1353    Visit Number  21    Number of Visits  32    Date for PT Re-Evaluation  10/27/19    Authorization Type  next session 2/10 PN 09/22/19    PT Start Time  1259    PT Stop Time  1344    PT Time Calculation (min)  45 min    Equipment Utilized During Treatment  Gait belt;Other (comment)   FWW   Behavior During Therapy  WFL for tasks assessed/performed       Past Medical History:  Diagnosis Date  . Asthma   . Diverticulosis   . Full dentures   . Hyperlipidemia   . Hypothyroidism    s/p subtotal thyroidectomy  . Muscle spasms of lower extremity    Right upper leg  . Osteoporosis   . Rhinitis, allergic    pollens, mold, animal dander    Past Surgical History:  Procedure Laterality Date  . CATARACT EXTRACTION W/PHACO Left 09/20/2014   Procedure: CATARACT EXTRACTION PHACO AND INTRAOCULAR LENS PLACEMENT (IOC);  Surgeon: Leandrew Koyanagi, MD;  Location: [place];  Service: Ophthalmology;  Laterality: Left;  . COLONOSCOPY    . PARATHYROIDECTOMY    . THYROID SURGERY     subtotal    There were no vitals filed for this visit.  Subjective Assessment - 09/27/19 1352    Subjective  Patient presents with wife. Reports they don't have an aide yet. Still is having issues with food/appetite.    Patient is accompained by:  Family member    Pertinent History  Pt has Lewy Body dementia and Parkinson's disease, and has difficulty walking due to weakness and balance deficits, as well as difficulty with motor planning and decision making.  Pt is a high fall risk and has fallen 5x in the past 2 weeks.    Limitations  Standing;Walking;House  hold activities    How long can you sit comfortably?  unlimited    How long can you stand comfortably?  5 minutes    How long can you walk comfortably?  2-3 minutes, must hold onto things due to weakness and imbalance    Patient Stated Goals  "to walk better, and feel stronger"    Currently in Pain?  No/denies          Treatment:  Neuromuscular Re-education cues for safety awareness, CGA for all standing interventions.   reach laterally to grab ball, toss into hoop, cues to bring self closer to hoop, able to reach up to "dunk" ball in with increasing upright posture x 20  stair negotiation: use of stick notes for visual cue of foot placement, max cueing for sequencing, hand placement on railing, and foot placement. Descending patient is very unstable with LLE externally rotated despite multimodal cueing. X 3 trials up/down     TherEx cues for body mechanics, sequencing and task orientation  Ambulate 96 ft with RW and Min A for negotiation of RW and object negotiation, max cueing for sequencing. Decreased challenge with turning to the right than to the left. X 4 trials with seated rest breaks 2.5 ankle weight seated: -LAQ to PT hand for visual cue 10x each  LE -hip flexion to PT hand for visual cue 10x each LE -heel raises 15x -toe raises 15x    RTB adduction to PT hand 12x each LE  Patient requires frequent task orientation and sequencing cues for memory. Use of visual demonstration in combination to verbal cues results in best response. Frequent cues are required for L visual field due to L hemi-neglect.     Pt educated throughout session about proper posture and technique with exercises. Improved exercise technique, movement at target joints, use of target muscles after min to mod verbal, visual, tactile cues.                           PT Education - 09/27/19 1352    Education Details  exercise technique, stair negotiation    Person(s) Educated  Patient     Methods  Explanation;Demonstration;Tactile cues;Verbal cues    Comprehension  Verbalized understanding;Returned demonstration;Tactile cues required;Verbal cues required       PT Short Term Goals - 09/22/19 1359      PT SHORT TERM GOAL #1   Title  Pt and caregiver will be educated in a new HEP for overall LE and UE strengthening, and kitchen sink balance exercises to decrease fall risk.    Baseline  No current exercises per caregiver 3/29: occasionally compliant with HEP 4/23: caregiver challenged to help due to patient cognition 5/13: improving with compliance    Time  2    Period  Weeks    Status  Partially Met    Target Date  10/06/19      PT SHORT TERM GOAL #2   Title  Pt and wife will be educated in proper use of assistive devices and safety techniques at home (increased lighting, removal of area rugs etc) to decrease fall risk    Baseline  Currently no education on home equipment and not using assistive devices. 3/29: challenged with compliance with use of AD 4/23: was utilizing rollator prior to sudden cognitive decline 5/13: utilizing RW occasionally    Time  2    Period  Weeks    Status  Partially Met    Target Date  10/06/19        PT Long Term Goals - 09/22/19 1401      PT LONG TERM GOAL #1   Title  Pt will have increased overall LE gross strength by at least 1/2-1 muscle grade throughout to decrease his fall risk and improve his ability to ambulate further distances safely.    Baseline  Pt currently at 2+ to 3+/5 grossly throughout UE's, and 3 to 4/5 grossly throughout LE's (see objective notes) 3/29: 4-/5 LE strength 4/23: grossly 3+/5; 5/13: grossly 4-/5    Time  8    Period  Weeks    Status  Partially Met    Target Date  10/27/19      PT LONG TERM GOAL #2   Title  Pt's caregiver will report no greater than 0-1 falls over 1 week period due to improved balance and strength, and improved saftey techniques including assistive device.    Baseline  Pt currently falling  about 5 times over 2-week period 3/29: 1 fall since starting therapy (happened in past week) 4/23: no falls in 3 weeks    Time  8    Period  Weeks    Status  Achieved      PT LONG TERM GOAL #3   Title  Pt  will have improved Timed Up and Go score to at least 1 minute or better, indicating decreased fall risk since initial PT evaluation.    Baseline  Pt performed TUG in 1 minute and 30 seconds at initial evaluation. 3/29: 29.7 seconds 4/23: 45 seconds    Time  8    Period  Weeks    Status  Achieved      PT LONG TERM GOAL #4   Title  Pt will have improved 5 Times Sit to Stand score to at least 20 seconds or better, indicating decreased fall risk since time of initial evaluation.    Baseline  Pt performed 5 Times Sit to Stand in 37 seconds at initial evaluation. 3/29; 26.07 seconds w/o UE support (holding onto rainbow ball) 4/23: 20 seconds with hands on knees, unable to perform without UE support. 5/13: 16 seconds with hands on knees, not able to fully stand final stand    Time  8    Period  Weeks    Status  Partially Met    Target Date  10/27/19      PT LONG TERM GOAL #5   Title  Pt will ascend/ descend 4 stairs safely with use of B handrails with SB/CGA to decrease fall risk.    Baseline  Pt currently trips of the 4 steps he uses to enter/exit his home per wife's report. 3/29: one step at a time with lateral steppage descending 4/23: deferred due to cognition/confusion    Time  8    Period  Weeks    Status  Partially Met    Target Date  10/27/19      PT LONG TERM GOAL #6   Title  Pt will have improved Timed Up and Go score to at least 20 seconds or better, indicating decreased fall risk since initial PT evaluation.    Baseline  4/23: 45 seconds without AD. 5/13; 49 seconds with RW    Time  8    Period  Weeks    Status  On-going    Target Date  10/27/19            Plan - 09/27/19 1355    Clinical Impression Statement  Patient is able to tolerate prolonged ambulation duration  this session with continued need for Min A for walker sequencing/negotiation. Use of visual cues help patient with foot placement and arc of movement to allow for improved body mechanics and strengthening interventions. Patient will benefit from skilled physical therapy to increase balance, strength, and functional mobility for decreased fall risk.    Personal Factors and Comorbidities  Age;Comorbidity 3+;Fitness;Past/Current Experience;Time since onset of injury/illness/exacerbation    Comorbidities  balance, left homonymous hemianopsia, CKD, HLD, Hypothyroidism, osteoprosis,    Examination-Activity Limitations  Bathing;Caring for Others;Carry;Dressing;Locomotion Level;Lift;Reach Overhead;Squat;Stand;Transfers;Stairs    Examination-Participation Restrictions  Church;Cleaning;Community Activity;Driving;Interpersonal Relationship;Laundry;Volunteer;Shop;School;Meal Prep;Yard Work    [employer] officer  Evolving/Moderate complexity    Rehab Potential  Fair    PT Frequency  2x / week    PT Duration  8 weeks    PT Treatment/Interventions  ADLs/Self Care Home Management;Aquatic Therapy;Biofeedback;Cryotherapy;Electrical Stimulation;Iontophoresis 70m/ml Dexamethasone;Moist Heat;Traction;Ultrasound;Parrafin;Therapeutic exercise;Therapeutic activities;Functional mobility training;Stair training;Gait training;DME Instruction;Balance training;Neuromuscular re-education;Patient/family education;Orthotic Fit/Training;Cognitive remediation;Manual techniques;Passive range of motion;Energy conservation;Splinting;Taping;Vestibular;Visual/perceptual remediation/compensation       Patient will benefit from skilled therapeutic intervention in order to improve the following deficits and impairments:  Abnormal gait, Cardiopulmonary status limiting activity, Decreased activity tolerance, Decreased balance, Decreased knowledge of precautions, Decreased endurance, Decreased coordination, Decreased cognition,  Decreased knowledge of use of DME, Decreased mobility, Decreased safety awareness, Difficulty walking, Decreased strength, Impaired flexibility, Impaired perceived functional ability, Impaired UE functional use, Impaired tone, Postural dysfunction, Improper body mechanics  Visit Diagnosis: Muscle weakness (generalized)  Difficulty walking     Problem List Patient Active Problem List   Diagnosis Date Noted  . Oropharyngeal dysphagia 06/21/2019  . Moderate protein-calorie malnutrition ([place]) 06/21/2019  . Balance disorder 06/21/2019  . Left homonymous hemianopsia 06/21/2019  . Pain due to onychomycosis of toenails of both feet 01/03/2019  . Lewy body dementia without behavioral disturbance ([place]) 07/21/2017  . Elevated PSA 12/09/2016  . Vitamin D deficiency 12/09/2016  . Chronic pain of right knee 11/04/2016  . Hyperlipidemia 11/04/2016  . CKD (chronic kidney disease), stage III 11/04/2016  . Osteoarthritis of multiple joints 11/03/2016  . Chronic right hip pain 11/03/2016  . 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 Hypothyroidism 11/03/2014  . Asthma 11/03/2014  . Osteoporosis 11/03/2014  . Skin lesion of chest wall 11/03/2014  . Trigger finger of both hands 11/03/2014   Janna Arch, PT, DPT   09/27/2019, 1:57 PM  [place] [facility] MAIN [facility] SERVICES [address] [place], [place], [address] Phone: [phone]   Fax:  [phone]  Name: Blake Bates MRN: [id number] Date of Birth: December 21, 1940

## 2019-09-29 ENCOUNTER — Ambulatory Visit: Payer: Medicare Other

## 2019-10-04 ENCOUNTER — Other Ambulatory Visit: Payer: Self-pay

## 2019-10-04 ENCOUNTER — Ambulatory Visit: Payer: Medicare Other

## 2019-10-04 DIAGNOSIS — M6281 Muscle weakness (generalized): Secondary | ICD-10-CM

## 2019-10-04 DIAGNOSIS — M810 Age-related osteoporosis without current pathological fracture: Secondary | ICD-10-CM | POA: Diagnosis not present

## 2019-10-04 DIAGNOSIS — M159 Polyosteoarthritis, unspecified: Secondary | ICD-10-CM | POA: Diagnosis not present

## 2019-10-04 DIAGNOSIS — R262 Difficulty in walking, not elsewhere classified: Secondary | ICD-10-CM | POA: Diagnosis not present

## 2019-10-04 DIAGNOSIS — N183 Chronic kidney disease, stage 3 unspecified: Secondary | ICD-10-CM | POA: Diagnosis not present

## 2019-10-04 DIAGNOSIS — E440 Moderate protein-calorie malnutrition: Secondary | ICD-10-CM | POA: Diagnosis not present

## 2019-10-04 DIAGNOSIS — R1312 Dysphagia, oropharyngeal phase: Secondary | ICD-10-CM | POA: Diagnosis not present

## 2019-10-04 NOTE — Therapy (Signed)
[place] MAIN [facility] SERVICES [address] [place], [place], [address] Phone: [phone]   Fax:  [phone]  Physical Therapy Treatment  Patient Details  Name: Blake Bates MRN: [id number] Date of Birth: [birth year] No data recorded  Encounter Date: 10/04/2019  PT End of Session - 10/04/19 1308    Visit Number  22    Number of Visits  32    Date for PT Re-Evaluation  10/27/19    PT Start Time  1301    PT Stop Time  1341    PT Time Calculation (min)  40 min    Equipment Utilized During Treatment  Gait belt;Other (comment)    Behavior During Therapy  WFL for tasks assessed/performed       Past Medical History:  Diagnosis Date  . Asthma   . Diverticulosis   . Full dentures   . Hyperlipidemia   . Hypothyroidism    s/p subtotal thyroidectomy  . Muscle spasms of lower extremity    Right upper leg  . Osteoporosis   . Rhinitis, allergic    pollens, mold, animal dander    Past Surgical History:  Procedure Laterality Date  . CATARACT EXTRACTION W/PHACO Left 09/20/2014   Procedure: CATARACT EXTRACTION PHACO AND INTRAOCULAR LENS PLACEMENT (IOC);  Surgeon: Leandrew Koyanagi, MD;  Location: [place];  Service: Ophthalmology;  Laterality: Left;  . COLONOSCOPY    . PARATHYROIDECTOMY    . THYROID SURGERY     subtotal    There were no vitals filed for this visit.  Subjective Assessment - 10/04/19 1307    Subjective  Pt reports feelign good today, no recent updates, no recent falls, no meds changes, MD visits.    Patient is accompained by:  Family member    Pertinent History  Pt has Lewy Body dementia and Parkinson's disease, and has difficulty walking due to weakness and balance deficits, as well as difficulty with motor planning and decision making.  Pt is a high fall risk and has fallen 5x in the past 2 weeks.    Limitations  Standing;Walking;House hold activities    Currently in Pain?  No/denies         INTERVENTION THIS DATE:  -AMB intervals in hallway 2x17f, then 1x737f pt cued for speed and large amplitude (difficulty maintaining  -STS from elevated surface + RW 2x10  -Self toss/catch with B ball, 1x20, very limited -Same as above but author generates vertical toss, pt catches x15 (improved explosive movements and catch -Single UE rech to floor, + RUE throw to cone on stool 1x (basket of balls each side) each side; postural cues for thoracic extension to facilitate improved cocking position of RUE and improved force.      PT Short Term Goals - 09/22/19 1359      PT SHORT TERM GOAL #1   Title  Pt and caregiver will be educated in a new HEP for overall LE and UE strengthening, and kitchen sink balance exercises to decrease fall risk.    Baseline  No current exercises per caregiver 3/29: occasionally compliant with HEP 4/23: caregiver challenged to help due to patient cognition 5/13: improving with compliance    Time  2    Period  Weeks    Status  Partially Met    Target Date  10/06/19      PT SHORT TERM GOAL #2   Title  Pt and wife will be educated in proper use of assistive devices and safety  techniques at home (increased lighting, removal of area rugs etc) to decrease fall risk    Baseline  Currently no education on home equipment and not using assistive devices. 3/29: challenged with compliance with use of AD 4/23: was utilizing rollator prior to sudden cognitive decline 5/13: utilizing RW occasionally    Time  2    Period  Weeks    Status  Partially Met    Target Date  10/06/19        PT Long Term Goals - 09/22/19 1401      PT LONG TERM GOAL #1   Title  Pt will have increased overall LE gross strength by at least 1/2-1 muscle grade throughout to decrease his fall risk and improve his ability to ambulate further distances safely.    Baseline  Pt currently at 2+ to 3+/5 grossly throughout UE's, and 3 to 4/5 grossly throughout LE's (see objective notes) 3/29: 4-/5 LE  strength 4/23: grossly 3+/5; 5/13: grossly 4-/5    Time  8    Period  Weeks    Status  Partially Met    Target Date  10/27/19      PT LONG TERM GOAL #2   Title  Pt's caregiver will report no greater than 0-1 falls over 1 week period due to improved balance and strength, and improved saftey techniques including assistive device.    Baseline  Pt currently falling about 5 times over 2-week period 3/29: 1 fall since starting therapy (happened in past week) 4/23: no falls in 3 weeks    Time  8    Period  Weeks    Status  Achieved      PT LONG TERM GOAL #3   Title  Pt will have improved Timed Up and Go score to at least 1 minute or better, indicating decreased fall risk since initial PT evaluation.    Baseline  Pt performed TUG in 1 minute and 30 seconds at initial evaluation. 3/29: 29.7 seconds 4/23: 45 seconds    Time  8    Period  Weeks    Status  Achieved      PT LONG TERM GOAL #4   Title  Pt will have improved 5 Times Sit to Stand score to at least 20 seconds or better, indicating decreased fall risk since time of initial evaluation.    Baseline  Pt performed 5 Times Sit to Stand in 37 seconds at initial evaluation. 3/29; 26.07 seconds w/o UE support (holding onto rainbow ball) 4/23: 20 seconds with hands on knees, unable to perform without UE support. 5/13: 16 seconds with hands on knees, not able to fully stand final stand    Time  8    Period  Weeks    Status  Partially Met    Target Date  10/27/19      PT LONG TERM GOAL #5   Title  Pt will ascend/ descend 4 stairs safely with use of B handrails with SB/CGA to decrease fall risk.    Baseline  Pt currently trips of the 4 steps he uses to enter/exit his home per wife's report. 3/29: one step at a time with lateral steppage descending 4/23: deferred due to cognition/confusion    Time  8    Period  Weeks    Status  Partially Met    Target Date  10/27/19      PT LONG TERM GOAL #6   Title  Pt will have improved Timed Up and Go score  to at least 20 seconds or better, indicating decreased fall risk since initial PT evaluation.    Baseline  4/23: 45 seconds without AD. 5/13; 49 seconds with RW    Time  8    Period  Weeks    Status  On-going    Target Date  10/27/19            Plan - 10/04/19 1309    Clinical Impression Statement Pt able to complete entire session as planned with rest breaks provided as needed. Pt maintains high level of focus and motivation. Pt denies need for extensive rest breaks but appears to struggle with consistent level of exertion in mobility >35-75f, unclear if it is a ramification of difficulty with attention or true onset fatigue. Similar is said for loss of upright standing extension. Extensive verbal, visual, and tactile cues are provided for most accurate form possible. Extensive safety cues for on/off transport chair and safe use of RW. VSS in session. Overall pt continues to make steady progress toward treatment goals.    Personal Factors and Comorbidities  Age;Comorbidity 3+;Fitness;Past/Current Experience;Time since onset of injury/illness/exacerbation    Comorbidities  balance, left homonymous hemianopsia, CKD, HLD, Hypothyroidism, osteoprosis,    Examination-Activity Limitations  Bathing;Caring for Others;Carry;Dressing;Locomotion Level;Lift;Reach Overhead;Squat;Stand;Transfers;Stairs    Examination-Participation Restrictions  Church;Cleaning;Community Activity;Driving;Interpersonal Relationship;Laundry;Volunteer;Shop;School;Meal Prep;Yard Work    SMerchant navy officer Evolving/Moderate complexity    Clinical Decision Making  Moderate    Rehab Potential  Fair    PT Frequency  2x / week    PT Duration  8 weeks    PT Treatment/Interventions  ADLs/Self Care Home Management;Aquatic Therapy;Biofeedback;Cryotherapy;Electrical Stimulation;Iontophoresis 418mml Dexamethasone;Moist Heat;Traction;Ultrasound;Parrafin;Therapeutic exercise;Therapeutic activities;Functional mobility  training;Stair training;Gait training;DME Instruction;Balance training;Neuromuscular re-education;Patient/family education;Orthotic Fit/Training;Cognitive remediation;Manual techniques;Passive range of motion;Energy conservation;Splinting;Taping;Vestibular;Visual/perceptual remediation/compensation    Consulted and Agree with Plan of Care  Patient;Family member/caregiver    Family Member Consulted  Wife       Patient will benefit from skilled therapeutic intervention in order to improve the following deficits and impairments:  Abnormal gait, Cardiopulmonary status limiting activity, Decreased activity tolerance, Decreased balance, Decreased knowledge of precautions, Decreased endurance, Decreased coordination, Decreased cognition, Decreased knowledge of use of DME, Decreased mobility, Decreased safety awareness, Difficulty walking, Decreased strength, Impaired flexibility, Impaired perceived functional ability, Impaired UE functional use, Impaired tone, Postural dysfunction, Improper body mechanics  Visit Diagnosis: Muscle weakness (generalized)  Difficulty walking     Problem List Patient Active Problem List   Diagnosis Date Noted  . Oropharyngeal dysphagia 06/21/2019  . Moderate protein-calorie malnutrition ([facility]/01/2020  . Balance disorder 06/21/2019  . Left homonymous hemianopsia 06/21/2019  . Pain due to onychomycosis of toenails of both feet 01/03/2019  . Lewy body dementia without behavioral disturbance ([facility]/04/2018  . Elevated PSA 12/09/2016  . Vitamin D deficiency 12/09/2016  . Chronic pain of right knee 11/04/2016  . Hyperlipidemia 11/04/2016  . CKD (chronic kidney disease), stage III 11/04/2016  . Osteoarthritis of multiple joints 11/03/2016  . Chronic right hip pain 11/03/2016  . 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 Hypothyroidism 11/03/2014  . Asthma 11/03/2014  . Osteoporosis 11/03/2014  . Skin lesion of chest wall 11/03/2014  .  Trigger finger of both hands 11/03/2014   1:49 PM, 10/04/19 [place] GrandchildPT, DPT Physical Therapist - [email or web address] 10/04/2019, 1:29 PM  [facility] [address]  [place], [place], [address] Phone: [phone]   Fax:  [phone]  Name: RIK WADEL MRN: [id number] Date of Birth: 01/01/1941

## 2019-10-06 ENCOUNTER — Emergency Department: Payer: Medicare Other

## 2019-10-06 ENCOUNTER — Emergency Department
Admission: EM | Admit: 2019-10-06 | Discharge: 2019-10-06 | Disposition: A | Discharge status: Home / Self Care | Payer: Medicare Other | Attending: Emergency Medicine | Admitting: Emergency Medicine

## 2019-10-06 ENCOUNTER — Ambulatory Visit: Payer: Medicare Other

## 2019-10-06 ENCOUNTER — Other Ambulatory Visit: Payer: Self-pay

## 2019-10-06 DIAGNOSIS — W108XXA Fall (on) (from) other stairs and steps, initial encounter: Secondary | ICD-10-CM | POA: Diagnosis not present

## 2019-10-06 DIAGNOSIS — S61511A Laceration without foreign body of right wrist, initial encounter: Secondary | ICD-10-CM | POA: Diagnosis not present

## 2019-10-06 DIAGNOSIS — Z87891 Personal history of nicotine dependence: Secondary | ICD-10-CM | POA: Insufficient documentation

## 2019-10-06 DIAGNOSIS — S60511A Abrasion of right hand, initial encounter: Secondary | ICD-10-CM | POA: Insufficient documentation

## 2019-10-06 DIAGNOSIS — R262 Difficulty in walking, not elsewhere classified: Secondary | ICD-10-CM

## 2019-10-06 DIAGNOSIS — M6281 Muscle weakness (generalized): Secondary | ICD-10-CM

## 2019-10-06 DIAGNOSIS — Y939 Activity, unspecified: Secondary | ICD-10-CM | POA: Insufficient documentation

## 2019-10-06 DIAGNOSIS — Y999 Unspecified external cause status: Secondary | ICD-10-CM | POA: Diagnosis not present

## 2019-10-06 DIAGNOSIS — M79621 Pain in right upper arm: Secondary | ICD-10-CM | POA: Diagnosis not present

## 2019-10-06 DIAGNOSIS — Z79899 Other long term (current) drug therapy: Secondary | ICD-10-CM | POA: Diagnosis not present

## 2019-10-06 DIAGNOSIS — F0280 Dementia in other diseases classified elsewhere without behavioral disturbance: Secondary | ICD-10-CM | POA: Insufficient documentation

## 2019-10-06 DIAGNOSIS — Z7982 Long term (current) use of aspirin: Secondary | ICD-10-CM | POA: Diagnosis not present

## 2019-10-06 DIAGNOSIS — S41111A Laceration without foreign body of right upper arm, initial encounter: Secondary | ICD-10-CM | POA: Diagnosis not present

## 2019-10-06 DIAGNOSIS — E039 Hypothyroidism, unspecified: Secondary | ICD-10-CM | POA: Insufficient documentation

## 2019-10-06 DIAGNOSIS — S6991XA Unspecified injury of right wrist, hand and finger(s), initial encounter: Secondary | ICD-10-CM | POA: Diagnosis present

## 2019-10-06 DIAGNOSIS — Y929 Unspecified place or not applicable: Secondary | ICD-10-CM | POA: Diagnosis not present

## 2019-10-06 DIAGNOSIS — S0990XA Unspecified injury of head, initial encounter: Secondary | ICD-10-CM | POA: Diagnosis not present

## 2019-10-06 DIAGNOSIS — S4991XA Unspecified injury of right shoulder and upper arm, initial encounter: Secondary | ICD-10-CM | POA: Diagnosis not present

## 2019-10-06 DIAGNOSIS — S61401A Unspecified open wound of right hand, initial encounter: Secondary | ICD-10-CM | POA: Diagnosis not present

## 2019-10-06 DIAGNOSIS — S60221A Contusion of right hand, initial encounter: Secondary | ICD-10-CM | POA: Diagnosis not present

## 2019-10-06 MED ORDER — NEOSPORIN PLUS PAIN RELIEF MS 3.5-10000-10 EX CREA: TOPICAL_CREAM | Freq: Two times a day (BID) | CUTANEOUS | 0 refills | Status: DC

## 2019-10-06 MED ORDER — BACITRACIN-NEOMYCIN-POLYMYXIN 400-5-5000 EX OINT
TOPICAL_OINTMENT | Freq: Once | CUTANEOUS | Status: AC
  Administered 2019-10-06: 1 via TOPICAL
  Filled 2019-10-06: qty 1

## 2019-10-06 NOTE — ED Notes (Signed)
Abrasions to right hand and right upper arm cleansed with saline and dressing applied

## 2019-10-06 NOTE — Discharge Instructions (Addendum)
Follow discharge care instructions and apply antibacterial ointment twice a day.

## 2019-10-06 NOTE — ED Notes (Signed)
See triage note  Presents s/p fall couple of days ago  Family states he fell down 4 steps  Hit his head   No LOC    Family is concerned about his right hand    Hand is red  With some skin tears

## 2019-10-06 NOTE — ED Provider Notes (Signed)
[facility] Emergency Department Provider Note   ____________________________________________   First MD Initiated Contact with Patient 10/06/19 1708     (approximate)  I have reviewed the triage vital signs and the nursing notes.   HISTORY Via wife secondary to patient dementia. Chief Complaint Fall and Hand Pain    HPI Blake Bates is a 79 y.o. male patient sent from urgent care clinic secondary to a fall which occurred 2 days ago.  Patient hit the right side of his head but denies LOC.  Patient also had bruising with multiple abrasions and skin tears to the right hand and posterior right humerus.  Unable to get a pain score.  No palliative measures prior to arrival.         Past Medical History:  Diagnosis Date  . Asthma   . Diverticulosis   . Full dentures   . Hyperlipidemia   . Hypothyroidism    s/p subtotal thyroidectomy  . Muscle spasms of lower extremity    Right upper leg  . Osteoporosis   . Rhinitis, allergic    pollens, mold, animal dander    Patient Active Problem List   Diagnosis Date Noted  . Oropharyngeal dysphagia 06/21/2019  . Moderate protein-calorie malnutrition ([place]) 06/21/2019  . Balance disorder 06/21/2019  . Left homonymous hemianopsia 06/21/2019  . Pain due to onychomycosis of toenails of both feet 01/03/2019  . Lewy body dementia without behavioral disturbance ([place]) 07/21/2017  . Elevated PSA 12/09/2016  . Vitamin D deficiency 12/09/2016  . Chronic pain of right knee 11/04/2016  . Hyperlipidemia 11/04/2016  . CKD (chronic kidney disease), stage III 11/04/2016  . Osteoarthritis of multiple joints 11/03/2016  . Chronic right hip pain 11/03/2016  . 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 Hypothyroidism 11/03/2014  . Asthma 11/03/2014  . Osteoporosis 11/03/2014  . Skin lesion of chest wall 11/03/2014  . Trigger finger of both hands 11/03/2014    Past Surgical History:    Procedure Laterality Date  . CATARACT EXTRACTION W/PHACO Left 09/20/2014   Procedure: CATARACT EXTRACTION PHACO AND INTRAOCULAR LENS PLACEMENT (IOC);  Surgeon: Leandrew Koyanagi, MD;  Location: [place];  Service: Ophthalmology;  Laterality: Left;  . COLONOSCOPY    . PARATHYROIDECTOMY    . THYROID SURGERY     subtotal    Prior to Admission medications   Medication Sig Start Date End Date Taking? Authorizing Provider  Acetaminophen (TYLENOL ARTHRITIS PAIN PO) Take by mouth. Once a day    [provider]  albuterol (PROVENTIL HFA;VENTOLIN HFA) 108 (90 BASE) MCG/ACT inhaler Inhale into the lungs every 6 (six) hours as needed for wheezing or shortness of breath.    [provider]  aspirin EC 81 MG tablet Take 1 tablet (81 mg total) by mouth daily. 12/09/16   Karamalegos, Devonne Doughty, DO  Cholecalciferol (VITAMIN D-3 PO) Take 5,000 Units/oz/day by mouth daily. PM     [provider]  Cyanocobalamin (VITAMIN B-12) 5000 MCG SUBL Place under the tongue daily.    [provider]  donepezil (ARICEPT) 10 MG tablet Take 1 tablet daily 09/07/19   Cameron Sprang, MD  levothyroxine (SYNTHROID) 50 MCG tablet TAKE 1 TABLET(50 MCG) BY MOUTH DAILY BEFORE BREAKFAST 04/11/19   Karamalegos, Devonne Doughty, DO  loratadine (CLARITIN) 10 MG tablet Take 10 mg by mouth daily as needed for allergies. PM    [provider]  meloxicam (MOBIC) 15 MG tablet TAKE 1 TABLET BY MOUTH DAILY  AS NEEDED FOR PAIN. FOR ARTHRITIS FLARE, TAKE UP TO 1-2 WEEKS AT A TIME. 07/04/19   [name], DO  mirtazapine (REMERON) 15 MG tablet Take 1 tablet (15 mg total) by mouth at bedtime. 09/21/19   Cameron Sprang, MD  neomycin-polymyxin-pramoxine (NEOSPORIN PLUS) 1 % cream Apply topically 2 (two) times daily. 10/06/19   Sable Feil, PA-C    Allergies Mold extract  [trichophyton] and Morphine  Family History  Problem Relation Age of Onset  . Stroke Mother 69  . Lung  cancer Father 70  . Prostate cancer Paternal Grandfather 1  . Stroke Paternal Aunt     Social History Social History   Tobacco Use  . Smoking status: Former Smoker    Packs/day: 0.00    Years: 60.00    Pack years: 0.00    Types: Pipe    Quit date: 05/12/2016    Years since quitting: 3.4  . Smokeless tobacco: Former Systems developer  . Tobacco comment: Self taper down from full pipe daily down to half to quarter pipe  Substance Use Topics  . Alcohol use: Not Currently    Comment: occasionally beer or wine   . Drug use: No    Review of Systems Constitutional: No fever/chills Eyes: No visual changes. ENT: No sore throat. Cardiovascular: Denies chest pain. Respiratory: Denies shortness of breath. Gastrointestinal: No abdominal pain.  No nausea, no vomiting.  No diarrhea.  No constipation. Genitourinary: Negative for dysuria. Musculoskeletal: Negative for back pain. Skin: Negative for rash.  Skin tears and multiple abrasions to the right upper extremity. Neurological: Negative for headaches, focal weakness or numbness. Allergic/Immunilogical: Mold extract and morphine. ____________________________________________   PHYSICAL EXAM:  VITAL SIGNS: ED Triage Vitals [10/06/19 1629]  Enc Vitals Group     BP (!) 105/59     Pulse Rate 73     Resp      Temp 98.1 F (36.7 C)     Temp Source Oral     SpO2 100 %     Weight 135 lb (61.2 kg)     Height 5\' 11"  (1.803 m)     Head Circumference      Peak Flow      Pain Score 0     Pain Loc      Pain Edu?      Excl. in [place]?     Constitutional: Alert . Well appearing and in no acute distress. Neck: No stridor.  No cervical spine tenderness to palpation. Hematological/Lymphatic/Immunilogical: No cervical lymphadenopathy. Cardiovascular: Normal rate, regular rhythm. Grossly normal heart sounds.  Good peripheral circulation. Respiratory: Normal respiratory effort.  No retractions. Lungs CTAB. Gastrointestinal: Soft and nontender. No  distention. No abdominal bruits. No CVA tenderness. Musculoskeletal: No lower extremity tenderness nor edema.  No joint effusions. Neurologic:  Normal speech and language. No gross focal neurologic deficits are appreciated. No gait instability. Skin:  Skin is warm, dry and intact. No rash noted. Psychiatric: Mood and affect are normal. Speech and behavior are normal.  ____________________________________________   LABS (all labs ordered are listed, but only abnormal results are displayed)  Labs Reviewed - No data to display ____________________________________________  EKG   ____________________________________________  RADIOLOGY  ED MD interpretation:    Official radiology report(s): CT Head Wo Contrast  Result Date: 10/06/2019 CLINICAL DATA:  [place] down 4 stairs 10/04/2019, hit right-side of head EXAM: CT HEAD WITHOUT CONTRAST TECHNIQUE: Contiguous axial images were obtained from the base of the skull through the vertex without  intravenous contrast. COMPARISON:  05/12/2019 FINDINGS: Brain: Stable diffuse cortical atrophy. No signs of acute infarct or hemorrhage. Chronic ex vacuo dilatation of the lateral ventricles. Midline structures otherwise unremarkable. There are no acute extra-axial fluid collections. Stable arachnoid cysts at the bilateral parietal convexities. No mass effect. Vascular: No hyperdense vessel or unexpected calcification. Skull: Normal. Negative for fracture or focal lesion. Sinuses/Orbits: Chronic opacification of the right maxillary sinus. Remaining sinuses are clear. Other: None. IMPRESSION: 1. No acute intracranial process. 2. Chronic diffuse cortical atrophy. 3. Stable bilateral parietal arachnoid cysts. 4. Chronic right maxillary sinus disease, stable. Electronically Signed   By: Randa Ngo M.D.   On: 10/06/2019 17:59   DG Humerus Right  Result Date: 10/06/2019 CLINICAL DATA:  [place] down 4 stairs, pain at EXAM: RIGHT HUMERUS - 2+ VIEW COMPARISON:  None.  FINDINGS: Frontal and lateral views of the right humerus demonstrate no fractures. Alignment of the right shoulder and elbow is anatomic. Soft tissues are normal. IMPRESSION: 1. Unremarkable right humerus. Electronically Signed   By: Randa Ngo M.D.   On: 10/06/2019 17:55   DG Hand Complete Right  Result Date: 10/06/2019 CLINICAL DATA:  [place], right hand pain, erythema, bruising, abrasions EXAM: RIGHT HAND - COMPLETE 3+ VIEW COMPARISON:  None. FINDINGS: Frontal, oblique, and lateral views of the right hand are obtained. There are no acute displaced fractures. Joint spaces are relatively well preserved. Alignment is anatomic. Soft tissues are normal. IMPRESSION: 1. Unremarkable right hand. Electronically Signed   By: Randa Ngo M.D.   On: 10/06/2019 17:56    ____________________________________________   PROCEDURES  Procedure(s) performed (including Critical Care):  Procedures   ____________________________________________   INITIAL IMPRESSION / ASSESSMENT AND PLAN / ED COURSE  As part of my medical decision making, I reviewed the following data within the [place]     Patient presents right humerus/ hand pain and abrasions secondary to fall 2 days ago.  Patient was sent by urgent care due to history of hit his head when he fell.  No LOC.  Discussed negative CT findings with patient.  Discussed negative x-rays of the humerus and hand.  Patient given discharge care instructions.  Patient's wounds were cleaned and bandaged.  Advised to follow-up PCP.    Blake Bates was evaluated in Emergency Department on 10/06/2019 for the symptoms described in the history of present illness. He was evaluated in the context of the global COVID-19 pandemic, which necessitated consideration that the patient might be at risk for infection with the SARS-CoV-2 virus that causes COVID-19. Institutional protocols and algorithms that pertain to the evaluation of patients at risk for  COVID-19 are in a state of rapid change based on information released by regulatory bodies including the CDC and federal and state organizations. These policies and algorithms were followed during the patient's care in the ED.       ____________________________________________   FINAL CLINICAL IMPRESSION(S) / ED DIAGNOSES  Final diagnoses:  Contusion of right hand, initial encounter  Abrasion of right hand, initial encounter     ED Discharge Orders         Ordered    neomycin-polymyxin-pramoxine (NEOSPORIN PLUS) 1 % cream  2 times daily     10/06/19 1816           Note:  This document was prepared using Dragon voice recognition software and may include unintentional dictation errors.    Sable Feil, PA-C 10/06/19 1821    Duffy Bruce, MD  10/06/19 2248  

## 2019-10-06 NOTE — ED Triage Notes (Signed)
Pt arrived by POV from home - Pt had a fall 5/25 and fell down 4 stairs injuring right hand and hitting right side of head - Pt right hand into wrist is erythematous and bruised with multiple abrasions - Pt c/o pain in right hand - Pt spouse reports that they took pt to the Urgent Care and they referred him to the ED

## 2019-10-06 NOTE — Therapy (Signed)
[place] MAIN [facility] SERVICES [address] [place], [place], [address] Phone: [phone]   Fax:  [phone]  Physical Therapy Treatment  Patient Details  Name: Blake Bates MRN: [id number] Date of Birth: 1940-05-15 No data recorded  Encounter Date: 10/06/2019  PT End of Session - 10/06/19 1401    Visit Number  23    Number of Visits  32    Date for PT Re-Evaluation  10/27/19    Authorization Type  3/10 PN 09/22/19    PT Start Time  1301    PT Stop Time  1346    PT Time Calculation (min)  45 min    Equipment Utilized During Treatment  Gait belt;Other (comment)   FWW   Behavior During Therapy  WFL for tasks assessed/performed       Past Medical History:  Diagnosis Date  . Asthma   . Diverticulosis   . Full dentures   . Hyperlipidemia   . Hypothyroidism    s/p subtotal thyroidectomy  . Muscle spasms of lower extremity    Right upper leg  . Osteoporosis   . Rhinitis, allergic    pollens, mold, animal dander    Past Surgical History:  Procedure Laterality Date  . CATARACT EXTRACTION W/PHACO Left 09/20/2014   Procedure: CATARACT EXTRACTION PHACO AND INTRAOCULAR LENS PLACEMENT (IOC);  Surgeon: Leandrew Koyanagi, MD;  Location: [place];  Service: Ophthalmology;  Laterality: Left;  . COLONOSCOPY    . PARATHYROIDECTOMY    . THYROID SURGERY     subtotal    There were no vitals filed for this visit.  Subjective Assessment - 10/06/19 1400    Subjective  Patient arrived with multiple abrasians on hands and R elbow from falling backwards on steps on tuesday evening. Wife reports no head injury/bleeding of head or c/o of dizziness. Patient reports no pain despite open wounds of hands and discoloration.    Patient is accompained by:  Family member    Pertinent History  Pt has Lewy Body dementia and Parkinson's disease, and has difficulty walking due to weakness and balance deficits, as well as difficulty with motor  planning and decision making.  Pt is a high fall risk and has fallen 5x in the past 2 weeks.    Limitations  Standing;Walking;House hold activities    Currently in Pain?  No/denies              Patient arrived with multiple abrasions on hands and R elbow. Wounds are open, a few bleeding, redness and warmth around surrounding regions. Hands bandaged for protective coverage due to bleeding   In // bars: Large step and stomp over theraband to promote larger step length and foot clearance, BUE support 10x each LE, counting helps with patient task performance Lateral stepping 2x length of // bars, challenge with foot clearance 3lb ankle weight: -large step over and back half foam roller 10x each LE,   Ambulate with RW 96 ft with cues for task orientation, sequencing, bigger step length, upright posture, improved ability to turn however still challenged by obstacle negotiation. x2 trials  Seated on plinth table: Balloon taps reaching inside/outside BOS ; cues for L hand involvement, improved coordination and power behind passes x 3 minutes Up tall, reach out, reach down x 5 for improved posture   Patient requires frequent task orientation and sequencing cues for memory. Use of visual demonstration in combination to verbal cues results in best response. Frequent cues are required  for L visual field due to L hemi-neglect.  Pt educated throughout session about proper posture and technique with exercises. Improved exercise technique, movement at target joints, use of target muscles after min to mod verbal, visual, tactile cues.  Patient arrived with multiple abrasions on hands and R elbow. Wounds are open, a few bleeding, redness and warmth around surrounding regions. Hands bandaged for protective coverage due to bleeding and wife advised to take patient to urgent care after session. Wife made appointment at urgent care and headed there at end of session with patient due to worry of infection.                       PT Education - 10/06/19 1401    Education Details  need to go to urgent care    Person(s) Educated  Patient;Spouse    Methods  Explanation    Comprehension  Verbalized understanding       PT Short Term Goals - 09/22/19 1359      PT SHORT TERM GOAL #1   Title  Pt and caregiver will be educated in a new HEP for overall LE and UE strengthening, and kitchen sink balance exercises to decrease fall risk.    Baseline  No current exercises per caregiver 3/29: occasionally compliant with HEP 4/23: caregiver challenged to help due to patient cognition 5/13: improving with compliance    Time  2    Period  Weeks    Status  Partially Met    Target Date  10/06/19      PT SHORT TERM GOAL #2   Title  Pt and wife will be educated in proper use of assistive devices and safety techniques at home (increased lighting, removal of area rugs etc) to decrease fall risk    Baseline  Currently no education on home equipment and not using assistive devices. 3/29: challenged with compliance with use of AD 4/23: was utilizing rollator prior to sudden cognitive decline 5/13: utilizing RW occasionally    Time  2    Period  Weeks    Status  Partially Met    Target Date  10/06/19        PT Long Term Goals - 09/22/19 1401      PT LONG TERM GOAL #1   Title  Pt will have increased overall LE gross strength by at least 1/2-1 muscle grade throughout to decrease his fall risk and improve his ability to ambulate further distances safely.    Baseline  Pt currently at 2+ to 3+/5 grossly throughout UE's, and 3 to 4/5 grossly throughout LE's (see objective notes) 3/29: 4-/5 LE strength 4/23: grossly 3+/5; 5/13: grossly 4-/5    Time  8    Period  Weeks    Status  Partially Met    Target Date  10/27/19      PT LONG TERM GOAL #2   Title  Pt's caregiver will report no greater than 0-1 falls over 1 week period due to improved balance and strength, and improved saftey techniques  including assistive device.    Baseline  Pt currently falling about 5 times over 2-week period 3/29: 1 fall since starting therapy (happened in past week) 4/23: no falls in 3 weeks    Time  8    Period  Weeks    Status  Achieved      PT LONG TERM GOAL #3   Title  Pt will have improved Timed Up and Go score  to at least 1 minute or better, indicating decreased fall risk since initial PT evaluation.    Baseline  Pt performed TUG in 1 minute and 30 seconds at initial evaluation. 3/29: 29.7 seconds 4/23: 45 seconds    Time  8    Period  Weeks    Status  Achieved      PT LONG TERM GOAL #4   Title  Pt will have improved 5 Times Sit to Stand score to at least 20 seconds or better, indicating decreased fall risk since time of initial evaluation.    Baseline  Pt performed 5 Times Sit to Stand in 37 seconds at initial evaluation. 3/29; 26.07 seconds w/o UE support (holding onto rainbow ball) 4/23: 20 seconds with hands on knees, unable to perform without UE support. 5/13: 16 seconds with hands on knees, not able to fully stand final stand    Time  8    Period  Weeks    Status  Partially Met    Target Date  10/27/19      PT LONG TERM GOAL #5   Title  Pt will ascend/ descend 4 stairs safely with use of B handrails with SB/CGA to decrease fall risk.    Baseline  Pt currently trips of the 4 steps he uses to enter/exit his home per wife's report. 3/29: one step at a time with lateral steppage descending 4/23: deferred due to cognition/confusion    Time  8    Period  Weeks    Status  Partially Met    Target Date  10/27/19      PT LONG TERM GOAL #6   Title  Pt will have improved Timed Up and Go score to at least 20 seconds or better, indicating decreased fall risk since initial PT evaluation.    Baseline  4/23: 45 seconds without AD. 5/13; 49 seconds with RW    Time  8    Period  Weeks    Status  On-going    Target Date  10/27/19            Plan - 10/06/19 1402    Clinical Impression  Statement  Patient arrived with multiple abrasions on hands and R elbow. Wounds are open, a few bleeding, redness and warmth around surrounding regions. Hands bandaged for protective coverage due to bleeding and wife advised to take patient to urgent care after session. Wife made appointment at urgent care and headed there at end of session with patient due to worry of infection.    Personal Factors and Comorbidities  Age;Comorbidity 3+;Fitness;Past/Current Experience;Time since onset of injury/illness/exacerbation    Comorbidities  balance, left homonymous hemianopsia, CKD, HLD, Hypothyroidism, osteoprosis,    Examination-Activity Limitations  Bathing;Caring for Others;Carry;Dressing;Locomotion Level;Lift;Reach Overhead;Squat;Stand;Transfers;Stairs    Examination-Participation Restrictions  Church;Cleaning;Community Activity;Driving;Interpersonal Relationship;Laundry;Volunteer;Shop;School;Meal Prep;Yard Work    [employer] officer  Evolving/Moderate complexity    Rehab Potential  Fair    PT Frequency  2x / week    PT Duration  8 weeks    PT Treatment/Interventions  ADLs/Self Care Home Management;Aquatic Therapy;Biofeedback;Cryotherapy;Electrical Stimulation;Iontophoresis 23m/ml Dexamethasone;Moist Heat;Traction;Ultrasound;Parrafin;Therapeutic exercise;Therapeutic activities;Functional mobility training;Stair training;Gait training;DME Instruction;Balance training;Neuromuscular re-education;Patient/family education;Orthotic Fit/Training;Cognitive remediation;Manual techniques;Passive range of motion;Energy conservation;Splinting;Taping;Vestibular;Visual/perceptual remediation/compensation    Consulted and Agree with Plan of Care  Patient;Family member/caregiver    Family Member Consulted  Wife       Patient will benefit from skilled therapeutic intervention in order to improve the following deficits and impairments:  Abnormal gait, Cardiopulmonary status limiting activity, Decreased  activity tolerance, Decreased balance, Decreased knowledge of precautions, Decreased endurance, Decreased coordination, Decreased cognition, Decreased knowledge of use of DME, Decreased mobility, Decreased safety awareness, Difficulty walking, Decreased strength, Impaired flexibility, Impaired perceived functional ability, Impaired UE functional use, Impaired tone, Postural dysfunction, Improper body mechanics  Visit Diagnosis: Muscle weakness (generalized)  Difficulty walking     Problem List Patient Active Problem List   Diagnosis Date Noted  . Oropharyngeal dysphagia 06/21/2019  . Moderate protein-calorie malnutrition ([place]) 06/21/2019  . Balance disorder 06/21/2019  . Left homonymous hemianopsia 06/21/2019  . Pain due to onychomycosis of toenails of both feet 01/03/2019  . Lewy body dementia without behavioral disturbance ([place]) 07/21/2017  . Elevated PSA 12/09/2016  . Vitamin D deficiency 12/09/2016  . Chronic pain of right knee 11/04/2016  . Hyperlipidemia 11/04/2016  . CKD (chronic kidney disease), stage III 11/04/2016  . Osteoarthritis of multiple joints 11/03/2016  . Chronic right hip pain 11/03/2016  . 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 Hypothyroidism 11/03/2014  . Asthma 11/03/2014  . Osteoporosis 11/03/2014  . Skin lesion of chest wall 11/03/2014  . Trigger finger of both hands 11/03/2014   Janna Arch, PT, DPT   10/06/2019, 2:04 PM  [place] MAIN [facility] SERVICES [address] [place], [place], [address] Phone: [phone]   Fax:  [phone]  Name: TULIO FACUNDO MRN: [id number] Date of Birth: Jul 09, 1940

## 2019-10-11 ENCOUNTER — Ambulatory Visit: Payer: Medicare Other | Attending: Neurology | Admitting: Physical Therapy

## 2019-10-11 ENCOUNTER — Encounter: Payer: Self-pay | Admitting: Physical Therapy

## 2019-10-11 ENCOUNTER — Other Ambulatory Visit: Payer: Self-pay

## 2019-10-11 DIAGNOSIS — M6281 Muscle weakness (generalized): Secondary | ICD-10-CM | POA: Diagnosis not present

## 2019-10-11 DIAGNOSIS — R262 Difficulty in walking, not elsewhere classified: Secondary | ICD-10-CM | POA: Insufficient documentation

## 2019-10-11 NOTE — Therapy (Signed)
[place] [facility] [address] [place], [place], [address] Phone: [phone]   Fax:  [phone]  Physical Therapy Treatment  Patient Details  Name: Blake Bates MRN: [id number] Date of Birth: 1940/10/12 No data recorded  Encounter Date: 10/11/2019  PT End of Session - 10/11/19 1306    Visit Number  24    Number of Visits  32    Date for PT Re-Evaluation  10/27/19    Authorization Type  3/10 PN 09/22/19    PT Start Time  1300    PT Stop Time  1342    PT Time Calculation (min)  42 min    Equipment Utilized During Treatment  Gait belt;Other (comment)   FWW   Activity Tolerance  Patient tolerated treatment well    Behavior During Therapy  WFL for tasks assessed/performed       Past Medical History:  Diagnosis Date  . Asthma   . Diverticulosis   . Full dentures   . Hyperlipidemia   . Hypothyroidism    s/p subtotal thyroidectomy  . Muscle spasms of lower extremity    Right upper leg  . Osteoporosis   . Rhinitis, allergic    pollens, mold, animal dander    Past Surgical History:  Procedure Laterality Date  . CATARACT EXTRACTION W/PHACO Left 09/20/2014   Procedure: CATARACT EXTRACTION PHACO AND INTRAOCULAR LENS PLACEMENT (IOC);  Surgeon: Leandrew Koyanagi, MD;  Location: [facility];  Service: Ophthalmology;  Laterality: Left;  . COLONOSCOPY    . PARATHYROIDECTOMY    . THYROID SURGERY     subtotal    There were no vitals filed for this visit.  Subjective Assessment - 10/11/19 1305    Subjective  Patient is doing better today, no pain. No new concerns    Patient is accompained by:  Family member    Pertinent History  Pt has Lewy Body dementia and Parkinson's disease, and has difficulty walking due to weakness and balance deficits, as well as difficulty with motor planning and decision making.  Pt is a high fall risk and has fallen 5x in the past 2 weeks.    Limitations  Standing;Walking;House hold  activities    How long can you sit comfortably?  unlimited    How long can you stand comfortably?  5 minutes    How long can you walk comfortably?  2-3 minutes, must hold onto things due to weakness and imbalance    Patient Stated Goals  "to walk better, and feel stronger"    Currently in Pain?  No/denies    Pain Score  0-No pain       Treatment" Nu-step x 5 mins  Sit to stand from chair with arms to stand with Rw  X 5 reps Gait training with RW x 100 feet x 3  Patient performed with instruction, verbal cues, tactile cues of therapist: goal: increase tissue extensibility, promote proper posture, improve mobility                          PT Education - 10/11/19 1305    Education Details  safety with mobility    Person(s) Educated  Patient    Methods  Explanation    Comprehension  Need further instruction       PT Short Term Goals - 09/22/19 1359      PT SHORT TERM GOAL #1   Title  Pt and caregiver will be  educated in a new HEP for overall LE and UE strengthening, and kitchen sink balance exercises to decrease fall risk.    Baseline  No current exercises per caregiver 3/29: occasionally compliant with HEP 4/23: caregiver challenged to help due to patient cognition 5/13: improving with compliance    Time  2    Period  Weeks    Status  Partially Met    Target Date  10/06/19      PT SHORT TERM GOAL #2   Title  Pt and wife will be educated in proper use of assistive devices and safety techniques at home (increased lighting, removal of area rugs etc) to decrease fall risk    Baseline  Currently no education on home equipment and not using assistive devices. 3/29: challenged with compliance with use of AD 4/23: was utilizing rollator prior to sudden cognitive decline 5/13: utilizing RW occasionally    Time  2    Period  Weeks    Status  Partially Met    Target Date  10/06/19        PT Long Term Goals - 09/22/19 1401      PT LONG TERM GOAL #1   Title  Pt  will have increased overall LE gross strength by at least 1/2-1 muscle grade throughout to decrease his fall risk and improve his ability to ambulate further distances safely.    Baseline  Pt currently at 2+ to 3+/5 grossly throughout UE's, and 3 to 4/5 grossly throughout LE's (see objective notes) 3/29: 4-/5 LE strength 4/23: grossly 3+/5; 5/13: grossly 4-/5    Time  8    Period  Weeks    Status  Partially Met    Target Date  10/27/19      PT LONG TERM GOAL #2   Title  Pt's caregiver will report no greater than 0-1 falls over 1 week period due to improved balance and strength, and improved saftey techniques including assistive device.    Baseline  Pt currently falling about 5 times over 2-week period 3/29: 1 fall since starting therapy (happened in past week) 4/23: no falls in 3 weeks    Time  8    Period  Weeks    Status  Achieved      PT LONG TERM GOAL #3   Title  Pt will have improved Timed Up and Go score to at least 1 minute or better, indicating decreased fall risk since initial PT evaluation.    Baseline  Pt performed TUG in 1 minute and 30 seconds at initial evaluation. 3/29: 29.7 seconds 4/23: 45 seconds    Time  8    Period  Weeks    Status  Achieved      PT LONG TERM GOAL #4   Title  Pt will have improved 5 Times Sit to Stand score to at least 20 seconds or better, indicating decreased fall risk since time of initial evaluation.    Baseline  Pt performed 5 Times Sit to Stand in 37 seconds at initial evaluation. 3/29; 26.07 seconds w/o UE support (holding onto rainbow ball) 4/23: 20 seconds with hands on knees, unable to perform without UE support. 5/13: 16 seconds with hands on knees, not able to fully stand final stand    Time  8    Period  Weeks    Status  Partially Met    Target Date  10/27/19      PT LONG TERM GOAL #5   Title  Pt  will ascend/ descend 4 stairs safely with use of B handrails with SB/CGA to decrease fall risk.    Baseline  Pt currently trips of the 4 steps  he uses to enter/exit his home per wife's report. 3/29: one step at a time with lateral steppage descending 4/23: deferred due to cognition/confusion    Time  8    Period  Weeks    Status  Partially Met    Target Date  10/27/19      PT LONG TERM GOAL #6   Title  Pt will have improved Timed Up and Go score to at least 20 seconds or better, indicating decreased fall risk since initial PT evaluation.    Baseline  4/23: 45 seconds without AD. 5/13; 49 seconds with RW    Time  8    Period  Weeks    Status  On-going    Target Date  10/27/19            Plan - 10/11/19 1306    Clinical Impression Statement   Pt was able to perform all exercises today with CGA and max cuing.. Pt was able to perform all gait and functional mobility activities demonstrating need for cues and need for safety with placement of B hands. .  Pt requires verbal, visual and tactile cues during exercise in order to complete tasks with proper form and technique, as well as to stay on task.  Pt would continue to benefit from skilled PT services in order to further strengthen LE's, improve static and dynamic balance, and improve coordination in order to increase functional mobility and decrease risk of falls   Personal Factors and Comorbidities  Age;Comorbidity 3+;Fitness;Past/Current Experience;Time since onset of injury/illness/exacerbation    Comorbidities  balance, left homonymous hemianopsia, CKD, HLD, Hypothyroidism, osteoprosis,    Examination-Activity Limitations  Bathing;Caring for Others;Carry;Dressing;Locomotion Level;Lift;Reach Overhead;Squat;Stand;Transfers;Stairs    Examination-Participation Restrictions  Church;Cleaning;Community Activity;Driving;Interpersonal Relationship;Laundry;Volunteer;Shop;School;Meal Prep;Yard Work    [employer] officer  Evolving/Moderate complexity    Rehab Potential  Fair    PT Frequency  2x / week    PT Duration  8 weeks    PT Treatment/Interventions  ADLs/Self Care  Home Management;Aquatic Therapy;Biofeedback;Cryotherapy;Electrical Stimulation;Iontophoresis 30m/ml Dexamethasone;Moist Heat;Traction;Ultrasound;Parrafin;Therapeutic exercise;Therapeutic activities;Functional mobility training;Stair training;Gait training;DME Instruction;Balance training;Neuromuscular re-education;Patient/family education;Orthotic Fit/Training;Cognitive remediation;Manual techniques;Passive range of motion;Energy conservation;Splinting;Taping;Vestibular;Visual/perceptual remediation/compensation    Consulted and Agree with Plan of Care  Patient;Family member/caregiver    Family Member Consulted  Wife       Patient will benefit from skilled therapeutic intervention in order to improve the following deficits and impairments:  Abnormal gait, Cardiopulmonary status limiting activity, Decreased activity tolerance, Decreased balance, Decreased knowledge of precautions, Decreased endurance, Decreased coordination, Decreased cognition, Decreased knowledge of use of DME, Decreased mobility, Decreased safety awareness, Difficulty walking, Decreased strength, Impaired flexibility, Impaired perceived functional ability, Impaired UE functional use, Impaired tone, Postural dysfunction, Improper body mechanics  Visit Diagnosis: Muscle weakness (generalized)  Difficulty walking     Problem List Patient Active Problem List   Diagnosis Date Noted  . Oropharyngeal dysphagia 06/21/2019  . Moderate protein-calorie malnutrition ([facility] 06/21/2019  . Balance disorder 06/21/2019  . Left homonymous hemianopsia 06/21/2019  . Pain due to onychomycosis of toenails of both feet 01/03/2019  . Lewy body dementia without behavioral disturbance ([facility] 07/21/2017  . Elevated PSA 12/09/2016  . Vitamin D deficiency 12/09/2016  . Chronic pain of right knee 11/04/2016  . Hyperlipidemia 11/04/2016  . CKD (chronic kidney disease), stage III 11/04/2016  . Osteoarthritis of multiple  joints 11/03/2016  . Chronic  right hip pain 11/03/2016  . 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 Hypothyroidism 11/03/2014  . Asthma 11/03/2014  . Osteoporosis 11/03/2014  . Skin lesion of chest wall 11/03/2014  . Trigger finger of both hands 11/03/2014    Alanson Puls, [place] DPT 10/11/2019, 1:15 PM  [place] [facility] [address] [place], [place], [address] Phone: [phone]   Fax:  [phone]  Name: Blake Bates MRN: [id number] Date of Birth: 02/18/1941

## 2019-10-13 ENCOUNTER — Ambulatory Visit: Payer: Medicare Other

## 2019-10-17 ENCOUNTER — Other Ambulatory Visit: Payer: Self-pay

## 2019-10-17 ENCOUNTER — Encounter: Payer: Self-pay | Admitting: Physical Therapy

## 2019-10-17 ENCOUNTER — Ambulatory Visit: Payer: Medicare Other | Admitting: Physical Therapy

## 2019-10-17 DIAGNOSIS — R262 Difficulty in walking, not elsewhere classified: Secondary | ICD-10-CM

## 2019-10-17 DIAGNOSIS — M6281 Muscle weakness (generalized): Secondary | ICD-10-CM

## 2019-10-17 NOTE — Therapy (Signed)
[place] [facility]IN [facility] SERVICES [address] [place], [place], [address] Phone: [phone]   Fax:  [phone]  Physical Therapy Treatment  Patient Details  Name: Blake Bates MRN: [id number] Date of Birth: 1940-08-02 No data recorded  Encounter Date: 10/17/2019  PT End of Session - 10/17/19 1435    Visit Number  25    Number of Visits  32    Date for PT Re-Evaluation  10/27/19    Authorization Type  3/10 PN 09/22/19    PT Start Time  1430    PT Stop Time  1510    PT Time Calculation (min)  40 min    Equipment Utilized During Treatment  Gait belt;Other (comment)   FWW   Activity Tolerance  Patient tolerated treatment well    Behavior During Therapy  WFL for tasks assessed/performed       Past Medical History:  Diagnosis Date   Asthma    Diverticulosis    Full dentures    Hyperlipidemia    Hypothyroidism    s/p subtotal thyroidectomy   Muscle spasms of lower extremity    Right upper leg   Osteoporosis    Rhinitis, allergic    pollens, mold, animal dander    Past Surgical History:  Procedure Laterality Date   CATARACT EXTRACTION W/PHACO Left 09/20/2014   Procedure: CATARACT EXTRACTION PHACO AND INTRAOCULAR LENS PLACEMENT ([place]);  Surgeon: Leandrew Koyanagi, MD;  Location: [facility];  Service: Ophthalmology;  Laterality: Left;   COLONOSCOPY     PARATHYROIDECTOMY     THYROID SURGERY     subtotal    There were no vitals filed for this visit.  Subjective Assessment - 10/17/19 1434    Subjective  Patient is doing better today, no pain. No new concerns    Patient is accompained by:  Family member    Pertinent History  Pt has Lewy Body dementia and Parkinson's disease, and has difficulty walking due to weakness and balance deficits, as well as difficulty with motor planning and decision making.  Pt is a high fall risk and has fallen 5x in the past 2 weeks.    Limitations  Standing;Walking;House hold  activities    How long can you sit comfortably?  unlimited    How long can you stand comfortably?  5 minutes    How long can you walk comfortably?  2-3 minutes, must hold onto things due to weakness and imbalance    Patient Stated Goals  "to walk better, and feel stronger"    Currently in Pain?  No/denies    Pain Score  0-No pain         Treatment Nu-step x 5 mins  Sit to stand from chair with arms to stand with Rw  X 5 reps Gait training with RW x 100 feet x 3  TM walking . 4 miles / hour x 5 mins  Standing marching x 20 BLE x 2 sets Standing hip extension x 20 BLE x 2 sets Standing hip abd x 20 BLE x 2 sets  Patient performed with instruction, verbal cues, tactile cues of therapist: goal:increase tissue extensibility, promote proper posture, improve mobility                        PT Education - 10/17/19 1435    Education Details  saftey with transfers and gait    Person(s) Educated  Patient    Methods  Explanation  Comprehension  Verbal cues required;Need further instruction       PT Short Term Goals - 09/22/19 1359      PT SHORT TERM GOAL #1   Title  Pt and caregiver will be educated in a new HEP for overall LE and UE strengthening, and kitchen sink balance exercises to decrease fall risk.    Baseline  No current exercises per caregiver 3/29: occasionally compliant with HEP 4/23: caregiver challenged to help due to patient cognition 5/13: improving with compliance    Time  2    Period  Weeks    Status  Partially Met    Target Date  10/06/19      PT SHORT TERM GOAL #2   Title  Pt and wife will be educated in proper use of assistive devices and safety techniques at home (increased lighting, removal of area rugs etc) to decrease fall risk    Baseline  Currently no education on home equipment and not using assistive devices. 3/29: challenged with compliance with use of AD 4/23: was utilizing rollator prior to sudden cognitive decline 5/13: utilizing  RW occasionally    Time  2    Period  Weeks    Status  Partially Met    Target Date  10/06/19        PT Long Term Goals - 09/22/19 1401      PT LONG TERM GOAL #1   Title  Pt will have increased overall LE gross strength by at least 1/2-1 muscle grade throughout to decrease his fall risk and improve his ability to ambulate further distances safely.    Baseline  Pt currently at 2+ to 3+/5 grossly throughout UE's, and 3 to 4/5 grossly throughout LE's (see objective notes) 3/29: 4-/5 LE strength 4/23: grossly 3+/5; 5/13: grossly 4-/5    Time  8    Period  Weeks    Status  Partially Met    Target Date  10/27/19      PT LONG TERM GOAL #2   Title  Pt's caregiver will report no greater than 0-1 falls over 1 week period due to improved balance and strength, and improved saftey techniques including assistive device.    Baseline  Pt currently falling about 5 times over 2-week period 3/29: 1 fall since starting therapy (happened in past week) 4/23: no falls in 3 weeks    Time  8    Period  Weeks    Status  Achieved      PT LONG TERM GOAL #3   Title  Pt will have improved Timed Up and Go score to at least 1 minute or better, indicating decreased fall risk since initial PT evaluation.    Baseline  Pt performed TUG in 1 minute and 30 seconds at initial evaluation. 3/29: 29.7 seconds 4/23: 45 seconds    Time  8    Period  Weeks    Status  Achieved      PT LONG TERM GOAL #4   Title  Pt will have improved 5 Times Sit to Stand score to at least 20 seconds or better, indicating decreased fall risk since time of initial evaluation.    Baseline  Pt performed 5 Times Sit to Stand in 37 seconds at initial evaluation. 3/29; 26.07 seconds w/o UE support (holding onto rainbow ball) 4/23: 20 seconds with hands on knees, unable to perform without UE support. 5/13: 16 seconds with hands on knees, not able to fully stand final stand  Time  8    Period  Weeks    Status  Partially Met    Target Date   10/27/19      PT LONG TERM GOAL #5   Title  Pt will ascend/ descend 4 stairs safely with use of B handrails with SB/CGA to decrease fall risk.    Baseline  Pt currently trips of the 4 steps he uses to enter/exit his home per wife's report. 3/29: one step at a time with lateral steppage descending 4/23: deferred due to cognition/confusion    Time  8    Period  Weeks    Status  Partially Met    Target Date  10/27/19      PT LONG TERM GOAL #6   Title  Pt will have improved Timed Up and Go score to at least 20 seconds or better, indicating decreased fall risk since initial PT evaluation.    Baseline  4/23: 45 seconds without AD. 5/13; 49 seconds with RW    Time  8    Period  Weeks    Status  On-going    Target Date  10/27/19            Plan - 10/17/19 1436    Clinical Impression Statement  Patient demonstrates slow gait speed with irregular steps and stops.Patient has back pain during walking and needs to have a seated rest to ease back pain.  Patient will continue to benefit from skilled therapy in order to improve dynamic standing balance and endurance.   Personal Factors and Comorbidities  Age;Comorbidity 3+;Fitness;Past/Current Experience;Time since onset of injury/illness/exacerbation    Comorbidities  balance, left homonymous hemianopsia, CKD, HLD, Hypothyroidism, osteoprosis,    Examination-Activity Limitations  Bathing;Caring for Others;Carry;Dressing;Locomotion Level;Lift;Reach Overhead;Squat;Stand;Transfers;Stairs    Examination-Participation Restrictions  Church;Cleaning;Community Activity;Driving;Interpersonal Relationship;Laundry;Volunteer;Shop;School;Meal Prep;Yard Work    [employer] officer  Evolving/Moderate complexity    Rehab Potential  Fair    PT Frequency  2x / week    PT Duration  8 weeks    PT Treatment/Interventions  ADLs/Self Care Home Management;Aquatic Therapy;Biofeedback;Cryotherapy;Electrical Stimulation;Iontophoresis 28m/ml  Dexamethasone;Moist Heat;Traction;Ultrasound;Parrafin;Therapeutic exercise;Therapeutic activities;Functional mobility training;Stair training;Gait training;DME Instruction;Balance training;Neuromuscular re-education;Patient/family education;Orthotic Fit/Training;Cognitive remediation;Manual techniques;Passive range of motion;Energy conservation;Splinting;Taping;Vestibular;Visual/perceptual remediation/compensation    Consulted and Agree with Plan of Care  Patient;Family member/caregiver    Family Member Consulted  Wife       Patient will benefit from skilled therapeutic intervention in order to improve the following deficits and impairments:  Abnormal gait, Cardiopulmonary status limiting activity, Decreased activity tolerance, Decreased balance, Decreased knowledge of precautions, Decreased endurance, Decreased coordination, Decreased cognition, Decreased knowledge of use of DME, Decreased mobility, Decreased safety awareness, Difficulty walking, Decreased strength, Impaired flexibility, Impaired perceived functional ability, Impaired UE functional use, Impaired tone, Postural dysfunction, Improper body mechanics  Visit Diagnosis: Muscle weakness (generalized)  Difficulty walking     Problem List Patient Active Problem List   Diagnosis Date Noted   Oropharyngeal dysphagia 06/21/2019   Moderate protein-calorie malnutrition ([facility] 06/21/2019   Balance disorder 06/21/2019   Left homonymous hemianopsia 06/21/2019   Pain due to onychomycosis of toenails of both feet 01/03/2019   Lewy body dementia without behavioral disturbance ([facility] 07/21/2017   Elevated PSA 12/09/2016   Vitamin D deficiency 12/09/2016   Chronic pain of right knee 11/04/2016   Hyperlipidemia 11/04/2016   CKD (chronic kidney disease), stage III 11/04/2016   Osteoarthritis of multiple joints 11/03/2016   Chronic right hip pain 11/03/2016   BPH with obstruction/lower urinary tract symptoms 11/03/2016  Hypothyroidism 11/03/2014   Asthma 11/03/2014   Osteoporosis 11/03/2014   Skin lesion of chest wall 11/03/2014   Trigger finger of both hands 11/03/2014    Alanson Puls , [place] DPT 10/17/2019, 2:36 PM  [place] MAIN [facility] SERVICES [address] [place], [place], [address] Phone: [phone]   Fax:  [phone]  Name: Blake Bates MRN: [id number] Date of Birth: 11-18-1940

## 2019-10-20 ENCOUNTER — Ambulatory Visit: Payer: Medicare Other

## 2019-10-25 ENCOUNTER — Ambulatory Visit: Payer: Medicare Other

## 2019-10-25 ENCOUNTER — Other Ambulatory Visit: Payer: Self-pay

## 2019-10-25 DIAGNOSIS — R262 Difficulty in walking, not elsewhere classified: Secondary | ICD-10-CM

## 2019-10-25 DIAGNOSIS — M6281 Muscle weakness (generalized): Secondary | ICD-10-CM | POA: Diagnosis not present

## 2019-10-25 NOTE — Therapy (Signed)
[place] MAIN [facility] SERVICES [address] [place], [place], [address] Phone: [phone]   Fax:  [phone]  Physical Therapy Treatment  Patient Details  Name: Blake Bates MRN: [id number] Date of Birth: 08-22-1940 No data recorded  Encounter Date: 10/25/2019   PT End of Session - 10/25/19 1351    Visit Number 26    Number of Visits 32    Date for PT Re-Evaluation 10/27/19    Authorization Type 6/10 PN 09/22/19    PT Start Time 1344    PT Stop Time 1429    PT Time Calculation (min) 45 min    Equipment Utilized During Treatment Gait belt;Other (comment)   FWW   Activity Tolerance Patient tolerated treatment well    Behavior During Therapy WFL for tasks assessed/performed           Past Medical History:  Diagnosis Date  . Asthma   . Diverticulosis   . Full dentures   . Hyperlipidemia   . Hypothyroidism    s/p subtotal thyroidectomy  . Muscle spasms of lower extremity    Right upper leg  . Osteoporosis   . Rhinitis, allergic    pollens, mold, animal dander    Past Surgical History:  Procedure Laterality Date  . CATARACT EXTRACTION W/PHACO Left 09/20/2014   Procedure: CATARACT EXTRACTION PHACO AND INTRAOCULAR LENS PLACEMENT (IOC);  Surgeon: Leandrew Koyanagi, MD;  Location: [place];  Service: Ophthalmology;  Laterality: Left;  . COLONOSCOPY    . PARATHYROIDECTOMY    . THYROID SURGERY     subtotal    There were no vitals filed for this visit.   Subjective Assessment - 10/25/19 1349    Subjective Patient's wife reports patient has been appearing weaker at home lately. Still is not eating.    Patient is accompained by: Family member    Pertinent History Pt has Lewy Body dementia and Parkinson's disease, and has difficulty walking due to weakness and balance deficits, as well as difficulty with motor planning and decision making.  Pt is a high fall risk and has fallen 5x in the past 2 weeks.    Limitations  Standing;Walking;House hold activities    How long can you sit comfortably? unlimited    How long can you stand comfortably? 5 minutes    How long can you walk comfortably? 2-3 minutes, must hold onto things due to weakness and imbalance    Patient Stated Goals "to walk better, and feel stronger"    Currently in Pain? No/denies           125/107 L arm:  100/67  R arm.   Second set: 143/112 L arm 127/73 R arm     Treatment:  Neuromuscular Re-education cues for safety awareness, CGA for all standing interventions.    reach laterally to grab ball, toss into hoop, cues to bring self closer to hoop, able to reach up to "dunk" ball in with increasing upright posture x 20   stair negotiation: use of stick notes for visual cue of foot placement, max cueing for sequencing, hand placement on railing, and foot placement. Descending patient is very unstable with LLE externally rotated despite multimodal cueing. X 3 trials up/down     TherEx cues for body mechanics, sequencing and task orientation   Nustep Lvl 1 RPM> 40 for cardiovascular and muscuoskeletal challenge Ambulate 96 ft with RW and Min A for negotiation of RW and object negotiation, max cueing for sequencing. Decreased challenge  with turning to the right than to the left. X 4 trials with seated rest breaks 3lb ankle weight: Marches 15x to PT hand   3 ankle weight seated: -LAQ to PT hand for visual cue 10x each LE -hip flexion to PT hand for visual cue 10x each LE     Patient requires frequent task orientation and sequencing cues for memory. Use of visual demonstration in combination to verbal cues results in best response. Frequent cues are required for L visual field due to L hemi-neglect.     Pt educated throughout session about proper posture and technique with exercises. Improved exercise technique, movement at target joints, use of target muscles after min to mod verbal, visual, tactile  cues.                         PT Education - 10/25/19 1351    Education Details exercise technique, body mechanics    Person(s) Educated Patient    Methods Explanation;Demonstration;Tactile cues;Verbal cues    Comprehension Verbalized understanding;Returned demonstration;Verbal cues required;Tactile cues required            PT Short Term Goals - 09/22/19 1359      PT SHORT TERM GOAL #1   Title Pt and caregiver will be educated in a new HEP for overall LE and UE strengthening, and kitchen sink balance exercises to decrease fall risk.    Baseline No current exercises per caregiver 3/29: occasionally compliant with HEP 4/23: caregiver challenged to help due to patient cognition 5/13: improving with compliance    Time 2    Period Weeks    Status Partially Met    Target Date 10/06/19      PT SHORT TERM GOAL #2   Title Pt and wife will be educated in proper use of assistive devices and safety techniques at home (increased lighting, removal of area rugs etc) to decrease fall risk    Baseline Currently no education on home equipment and not using assistive devices. 3/29: challenged with compliance with use of AD 4/23: was utilizing rollator prior to sudden cognitive decline 5/13: utilizing RW occasionally    Time 2    Period Weeks    Status Partially Met    Target Date 10/06/19             PT Long Term Goals - 09/22/19 1401      PT LONG TERM GOAL #1   Title Pt will have increased overall LE gross strength by at least 1/2-1 muscle grade throughout to decrease his fall risk and improve his ability to ambulate further distances safely.    Baseline Pt currently at 2+ to 3+/5 grossly throughout UE's, and 3 to 4/5 grossly throughout LE's (see objective notes) 3/29: 4-/5 LE strength 4/23: grossly 3+/5; 5/13: grossly 4-/5    Time 8    Period Weeks    Status Partially Met    Target Date 10/27/19      PT LONG TERM GOAL #2   Title Pt's caregiver will report no greater  than 0-1 falls over 1 week period due to improved balance and strength, and improved saftey techniques including assistive device.    Baseline Pt currently falling about 5 times over 2-week period 3/29: 1 fall since starting therapy (happened in past week) 4/23: no falls in 3 weeks    Time 8    Period Weeks    Status Achieved      PT LONG TERM GOAL #  3   Title Pt will have improved Timed Up and Go score to at least 1 minute or better, indicating decreased fall risk since initial PT evaluation.    Baseline Pt performed TUG in 1 minute and 30 seconds at initial evaluation. 3/29: 29.7 seconds 4/23: 45 seconds    Time 8    Period Weeks    Status Achieved      PT LONG TERM GOAL #4   Title Pt will have improved 5 Times Sit to Stand score to at least 20 seconds or better, indicating decreased fall risk since time of initial evaluation.    Baseline Pt performed 5 Times Sit to Stand in 37 seconds at initial evaluation. 3/29; 26.07 seconds w/o UE support (holding onto rainbow ball) 4/23: 20 seconds with hands on knees, unable to perform without UE support. 5/13: 16 seconds with hands on knees, not able to fully stand final stand    Time 8    Period Weeks    Status Partially Met    Target Date 10/27/19      PT LONG TERM GOAL #5   Title Pt will ascend/ descend 4 stairs safely with use of B handrails with SB/CGA to decrease fall risk.    Baseline Pt currently trips of the 4 steps he uses to enter/exit his home per wife's report. 3/29: one step at a time with lateral steppage descending 4/23: deferred due to cognition/confusion    Time 8    Period Weeks    Status Partially Met    Target Date 10/27/19      PT LONG TERM GOAL #6   Title Pt will have improved Timed Up and Go score to at least 20 seconds or better, indicating decreased fall risk since initial PT evaluation.    Baseline 4/23: 45 seconds without AD. 5/13; 49 seconds with RW    Time 8    Period Weeks    Status On-going    Target Date  10/27/19                 Plan - 10/25/19 1735    Clinical Impression Statement Patient presents with increased confusion and agitation this session. He is challenged with safety awareness with coordination of use of RW requiring Min a for ambulation this session. Wife educated on need to monitor BP. Patient will continue to benefit from skilled therapy in order to improve dynamic standing balance and endurance.    Personal Factors and Comorbidities Age;Comorbidity 3+;Fitness;Past/Current Experience;Time since onset of injury/illness/exacerbation    Comorbidities balance, left homonymous hemianopsia, CKD, HLD, Hypothyroidism, osteoprosis,    Examination-Activity Limitations Bathing;Caring for Others;Carry;Dressing;Locomotion Level;Lift;Reach Overhead;Squat;Stand;Transfers;Stairs    Examination-Participation Restrictions Church;Cleaning;Community Activity;Driving;Interpersonal Relationship;Laundry;Volunteer;Shop;School;Meal Prep;Yard Work    [employer] officer Evolving/Moderate complexity    Rehab Potential Fair    PT Frequency 2x / week    PT Duration 8 weeks    PT Treatment/Interventions ADLs/Self Care Home Management;Aquatic Therapy;Biofeedback;Cryotherapy;Electrical Stimulation;Iontophoresis 13m/ml Dexamethasone;Moist Heat;Traction;Ultrasound;Parrafin;Therapeutic exercise;Therapeutic activities;Functional mobility training;Stair training;Gait training;DME Instruction;Balance training;Neuromuscular re-education;Patient/family education;Orthotic Fit/Training;Cognitive remediation;Manual techniques;Passive range of motion;Energy conservation;Splinting;Taping;Vestibular;Visual/perceptual remediation/compensation    Consulted and Agree with Plan of Care Patient;Family member/caregiver    Family Member Consulted Wife           Patient will benefit from skilled therapeutic intervention in order to improve the following deficits and impairments:  Abnormal gait, Cardiopulmonary  status limiting activity, Decreased activity tolerance, Decreased balance, Decreased knowledge of precautions, Decreased endurance, Decreased coordination, Decreased cognition, Decreased knowledge of use of DME,  Decreased mobility, Decreased safety awareness, Difficulty walking, Decreased strength, Impaired flexibility, Impaired perceived functional ability, Impaired UE functional use, Impaired tone, Postural dysfunction, Improper body mechanics  Visit Diagnosis: Muscle weakness (generalized)  Difficulty walking     Problem List Patient Active Problem List   Diagnosis Date Noted  . Oropharyngeal dysphagia 06/21/2019  . Moderate protein-calorie malnutrition ([place]) 06/21/2019  . Balance disorder 06/21/2019  . Left homonymous hemianopsia 06/21/2019  . Pain due to onychomycosis of toenails of both feet 01/03/2019  . Lewy body dementia without behavioral disturbance ([place]) 07/21/2017  . Elevated PSA 12/09/2016  . Vitamin D deficiency 12/09/2016  . Chronic pain of right knee 11/04/2016  . Hyperlipidemia 11/04/2016  . CKD (chronic kidney disease), stage III 11/04/2016  . Osteoarthritis of multiple joints 11/03/2016  . Chronic right hip pain 11/03/2016  . 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 Hypothyroidism 11/03/2014  . Asthma 11/03/2014  . Osteoporosis 11/03/2014  . Skin lesion of chest wall 11/03/2014  . Trigger finger of both hands 11/03/2014   Janna Arch, PT, DPT   10/25/2019, 5:36 PM  [place] MAIN [facility] SERVICES [address] [place], [place], [address] Phone: [phone]   Fax:  [phone]  Name: Blake Bates MRN: [id number] Date of Birth: 12-28-1940

## 2019-10-27 ENCOUNTER — Other Ambulatory Visit: Payer: Self-pay

## 2019-10-27 ENCOUNTER — Telehealth: Payer: Self-pay | Admitting: Family Medicine

## 2019-10-27 ENCOUNTER — Ambulatory Visit: Payer: Medicare Other

## 2019-10-27 DIAGNOSIS — R262 Difficulty in walking, not elsewhere classified: Secondary | ICD-10-CM

## 2019-10-27 DIAGNOSIS — M6281 Muscle weakness (generalized): Secondary | ICD-10-CM

## 2019-10-27 NOTE — Telephone Encounter (Signed)
Received notification from Janna Arch PT at [facility] that patient would benefit from transition from Ambulatory PT to [place] PT OT orders due to cognitive decline and for reduction of falls, improved mobility and decreased caregiver burden.  I have called his wife, Hoang Pettingill, and spoke with her today. She will call [facility] insurance to check which agency is preferred or covered and she will let us know which [place] to request.  I'll place the order to start [facility] PT OT once she responds.  I advised her that she will need either a MyChart Video Visit or a Face to Face office visit with [facility] so we can document the new Home Health orders within 30 day of them starting services.  She will schedule MyChart Video visit when ready.  [name], [facility] 10/27/2019, 3:17 PM

## 2019-10-27 NOTE — Therapy (Signed)
[place] MAIN [facility] SERVICES [address] [place], [place], [address] Phone: [phone]   Fax:  [phone]  Physical Therapy Treatment/Discharge  Patient Details  Name: ANTOINNE Bates MRN: [id number] Date of Birth: [birth year] No data recorded  Encounter Date: 10/27/2019   PT End of Session - 10/27/19 1353    Visit Number 27    Number of Visits 32    Date for PT Re-Evaluation 10/27/19    Authorization Type 7/10 PN 09/22/19    PT Start Time 1300    PT Stop Time 1333    PT Time Calculation (min) 33 min    Equipment Utilized During Treatment Gait belt;Other (comment)   FWW   Activity Tolerance Patient tolerated treatment well    Behavior During Therapy WFL for tasks assessed/performed           Past Medical History:  Diagnosis Date   Asthma    Diverticulosis    Full dentures    Hyperlipidemia    Hypothyroidism    s/p subtotal thyroidectomy   Muscle spasms of lower extremity    Right upper leg   Osteoporosis    Rhinitis, allergic    pollens, mold, animal dander    Past Surgical History:  Procedure Laterality Date   CATARACT EXTRACTION W/PHACO Left 09/20/2014   Procedure: CATARACT EXTRACTION PHACO AND INTRAOCULAR LENS PLACEMENT ([place]);  Surgeon: Leandrew Koyanagi, MD;  Location: [place];  Service: Ophthalmology;  Laterality: Left;   COLONOSCOPY     PARATHYROIDECTOMY     THYROID SURGERY     subtotal    There were no vitals filed for this visit.   Subjective Assessment - 10/27/19 1353    Subjective Patient's wife reports patient is eating some cheerios and oreos but still not consistantly. Little to no carryover of tasks at home.    Patient is accompained by: Family member    Pertinent History Pt has Lewy Body dementia and Parkinson's disease, and has difficulty walking due to weakness and balance deficits, as well as difficulty with motor planning and decision making.  Pt is a high fall risk and  has fallen 5x in the past 2 weeks.    Limitations Standing;Walking;House hold activities    How long can you sit comfortably? unlimited    How long can you stand comfortably? 5 minutes    How long can you walk comfortably? 2-3 minutes, must hold onto things due to weakness and imbalance    Patient Stated Goals "to walk better, and feel stronger"    Currently in Pain? No/denies                  Goals:  Strength: grossly 4-/5  5xSTS: 18.02 with hands on walker ; 17.05 with hands on chair. Max cueing for sequencing required 4 steps; one step at a time with lateral/diagonal shift TUG:1 min 22 seconds with RW first trial 1 min 8 seconds second trial; very challenging turning, attempted without AD with Hand hold assistance without success  FOTO 34 %    Patient and patient's spouse educated on home health referral process and agreeable to new POC.   Patient would benefit from referral to home health for occupation and physical therapy due to cognitive decline, limited carryover from therapy session in clinic to home to allow for reduction of falls, improved mobility and decreased caregiver burden. Due to limited progression and decreased cognition I will recommend discharge from outpatient in favor of patient getting referral  to home health. I will be happy to see this patient again in the future as needed.                   PT Short Term Goals - 10/27/19 1355      PT SHORT TERM GOAL #1   Title Pt and caregiver will be educated in a new HEP for overall LE and UE strengthening, and kitchen sink balance exercises to decrease fall risk.    Baseline No current exercises per caregiver 3/29: occasionally compliant with HEP 4/23: caregiver challenged to help due to patient cognition 5/13: improving with compliance 6/17: occaisonal compliance but rare    Time 2    Period Weeks    Status Not Met    Target Date 10/06/19      PT SHORT TERM GOAL #2   Title Pt and wife will be  educated in proper use of assistive devices and safety techniques at home (increased lighting, removal of area rugs etc) to decrease fall risk    Baseline Currently no education on home equipment and not using assistive devices. 3/29: challenged with compliance with use of AD 4/23: was utilizing rollator prior to sudden cognitive decline 5/13: utilizing RW occasionally 6/17: patient is limited in compliance with RW    Time 2    Period Weeks    Status Not Met    Target Date 10/06/19             PT Long Term Goals - 10/27/19 1358      PT LONG TERM GOAL #1   Title Pt will have increased overall LE gross strength by at least 1/2-1 muscle grade throughout to decrease his fall risk and improve his ability to ambulate further distances safely.    Baseline Pt currently at 2+ to 3+/5 grossly throughout UE's, and 3 to 4/5 grossly throughout LE's (see objective notes) 3/29: 4-/5 LE strength 4/23: grossly 3+/5; 5/13: grossly 4-/5 6/17: 4-/5 grossly    Time 8    Period Weeks    Status Partially Met    Target Date 10/27/19      PT LONG TERM GOAL #2   Title Pt's caregiver will report no greater than 0-1 falls over 1 week period due to improved balance and strength, and improved saftey techniques including assistive device.    Baseline Pt currently falling about 5 times over 2-week period 3/29: 1 fall since starting therapy (happened in past week) 4/23: no falls in 3 weeks    Time 8    Period Weeks    Status Achieved      PT LONG TERM GOAL #3   Title Pt will have improved Timed Up and Go score to at least 1 minute or better, indicating decreased fall risk since initial PT evaluation.    Baseline Pt performed TUG in 1 minute and 30 seconds at initial evaluation. 3/29: 29.7 seconds 4/23: 45 seconds    Time 8    Period Weeks    Status Achieved      PT LONG TERM GOAL #4   Title Pt will have improved 5 Times Sit to Stand score to at least 20 seconds or better, indicating decreased fall risk since  time of initial evaluation.    Baseline Pt performed 5 Times Sit to Stand in 37 seconds at initial evaluation. 3/29; 26.07 seconds w/o UE support (holding onto rainbow ball) 4/23: 20 seconds with hands on knees, unable to perform without UE support. 5/13: 16  seconds with hands on knees, not able to fully stand final stand 6/17 18.02 with hands on walker 17.05 with hands on chair    Time 8    Period Weeks    Status Not Met    Target Date 10/27/19      PT LONG TERM GOAL #5   Title Pt will ascend/ descend 4 stairs safely with use of B handrails with SB/CGA to decrease fall risk.    Baseline Pt currently trips of the 4 steps he uses to enter/exit his home per wife's report. 3/29: one step at a time with lateral steppage descending 4/23: deferred due to cognition/confusion 6/17: lateral stepping with step to pattern, unstable    Time 8    Period Weeks    Status Not Met    Target Date 10/27/19      PT LONG TERM GOAL #6   Title Pt will have improved Timed Up and Go score to at least 20 seconds or better, indicating decreased fall risk since initial PT evaluation.    Baseline 4/23: 45 seconds without AD. 5/13; 49 seconds with RW 6/17: 1 min 8 seconds    Time 8    Period Weeks    Status Not Met    Target Date 10/27/19                 Plan - 10/27/19 1354    Clinical Impression Statement Patient would benefit from referral to home health for occupation and physical therapy due to cognitive decline, limited carryover from therapy session in clinic to home to allow for reduction of falls, improved mobility and decreased caregiver burden. Due to limited progression and decreased cognition I will recommend discharge from outpatient in favor of patient getting referral to home health. I will be happy to see this patient again in the future as needed.    Personal Factors and Comorbidities Age;Comorbidity 3+;Fitness;Past/Current Experience;Time since onset of injury/illness/exacerbation     Comorbidities balance, left homonymous hemianopsia, CKD, HLD, Hypothyroidism, osteoprosis,    Examination-Activity Limitations Bathing;Caring for Others;Carry;Dressing;Locomotion Level;Lift;Reach Overhead;Squat;Stand;Transfers;Stairs    Examination-Participation Restrictions Church;Cleaning;Community Activity;Driving;Interpersonal Relationship;Laundry;Volunteer;Shop;School;Meal Prep;Yard Work    [employer] officer Evolving/Moderate complexity    Rehab Potential Fair    PT Frequency 2x / week    PT Duration 8 weeks    PT Treatment/Interventions ADLs/Self Care Home Management;Aquatic Therapy;Biofeedback;Cryotherapy;Electrical Stimulation;Iontophoresis 60m/ml Dexamethasone;Moist Heat;Traction;Ultrasound;Parrafin;Therapeutic exercise;Therapeutic activities;Functional mobility training;Stair training;Gait training;DME Instruction;Balance training;Neuromuscular re-education;Patient/family education;Orthotic Fit/Training;Cognitive remediation;Manual techniques;Passive range of motion;Energy conservation;Splinting;Taping;Vestibular;Visual/perceptual remediation/compensation    Consulted and Agree with Plan of Care Patient;Family member/caregiver    Family Member Consulted Wife           Patient will benefit from skilled therapeutic intervention in order to improve the following deficits and impairments:  Abnormal gait, Cardiopulmonary status limiting activity, Decreased activity tolerance, Decreased balance, Decreased knowledge of precautions, Decreased endurance, Decreased coordination, Decreased cognition, Decreased knowledge of use of DME, Decreased mobility, Decreased safety awareness, Difficulty walking, Decreased strength, Impaired flexibility, Impaired perceived functional ability, Impaired UE functional use, Impaired tone, Postural dysfunction, Improper body mechanics  Visit Diagnosis: Muscle weakness (generalized)  Difficulty walking     Problem List Patient Active Problem  List   Diagnosis Date Noted   Oropharyngeal dysphagia 06/21/2019   Moderate protein-calorie malnutrition ([place] 06/21/2019   Balance disorder 06/21/2019   Left homonymous hemianopsia 06/21/2019   Pain due to onychomycosis of toenails of both feet 01/03/2019   [place] behavioral disturbance [place] 07/21/2017   Elevated PSA  12/09/2016   Vitamin D deficiency 12/09/2016   Chronic pain of right knee 11/04/2016   Hyperlipidemia 11/04/2016   CKD (chronic kidney disease), stage III 11/04/2016   Osteoarthritis of multiple joints 11/03/2016   Chronic right hip pain 11/03/2016   BPH with obstruction/lower urinary tract symptoms 11/03/2016   Hypothyroidism 11/03/2014   Asthma 11/03/2014   Osteoporosis 11/03/2014   Skin lesion of chest wall 11/03/2014   Trigger finger of both hands 11/03/2014   Janna Arch, PT, DPT   10/27/2019, 2:01 PM  [place] MAIN [facility] SERVICES [address] [place], [place], [address] Phone: [phone]   Fax:  [phone]  Name: Blake Bates MRN: [id number] Date of Birth: Dec 30, 1940

## 2019-10-31 ENCOUNTER — Encounter: Payer: Self-pay | Admitting: Family Medicine

## 2019-10-31 ENCOUNTER — Telehealth (INDEPENDENT_AMBULATORY_CARE_PROVIDER_SITE_OTHER): Payer: Medicare Other | Admitting: Family Medicine

## 2019-10-31 VITALS — BP 120/60 | Ht 70.0 in | Wt 135.0 lb

## 2019-10-31 DIAGNOSIS — Z8673 Personal history of transient ischemic attack (TIA), and cerebral infarction without residual deficits: Secondary | ICD-10-CM

## 2019-10-31 DIAGNOSIS — R2689 Other abnormalities of gait and mobility: Secondary | ICD-10-CM

## 2019-10-31 DIAGNOSIS — R296 Repeated falls: Secondary | ICD-10-CM

## 2019-10-31 DIAGNOSIS — G3183 Dementia with Lewy bodies: Secondary | ICD-10-CM

## 2019-10-31 DIAGNOSIS — M6281 Muscle weakness (generalized): Secondary | ICD-10-CM | POA: Diagnosis not present

## 2019-10-31 DIAGNOSIS — F0280 Dementia in other diseases classified elsewhere without behavioral disturbance: Secondary | ICD-10-CM

## 2019-10-31 NOTE — Progress Notes (Addendum)
Subjective:    Patient ID: Blake Bates, male    DOB: March 09, 1941, 79 y.o.   MRN: [id number]  Blake Bates is a 79 y.o. male presenting on 10/31/2019 for Generalized Weakness, Falls, History of Stroke, Dementia  Virtual / Telehealth Encounter - Video Visit via MyChart The purpose of this virtual visit is to provide medical care while limiting exposure to the novel coronavirus (COVID19) for both patient and office staff.  Consent was obtained for remote visit:  Yes.   Answered questions that patient had about telehealth interaction:  Yes.   I discussed the limitations, risks, security and privacy concerns of performing an evaluation and management service by video/telephone. I also discussed with the patient that there may be a patient responsible charge related to this service. The patient expressed understanding and agreed to proceed.  Patient Location: Home Provider Location: [facility] (Office)   Accompanied by wife Lindia during video visit, she provides history, as patient has Dementia.   HPI   Generalized Weakness / Recurrent Falls Balance Disorder /Imbalance/ Lefthomonymoushemianopia Lewy Body Dementia - Seeprior detailed HPI prior notes forLewy Body Dementia likely contributing.Followed by Dr [name] Neurology. Known prior history of CVA affecting as well. Last apt with neurology 09/07/2019. He is managed on Aricept 10mg , and was added Mirtazapine 7.5mg  nightly for improve appetite and also for mood., he has thought to have some parkinsonian features with his gait and stiffness with walking, and also some tremor - He had continued on neuro guided PT outpatient for balance and gait, especially lately after new med mirtazapine has more fluid movement by report  Interval updates - he has continued PT however, ultimately he has not demonstrated any further improvement with outpatient ambulatory PT and there were concerns with traveling to  ambulatory setting for higher risk patient, and PT has recommended transition to Home health PT / OT services and patient and caregiver/spouse are interested in this next step.  He has difficulty leaving home due to cognitive/dementia and balance and high fall risk. Requires assistance.  He is needing increased assistance at home with ADLs and wife is helping with those but they have concerns about safety of in and out of tub / bath and steps at home.  They report today continued generalized weakness, problem with recurrent falls, and continued complicated balance and gait, with known Lewy Body Dementia and balance disorder as managed by Neurology  Denies any acute confusion, new episodes of wandering, concern for safety, fall or injury  Additional PMH - with weight loss and Moderate Protein Calorie Malnutrition as well. 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    Depression screen [facility] 2/9 09/19/2019 06/21/2019 06/21/2019  Decreased Interest 0 0 0  Down, Depressed, Hopeless 0 0 0  PHQ - 2 Score 0 0 0  Altered sleeping - - -  Tired, decreased energy - - -  Change in appetite - - -  Feeling bad or failure about yourself  - - -  Trouble concentrating - - -  Moving slowly or fidgety/restless - - -  Suicidal thoughts - - -  PHQ-9 Score - - -  Difficult doing work/chores - - -    Social  History   Tobacco Use  . Smoking status: Former Smoker    Packs/day: 0.00    Years: 60.00    Pack years: 0.00    Types: Pipe    Quit date: 05/12/2016    Years since quitting: 3.4  . Smokeless tobacco: Former Systems developer  . Tobacco comment: Self taper down from full pipe daily down to half to quarter pipe    Vaping Use  . Vaping Use: Never used  Substance Use Topics  . Alcohol use: Not Currently    Comment: occasionally beer or wine   . Drug use: No    Review of Systems Per HPI unless specifically indicated above     Objective:    BP 120/60 (BP Location: Left Arm, Patient Position: Sitting)   Ht 5\' 10"  (1.778 m)   Wt 135 lb (61.2 kg)   BMI 19.37 kg/m   Wt Readings from Last 3 Encounters:  10/06/19 135 lb (61.2 kg)  09/19/19 137 lb (62.1 kg)  09/07/19 133 lb 12.8 oz (60.7 kg)    Physical Exam   Note examination was completely remotely via video observation objective data only  Gen - well-appearing, no acute distress or apparent pain, comfortable HEENT - eyes appear clear without discharge or redness Heart/Lungs - cannot examine virtually - observed no evidence of coughing or labored breathing. Skin - face visible today- no rash Neuro - awake, alert, oriented Psych - not anxious appearing. Well groomed, good eye contact, limited speech and participation in conversation, which is somewhat normal for him at baseline, he is more active and participating during in person visit, may have had difficulty hearing on the virtual visit   Results for orders placed or performed in visit on 05/23/19  PSA  Result Value Ref Range   PSA 3.9 < OR = 4.0 ng/mL  BASIC METABOLIC PANEL WITH GFR  Result Value Ref Range   Glucose, Bld 84 65 - 99 mg/dL   BUN 18 7 - 25 mg/dL   Creat 1.42 (H) 0.70 - 1.18 mg/dL   GFR, Est Non African American 47 (L) > OR = 60 mL/min/1.30m2   GFR, Est African American 54 (L) > OR = 60 mL/min/1.59m2   BUN/Creatinine Ratio 13 6 - 22 (calc)   Sodium 138 135 - 146 mmol/L   Potassium 4.2 3.5 - 5.3 mmol/L   Chloride 101 98 - 110 mmol/L   CO2 30 20 - 32 mmol/L   Calcium 9.2 8.6 - 10.3 mg/dL      Assessment & Plan:   Problem List Items Addressed This Visit    Recurrent falls   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  Patient is high risk for falls and has multiple medical co morbidities that impact him leaving home safely without assistance.  Proceed with referral to [facility] is requested by patient/spouse, for PT, OT, Home Aide  He will benefit from safety evaluation at home and using assistance devices at home, may need new orders on  DME home equipment.  He may benefit from continued PT/OT with goal of strengthening and balance.  Continues on treatment per Neurology Dr Delice Lesch for dementia and balance.  Future we discussed options of getting more community resources / caregiver support - we can refer to our CCM team and work on [employer] if he qualifies and other assistance.  No orders of the defined types were placed in this encounter.  Orders Placed This Encounter  Procedures  . Ambulatory referral to Home Health    Referral Priority:   Routine    Referral Type:   Home Health Care    Referral Reason:   Specialty Services Required    Requested Specialty:   [place]    Number of Visits Requested:   1     Follow up plan: Return if symptoms worsen or fail to improve.   Patient verbalizes understanding with the above medical recommendations including the limitation of remote medical advice.  Specific follow-up and call-back criteria were given for patient to follow-up or seek medical care more urgently if needed.  Total duration of direct patient care provided via video  conference: 15 minutes    [name], [facility] 10/31/2019, 3:17 PM

## 2019-10-31 NOTE — Patient Instructions (Addendum)
Referral to [facility]  Future we can consider other community resources and caregivers if needed  They should contact you within 7 days if not heard back can call them or [place] or send message  Please schedule a Follow-up Appointment to: Return if symptoms worsen or fail to improve.  If you have any other questions or concerns, please feel free to call the office or send a message through [place]. You may also schedule an earlier appointment if necessary.  Additionally, you may be receiving a survey about your experience at our office within a few days to 1 week by e-mail or mail. We value your feedback.  [name], DO [facility] [facility]

## 2019-11-01 ENCOUNTER — Ambulatory Visit: Payer: Medicare Other

## 2019-11-03 ENCOUNTER — Ambulatory Visit: Payer: Medicare Other

## 2019-11-07 ENCOUNTER — Telehealth: Payer: Self-pay

## 2019-11-07 NOTE — Telephone Encounter (Signed)
[place] like we cannot use [facility] right now. I don't see any information in the CRM about when they may be able to start services or take patient in future, so to me that is not reassuring.  I have sent a detailed MyChart message to the patient/wife, Blake Bates and asking her opinion on what to do next.  Once I hear back from her, she may request a different company we can place new orders.  No further action needed on our end at the moment.  Blake Bates, [facility] 11/07/2019, 5:40 PM

## 2019-11-07 NOTE — Telephone Encounter (Signed)
Copied from [place] [id number]. Topic: General - Inquiry >> Nov 07, 2019  3:52 PM Greggory Keen D wrote: Reason for CRM: Santiago Glad with [facility] called to say they received a referral from [facility] but they can not take anymore patients right now.  CB#  [phone]

## 2019-11-08 ENCOUNTER — Ambulatory Visit: Payer: Medicare Other

## 2019-11-10 ENCOUNTER — Ambulatory Visit: Payer: Medicare Other

## 2019-11-15 ENCOUNTER — Ambulatory Visit: Payer: Medicare Other

## 2019-11-17 ENCOUNTER — Ambulatory Visit: Payer: Medicare Other

## 2019-11-21 ENCOUNTER — Ambulatory Visit: Payer: Medicare Other

## 2019-11-23 ENCOUNTER — Ambulatory Visit: Payer: Medicare Other

## 2019-11-28 ENCOUNTER — Ambulatory Visit: Payer: Medicare Other

## 2019-11-30 ENCOUNTER — Ambulatory Visit: Payer: Medicare Other

## 2019-12-05 ENCOUNTER — Ambulatory Visit: Payer: Medicare Other

## 2019-12-06 ENCOUNTER — Other Ambulatory Visit: Payer: Self-pay

## 2019-12-06 MED ORDER — DONEPEZIL HCL 10 MG PO TABS: ORAL_TABLET | ORAL | 0 refills | Status: DC

## 2019-12-07 ENCOUNTER — Ambulatory Visit: Payer: Medicare Other

## 2019-12-08 ENCOUNTER — Ambulatory Visit: Payer: Medicare Other | Admitting: Neurology

## 2019-12-08 ENCOUNTER — Encounter: Payer: Self-pay | Admitting: Neurology

## 2019-12-08 ENCOUNTER — Other Ambulatory Visit: Payer: Self-pay

## 2019-12-08 VITALS — BP 145/78 | HR 88 | Wt 130.0 lb

## 2019-12-08 DIAGNOSIS — M6281 Muscle weakness (generalized): Secondary | ICD-10-CM

## 2019-12-08 DIAGNOSIS — F0281 Dementia in other diseases classified elsewhere with behavioral disturbance: Secondary | ICD-10-CM

## 2019-12-08 DIAGNOSIS — G3183 Dementia with Lewy bodies: Secondary | ICD-10-CM | POA: Diagnosis not present

## 2019-12-08 DIAGNOSIS — R2689 Other abnormalities of gait and mobility: Secondary | ICD-10-CM

## 2019-12-08 DIAGNOSIS — Z8673 Personal history of transient ischemic attack (TIA), and cerebral infarction without residual deficits: Secondary | ICD-10-CM

## 2019-12-08 DIAGNOSIS — R296 Repeated falls: Secondary | ICD-10-CM

## 2019-12-08 DIAGNOSIS — F0280 Dementia in other diseases classified elsewhere without behavioral disturbance: Secondary | ICD-10-CM

## 2019-12-08 MED ORDER — MIRTAZAPINE 15 MG PO TABS: 15.0000 mg | ORAL_TABLET | Freq: Every day | ORAL | 3 refills | Status: DC

## 2019-12-08 MED ORDER — CARBIDOPA-LEVODOPA 25-100 MG PO TABS: ORAL_TABLET | ORAL | 5 refills | Status: DC

## 2019-12-08 MED ORDER — DONEPEZIL HCL 10 MG PO TABS: ORAL_TABLET | ORAL | 3 refills | Status: DC
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
## 2019-12-08 NOTE — Patient Instructions (Signed)
1. Start Sinemet 25/100mg : Take 1/2 tablet three times a day with meals, if no side effects after a week, increase to 1 tablet three times a day with meals  2. Continue Mirtazapine and Donepezil  3. Continue trying different foods, it is important to increase your food intake to avoid dehydration  4. Proceed with home PT  5. Follow-up in 3-4 months, call for any changes

## 2019-12-19 ENCOUNTER — Ambulatory Visit: Payer: Medicare Other | Admitting: Podiatry

## 2019-12-19 ENCOUNTER — Other Ambulatory Visit: Payer: Self-pay

## 2019-12-19 ENCOUNTER — Encounter: Payer: Self-pay | Admitting: Podiatry

## 2019-12-19 DIAGNOSIS — B351 Tinea unguium: Secondary | ICD-10-CM

## 2019-12-19 DIAGNOSIS — N183 Chronic kidney disease, stage 3 unspecified: Secondary | ICD-10-CM | POA: Diagnosis not present

## 2019-12-19 DIAGNOSIS — M79675 Pain in left toe(s): Secondary | ICD-10-CM | POA: Diagnosis not present

## 2019-12-19 DIAGNOSIS — M79674 Pain in right toe(s): Secondary | ICD-10-CM | POA: Diagnosis not present

## 2019-12-19 NOTE — Progress Notes (Signed)
This patient returns to my office for at risk foot care.  This patient requires this care by a professional since this patient will be at risk due to having chronic kidney disease.   This patient is unable to cut nails himself since the patient cannot reach his nails.These nails are painful walking and wearing shoes. He presents to the office with his wife . This patient presents for at risk foot care today.  General Appearance  Alert, conversant and in no acute stress.  Vascular  Dorsalis pedis and posterior tibial  pulses are weakly  palpable  bilaterally.  Capillary return is within normal limits  bilaterally. Temperature is within normal limits  bilaterally.  Neurologic  Senn-Weinstein monofilament wire test within normal limits  bilaterally. Muscle power within normal limits bilaterally.  Nails Thick disfigured discolored nails with subungual debris  from hallux to fifth toes bilaterally. No evidence of bacterial infection or drainage bilaterally.  Orthopedic  No limitations of motion  feet .  No crepitus or effusions noted.  No bony pathology or digital deformities noted.  HAV  B/L.  Skin  normotropic skin with no porokeratosis noted bilaterally.  No signs of infections or ulcers noted.     Onychomycosis  Pain in right toes  Pain in left toes  Consent was obtained for treatment procedures.   Mechanical debridement of nails 1-5  bilaterally performed with a nail nipper.  Filed with dremel without incident.    Return office visit    3 months                  Told patient to return for periodic foot care and evaluation due to potential at risk complications.   Gardiner Barefoot DPM

## 2019-12-20 ENCOUNTER — Other Ambulatory Visit: Payer: Medicare Other

## 2019-12-20 DIAGNOSIS — E559 Vitamin D deficiency, unspecified: Secondary | ICD-10-CM | POA: Diagnosis not present

## 2019-12-20 DIAGNOSIS — E440 Moderate protein-calorie malnutrition: Secondary | ICD-10-CM | POA: Diagnosis not present

## 2019-12-20 DIAGNOSIS — E782 Mixed hyperlipidemia: Secondary | ICD-10-CM | POA: Diagnosis not present

## 2019-12-20 DIAGNOSIS — E034 Atrophy of thyroid (acquired): Secondary | ICD-10-CM | POA: Diagnosis not present

## 2019-12-20 DIAGNOSIS — R7309 Other abnormal glucose: Secondary | ICD-10-CM | POA: Diagnosis not present

## 2019-12-20 NOTE — Addendum Note (Signed)
Addended by: [name] on: 12/20/2019 01:22 PM   Modules accepted: Orders

## 2019-12-21 LAB — CBC WITH DIFFERENTIAL/PLATELET
Absolute Monocytes: 504 cells/uL (ref 200–950)
Basophils Absolute: 28 cells/uL (ref 0–200)
Basophils Relative: 0.4 %
Eosinophils Absolute: 83 cells/uL (ref 15–500)
Eosinophils Relative: 1.2 %
HCT: 40 % (ref 38.5–50.0)
Hemoglobin: 12.7 g/dL — ABNORMAL LOW (ref 13.2–17.1)
Lymphs Abs: 1842 cells/uL (ref 850–3900)
MCH: 31.4 pg (ref 27.0–33.0)
MCHC: 31.8 g/dL — ABNORMAL LOW (ref 32.0–36.0)
MCV: 99 fL (ref 80.0–100.0)
MPV: 10.3 fL (ref 7.5–12.5)
Monocytes Relative: 7.3 %
Neutro Abs: 4444 cells/uL (ref 1500–7800)
Neutrophils Relative %: 64.4 %
Platelets: 283 10*3/uL (ref 140–400)
RBC: 4.04 10*6/uL — ABNORMAL LOW (ref 4.20–5.80)
RDW: 13.1 % (ref 11.0–15.0)
Total Lymphocyte: 26.7 %
WBC: 6.9 10*3/uL (ref 3.8–10.8)

## 2019-12-21 LAB — HEMOGLOBIN A1C
Hgb A1c MFr Bld: 4.8 % of total Hgb (ref <5.7)
Mean Plasma Glucose: 91 (calc)
eAG (mmol/L): 5 (calc)

## 2019-12-21 LAB — COMPLETE METABOLIC PANEL WITH GFR
AG Ratio: 1.3 (calc) (ref 1.0–2.5)
ALT: 9 U/L (ref 9–46)
AST: 18 U/L (ref 10–35)
Albumin: 3.5 g/dL — ABNORMAL LOW (ref 3.6–5.1)
Alkaline phosphatase (APISO): 128 U/L (ref 35–144)
BUN/Creatinine Ratio: 8 (calc) (ref 6–22)
BUN: 19 mg/dL (ref 7–25)
CO2: 24 mmol/L (ref 20–32)
Calcium: 9.1 mg/dL (ref 8.6–10.3)
Chloride: 102 mmol/L (ref 98–110)
Creat: 2.29 mg/dL — ABNORMAL HIGH (ref 0.70–1.18)
GFR, Est African American: 30 mL/min/{1.73_m2} — ABNORMAL LOW (ref >=60)
GFR, Est Non African American: 26 mL/min/{1.73_m2} — ABNORMAL LOW (ref >=60)
Globulin: 2.8 g/dL (calc) (ref 1.9–3.7)
Glucose, Bld: 85 mg/dL (ref 65–99)
Potassium: 4.7 mmol/L (ref 3.5–5.3)
Sodium: 140 mmol/L (ref 135–146)
Total Bilirubin: 0.7 mg/dL (ref 0.2–1.2)
Total Protein: 6.3 g/dL (ref 6.1–8.1)

## 2019-12-21 LAB — LIPID PANEL
Cholesterol: 201 mg/dL — ABNORMAL HIGH (ref <200)
HDL: 84 mg/dL (ref >=40)
LDL Cholesterol (Calc): 92 mg/dL (calc)
Non-HDL Cholesterol (Calc): 117 mg/dL (calc) (ref <130)
Total CHOL/HDL Ratio: 2.4 (calc) (ref <5.0)
Triglycerides: 145 mg/dL (ref <150)

## 2019-12-21 LAB — PSA: PSA: 6.2 ng/mL — ABNORMAL HIGH (ref <=4.0)

## 2019-12-21 LAB — T4, FREE: Free T4: 1.1 ng/dL (ref 0.8–1.8)

## 2019-12-21 LAB — TSH: TSH: 1.27 mIU/L (ref 0.40–4.50)

## 2019-12-21 LAB — VITAMIN D 25 HYDROXY (VIT D DEFICIENCY, FRACTURES): Vit D, 25-Hydroxy: 68 ng/mL (ref 30–100)

## 2019-12-27 ENCOUNTER — Ambulatory Visit (INDEPENDENT_AMBULATORY_CARE_PROVIDER_SITE_OTHER): Payer: Medicare Other | Admitting: Family Medicine

## 2019-12-27 ENCOUNTER — Encounter: Payer: Self-pay | Admitting: Family Medicine

## 2019-12-27 ENCOUNTER — Other Ambulatory Visit: Payer: Self-pay

## 2019-12-27 VITALS — BP 120/55 | HR 91 | Temp 97.3°F | Resp 16 | Ht 70.0 in | Wt 123.0 lb

## 2019-12-27 DIAGNOSIS — R2689 Other abnormalities of gait and mobility: Secondary | ICD-10-CM

## 2019-12-27 DIAGNOSIS — M6281 Muscle weakness (generalized): Secondary | ICD-10-CM | POA: Diagnosis not present

## 2019-12-27 DIAGNOSIS — N1831 Chronic kidney disease, stage 3a: Secondary | ICD-10-CM

## 2019-12-27 DIAGNOSIS — R296 Repeated falls: Secondary | ICD-10-CM

## 2019-12-27 DIAGNOSIS — F0280 Dementia in other diseases classified elsewhere without behavioral disturbance: Secondary | ICD-10-CM

## 2019-12-27 DIAGNOSIS — R7989 Other specified abnormal findings of blood chemistry: Secondary | ICD-10-CM

## 2019-12-27 DIAGNOSIS — Z8673 Personal history of transient ischemic attack (TIA), and cerebral infarction without residual deficits: Secondary | ICD-10-CM

## 2019-12-27 DIAGNOSIS — E440 Moderate protein-calorie malnutrition: Secondary | ICD-10-CM

## 2019-12-27 DIAGNOSIS — G3183 Dementia with Lewy bodies: Secondary | ICD-10-CM

## 2019-12-27 DIAGNOSIS — N179 Acute kidney failure, unspecified: Secondary | ICD-10-CM

## 2019-12-27 NOTE — Patient Instructions (Addendum)
Thank you for coming to the office today.  Will follow-up Kidney function on next lab Try best as possible to stay hydrated if can Goal to improve nutrition / protein and boost the albumin  Proceed with home health PT - we can offer referral to Palliative in future can help more with nutrition or other symptoms. Let me know.  Will go ahead and refer to [facility] Management Team - Noreene Larsson RN and Eula Fried LCSW social work for initial contact to help coordinate your care   DUE for FASTING BLOOD WORK (no food or drink after midnight before the lab appointment, only water or coffee without cream/sugar on the morning of)  SCHEDULE "Lab Only" visit in the morning at the clinic for lab draw in [place]   - Make sure Lab Only appointment is at about 1 week before your next appointment, so that results will be available  For Lab Results, once available within 2-3 days of blood draw, you can can log in to MyChart online to view your results and a brief explanation. Also, we can discuss results at next follow-up visit.     Please schedule a Follow-up Appointment to: Return in about 4 weeks (around 01/24/2020) for 4 weeks MyChart Video visit follow-up Weight / [place] PT / Lab results Kidney Fxn.  If you have any other questions or concerns, please feel free to call the office or send a message through [place]. You may also schedule an earlier appointment if necessary.  Additionally, you may be receiving a survey about your experience at our office within a few days to 1 week by e-mail or mail. We value your feedback.  [name], DO [place]

## 2019-12-27 NOTE — Progress Notes (Signed)
Subjective:    Patient ID: Blake Bates, male    DOB: [birth year], 79 y.o.   MRN: [id number]  Blake Bates is a 79 y.o. male presenting on 12/27/2019 for Annual Exam  History provided by patient and also wife, Lindia.  HPI   Here for Annuual Physical and Lab Review.  Generalized Weakness / Recurrent Falls Balance Disorder /Imbalance/ Lefthomonymoushemianopia Lewy Body Dementia Moderate Protein Calorie Malnutrition Elevated Creatinine / Acute on Chronic Kidney Disease Stage III  - Seeprior detailed HPI prior notes forLewy Body Dementia likely contributing.Followed by Dr [name] Neurology. Known prior history of CVA affecting as well.Lastaptwith neurology4/28/2021. He is managed on Aricept 10mg , and was added Mirtazapine 7.5mg  nightly for improve appetite and also for mood., hehas thought to have some parkinsonian features with his gait and stiffness with walking, and also some tremor - He had continued on neuro guided PT outpatient for balance and gait,  Last visit with me virtual video 10/2019 - Had delays due to staffing and other companies unable to meet their needs. - Ultimately he has not demonstrated any further improvement with outpatient ambulatory PT and there were concerns with traveling to ambulatory setting for higher risk patient, and PT has recommended transition to Home health PT / OT services - He will be setup soon for [facility] RN PT, they will initiate services with [facility] starting this Friday  He has difficulty leaving home due to cognitive/dementia and balance and high fall risk. Requires assistance.  He is needing increased assistance at home with ADLs and wife is helping with those but they have concerns about safety of in and out of tub / bath and steps at home.  They report today continued generalized weakness, problem with recurrent falls, and continued complicated balance and gait, with known Lewy Body Dementia and balance disorder  as managed by Neurology  Now recently started on Carbidopa-Levodopa per Neuro  Still very poor appetite with weight loss, eating cheerios and cookies often, low protein, limited intake of appropriate hydration / nutrition. Trying to do protein supplement. - reduced water intake, drinks mostly coffee - Last lab showed low albumin lab at 3.5 lower than prior result.  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 Wife helps with getting him dressed, he will try to work on PT to get into garage, difficulty getting in and out of chair and car, he is not self sufficient- likes to go for rides   Health Maintenance:  Elevated PSA range in past 3-4 years, 3 to 4 average, then up to 6 in 2019, down to 4, in 2020, then now back up to 6    Depression screen [facility] 2/9 12/27/2019 09/19/2019 06/21/2019  Decreased Interest 0 0 0  Down, Depressed, Hopeless 0 0 0  PHQ - 2 Score 0 0 0  Altered sleeping - - -  Tired, decreased energy - - -  Change in appetite - - -  Feeling bad or failure about yourself  - - -  Trouble concentrating - - -  Moving slowly or  fidgety/restless - - -  Suicidal thoughts - - -  PHQ-9 Score - - -  Difficult doing work/chores - - -    Past Medical History:  Diagnosis Date  . Asthma   . Diverticulosis   . Full dentures   . Hyperlipidemia   . Hypothyroidism    s/p subtotal thyroidectomy  . Muscle spasms of lower extremity    Right upper leg  . Osteoporosis   . Rhinitis, allergic    pollens, mold, animal dander     Past Surgical History:  Procedure Laterality Date  . CATARACT EXTRACTION W/PHACO Left 09/20/2014   Procedure: CATARACT EXTRACTION PHACO AND INTRAOCULAR LENS PLACEMENT (IOC);  Surgeon: Leandrew Koyanagi, MD;  Location: [facility];  Service: Ophthalmology;  Laterality: Left;  . COLONOSCOPY    . PARATHYROIDECTOMY    . THYROID SURGERY     subtotal   Social History   Socioeconomic History  . Marital status: Married    Spouse name: Not on file  . Number of children: Not on file  . Years of education: Not on file  . Highest education level: Doctorate  Occupational History  . Occupation: retired  Tobacco Use  . Smoking status: Former Smoker    Packs/day: 0.00    Years: 60.00    Pack years: 0.00    Types: Pipe    Quit date: 05/12/2016    Years since quitting: 3.6  . Smokeless tobacco: Former Systems developer  . Tobacco comment: Self taper down from full pipe daily down to half to quarter pipe  Vaping Use  . Vaping Use: Never used  Substance and Sexual Activity  . Alcohol use: Not Currently    Comment: occasionally beer or wine   . Drug use: No  . Sexual activity: Not on file  Other Topics Concern  . Not on file  Yell lives in 1 story home with his wife   Right handed   Has 2 surviving children   PhD in Biology   Retired professor   Social Determinants of Radio broadcast assistant Strain:   . Difficulty of Paying Living Expenses:   Food Insecurity:   . Worried About Charity fundraiser in the Last Year:   . Arboriculturist in the Last Year:   Transportation Needs:   . Film/video editor (Medical):   [name] Lack of Transportation (Non-Medical):   Physical Activity:   . Days of Exercise per Week:   . Minutes of Exercise per Session:   Stress:   . Feeling of Stress :   Social Connections:   . Frequency of Communication with Friends and Family:   . Frequency of Social Gatherings with Friends and Family:   .  Attends Religious Services:   . Active Member of Clubs or Organizations:   . Attends Archivist Meetings:   [name] Marital Status:   Intimate Partner Violence:   . Fear of Current or Ex-Partner:   . Emotionally Abused:   [name] Physically Abused:   . Sexually Abused:    Family History  Problem Relation Age of Onset  . Stroke Mother 26  . Lung cancer Father 43  . Prostate cancer Paternal Grandfather 71  . Stroke Paternal Aunt    Current Outpatient Medications on File Prior to Visit  Medication Sig  . Acetaminophen (TYLENOL ARTHRITIS PAIN PO) Take by mouth. Once a day  . albuterol (  PROVENTIL HFA;VENTOLIN HFA) 108 (90 BASE) MCG/ACT inhaler Inhale into the lungs every 6 (six) hours as needed for wheezing or shortness of breath.  [name] aspirin EC 81 MG tablet Take 1 tablet (81 mg total) by mouth daily.  . Cholecalciferol (VITAMIN D-3 PO) Take 5,000 Units/oz/day by mouth daily. PM   . Cyanocobalamin (VITAMIN B-12) 5000 MCG SUBL Place under the tongue daily.  [name] donepezil (ARICEPT) 10 MG tablet Take 1 tablet daily  . levothyroxine (SYNTHROID) 50 MCG tablet TAKE 1 TABLET(50 MCG) BY MOUTH DAILY BEFORE BREAKFAST  . loratadine (CLARITIN) 10 MG tablet Take 10 mg by mouth daily as needed for allergies. PM  . meloxicam (MOBIC) 15 MG tablet TAKE 1 TABLET BY MOUTH DAILY AS NEEDED FOR PAIN. FOR ARTHRITIS FLARE, TAKE UP TO 1-2 WEEKS AT A TIME.  . mirtazapine (REMERON) 15 MG tablet Take 1 tablet (15 mg total) by mouth at bedtime.  [name] neomycin-polymyxin-pramoxine (NEOSPORIN PLUS) 1 % cream Apply topically 2 (two) times daily.  . carbidopa-levodopa (SINEMET IR) 25-100 MG tablet Take 1/2 tablet three times a day with meals, if no side effects after a week, increase to 1 tablet three times a day with meals (Patient not taking: Reported on 12/27/2019)   No current facility-administered medications on file prior to visit.    Review of Systems  Constitutional: Positive for appetite change. Negative for activity  change, chills, diaphoresis, fatigue and fever.  HENT: Negative for congestion and hearing loss.   Eyes: Negative for visual disturbance.  Respiratory: Negative for apnea, cough, chest tightness, shortness of breath and wheezing.   Cardiovascular: Negative for chest pain, palpitations and leg swelling.  Gastrointestinal: Negative for abdominal pain, anal bleeding, blood in stool, constipation, diarrhea, nausea and vomiting.  Endocrine: Negative for cold intolerance.  Genitourinary: Negative for decreased urine volume, difficulty urinating, dysuria, frequency, hematuria, testicular pain and urgency.  Musculoskeletal: Negative for arthralgias, back pain and neck pain.  Skin: Negative for rash.  Allergic/Immunologic: Negative for environmental allergies.  Neurological: Negative for dizziness, weakness, light-headedness, numbness and headaches.  Hematological: Negative for adenopathy.  Psychiatric/Behavioral: Negative for behavioral problems, dysphoric mood and sleep disturbance. The patient is not nervous/anxious.    Per HPI unless specifically indicated above     Objective:    BP (!) 120/55   Pulse 91   Temp (!) 97.3 F (36.3 C) (Temporal)   Resp 16   Ht 5\' 10"  (1.778 m)   Wt 123 lb (55.8 kg) Comment: patient was holding bars Risk of fall  SpO2 100%   BMI 17.65 kg/m   Wt Readings from Last 3 Encounters:  12/27/19 123 lb (55.8 kg)  12/08/19 130 lb (59 kg)  10/31/19 135 lb (61.2 kg)    Physical Exam Vitals and nursing note reviewed.  Constitutional:      General: He is not in acute distress.    Appearance: He is well-developed. He is not diaphoretic.     Comments: Elderly thin 79 yr male, comfortable, cooperative  HENT:     Head: Normocephalic and atraumatic.  Eyes:     General:        Right eye: No discharge.        Left eye: No discharge.     Conjunctiva/sclera: Conjunctivae normal.     Pupils: Pupils are equal, round, and reactive to light.  Neck:     Thyroid: No  thyromegaly.  Cardiovascular:     Rate and Rhythm: Normal rate and regular rhythm.  Heart sounds: Normal heart sounds. No murmur heard.   Pulmonary:     Effort: Pulmonary effort is normal. No respiratory distress.     Breath sounds: Normal breath sounds. No wheezing or rales.  Abdominal:     General: Bowel sounds are normal. There is no distension.     Palpations: Abdomen is soft. There is no mass.     Tenderness: There is no abdominal tenderness.  Musculoskeletal:        General: No tenderness. Normal range of motion.     Cervical back: Normal range of motion and neck supple.     Right lower leg: No edema.     Left lower leg: No edema.     Comments: Upper / Lower Extremities: - Normal muscle tone, strength bilateral upper extremities 5/5, lower extremities 5/5  Thin appearing with muscle atrophy temples, upper extremities and lower extremities  Lymphadenopathy:     Cervical: No cervical adenopathy.  Skin:    General: Skin is warm and dry.     Findings: No erythema or rash.  Neurological:     General: No focal deficit present.     Mental Status: He is alert and oriented to person, place, and time. Mental status is at baseline.     Comments: Distal sensation intact to light touch all extremities  Psychiatric:        Behavior: Behavior normal.     Comments: Well groomed, good eye contact, normal speech but not participating in conversation as much, thoughts are appropriate but sometimes very limited responses      Results for orders placed or performed in visit on 12/20/19  Hemoglobin A1c  Result Value Ref Range   Hgb A1c MFr Bld 4.8 <5.7 % of total Hgb   Mean Plasma Glucose 91 (calc)   eAG (mmol/L) 5.0 (calc)  CBC with Differential/Platelet  Result Value Ref Range   WBC 6.9 3.8 - 10.8 Thousand/uL   RBC 4.04 (L) 4.20 - 5.80 Million/uL   Hemoglobin 12.7 (L) 13.2 - 17.1 g/dL   HCT 40.0 38 - 50 %   MCV 99.0 80.0 - 100.0 fL   MCH 31.4 27.0 - 33.0 pg   MCHC 31.8 (L) 32.0  - 36.0 g/dL   RDW 13.1 11.0 - 15.0 %   Platelets 283 140 - 400 Thousand/uL   MPV 10.3 7.5 - 12.5 fL   Neutro Abs 4,444 1,500 - 7,800 cells/uL   Lymphs Abs 1,842 850 - 3,900 cells/uL   Absolute Monocytes 504 200 - 950 cells/uL   Eosinophils Absolute 83 15 - 500 cells/uL   Basophils Absolute 28 0 - 200 cells/uL   Neutrophils Relative % 64.4 %   Total Lymphocyte 26.7 %   Monocytes Relative 7.3 %   Eosinophils Relative 1.2 %   Basophils Relative 0.4 %  COMPLETE METABOLIC PANEL WITH GFR  Result Value Ref Range   Glucose, Bld 85 65 - 99 mg/dL   BUN 19 7 - 25 mg/dL   Creat 2.29 (H) 0.70 - 1.18 mg/dL   GFR, Est Non African American 26 (L) > OR = 60 mL/min/1.50m2   GFR, Est African American 30 (L) > OR = 60 mL/min/1.30m2   BUN/Creatinine Ratio 8 6 - 22 (calc)   Sodium 140 135 - 146 mmol/L   Potassium 4.7 3.5 - 5.3 mmol/L   Chloride 102 98 - 110 mmol/L   CO2 24 20 - 32 mmol/L   Calcium 9.1 8.6 - 10.3 mg/dL   Total  Protein 6.3 6.1 - 8.1 g/dL   Albumin 3.5 (L) 3.6 - 5.1 g/dL   Globulin 2.8 1.9 - 3.7 g/dL (calc)   AG Ratio 1.3 1.0 - 2.5 (calc)   Total Bilirubin 0.7 0.2 - 1.2 mg/dL   Alkaline phosphatase (APISO) 128 35 - 144 U/L   AST 18 10 - 35 U/L   ALT 9 9 - 46 U/L  Lipid panel  Result Value Ref Range   Cholesterol 201 (H) <200 mg/dL   HDL 84 > OR = 40 mg/dL   Triglycerides 145 <150 mg/dL   LDL Cholesterol (Calc) 92 mg/dL (calc)   Total CHOL/HDL Ratio 2.4 <5.0 (calc)   Non-HDL Cholesterol (Calc) 117 <130 mg/dL (calc)  PSA  Result Value Ref Range   PSA 6.2 (H) < OR = 4.0 ng/mL  TSH  Result Value Ref Range   TSH 1.27 0.40 - 4.50 mIU/L  T4, free  Result Value Ref Range   Free T4 1.1 0.8 - 1.8 ng/dL  VITAMIN D 25 Hydroxy (Vit-D Deficiency, Fractures)  Result Value Ref Range   Vit D, 25-Hydroxy 68 30 - 100 ng/mL      Assessment & Plan:   Problem List Items Addressed This Visit    Recurrent falls   Moderate protein-calorie malnutrition (HCC)   Lewy body dementia without  behavioral disturbance (HCC)   History of CVA (cerebrovascular accident)   Generalized muscle weakness - Primary   Balance disorder    Other Visit Diagnoses    Elevated serum creatinine       Acute renal failure superimposed on stage 3a chronic kidney disease, unspecified acute renal failure type (HCC)         Gradual progressive decline in setting of dementia and physical complications with generalized weakness now and recurrent falls due to balance disorder assoc with neurological problems and prior CVA  Previously [facility] outpatient PT/OT , now no longer benefiting enough and PT has discontinued services due to unmet goals and they are recommending transition to home health PT/OT and home care.  Patient is high risk for falls and has multiple medical co morbidities that impact him leaving home safely without assistance.  Proceed with [place], OT, Home Aide - Start this Friday 8/20  He will benefit from safety evaluation at home and using assistance devices at home, may need new orders on DME home equipment.  He may benefit from continued PT/OT with goal of strengthening and balance.  Continues on treatment per Neurology Dr Delice Lesch for dementia and balance.  Poor nutrition still, limited protein intake, low albumin and weight loss down 7 lbs in 1 month  Future we discussed options of getting more community resources / caregiver support    Referral to CCM team with nurse case management and social work for complex patient with multiple chronic conditions primarily with lewy body dementia declining functional status, malnutrition, poor appetite, weight loss, weakness and balance working with neurology and will begin [place] including PT soon, wife is primary caregiver   Future palliative care, long discussion today, Lindia son had to go on palliative and hospice, she is familiar, will hold referral for now. But we can consider in future.  Acute on Chronic  Kidney Injury Elevated Creatinine over past 6 months on last lab. Improve water hydration Repeat lab in 4 weeks  Orders Placed This Encounter  Procedures  . Ambulatory referral to Chronic Care Management Services    Referral Priority:   Routine  Referral Type:   Consultation    Referral Reason:   Care Coordination    Number of Visits Requested:   1      No orders of the defined types were placed in this encounter.    Follow up plan: Return in about 4 weeks (around 01/24/2020) for 4 weeks MyChart Video visit follow-up Weight / [place] PT / Lab results Kidney Fxn.  Future labs CMET, CBC, PSA ordered for 01/24/20  [name], DO [facility] 12/27/2019, 2:32 PM

## 2019-12-28 ENCOUNTER — Telehealth: Payer: Self-pay | Admitting: *Deleted

## 2019-12-28 ENCOUNTER — Other Ambulatory Visit: Payer: Self-pay | Admitting: Family Medicine

## 2019-12-28 DIAGNOSIS — M8949 Other hypertrophic osteoarthropathy, multiple sites: Secondary | ICD-10-CM

## 2019-12-28 DIAGNOSIS — E440 Moderate protein-calorie malnutrition: Secondary | ICD-10-CM

## 2019-12-28 DIAGNOSIS — M159 Polyosteoarthritis, unspecified: Secondary | ICD-10-CM

## 2019-12-28 DIAGNOSIS — M25551 Pain in right hip: Secondary | ICD-10-CM

## 2019-12-28 DIAGNOSIS — R7989 Other specified abnormal findings of blood chemistry: Secondary | ICD-10-CM

## 2019-12-28 DIAGNOSIS — N1831 Chronic kidney disease, stage 3a: Secondary | ICD-10-CM

## 2019-12-28 DIAGNOSIS — R972 Elevated prostate specific antigen [PSA]: Secondary | ICD-10-CM

## 2019-12-28 DIAGNOSIS — G8929 Other chronic pain: Secondary | ICD-10-CM

## 2019-12-28 NOTE — Chronic Care Management (AMB) (Signed)
  Chronic Care Management   Note  12/28/2019 Name: JAMEIL WHITMOYER MRN: [id number] DOB: 27-Jul-1940  [name] is a 79 y.o. year old male who is a primary care patient of [name], DO. I reached out to [name] by phone today in response to a referral sent by Mr. Islam Eichinger Heinbaugh's PCP, [name], DO.  Mr. Black was given information about Chronic Care Management services today including:  1. CCM service includes personalized support from designated clinical staff supervised by his physician, including individualized plan of care and coordination with other care providers 2. 24/7 contact phone numbers for assistance for urgent and routine care needs. 3. Service will only be billed when office clinical staff spend 20 minutes or more in a month to coordinate care. 4. Only one practitioner may furnish and bill the service in a calendar month. 5. The patient may stop CCM services at any time (effective at the end of the month) by phone call to the office staff. 6. The patient will be responsible for cost sharing (co-pay) of up to 20% of the service fee (after annual deductible is met).  Patient spouse Stockton Nunley DPR on file verbally agreed to assistance and services provided by embedded care coordination/care management team today.  Follow up plan: Telephone appointment with care management team member scheduled for: RN CM 01/05/2020 and LCSW 01/12/2020.  [place], [place] [address] Direct Dial: [phone] [email or web address] Website: [email or web address]

## 2019-12-30 ENCOUNTER — Telehealth: Payer: Self-pay

## 2019-12-30 DIAGNOSIS — J310 Chronic rhinitis: Secondary | ICD-10-CM | POA: Diagnosis not present

## 2019-12-30 DIAGNOSIS — E785 Hyperlipidemia, unspecified: Secondary | ICD-10-CM | POA: Diagnosis not present

## 2019-12-30 DIAGNOSIS — Z8673 Personal history of transient ischemic attack (TIA), and cerebral infarction without residual deficits: Secondary | ICD-10-CM | POA: Diagnosis not present

## 2019-12-30 DIAGNOSIS — M170 Bilateral primary osteoarthritis of knee: Secondary | ICD-10-CM | POA: Diagnosis not present

## 2019-12-30 DIAGNOSIS — Z7982 Long term (current) use of aspirin: Secondary | ICD-10-CM | POA: Diagnosis not present

## 2019-12-30 DIAGNOSIS — R1312 Dysphagia, oropharyngeal phase: Secondary | ICD-10-CM | POA: Diagnosis not present

## 2019-12-30 DIAGNOSIS — Z9181 History of falling: Secondary | ICD-10-CM | POA: Diagnosis not present

## 2019-12-30 DIAGNOSIS — K5790 Diverticulosis of intestine, part unspecified, without perforation or abscess without bleeding: Secondary | ICD-10-CM | POA: Diagnosis not present

## 2019-12-30 DIAGNOSIS — E039 Hypothyroidism, unspecified: Secondary | ICD-10-CM | POA: Diagnosis not present

## 2019-12-30 DIAGNOSIS — M160 Bilateral primary osteoarthritis of hip: Secondary | ICD-10-CM | POA: Diagnosis not present

## 2019-12-30 DIAGNOSIS — E440 Moderate protein-calorie malnutrition: Secondary | ICD-10-CM | POA: Diagnosis not present

## 2019-12-30 DIAGNOSIS — M810 Age-related osteoporosis without current pathological fracture: Secondary | ICD-10-CM | POA: Diagnosis not present

## 2019-12-30 DIAGNOSIS — J45909 Unspecified asthma, uncomplicated: Secondary | ICD-10-CM | POA: Diagnosis not present

## 2019-12-30 DIAGNOSIS — Z79899 Other long term (current) drug therapy: Secondary | ICD-10-CM | POA: Diagnosis not present

## 2019-12-30 DIAGNOSIS — H9190 Unspecified hearing loss, unspecified ear: Secondary | ICD-10-CM | POA: Diagnosis not present

## 2019-12-30 DIAGNOSIS — Z87891 Personal history of nicotine dependence: Secondary | ICD-10-CM | POA: Diagnosis not present

## 2019-12-30 NOTE — Telephone Encounter (Signed)
Copied from [place] [phone]. Topic: Quick Communication - Home Health Verbal Orders >> Dec 30, 2019 12:55 PM Gillis Ends D wrote: Caller/Agency: [facility] Number: [phone] okay to leave voice message Requesting OT/PT/Skilled Nursing/Social Work/Speech Therapy: [facility] and requesting a speech therapy evaluation Frequency:twice a week for 3 weeks and once a week for 3

## 2020-01-02 DIAGNOSIS — E440 Moderate protein-calorie malnutrition: Secondary | ICD-10-CM | POA: Diagnosis not present

## 2020-01-02 DIAGNOSIS — M170 Bilateral primary osteoarthritis of knee: Secondary | ICD-10-CM | POA: Diagnosis not present

## 2020-01-02 DIAGNOSIS — K5790 Diverticulosis of intestine, part unspecified, without perforation or abscess without bleeding: Secondary | ICD-10-CM | POA: Diagnosis not present

## 2020-01-02 DIAGNOSIS — M160 Bilateral primary osteoarthritis of hip: Secondary | ICD-10-CM | POA: Diagnosis not present

## 2020-01-02 DIAGNOSIS — J45909 Unspecified asthma, uncomplicated: Secondary | ICD-10-CM | POA: Diagnosis not present

## 2020-01-02 DIAGNOSIS — H9190 Unspecified hearing loss, unspecified ear: Secondary | ICD-10-CM | POA: Diagnosis not present

## 2020-01-02 DIAGNOSIS — Z79899 Other long term (current) drug therapy: Secondary | ICD-10-CM | POA: Diagnosis not present

## 2020-01-02 DIAGNOSIS — Z8673 Personal history of transient ischemic attack (TIA), and cerebral infarction without residual deficits: Secondary | ICD-10-CM | POA: Diagnosis not present

## 2020-01-02 DIAGNOSIS — Z9181 History of falling: Secondary | ICD-10-CM | POA: Diagnosis not present

## 2020-01-02 DIAGNOSIS — Z7982 Long term (current) use of aspirin: Secondary | ICD-10-CM | POA: Diagnosis not present

## 2020-01-02 DIAGNOSIS — Z87891 Personal history of nicotine dependence: Secondary | ICD-10-CM | POA: Diagnosis not present

## 2020-01-02 DIAGNOSIS — R1312 Dysphagia, oropharyngeal phase: Secondary | ICD-10-CM | POA: Diagnosis not present

## 2020-01-02 DIAGNOSIS — E039 Hypothyroidism, unspecified: Secondary | ICD-10-CM | POA: Diagnosis not present

## 2020-01-02 DIAGNOSIS — J310 Chronic rhinitis: Secondary | ICD-10-CM | POA: Diagnosis not present

## 2020-01-02 DIAGNOSIS — E785 Hyperlipidemia, unspecified: Secondary | ICD-10-CM | POA: Diagnosis not present

## 2020-01-02 DIAGNOSIS — M810 Age-related osteoporosis without current pathological fracture: Secondary | ICD-10-CM | POA: Diagnosis not present

## 2020-01-02 NOTE — Telephone Encounter (Signed)
Agree.   Please provide them with the verbal orders they need.  Thanks!  [name], [facility] 01/02/2020, 8:38 AM

## 2020-01-02 NOTE — Telephone Encounter (Signed)
Left detail message to give verbal.

## 2020-01-03 DIAGNOSIS — J310 Chronic rhinitis: Secondary | ICD-10-CM | POA: Diagnosis not present

## 2020-01-03 DIAGNOSIS — R1312 Dysphagia, oropharyngeal phase: Secondary | ICD-10-CM | POA: Diagnosis not present

## 2020-01-03 DIAGNOSIS — E440 Moderate protein-calorie malnutrition: Secondary | ICD-10-CM | POA: Diagnosis not present

## 2020-01-03 DIAGNOSIS — E039 Hypothyroidism, unspecified: Secondary | ICD-10-CM | POA: Diagnosis not present

## 2020-01-03 DIAGNOSIS — Z79899 Other long term (current) drug therapy: Secondary | ICD-10-CM | POA: Diagnosis not present

## 2020-01-03 DIAGNOSIS — J45909 Unspecified asthma, uncomplicated: Secondary | ICD-10-CM | POA: Diagnosis not present

## 2020-01-03 DIAGNOSIS — E785 Hyperlipidemia, unspecified: Secondary | ICD-10-CM | POA: Diagnosis not present

## 2020-01-03 DIAGNOSIS — H9190 Unspecified hearing loss, unspecified ear: Secondary | ICD-10-CM | POA: Diagnosis not present

## 2020-01-03 DIAGNOSIS — M160 Bilateral primary osteoarthritis of hip: Secondary | ICD-10-CM | POA: Diagnosis not present

## 2020-01-03 DIAGNOSIS — M170 Bilateral primary osteoarthritis of knee: Secondary | ICD-10-CM | POA: Diagnosis not present

## 2020-01-03 DIAGNOSIS — Z7982 Long term (current) use of aspirin: Secondary | ICD-10-CM | POA: Diagnosis not present

## 2020-01-03 DIAGNOSIS — K5790 Diverticulosis of intestine, part unspecified, without perforation or abscess without bleeding: Secondary | ICD-10-CM | POA: Diagnosis not present

## 2020-01-03 DIAGNOSIS — M810 Age-related osteoporosis without current pathological fracture: Secondary | ICD-10-CM | POA: Diagnosis not present

## 2020-01-03 DIAGNOSIS — Z9181 History of falling: Secondary | ICD-10-CM | POA: Diagnosis not present

## 2020-01-03 DIAGNOSIS — Z8673 Personal history of transient ischemic attack (TIA), and cerebral infarction without residual deficits: Secondary | ICD-10-CM | POA: Diagnosis not present

## 2020-01-03 DIAGNOSIS — Z87891 Personal history of nicotine dependence: Secondary | ICD-10-CM | POA: Diagnosis not present

## 2020-01-05 ENCOUNTER — Telehealth: Payer: Medicare Other | Admitting: General Practice

## 2020-01-05 ENCOUNTER — Ambulatory Visit (INDEPENDENT_AMBULATORY_CARE_PROVIDER_SITE_OTHER): Payer: Medicare Other | Admitting: General Practice

## 2020-01-05 DIAGNOSIS — E782 Mixed hyperlipidemia: Secondary | ICD-10-CM

## 2020-01-05 DIAGNOSIS — E039 Hypothyroidism, unspecified: Secondary | ICD-10-CM | POA: Diagnosis not present

## 2020-01-05 DIAGNOSIS — E034 Atrophy of thyroid (acquired): Secondary | ICD-10-CM

## 2020-01-05 DIAGNOSIS — M170 Bilateral primary osteoarthritis of knee: Secondary | ICD-10-CM | POA: Diagnosis not present

## 2020-01-05 DIAGNOSIS — E440 Moderate protein-calorie malnutrition: Secondary | ICD-10-CM | POA: Diagnosis not present

## 2020-01-05 DIAGNOSIS — J45909 Unspecified asthma, uncomplicated: Secondary | ICD-10-CM

## 2020-01-05 DIAGNOSIS — M810 Age-related osteoporosis without current pathological fracture: Secondary | ICD-10-CM | POA: Diagnosis not present

## 2020-01-05 DIAGNOSIS — K5790 Diverticulosis of intestine, part unspecified, without perforation or abscess without bleeding: Secondary | ICD-10-CM | POA: Diagnosis not present

## 2020-01-05 DIAGNOSIS — Z7982 Long term (current) use of aspirin: Secondary | ICD-10-CM | POA: Diagnosis not present

## 2020-01-05 DIAGNOSIS — Z79899 Other long term (current) drug therapy: Secondary | ICD-10-CM | POA: Diagnosis not present

## 2020-01-05 DIAGNOSIS — F0280 Dementia in other diseases classified elsewhere without behavioral disturbance: Secondary | ICD-10-CM

## 2020-01-05 DIAGNOSIS — M160 Bilateral primary osteoarthritis of hip: Secondary | ICD-10-CM | POA: Diagnosis not present

## 2020-01-05 DIAGNOSIS — J310 Chronic rhinitis: Secondary | ICD-10-CM | POA: Diagnosis not present

## 2020-01-05 DIAGNOSIS — Z9181 History of falling: Secondary | ICD-10-CM | POA: Diagnosis not present

## 2020-01-05 DIAGNOSIS — E785 Hyperlipidemia, unspecified: Secondary | ICD-10-CM | POA: Diagnosis not present

## 2020-01-05 DIAGNOSIS — R296 Repeated falls: Secondary | ICD-10-CM

## 2020-01-05 DIAGNOSIS — G3183 Dementia with Lewy bodies: Secondary | ICD-10-CM | POA: Diagnosis not present

## 2020-01-05 DIAGNOSIS — N183 Chronic kidney disease, stage 3 unspecified: Secondary | ICD-10-CM | POA: Diagnosis not present

## 2020-01-05 DIAGNOSIS — R1312 Dysphagia, oropharyngeal phase: Secondary | ICD-10-CM | POA: Diagnosis not present

## 2020-01-05 DIAGNOSIS — H9190 Unspecified hearing loss, unspecified ear: Secondary | ICD-10-CM | POA: Diagnosis not present

## 2020-01-05 DIAGNOSIS — Z8673 Personal history of transient ischemic attack (TIA), and cerebral infarction without residual deficits: Secondary | ICD-10-CM | POA: Diagnosis not present

## 2020-01-05 DIAGNOSIS — Z87891 Personal history of nicotine dependence: Secondary | ICD-10-CM | POA: Diagnosis not present

## 2020-01-05 DIAGNOSIS — R2689 Other abnormalities of gait and mobility: Secondary | ICD-10-CM

## 2020-01-05 NOTE — Chronic Care Management (AMB) (Signed)
Chronic Care Management   Initial Visit Note  01/05/2020 Name: Blake Bates MRN: [id number] DOB: [birth year]  Referred by: [name], DO Reason for referral : Chronic Care Management (RNCM Initial Outreach: Chronic Disease Management and Care Coordination Needs)   Blake Bates is a 79 y.o. year old male who is a primary care patient of [name], DO. The CCM team was consulted for assistance with chronic disease management and care coordination needs related to HLD, CKD Stage hypothyroidism and Lewy Body Dementia, Pulmonary Disease and 3  Review of patient status, including review of consultants reports, relevant laboratory and other test results, and collaboration with appropriate care team members and the patient's provider was performed as part of comprehensive patient evaluation and provision of chronic care management services.    SDOH (Social Determinants of Health) assessments performed: Yes See Care Plan activities for detailed interventions related to SDOH  SDOH Interventions     Most Recent Value  SDOH Interventions  Physical Activity Interventions Other (Comments)  [Working with PT in the home]  Social Connections Interventions Other (Comment)  [the patient has a good support system, wife is primary caregiver]       Medications: Outpatient Encounter Medications as of 01/05/2020  Medication Sig   Acetaminophen (TYLENOL ARTHRITIS PAIN PO) Take by mouth. Once a day   albuterol (PROVENTIL HFA;VENTOLIN HFA) 108 (90 BASE) MCG/ACT inhaler Inhale into the lungs every 6 (six) hours as needed for wheezing or shortness of breath.   aspirin EC 81 MG tablet Take 1 tablet (81 mg total) by mouth daily.   carbidopa-levodopa (SINEMET IR) 25-100 MG tablet Take 1/2 tablet three times a day with meals, if no side effects after a week, increase to 1 tablet three times a day with meals (Patient not taking: Reported on 12/27/2019)   Cholecalciferol  (VITAMIN D-3 PO) Take 5,000 Units/oz/day by mouth daily. PM    Cyanocobalamin (VITAMIN B-12) 5000 MCG SUBL Place under the tongue daily.   donepezil (ARICEPT) 10 MG tablet Take 1 tablet daily   levothyroxine (SYNTHROID) 50 MCG tablet TAKE 1 TABLET(50 MCG) BY MOUTH DAILY BEFORE BREAKFAST   loratadine (CLARITIN) 10 MG tablet Take 10 mg by mouth daily as needed for allergies. PM   meloxicam (MOBIC) 15 MG tablet TAKE 1 TABLET BY MOUTH DAILY AS NEEDED FOR PAIN AND ARTHRITIS FLARE, TAKE UP TO 1 TO 2 WEEKS AT A TIME   mirtazapine (REMERON) 15 MG tablet Take 1 tablet (15 mg total) by mouth at bedtime.   neomycin-polymyxin-pramoxine (NEOSPORIN PLUS) 1 % cream Apply topically 2 (two) times daily.   No facility-administered encounter medications on file as of 01/05/2020.     Objective:  BP Readings from Last 3 Encounters:  12/27/19 (!) 120/55  12/08/19 (!) 145/78  10/31/19 120/60    Goals Addressed              This Visit's Progress     RNCM-Pt's wife:"I am having a hard time getting him to eat" (pt-stated)        CARE PLAN ENTRY (see longtitudinal plan of care for additional care plan information)  Current Barriers:   Chronic Disease Management support, education, and care coordination needs related to HLD, CKD Stage 3, Pulmonary Disease, hypothyroidism and Lewy Body Dementia   Clinical Goal(s) related to HLD, CKD Stage 3, Pulmonary Disease, hypothyroidism and Lewy Body Dementia:  Over the next 120 days, patient will:   Work with the care management team to address  educational, disease management, and care coordination needs   Begin or continue self health monitoring activities as directed today Measure and record blood pressure 2/3 times per week and encouraging meals to help with nutritional needs and decline in weight loss  Call provider office for new or worsened signs and symptoms Blood pressure findings outside established parameters, Chest pain, Shortness of breath, and  New or worsened symptom related to hypothyroidism, worsening dementia and other chronic diseases  Call care management team with questions or concerns  Verbalize basic understanding of patient centered plan of care established today   Interventions related to HLD, CKD Stage 3, Pulmonary Disease, hypothyroidism and Lewy Body Dementia:   Evaluation of current treatment plans and patient's adherence to plan as established by provider.  The patients wife is compliant with the current plan of care but states the patient has had a rapid decline in his sx/sx.  CCM support in addition to pcp recommendations in place. The patient has PT/OT coming in the home currently. The patient with increased safety concerns. Ideas and recommendations provided today. See falls care plan.  Assessed patient understanding of disease states.  The patient does not have the ability to comprehend what is taking place with his health and well being. His wife Blake Bates is his primary care giver and understands his condition. Is thankful for CCM support and working with pcp to help with the complex nature of the patients chronic conditions.   Assessed patient's education and care coordination needs.  Care guide referral placed for additional resources and help for the patient with advance dementia.   Provided disease specific education to patient.  Review of dietary intake. The patient does not have a good appetite and has had wt loss of 7 pounds in one month. The patient does not like Ensure products but did discuss with the patients wife a milkshake type recipe that has protein in it. The wife is willing to try this to see if it will help the patient. There has been episodes when the patient ate something different and thought he was doing well with it but then threw it back up. Cheerios and honey nut cheerios seems to be the only thing that the patient will eat. Will research to see if there are additional recommendations to help with  dietary nutrition and protein for the patient. Will send the recipe to the patients wife by secure text messaging.   Collaborated with appropriate clinical care team members regarding patient needs.  CCM team is involved in the care of the patient. The LCSW has outreach scheduled next week to call and work with the patient and patients wife. The wife is appreciative of any help and resources she can get.    Patient Self Care Activities related to HLD, CKD Stage 3, Pulmonary Disease, hypothyroidism and Lewy Body Dementia:   Patient is unable to independently self-manage chronic health conditions  Initial goal documentation       RNCM: Pt's wife:"He had a fall on Saturday" (pt-stated)        [place] (see longitudinal plan of care for additional care plan information)  Current Barriers:   Knowledge Deficits related to fall precautions in patient with Lewy Body Dementia and other Chronic Conditions  Decreased adherence to prescribed treatment for fall prevention  Care Coordination needs related to fall prevention support systems and resources in a patient with Lewy Body Dementia  Lacks caregiver support. Wife is primary caregiver. Patients condition is declining. Needs additional  assistance in the home.   Cognitive Deficits  Clinical Goal(s):   Over the next 120 days, patient will demonstrate improved adherence to prescribed treatment plan for decreasing falls as evidenced by patient reporting and review of EMR  Over the next 120 days, patient will verbalize using fall risk reduction strategies discussed  Over the next 120 days, patient will not experience additional falls  Over the next 120 days, patient will verbalize understanding of plan for having safety devices in place to help with safety in the patient with frequent falls and balance issues  Over the next 120 days, patient will work with pcp, RNCM, CCM team and in home PT/OT to address needs related to Lewy body  dementia with frequent falls and decline in condition  Over the next 120 days, patient will attend all scheduled medical appointments: next appointment with the pcp on 01-31-2020  Over the next 120 days, patient will work with care guides  (community agency) to get resources for support for dementia patients and safety devices  Interventions:   Provided written and verbal education re: Potential causes of falls and Fall prevention strategies  Reviewed medications and discussed potential side effects of medications such as dizziness and frequent urination  Assessed for s/s of orthostatic hypotension.  Falls are related to changes in condition and decline from Lewy Body Dementia.  The patient has not issues related to blood pressure at this time. The patients wife is a Marine scientist and is aware of orthostatic hypotension.   Assessed for falls since last encounter. Per the wife the patient fell on Saturday when she had went out briefly to get things they needed at [facility]. When she got back he had went to the kitchen to get cheerios and fell. She had to call her son in law to come and help get him up.  He did not know who his son in law was.   Assessed patients knowledge of fall risk prevention secondary to previously provided education.  Assessed working status of life alert bracelet and patient adherence. Advised the patients wife to call [facility] and ask about the [place] life alert system. It should be a free service to the patient if this was a part of their benefit package.  The wife will do this and see if they can get this service. She does have a blink camera that she uses when she is in the other room or out briefly to check in on the patient.   Provided patient information for fall alert systems.  Provided information for the [place] life alert system through [facility] and ask the patients wife to call and inquire about the system as a benefit through [facility].   Evaluation of current treatment plan  related to falls and patient's adherence to plan as established by provider.  Provided education to patient re: getting the book the 36 hour day. The patients wife has purchased this on the recommendation of the PT personnel and just received it this week.  Also evaluated and educated the wife on care giver stress and needing to take care of self as she is the primary caregiver of the patient. She does have someone who is now coming in (Started this week) who is there Monday through Thursday and this has been a big help  Discussed plans with patient for ongoing care management follow up and provided patient with direct contact information for care management team  Reviewed scheduled/upcoming provider appointments including: 01-31-2020- follow up with the pcp.  Care Guide referral for community resources to help with caregiver support for patients with dementia and dementia resources.   Patient Self Care Activities:   Utilize walker (assistive device) appropriately with all ambulation.  Per the wife the patient is using his walker more; however she sees a big decline in his coordination and balance. The patient also comes in the room with his shoes on the wrong feet. He does get up some now at night. She is going to call the neurologist to get an increase in his medication to help with rest.   De-clutter walkways  Change positions slowly  Wear secure fitting shoes at all times with ambulation  Utilize home lighting for dim lit areas  Have self and pet awareness at all times  Plan:  CCM RN CM will follow up in 02-16-2020 at 2:30 pm.  The patients wife has the [facility] contact information to call sooner if needed.   Initial goal documentation         Mr. Riga was given information about Chronic Care Management services today including:  1. CCM service includes personalized support from designated clinical staff supervised by his physician, including individualized plan of care and  coordination with other care providers 2. 24/7 contact phone numbers for assistance for urgent and routine care needs. 3. Service will only be billed when office clinical staff spend 20 minutes or more in a month to coordinate care. 4. Only one practitioner may furnish and bill the service in a calendar month. 5. The patient may stop CCM services at any time (effective at the end of the month) by phone call to the office staff. 6. The patient will be responsible for cost sharing (co-pay) of up to 20% of the service fee (after annual deductible is met).  Patient agreed to services and verbal consent obtained.   Plan:   Telephone follow up appointment with care management team member scheduled for: 02-16-2020 at 20 pm  Noreene Larsson RN, MSN, [employer] Mobile: [phone]

## 2020-01-05 NOTE — Patient Instructions (Signed)
Visit Information  Goals Addressed              This Visit's Progress   .  RNCM-Pt's wife:"I am having a hard time getting him to eat" (pt-stated)        CARE PLAN ENTRY (see longtitudinal plan of care for additional care plan information)  Current Barriers:  . Chronic Disease Management support, education, and care coordination needs related to HLD, CKD Stage 3, Pulmonary Disease, hypothyroidism and Lewy Body Dementia  . Clinical Goal(s) related to HLD, CKD Stage 3, Pulmonary Disease, hypothyroidism and Lewy Body Dementia:  Over the next 120 days, patient will:  . Work with the care management team to address educational, disease management, and care coordination needs  . Begin or continue self health monitoring activities as directed today Measure and record blood pressure 2/3 times per week and encouraging meals to help with nutritional needs and decline in weight loss . Call provider office for new or worsened signs and symptoms Blood pressure findings outside established parameters, Chest pain, Shortness of breath, and New or worsened symptom related to hypothyroidism, worsening dementia and other chronic diseases . Call care management team with questions or concerns . Verbalize basic understanding of patient centered plan of care established today  . Interventions related to HLD, CKD Stage 3, Pulmonary Disease, hypothyroidism and Lewy Body Dementia:  . Evaluation of current treatment plans and patient's adherence to plan as established by provider.  The patients wife is compliant with the current plan of care but states the patient has had a rapid decline in his sx/sx.  CCM support in addition to pcp recommendations in place. The patient has PT/OT coming in the home currently. The patient with increased safety concerns. Ideas and recommendations provided today. See falls care plan. . Assessed patient understanding of disease states.  The patient does not have the ability to  comprehend what is taking place with his health and well being. His wife [name] is his primary care giver and understands his condition. Is thankful for CCM support and working with pcp to help with the complex nature of the patients chronic conditions.  . Assessed patient's education and care coordination needs.  Care guide referral placed for additional resources and help for the patient with advance dementia.  . Provided disease specific education to patient.  Review of dietary intake. The patient does not have a good appetite and has had wt loss of 7 pounds in one month. The patient does not like Ensure products but did discuss with the patients wife a milkshake type recipe that has protein in it. The wife is willing to try this to see if it will help the patient. There has been episodes when the patient ate something different and thought he was doing well with it but then threw it back up. Cheerios and honey nut cheerios seems to be the only thing that the patient will eat. Will research to see if there are additional recommendations to help with dietary nutrition and protein for the patient. Will send the recipe to the patients wife by secure text messaging.  N[place] Dim[place] with appropriate clinical care team members regarding patient needs.  CCM team is involved in the care of the patient. The LCSW has outreach scheduled next week to call and work with the patient and patients wife. The wife is appreciative of any help and resources she can get.   . Patient Self Care Activities related to HLD, CKD Stage 3, Pulmonary Disease,  hypothyroidism and Lewy Body Dementia:  . Patient is unable to independently self-manage chronic health conditions  Initial goal documentation     .  RNCM: Pt's wife:"He had a fall on Saturday" (pt-stated)        CARE PLAN ENTRY (see longitudinal plan of care for additional care plan information)  Current Barriers:  [name] Knowledge Deficits related to fall precautions in  patient with Lewy Body Dementia and other Chronic Conditions . Decreased adherence to prescribed treatment for fall prevention . Care Coordination needs related to fall prevention support systems and resources in a patient with Lewy Body Dementia . Lacks caregiver support. Wife is primary caregiver. Patients condition is declining. Needs additional assistance in the home.  . Cognitive Deficits  Clinical Goal(s):  [name] Over the next 120 days, patient will demonstrate improved adherence to prescribed treatment plan for decreasing falls as evidenced by patient reporting and review of EMR . Over the next 120 days, patient will verbalize using fall risk reduction strategies discussed . Over the next 120 days, patient will not experience additional falls . Over the next 120 days, patient will verbalize understanding of plan for having safety devices in place to help with safety in the patient with frequent falls and balance issues . Over the next 120 days, patient will work with pcp, RNCM, CCM team and in home PT/OT to address needs related to Lewy body dementia with frequent falls and decline in condition . Over the next 120 days, patient will attend all scheduled medical appointments: next appointment with the pcp on 01-31-2020 . Over the next 120 days, patient will work with care guides  (community agency) to get resources for support for dementia patients and safety devices  Interventions:  . Provided written and verbal education re: Potential causes of falls and Fall prevention strategies . Reviewed medications and discussed potential side effects of medications such as dizziness and frequent urination . Assessed for s/s of orthostatic hypotension.  Falls are related to changes in condition and decline from Lewy Body Dementia.  The patient has not issues related to blood pressure at this time. The patients wife is a Marine scientist and is aware of orthostatic hypotension.  . Assessed for falls since last  encounter. Per the wife the patient fell on Saturday when she had went out briefly to get things they needed at [facility]. When she got back he had went to the kitchen to get cheerios and fell. She had to call her son in law to come and help get him up.  He did not know who his son in law was.  . Assessed patients knowledge of fall risk prevention secondary to previously provided education. . Assessed working status of life alert bracelet and patient adherence. Advised the patients wife to call [facility] and ask about the [place] life alert system. It should be a free service to the patient if this was a part of their benefit package.  The wife will do this and see if they can get this service. She does have a blink camera that she uses when she is in the other room or out briefly to check in on the patient.  . Provided patient information for fall alert systems.  Provided information for the [place] life alert system through [facility] and ask the patients wife to call and inquire about the system as a benefit through [facility]  . Evaluation of current treatment plan related to falls and patient's adherence to plan as established by provider. [name]  Provided education to patient re: getting the book the 36 hour day. The patients wife has purchased this on the recommendation of the PT personnel and just received it this week.  Also evaluated and educated the wife on care giver stress and needing to take care of self as she is the primary caregiver of the patient. She does have someone who is now coming in (Started this week) who is there Monday through Thursday and this has been a big help . Discussed plans with patient for ongoing care management follow up and provided patient with direct contact information for care management team . Reviewed scheduled/upcoming provider appointments including: 01-31-2020- follow up with the pcp.  . Care Guide referral for community resources to help with caregiver support for patients with  dementia and dementia resources.   Patient Self Care Activities:  . Utilize walker (assistive device) appropriately with all ambulation.  Per the wife the patient is using his walker more; however she sees a big decline in his coordination and balance. The patient also comes in the room with his shoes on the wrong feet. He does get up some now at night. She is going to call the neurologist to get an increase in his medication to help with rest.  . De-clutter walkways . Change positions slowly . Wear secure fitting shoes at all times with ambulation . Utilize home lighting for dim lit areas . Have self and pet awareness at all times  Plan: . CCM RN CM will follow up in 02-16-2020 at 2:30 pm.  The patients wife has the [facility] contact information to call sooner if needed.   Initial goal documentation        Mr. Custodio was given information about Chronic Care Management services today including:  1. CCM service includes personalized support from designated clinical staff supervised by his physician, including individualized plan of care and coordination with other care providers 2. 24/7 contact phone numbers for assistance for urgent and routine care needs. 3. Service will only be billed when office clinical staff spend 20 minutes or more in a month to coordinate care. 4. Only one practitioner may furnish and bill the service in a calendar month. 5. The patient may stop CCM services at any time (effective at the end of the month) by phone call to the office staff. 6. The patient will be responsible for cost sharing (co-pay) of up to 20% of the service fee (after annual deductible is met).  Patient agreed to services and verbal consent obtained.   Patient verbalizes understanding of instructions provided today.   Telephone follow up appointment with care management team member scheduled for: 02-16-2020 at 230 pm  Noreene Larsson RN, MSN, [place] Mobile: [phone]

## 2020-01-09 DIAGNOSIS — Z8673 Personal history of transient ischemic attack (TIA), and cerebral infarction without residual deficits: Secondary | ICD-10-CM | POA: Diagnosis not present

## 2020-01-09 DIAGNOSIS — E785 Hyperlipidemia, unspecified: Secondary | ICD-10-CM | POA: Diagnosis not present

## 2020-01-09 DIAGNOSIS — Z87891 Personal history of nicotine dependence: Secondary | ICD-10-CM | POA: Diagnosis not present

## 2020-01-09 DIAGNOSIS — K5790 Diverticulosis of intestine, part unspecified, without perforation or abscess without bleeding: Secondary | ICD-10-CM | POA: Diagnosis not present

## 2020-01-09 DIAGNOSIS — R1312 Dysphagia, oropharyngeal phase: Secondary | ICD-10-CM | POA: Diagnosis not present

## 2020-01-09 DIAGNOSIS — J45909 Unspecified asthma, uncomplicated: Secondary | ICD-10-CM | POA: Diagnosis not present

## 2020-01-09 DIAGNOSIS — E440 Moderate protein-calorie malnutrition: Secondary | ICD-10-CM | POA: Diagnosis not present

## 2020-01-09 DIAGNOSIS — Z9181 History of falling: Secondary | ICD-10-CM | POA: Diagnosis not present

## 2020-01-09 DIAGNOSIS — M170 Bilateral primary osteoarthritis of knee: Secondary | ICD-10-CM | POA: Diagnosis not present

## 2020-01-09 DIAGNOSIS — M160 Bilateral primary osteoarthritis of hip: Secondary | ICD-10-CM | POA: Diagnosis not present

## 2020-01-09 DIAGNOSIS — Z79899 Other long term (current) drug therapy: Secondary | ICD-10-CM | POA: Diagnosis not present

## 2020-01-09 DIAGNOSIS — J310 Chronic rhinitis: Secondary | ICD-10-CM | POA: Diagnosis not present

## 2020-01-09 DIAGNOSIS — Z7982 Long term (current) use of aspirin: Secondary | ICD-10-CM | POA: Diagnosis not present

## 2020-01-09 DIAGNOSIS — H9190 Unspecified hearing loss, unspecified ear: Secondary | ICD-10-CM | POA: Diagnosis not present

## 2020-01-09 DIAGNOSIS — M810 Age-related osteoporosis without current pathological fracture: Secondary | ICD-10-CM | POA: Diagnosis not present

## 2020-01-09 DIAGNOSIS — E039 Hypothyroidism, unspecified: Secondary | ICD-10-CM | POA: Diagnosis not present

## 2020-01-10 DIAGNOSIS — E785 Hyperlipidemia, unspecified: Secondary | ICD-10-CM | POA: Diagnosis not present

## 2020-01-10 DIAGNOSIS — M170 Bilateral primary osteoarthritis of knee: Secondary | ICD-10-CM | POA: Diagnosis not present

## 2020-01-10 DIAGNOSIS — Z9181 History of falling: Secondary | ICD-10-CM | POA: Diagnosis not present

## 2020-01-10 DIAGNOSIS — E039 Hypothyroidism, unspecified: Secondary | ICD-10-CM | POA: Diagnosis not present

## 2020-01-10 DIAGNOSIS — Z87891 Personal history of nicotine dependence: Secondary | ICD-10-CM | POA: Diagnosis not present

## 2020-01-10 DIAGNOSIS — Z7982 Long term (current) use of aspirin: Secondary | ICD-10-CM | POA: Diagnosis not present

## 2020-01-10 DIAGNOSIS — M810 Age-related osteoporosis without current pathological fracture: Secondary | ICD-10-CM | POA: Diagnosis not present

## 2020-01-10 DIAGNOSIS — K5790 Diverticulosis of intestine, part unspecified, without perforation or abscess without bleeding: Secondary | ICD-10-CM | POA: Diagnosis not present

## 2020-01-10 DIAGNOSIS — Z79899 Other long term (current) drug therapy: Secondary | ICD-10-CM | POA: Diagnosis not present

## 2020-01-10 DIAGNOSIS — R1312 Dysphagia, oropharyngeal phase: Secondary | ICD-10-CM | POA: Diagnosis not present

## 2020-01-10 DIAGNOSIS — H9190 Unspecified hearing loss, unspecified ear: Secondary | ICD-10-CM | POA: Diagnosis not present

## 2020-01-10 DIAGNOSIS — Z8673 Personal history of transient ischemic attack (TIA), and cerebral infarction without residual deficits: Secondary | ICD-10-CM | POA: Diagnosis not present

## 2020-01-10 DIAGNOSIS — M160 Bilateral primary osteoarthritis of hip: Secondary | ICD-10-CM | POA: Diagnosis not present

## 2020-01-10 DIAGNOSIS — J45909 Unspecified asthma, uncomplicated: Secondary | ICD-10-CM | POA: Diagnosis not present

## 2020-01-10 DIAGNOSIS — E440 Moderate protein-calorie malnutrition: Secondary | ICD-10-CM | POA: Diagnosis not present

## 2020-01-10 DIAGNOSIS — J310 Chronic rhinitis: Secondary | ICD-10-CM | POA: Diagnosis not present

## 2020-01-12 ENCOUNTER — Ambulatory Visit (INDEPENDENT_AMBULATORY_CARE_PROVIDER_SITE_OTHER): Payer: Medicare Other | Admitting: Licensed Clinical Social Worker

## 2020-01-12 DIAGNOSIS — E785 Hyperlipidemia, unspecified: Secondary | ICD-10-CM | POA: Diagnosis not present

## 2020-01-12 DIAGNOSIS — Z9181 History of falling: Secondary | ICD-10-CM | POA: Diagnosis not present

## 2020-01-12 DIAGNOSIS — Z8673 Personal history of transient ischemic attack (TIA), and cerebral infarction without residual deficits: Secondary | ICD-10-CM | POA: Diagnosis not present

## 2020-01-12 DIAGNOSIS — Z87891 Personal history of nicotine dependence: Secondary | ICD-10-CM | POA: Diagnosis not present

## 2020-01-12 DIAGNOSIS — E039 Hypothyroidism, unspecified: Secondary | ICD-10-CM | POA: Diagnosis not present

## 2020-01-12 DIAGNOSIS — J45909 Unspecified asthma, uncomplicated: Secondary | ICD-10-CM | POA: Diagnosis not present

## 2020-01-12 DIAGNOSIS — M810 Age-related osteoporosis without current pathological fracture: Secondary | ICD-10-CM | POA: Diagnosis not present

## 2020-01-12 DIAGNOSIS — N183 Chronic kidney disease, stage 3 unspecified: Secondary | ICD-10-CM

## 2020-01-12 DIAGNOSIS — J310 Chronic rhinitis: Secondary | ICD-10-CM | POA: Diagnosis not present

## 2020-01-12 DIAGNOSIS — R1312 Dysphagia, oropharyngeal phase: Secondary | ICD-10-CM | POA: Diagnosis not present

## 2020-01-12 DIAGNOSIS — E440 Moderate protein-calorie malnutrition: Secondary | ICD-10-CM | POA: Diagnosis not present

## 2020-01-12 DIAGNOSIS — H9190 Unspecified hearing loss, unspecified ear: Secondary | ICD-10-CM | POA: Diagnosis not present

## 2020-01-12 DIAGNOSIS — K5790 Diverticulosis of intestine, part unspecified, without perforation or abscess without bleeding: Secondary | ICD-10-CM | POA: Diagnosis not present

## 2020-01-12 DIAGNOSIS — Z79899 Other long term (current) drug therapy: Secondary | ICD-10-CM | POA: Diagnosis not present

## 2020-01-12 DIAGNOSIS — E034 Atrophy of thyroid (acquired): Secondary | ICD-10-CM

## 2020-01-12 DIAGNOSIS — M160 Bilateral primary osteoarthritis of hip: Secondary | ICD-10-CM | POA: Diagnosis not present

## 2020-01-12 DIAGNOSIS — Z7982 Long term (current) use of aspirin: Secondary | ICD-10-CM | POA: Diagnosis not present

## 2020-01-12 DIAGNOSIS — F0280 Dementia in other diseases classified elsewhere without behavioral disturbance: Secondary | ICD-10-CM

## 2020-01-12 DIAGNOSIS — M170 Bilateral primary osteoarthritis of knee: Secondary | ICD-10-CM | POA: Diagnosis not present

## 2020-01-12 DIAGNOSIS — R296 Repeated falls: Secondary | ICD-10-CM

## 2020-01-12 DIAGNOSIS — E782 Mixed hyperlipidemia: Secondary | ICD-10-CM

## 2020-01-12 NOTE — Chronic Care Management (AMB) (Signed)
Chronic Care Management    Clinical Social Work Follow Up Note  01/12/2020 Name: Blake Bates MRN: [id number] DOB: 20-Oct-1940  Blake Bates is a 79 y.o. year old male who is a primary care patient of [name], DO. The CCM team was consulted for assistance with Level of Care Concerns and Caregiver Stress.   Review of patient status, including review of consultants reports, other relevant assessments, and collaboration with appropriate care team members and the patient's provider was performed as part of comprehensive patient evaluation and provision of chronic care management services.    SDOH (Social Determinants of Health) assessments performed: Yes    Outpatient Encounter Medications as of 01/12/2020  Medication Sig  . Acetaminophen (TYLENOL ARTHRITIS PAIN PO) Take by mouth. Once a day  . albuterol (PROVENTIL HFA;VENTOLIN HFA) 108 (90 BASE) MCG/ACT inhaler Inhale into the lungs every 6 (six) hours as needed for wheezing or shortness of breath.  [name] aspirin EC 81 MG tablet Take 1 tablet (81 mg total) by mouth daily.  . carbidopa-levodopa (SINEMET IR) 25-100 MG tablet Take 1/2 tablet three times a day with meals, if no side effects after a week, increase to 1 tablet three times a day with meals (Patient not taking: Reported on 12/27/2019)  . Cholecalciferol (VITAMIN D-3 PO) Take 5,000 Units/oz/day by mouth daily. PM   . Cyanocobalamin (VITAMIN B-12) 5000 MCG SUBL Place under the tongue daily.  [name] donepezil (ARICEPT) 10 MG tablet Take 1 tablet daily  . levothyroxine (SYNTHROID) 50 MCG tablet TAKE 1 TABLET(50 MCG) BY MOUTH DAILY BEFORE BREAKFAST  . loratadine (CLARITIN) 10 MG tablet Take 10 mg by mouth daily as needed for allergies. PM  . meloxicam (MOBIC) 15 MG tablet TAKE 1 TABLET BY MOUTH DAILY AS NEEDED FOR PAIN AND ARTHRITIS FLARE, TAKE UP TO 1 TO 2 WEEKS AT A TIME  . mirtazapine (REMERON) 15 MG tablet Take 1 tablet (15 mg total) by mouth at bedtime.  [name]  neomycin-polymyxin-pramoxine (NEOSPORIN PLUS) 1 % cream Apply topically 2 (two) times daily.   No facility-administered encounter medications on file as of 01/12/2020.     Goals Addressed    .  SW- "We need more in home support for [name]." (pt-stated)        CARE PLAN ENTRY (see longitudinal plan of care for additional care plan information)  Current Barriers:  . Financial constraints related to affording a private caregiver . Limited social support . Level of care concerns . ADL IADL limitations . Social Isolation . Limited access to caregiver . Inability to perform ADL's independently . Inability to perform IADL's independently . Lacks knowledge of community resource: available support groups and senior resources within their area  Clinical Social Work Clinical Goal(s):  [name] Over the next 120 days, patient/caregiver will work with SW, RNCM and C3 Guide to address concerns related to care coordination needs and lack of education/support/resource connection. LCSW will assist patient in gaining community resource education and additional support and resource connection as well in order to maintain health and mental health appropriately  . Over the next 120 days, patient will demonstrate improved adherence to self care as evidenced by implementing healthy self-care into her daily routine such as: attending all medical appointments, deep breathing exercises, taking time for self-reflection, taking medications as prescribed, participate in PT/OT, drinking water and daily exercise to improve mobility and mood.  . Over the next 120 days, patient will demonstrate improved health management independence as evidenced by implementing healthy  self-care skills and positive support/resources into her daily routine to help cope with stressors and improve overall health and well-being  . Over the next 120 days, patient or caregiver will verbalize basic understanding of depression/stress process and self  health management plan as evidenced by her participation in development of long term plan of care and institution of self health management strategies  Interventions: . Inter-disciplinary care team collaboration (see longitudinal plan of care) . Patient interviewed and appropriate assessments performed . Referred patient to community resources care guide team for assistance with in home support. CCM RNCM has already placed C3 referral. LCSW will not make duplicate referral but will send update to C3 team.  . Provided mental health counseling with regard to caregiver burnout prevention. Caregiver was encouraged to start attending support groups within her area to gain emotional support and additional personal care resource education connections.  . [facility]- [facility], OT and Aide services has now stated. Aide is only able to complete 4 visits. Patient has already received 1 visit and a sponge bath was completed which provided spouse with care giver relief.  . Provided patient's caregiver with information about C.H.O.R.E program, [facility], Medicaid eligibility and Day Programs. Family reports that they would not qualify for Medicaid. They are interested in gaining in home support but are unable to pay for these services privately. Family is interested in C.H.O.R.E program. LCSW will update C3 team.  . Discussed plans with patient for ongoing care management follow up and provided patient with direct contact information for care management team . Advised patient's caregiver to check email for dementia support groups, senior center information and [place] that was emailed securely to spouse on 01/12/20.  . Assisted patient/caregiver with obtaining information about health plan benefits . Provided education and assistance to client regarding Advanced Directives. . Provided education to patient/caregiver regarding level of care options. . Provided education to  patient/caregiver about Hospice and/or Palliative Care services  Patient Self Care Activities:  . Attends all scheduled provider appointments . Calls provider office for new concerns or questions . Lacks social connections . Unable to perform ADLs independently . Unable to perform IADLs independently  Initial goal documentation      Follow Up Plan: Veteran will reach out to patient for assistance with in home support.  Eula Fried, BSW, MSW, [email or web address] Phone: [phone]

## 2020-01-17 DIAGNOSIS — Z7982 Long term (current) use of aspirin: Secondary | ICD-10-CM | POA: Diagnosis not present

## 2020-01-17 DIAGNOSIS — M160 Bilateral primary osteoarthritis of hip: Secondary | ICD-10-CM | POA: Diagnosis not present

## 2020-01-17 DIAGNOSIS — E785 Hyperlipidemia, unspecified: Secondary | ICD-10-CM | POA: Diagnosis not present

## 2020-01-17 DIAGNOSIS — Z87891 Personal history of nicotine dependence: Secondary | ICD-10-CM | POA: Diagnosis not present

## 2020-01-17 DIAGNOSIS — R1312 Dysphagia, oropharyngeal phase: Secondary | ICD-10-CM | POA: Diagnosis not present

## 2020-01-17 DIAGNOSIS — J310 Chronic rhinitis: Secondary | ICD-10-CM | POA: Diagnosis not present

## 2020-01-17 DIAGNOSIS — M170 Bilateral primary osteoarthritis of knee: Secondary | ICD-10-CM | POA: Diagnosis not present

## 2020-01-17 DIAGNOSIS — Z9181 History of falling: Secondary | ICD-10-CM | POA: Diagnosis not present

## 2020-01-17 DIAGNOSIS — E039 Hypothyroidism, unspecified: Secondary | ICD-10-CM | POA: Diagnosis not present

## 2020-01-17 DIAGNOSIS — E440 Moderate protein-calorie malnutrition: Secondary | ICD-10-CM | POA: Diagnosis not present

## 2020-01-17 DIAGNOSIS — Z8673 Personal history of transient ischemic attack (TIA), and cerebral infarction without residual deficits: Secondary | ICD-10-CM | POA: Diagnosis not present

## 2020-01-17 DIAGNOSIS — Z79899 Other long term (current) drug therapy: Secondary | ICD-10-CM | POA: Diagnosis not present

## 2020-01-17 DIAGNOSIS — H9190 Unspecified hearing loss, unspecified ear: Secondary | ICD-10-CM | POA: Diagnosis not present

## 2020-01-17 DIAGNOSIS — J45909 Unspecified asthma, uncomplicated: Secondary | ICD-10-CM | POA: Diagnosis not present

## 2020-01-17 DIAGNOSIS — M810 Age-related osteoporosis without current pathological fracture: Secondary | ICD-10-CM | POA: Diagnosis not present

## 2020-01-17 DIAGNOSIS — K5790 Diverticulosis of intestine, part unspecified, without perforation or abscess without bleeding: Secondary | ICD-10-CM | POA: Diagnosis not present

## 2020-01-18 DIAGNOSIS — Z7982 Long term (current) use of aspirin: Secondary | ICD-10-CM | POA: Diagnosis not present

## 2020-01-18 DIAGNOSIS — K5790 Diverticulosis of intestine, part unspecified, without perforation or abscess without bleeding: Secondary | ICD-10-CM | POA: Diagnosis not present

## 2020-01-18 DIAGNOSIS — R1312 Dysphagia, oropharyngeal phase: Secondary | ICD-10-CM | POA: Diagnosis not present

## 2020-01-18 DIAGNOSIS — J310 Chronic rhinitis: Secondary | ICD-10-CM | POA: Diagnosis not present

## 2020-01-18 DIAGNOSIS — M160 Bilateral primary osteoarthritis of hip: Secondary | ICD-10-CM | POA: Diagnosis not present

## 2020-01-18 DIAGNOSIS — E440 Moderate protein-calorie malnutrition: Secondary | ICD-10-CM | POA: Diagnosis not present

## 2020-01-18 DIAGNOSIS — E039 Hypothyroidism, unspecified: Secondary | ICD-10-CM | POA: Diagnosis not present

## 2020-01-18 DIAGNOSIS — M810 Age-related osteoporosis without current pathological fracture: Secondary | ICD-10-CM | POA: Diagnosis not present

## 2020-01-18 DIAGNOSIS — Z9181 History of falling: Secondary | ICD-10-CM | POA: Diagnosis not present

## 2020-01-18 DIAGNOSIS — Z8673 Personal history of transient ischemic attack (TIA), and cerebral infarction without residual deficits: Secondary | ICD-10-CM | POA: Diagnosis not present

## 2020-01-18 DIAGNOSIS — M170 Bilateral primary osteoarthritis of knee: Secondary | ICD-10-CM | POA: Diagnosis not present

## 2020-01-18 DIAGNOSIS — E785 Hyperlipidemia, unspecified: Secondary | ICD-10-CM | POA: Diagnosis not present

## 2020-01-18 DIAGNOSIS — Z87891 Personal history of nicotine dependence: Secondary | ICD-10-CM | POA: Diagnosis not present

## 2020-01-18 DIAGNOSIS — H9190 Unspecified hearing loss, unspecified ear: Secondary | ICD-10-CM | POA: Diagnosis not present

## 2020-01-18 DIAGNOSIS — Z79899 Other long term (current) drug therapy: Secondary | ICD-10-CM | POA: Diagnosis not present

## 2020-01-18 DIAGNOSIS — J45909 Unspecified asthma, uncomplicated: Secondary | ICD-10-CM | POA: Diagnosis not present

## 2020-01-19 DIAGNOSIS — K5790 Diverticulosis of intestine, part unspecified, without perforation or abscess without bleeding: Secondary | ICD-10-CM | POA: Diagnosis not present

## 2020-01-19 DIAGNOSIS — Z87891 Personal history of nicotine dependence: Secondary | ICD-10-CM | POA: Diagnosis not present

## 2020-01-19 DIAGNOSIS — J310 Chronic rhinitis: Secondary | ICD-10-CM | POA: Diagnosis not present

## 2020-01-19 DIAGNOSIS — M810 Age-related osteoporosis without current pathological fracture: Secondary | ICD-10-CM | POA: Diagnosis not present

## 2020-01-19 DIAGNOSIS — E039 Hypothyroidism, unspecified: Secondary | ICD-10-CM | POA: Diagnosis not present

## 2020-01-19 DIAGNOSIS — H9190 Unspecified hearing loss, unspecified ear: Secondary | ICD-10-CM | POA: Diagnosis not present

## 2020-01-19 DIAGNOSIS — Z8673 Personal history of transient ischemic attack (TIA), and cerebral infarction without residual deficits: Secondary | ICD-10-CM | POA: Diagnosis not present

## 2020-01-19 DIAGNOSIS — Z9181 History of falling: Secondary | ICD-10-CM | POA: Diagnosis not present

## 2020-01-19 DIAGNOSIS — R1312 Dysphagia, oropharyngeal phase: Secondary | ICD-10-CM | POA: Diagnosis not present

## 2020-01-19 DIAGNOSIS — Z79899 Other long term (current) drug therapy: Secondary | ICD-10-CM | POA: Diagnosis not present

## 2020-01-19 DIAGNOSIS — J45909 Unspecified asthma, uncomplicated: Secondary | ICD-10-CM | POA: Diagnosis not present

## 2020-01-19 DIAGNOSIS — E785 Hyperlipidemia, unspecified: Secondary | ICD-10-CM | POA: Diagnosis not present

## 2020-01-19 DIAGNOSIS — Z7982 Long term (current) use of aspirin: Secondary | ICD-10-CM | POA: Diagnosis not present

## 2020-01-19 DIAGNOSIS — M170 Bilateral primary osteoarthritis of knee: Secondary | ICD-10-CM | POA: Diagnosis not present

## 2020-01-19 DIAGNOSIS — E440 Moderate protein-calorie malnutrition: Secondary | ICD-10-CM | POA: Diagnosis not present

## 2020-01-19 DIAGNOSIS — M160 Bilateral primary osteoarthritis of hip: Secondary | ICD-10-CM | POA: Diagnosis not present

## 2020-01-23 ENCOUNTER — Other Ambulatory Visit: Payer: Self-pay | Admitting: *Deleted

## 2020-01-23 DIAGNOSIS — R972 Elevated prostate specific antigen [PSA]: Secondary | ICD-10-CM

## 2020-01-23 DIAGNOSIS — N1831 Chronic kidney disease, stage 3a: Secondary | ICD-10-CM

## 2020-01-23 DIAGNOSIS — E440 Moderate protein-calorie malnutrition: Secondary | ICD-10-CM

## 2020-01-23 DIAGNOSIS — R7989 Other specified abnormal findings of blood chemistry: Secondary | ICD-10-CM

## 2020-01-24 ENCOUNTER — Other Ambulatory Visit: Payer: Medicare Other

## 2020-01-24 DIAGNOSIS — N1831 Chronic kidney disease, stage 3a: Secondary | ICD-10-CM | POA: Diagnosis not present

## 2020-01-24 DIAGNOSIS — R1312 Dysphagia, oropharyngeal phase: Secondary | ICD-10-CM | POA: Diagnosis not present

## 2020-01-24 DIAGNOSIS — Z8673 Personal history of transient ischemic attack (TIA), and cerebral infarction without residual deficits: Secondary | ICD-10-CM | POA: Diagnosis not present

## 2020-01-24 DIAGNOSIS — J45909 Unspecified asthma, uncomplicated: Secondary | ICD-10-CM | POA: Diagnosis not present

## 2020-01-24 DIAGNOSIS — E785 Hyperlipidemia, unspecified: Secondary | ICD-10-CM | POA: Diagnosis not present

## 2020-01-24 DIAGNOSIS — Z9181 History of falling: Secondary | ICD-10-CM | POA: Diagnosis not present

## 2020-01-24 DIAGNOSIS — M810 Age-related osteoporosis without current pathological fracture: Secondary | ICD-10-CM | POA: Diagnosis not present

## 2020-01-24 DIAGNOSIS — H9190 Unspecified hearing loss, unspecified ear: Secondary | ICD-10-CM | POA: Diagnosis not present

## 2020-01-24 DIAGNOSIS — M170 Bilateral primary osteoarthritis of knee: Secondary | ICD-10-CM | POA: Diagnosis not present

## 2020-01-24 DIAGNOSIS — N179 Acute kidney failure, unspecified: Secondary | ICD-10-CM | POA: Diagnosis not present

## 2020-01-24 DIAGNOSIS — Z87891 Personal history of nicotine dependence: Secondary | ICD-10-CM | POA: Diagnosis not present

## 2020-01-24 DIAGNOSIS — E039 Hypothyroidism, unspecified: Secondary | ICD-10-CM | POA: Diagnosis not present

## 2020-01-24 DIAGNOSIS — E440 Moderate protein-calorie malnutrition: Secondary | ICD-10-CM | POA: Diagnosis not present

## 2020-01-24 DIAGNOSIS — Z79899 Other long term (current) drug therapy: Secondary | ICD-10-CM | POA: Diagnosis not present

## 2020-01-24 DIAGNOSIS — K5790 Diverticulosis of intestine, part unspecified, without perforation or abscess without bleeding: Secondary | ICD-10-CM | POA: Diagnosis not present

## 2020-01-24 DIAGNOSIS — R7989 Other specified abnormal findings of blood chemistry: Secondary | ICD-10-CM | POA: Diagnosis not present

## 2020-01-24 DIAGNOSIS — M160 Bilateral primary osteoarthritis of hip: Secondary | ICD-10-CM | POA: Diagnosis not present

## 2020-01-24 DIAGNOSIS — Z7982 Long term (current) use of aspirin: Secondary | ICD-10-CM | POA: Diagnosis not present

## 2020-01-24 DIAGNOSIS — J310 Chronic rhinitis: Secondary | ICD-10-CM | POA: Diagnosis not present

## 2020-01-24 LAB — CBC WITH DIFFERENTIAL/PLATELET
Absolute Monocytes: 670 cells/uL (ref 200–950)
Basophils Absolute: 26 cells/uL (ref 0–200)
Basophils Relative: 0.3 %
Eosinophils Absolute: 218 cells/uL (ref 15–500)
Eosinophils Relative: 2.5 %
HCT: 36.1 % — ABNORMAL LOW (ref 38.5–50.0)
Hemoglobin: 11.6 g/dL — ABNORMAL LOW (ref 13.2–17.1)
Lymphs Abs: 1592 cells/uL (ref 850–3900)
MCH: 31.8 pg (ref 27.0–33.0)
MCHC: 32.1 g/dL (ref 32.0–36.0)
MCV: 98.9 fL (ref 80.0–100.0)
MPV: 10.4 fL (ref 7.5–12.5)
Monocytes Relative: 7.7 %
Neutro Abs: 6194 cells/uL (ref 1500–7800)
Neutrophils Relative %: 71.2 %
Platelets: 343 10*3/uL (ref 140–400)
RBC: 3.65 10*6/uL — ABNORMAL LOW (ref 4.20–5.80)
RDW: 12.8 % (ref 11.0–15.0)
Total Lymphocyte: 18.3 %
WBC: 8.7 10*3/uL (ref 3.8–10.8)

## 2020-01-24 LAB — COMPLETE METABOLIC PANEL WITH GFR
AG Ratio: 1.2 (calc) (ref 1.0–2.5)
ALT: 13 U/L (ref 9–46)
AST: 21 U/L (ref 10–35)
Albumin: 3.2 g/dL — ABNORMAL LOW (ref 3.6–5.1)
Alkaline phosphatase (APISO): 158 U/L — ABNORMAL HIGH (ref 35–144)
BUN/Creatinine Ratio: 12 (calc) (ref 6–22)
BUN: 22 mg/dL (ref 7–25)
CO2: 26 mmol/L (ref 20–32)
Calcium: 8.7 mg/dL (ref 8.6–10.3)
Chloride: 103 mmol/L (ref 98–110)
Creat: 1.81 mg/dL — ABNORMAL HIGH (ref 0.70–1.18)
GFR, Est African American: 40 mL/min/{1.73_m2} — ABNORMAL LOW (ref >=60)
GFR, Est Non African American: 35 mL/min/{1.73_m2} — ABNORMAL LOW (ref >=60)
Globulin: 2.6 g/dL (calc) (ref 1.9–3.7)
Glucose, Bld: 86 mg/dL (ref 65–99)
Potassium: 4.9 mmol/L (ref 3.5–5.3)
Sodium: 139 mmol/L (ref 135–146)
Total Bilirubin: 0.7 mg/dL (ref 0.2–1.2)
Total Protein: 5.8 g/dL — ABNORMAL LOW (ref 6.1–8.1)

## 2020-01-24 LAB — PSA: PSA: 4.53 ng/mL — ABNORMAL HIGH (ref <=4.0)

## 2020-01-25 DIAGNOSIS — Z87891 Personal history of nicotine dependence: Secondary | ICD-10-CM | POA: Diagnosis not present

## 2020-01-25 DIAGNOSIS — J310 Chronic rhinitis: Secondary | ICD-10-CM | POA: Diagnosis not present

## 2020-01-25 DIAGNOSIS — M170 Bilateral primary osteoarthritis of knee: Secondary | ICD-10-CM | POA: Diagnosis not present

## 2020-01-25 DIAGNOSIS — E039 Hypothyroidism, unspecified: Secondary | ICD-10-CM | POA: Diagnosis not present

## 2020-01-25 DIAGNOSIS — E785 Hyperlipidemia, unspecified: Secondary | ICD-10-CM | POA: Diagnosis not present

## 2020-01-25 DIAGNOSIS — K5790 Diverticulosis of intestine, part unspecified, without perforation or abscess without bleeding: Secondary | ICD-10-CM | POA: Diagnosis not present

## 2020-01-25 DIAGNOSIS — Z9181 History of falling: Secondary | ICD-10-CM | POA: Diagnosis not present

## 2020-01-25 DIAGNOSIS — H9190 Unspecified hearing loss, unspecified ear: Secondary | ICD-10-CM | POA: Diagnosis not present

## 2020-01-25 DIAGNOSIS — M810 Age-related osteoporosis without current pathological fracture: Secondary | ICD-10-CM | POA: Diagnosis not present

## 2020-01-25 DIAGNOSIS — Z8673 Personal history of transient ischemic attack (TIA), and cerebral infarction without residual deficits: Secondary | ICD-10-CM | POA: Diagnosis not present

## 2020-01-25 DIAGNOSIS — Z79899 Other long term (current) drug therapy: Secondary | ICD-10-CM | POA: Diagnosis not present

## 2020-01-25 DIAGNOSIS — J45909 Unspecified asthma, uncomplicated: Secondary | ICD-10-CM | POA: Diagnosis not present

## 2020-01-25 DIAGNOSIS — R1312 Dysphagia, oropharyngeal phase: Secondary | ICD-10-CM | POA: Diagnosis not present

## 2020-01-25 DIAGNOSIS — M160 Bilateral primary osteoarthritis of hip: Secondary | ICD-10-CM | POA: Diagnosis not present

## 2020-01-25 DIAGNOSIS — E440 Moderate protein-calorie malnutrition: Secondary | ICD-10-CM | POA: Diagnosis not present

## 2020-01-25 DIAGNOSIS — Z7982 Long term (current) use of aspirin: Secondary | ICD-10-CM | POA: Diagnosis not present

## 2020-01-26 ENCOUNTER — Ambulatory Visit: Payer: Self-pay | Admitting: Licensed Clinical Social Worker

## 2020-01-26 DIAGNOSIS — N1831 Chronic kidney disease, stage 3a: Secondary | ICD-10-CM

## 2020-01-26 DIAGNOSIS — R296 Repeated falls: Secondary | ICD-10-CM

## 2020-01-26 DIAGNOSIS — N179 Acute kidney failure, unspecified: Secondary | ICD-10-CM

## 2020-01-26 DIAGNOSIS — E782 Mixed hyperlipidemia: Secondary | ICD-10-CM

## 2020-01-26 DIAGNOSIS — F0280 Dementia in other diseases classified elsewhere without behavioral disturbance: Secondary | ICD-10-CM

## 2020-01-26 NOTE — Chronic Care Management (AMB) (Signed)
Chronic Care Management    Clinical Social Work Follow Up Note  01/26/2020 Name: MACHAEL RAINE MRN: [id number] DOB: [birth year]  Blake Bates is a 79 y.o. year old male who is a primary care patient of [name], DO. The CCM team was consulted for assistance with Level of Care Concerns.   Review of patient status, including review of consultants reports, other relevant assessments, and collaboration with appropriate care team members and the patient's provider was performed as part of comprehensive patient evaluation and provision of chronic care management services.    SDOH (Social Determinants of Health) assessments performed: Yes    Outpatient Encounter Medications as of 01/26/2020  Medication Sig  . Acetaminophen (TYLENOL ARTHRITIS PAIN PO) Take by mouth. Once a day  . albuterol (PROVENTIL HFA;VENTOLIN HFA) 108 (90 BASE) MCG/ACT inhaler Inhale into the lungs every 6 (six) hours as needed for wheezing or shortness of breath.  [name] aspirin EC 81 MG tablet Take 1 tablet (81 mg total) by mouth daily.  . carbidopa-levodopa (SINEMET IR) 25-100 MG tablet Take 1/2 tablet three times a day with meals, if no side effects after a week, increase to 1 tablet three times a day with meals (Patient not taking: Reported on 12/27/2019)  . Cholecalciferol (VITAMIN D-3 PO) Take 5,000 Units/oz/day by mouth daily. PM   . Cyanocobalamin (VITAMIN B-12) 5000 MCG SUBL Place under the tongue daily.  [name] donepezil (ARICEPT) 10 MG tablet Take 1 tablet daily  . levothyroxine (SYNTHROID) 50 MCG tablet TAKE 1 TABLET(50 MCG) BY MOUTH DAILY BEFORE BREAKFAST  . loratadine (CLARITIN) 10 MG tablet Take 10 mg by mouth daily as needed for allergies. PM  . meloxicam (MOBIC) 15 MG tablet TAKE 1 TABLET BY MOUTH DAILY AS NEEDED FOR PAIN AND ARTHRITIS FLARE, TAKE UP TO 1 TO 2 WEEKS AT A TIME  . mirtazapine (REMERON) 15 MG tablet Take 1 tablet (15 mg total) by mouth at bedtime.  [name] neomycin-polymyxin-pramoxine  (NEOSPORIN PLUS) 1 % cream Apply topically 2 (two) times daily.   No facility-administered encounter medications on file as of 01/26/2020.     Goals Addressed    .  SW- "We need more in home support for [name]." (pt-stated)        CARE PLAN ENTRY (see longitudinal plan of care for additional care plan information)  Current Barriers:  . Financial constraints related to affording a private caregiver . Limited social support . Level of care concerns . ADL IADL limitations . Social Isolation . Limited access to caregiver . Inability to perform ADL's independently . Inability to perform IADL's independently . Lacks knowledge of community resource: available support groups and senior resources within their area  Clinical Social Work Clinical Goal(s):  [name] Over the next 120 days, patient/caregiver will work with SW, RNCM and C3 Guide to address concerns related to care coordination needs and lack of education/support/resource connection. LCSW will assist patient in gaining community resource education and additional support and resource connection as well in order to maintain health and mental health appropriately  . Over the next 120 days, patient will demonstrate improved adherence to self care as evidenced by implementing healthy self-care into her daily routine such as: attending all medical appointments, deep breathing exercises, taking time for self-reflection, taking medications as prescribed, participate in PT/OT, drinking water and daily exercise to improve mobility and mood.  . Over the next 120 days, patient will demonstrate improved health management independence as evidenced by implementing healthy self-care skills and  positive support/resources into her daily routine to help cope with stressors and improve overall health and well-being  . Over the next 120 days, patient or caregiver will verbalize basic understanding of depression/stress process and self health management plan as  evidenced by her participation in development of long term plan of care and institution of self health management strategies  Interventions: . Inter-disciplinary care team collaboration (see longitudinal plan of care) . Patient interviewed and appropriate assessments performed . Referred patient to community resources care guide team for assistance with in home support. CCM RNCM has already placed C3 referral. LCSW will not make duplicate referral but will send update to C3 team.  . Provided mental health counseling with regard to caregiver burnout prevention. Caregiver was encouraged to start attending support groups within her area to gain emotional support and additional personal care resource education connections.  . [facility]- [facility], OT and Aide services continues to be involved. Aide is only able to complete 4 visits total. Patient has already received a few visits (aide provides sponge bath) was completed which provided spouse with care giver relief.  . Provided patient's caregiver with information about C.H.O.R.E program, [facility], Medicaid eligibility and Day Programs. Family reports that they would not qualify for Medicaid. They are interested in gaining in home support but are unable to pay for these services privately. Family is interested in C.H.O.R.E program.  . Discussed plans with patient for ongoing care management follow up and provided patient with direct contact information for care management team . Advised patient's caregiver to check email for dementia support groups, senior center information and [place] that was emailed securely to spouse on 01/12/20. Resources successfully received per spouse.  . Assisted patient/caregiver with obtaining information about health plan benefits . Provided education and assistance to client regarding Advanced Directives. . Provided education to patient/caregiver regarding level of care options. . Provided  education to patient/caregiver about Hospice and/or Palliative Care services . Spouse reports having a friend come by to help her with care giving which has been helpful.   Patient Self Care Activities:  . Attends all scheduled provider appointments . Calls provider office for new concerns or questions . Lacks social connections . Unable to perform ADLs independently . Unable to perform IADLs independently  Please see past updates related to this goal by clicking on the "Past Updates" button in the selected goal       Follow Up Plan:  LCSW will follow up with family over the next quarter.   Eula Fried, BSW, MSW, [email or web address] Phone: [phone]

## 2020-01-27 DIAGNOSIS — R1312 Dysphagia, oropharyngeal phase: Secondary | ICD-10-CM | POA: Diagnosis not present

## 2020-01-27 DIAGNOSIS — J310 Chronic rhinitis: Secondary | ICD-10-CM | POA: Diagnosis not present

## 2020-01-27 DIAGNOSIS — J45909 Unspecified asthma, uncomplicated: Secondary | ICD-10-CM | POA: Diagnosis not present

## 2020-01-27 DIAGNOSIS — Z79899 Other long term (current) drug therapy: Secondary | ICD-10-CM | POA: Diagnosis not present

## 2020-01-27 DIAGNOSIS — M170 Bilateral primary osteoarthritis of knee: Secondary | ICD-10-CM | POA: Diagnosis not present

## 2020-01-27 DIAGNOSIS — M810 Age-related osteoporosis without current pathological fracture: Secondary | ICD-10-CM | POA: Diagnosis not present

## 2020-01-27 DIAGNOSIS — E440 Moderate protein-calorie malnutrition: Secondary | ICD-10-CM | POA: Diagnosis not present

## 2020-01-27 DIAGNOSIS — Z9181 History of falling: Secondary | ICD-10-CM | POA: Diagnosis not present

## 2020-01-27 DIAGNOSIS — K5790 Diverticulosis of intestine, part unspecified, without perforation or abscess without bleeding: Secondary | ICD-10-CM | POA: Diagnosis not present

## 2020-01-27 DIAGNOSIS — Z87891 Personal history of nicotine dependence: Secondary | ICD-10-CM | POA: Diagnosis not present

## 2020-01-27 DIAGNOSIS — Z7982 Long term (current) use of aspirin: Secondary | ICD-10-CM | POA: Diagnosis not present

## 2020-01-27 DIAGNOSIS — M160 Bilateral primary osteoarthritis of hip: Secondary | ICD-10-CM | POA: Diagnosis not present

## 2020-01-27 DIAGNOSIS — E785 Hyperlipidemia, unspecified: Secondary | ICD-10-CM | POA: Diagnosis not present

## 2020-01-27 DIAGNOSIS — H9190 Unspecified hearing loss, unspecified ear: Secondary | ICD-10-CM | POA: Diagnosis not present

## 2020-01-27 DIAGNOSIS — E039 Hypothyroidism, unspecified: Secondary | ICD-10-CM | POA: Diagnosis not present

## 2020-01-27 DIAGNOSIS — Z8673 Personal history of transient ischemic attack (TIA), and cerebral infarction without residual deficits: Secondary | ICD-10-CM | POA: Diagnosis not present

## 2020-01-30 DIAGNOSIS — H9190 Unspecified hearing loss, unspecified ear: Secondary | ICD-10-CM | POA: Diagnosis not present

## 2020-01-30 DIAGNOSIS — Z9181 History of falling: Secondary | ICD-10-CM | POA: Diagnosis not present

## 2020-01-30 DIAGNOSIS — Z7982 Long term (current) use of aspirin: Secondary | ICD-10-CM | POA: Diagnosis not present

## 2020-01-30 DIAGNOSIS — M810 Age-related osteoporosis without current pathological fracture: Secondary | ICD-10-CM | POA: Diagnosis not present

## 2020-01-30 DIAGNOSIS — J45909 Unspecified asthma, uncomplicated: Secondary | ICD-10-CM | POA: Diagnosis not present

## 2020-01-30 DIAGNOSIS — J310 Chronic rhinitis: Secondary | ICD-10-CM | POA: Diagnosis not present

## 2020-01-30 DIAGNOSIS — E440 Moderate protein-calorie malnutrition: Secondary | ICD-10-CM | POA: Diagnosis not present

## 2020-01-30 DIAGNOSIS — K5790 Diverticulosis of intestine, part unspecified, without perforation or abscess without bleeding: Secondary | ICD-10-CM | POA: Diagnosis not present

## 2020-01-30 DIAGNOSIS — M170 Bilateral primary osteoarthritis of knee: Secondary | ICD-10-CM | POA: Diagnosis not present

## 2020-01-30 DIAGNOSIS — Z79899 Other long term (current) drug therapy: Secondary | ICD-10-CM | POA: Diagnosis not present

## 2020-01-30 DIAGNOSIS — R1312 Dysphagia, oropharyngeal phase: Secondary | ICD-10-CM | POA: Diagnosis not present

## 2020-01-30 DIAGNOSIS — E039 Hypothyroidism, unspecified: Secondary | ICD-10-CM | POA: Diagnosis not present

## 2020-01-30 DIAGNOSIS — Z87891 Personal history of nicotine dependence: Secondary | ICD-10-CM | POA: Diagnosis not present

## 2020-01-30 DIAGNOSIS — E785 Hyperlipidemia, unspecified: Secondary | ICD-10-CM | POA: Diagnosis not present

## 2020-01-30 DIAGNOSIS — M160 Bilateral primary osteoarthritis of hip: Secondary | ICD-10-CM | POA: Diagnosis not present

## 2020-01-30 DIAGNOSIS — Z8673 Personal history of transient ischemic attack (TIA), and cerebral infarction without residual deficits: Secondary | ICD-10-CM | POA: Diagnosis not present

## 2020-01-31 ENCOUNTER — Other Ambulatory Visit: Payer: Self-pay

## 2020-01-31 ENCOUNTER — Telehealth (INDEPENDENT_AMBULATORY_CARE_PROVIDER_SITE_OTHER): Payer: Medicare Other | Admitting: Family Medicine

## 2020-01-31 ENCOUNTER — Encounter: Payer: Self-pay | Admitting: Family Medicine

## 2020-01-31 VITALS — BP 120/70 | HR 76

## 2020-01-31 DIAGNOSIS — E440 Moderate protein-calorie malnutrition: Secondary | ICD-10-CM

## 2020-01-31 DIAGNOSIS — D649 Anemia, unspecified: Secondary | ICD-10-CM | POA: Diagnosis not present

## 2020-01-31 DIAGNOSIS — F0280 Dementia in other diseases classified elsewhere without behavioral disturbance: Secondary | ICD-10-CM

## 2020-01-31 DIAGNOSIS — N1832 Chronic kidney disease, stage 3b: Secondary | ICD-10-CM | POA: Diagnosis not present

## 2020-01-31 DIAGNOSIS — G3183 Dementia with Lewy bodies: Secondary | ICD-10-CM | POA: Diagnosis not present

## 2020-01-31 DIAGNOSIS — R972 Elevated prostate specific antigen [PSA]: Secondary | ICD-10-CM

## 2020-01-31 NOTE — Assessment & Plan Note (Addendum)
Recently developing issue unsure exact onset, found to be mild low within past year No overt bleeding or other obvious source Can be nutritional deficiency is possible On ASA 81 for prevention - discussed at age 79 with dementia, high fall risk would advise DC Aspirin to avoid bleeding complication  He is not a good candidate for extensive work up for source of occult bleeding such as colonoscopy or other procedure  Monitor CBC in 3 months

## 2020-01-31 NOTE — Patient Instructions (Addendum)
Stop Aspirin 81mg  - due to anemia For now keep watch for bleeding We can re-check labs in 3 months - if needed we can do a visit Protein levels and albumin reduced again Kidney function improved but stable chronic kidney disease  DUE for FASTING BLOOD WORK (no food or drink after midnight before the lab appointment, only water or coffee without cream/sugar on the morning of)  SCHEDULE "Lab Only" visit in the morning at the clinic for lab draw in 3 MONTHS  - Make sure Lab Only appointment is at about 1 week before your next appointment, so that results will be available  For Lab Results, once available within 2-3 days of blood draw, you can can log in to MyChart online to view your results and a brief explanation. Also, we can discuss results at next follow-up visit.   Please schedule a Follow-up Appointment to: Return in about 3 months (around 05/01/2020) for 3 month fasting lab only (no office visit) then will need to return in March 2022 (6 month follow-up.  If you have any other questions or concerns, please feel free to call the office or send a message through [place]. You may also schedule an earlier appointment if necessary.  Additionally, you may be receiving a survey about your experience at our office within a few days to 1 week by e-mail or mail. We value your feedback.  [name], DO [place]

## 2020-01-31 NOTE — Assessment & Plan Note (Signed)
Stable PSA 4 range now improved from prior >6 Check q 6-12 mo

## 2020-01-31 NOTE — Assessment & Plan Note (Signed)
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

## 2020-01-31 NOTE — Progress Notes (Signed)
Subjective:    Patient ID: Blake Bates, male    DOB: [birth year], 79 y.o.   MRN: [id number]  Blake Bates is a 79 y.o. male presenting on 01/31/2020 for Weight Check (patient will check his weight close to his appt time) and Lab result  Virtual / Telehealth Encounter - Video Visit via MyChart The purpose of this virtual visit is to provide medical care while limiting exposure to the novel coronavirus (COVID19) for both patient and office staff.  Consent was obtained for remote visit:  Yes.   Answered questions that patient had about telehealth interaction:  Yes.   I discussed the limitations, risks, security and privacy concerns of performing an evaluation and management service by video/telephone. I also discussed with the patient that there may be a patient responsible charge related to this service. The patient expressed understanding and agreed to proceed.  Patient Location: Home Provider Location: [facility])  Patient is available and nearby. History provided by spouse, Daniel Nones, patient has dementia.  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 Known prior history of CVA  Interval update, Next apt with Dr Delice Lesch on 10/26, had messaged them recently, regarding dose of Mirtazapine had been increased from 15mg  to 22mg  then up to 30mg  nightly, has improved, still getting up occasionally but improved. Still has some issues with waking up  Reproted to be stable weight, Lindia was unsuccessful in getting a weight from him today. - He has been on [facility] PT, last visit recently, may resume in Jan 2022, based on insurance coverage. - On  CCM management team, speaking with Jeannene Patella and Jerene Pitch - CKD-III improved, Creatinine from 2.29 down to 1.8, improved water intake has helped - Protein Calorie Malnutrition - Albumin down to 3.2 (from 3.5) and Total protein down from 6.3 to 5.8  Lab review Hemoglobin reduced from 12.7 to 11.6 on last check in past 1 month, he is still taking ASA 81 daily but not endorsing bleeding, but admits easy bruising.  He has difficulty leaving home due to cognitive/dementia and balance and high fall risk. Requires assistance.  He is needing increased assistance at home with ADLs and wife is helping  They report today continued generalized weakness, problem with recurrent falls, and continued complicated balance and gait, with known Lewy Body Dementia and balance disorder as managed by Neurology  Now recently started on Carbidopa-Levodopa per Neuro  Seems stable weight without significant loss in past month, now improved protein intake some supplement taken in. 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   Health Maintenance:  PSA Prostate Cancer Screening - Improved to 4.53 from prior reading 6.2 (1 month ago), still elevated but trend is reassuring.  Depression screen [facility] 2/9 12/27/2019 09/19/2019 06/21/2019  Decreased Interest 0 0 0  Down, Depressed, Hopeless 0 0 0  PHQ - 2 Score 0  0 0  Altered sleeping - - -  Tired, decreased energy - - -  Change in appetite - - -  Feeling bad or failure about yourself  - - -  Trouble concentrating - - -  Moving slowly or fidgety/restless - -  -  Suicidal thoughts - - -  PHQ-9 Score - - -  Difficult doing work/chores - - -    Social History   Tobacco Use  . Smoking status: Former Smoker    Packs/day: 0.00    Years: 60.00    Pack years: 0.00    Types: Pipe    Quit date: 05/12/2016    Years since quitting: 3.7  . Smokeless tobacco: Former Systems developer  . Tobacco comment: Self taper down from full pipe daily down to half to quarter pipe  Vaping Use  . Vaping Use: Never used  Substance Use Topics  . Alcohol use: Not Currently    Comment: occasionally beer or wine   . Drug use: No    Review of Systems Per HPI unless specifically indicated above     Objective:    BP 120/70   Pulse 76   SpO2 99%   Wt Readings from Last 3 Encounters:  12/27/19 123 lb (55.8 kg)  12/08/19 130 lb (59 kg)  10/31/19 135 lb (61.2 kg)    Physical Exam   Note examination was completely remotely via video observation objective data only  Gen - chronically ill but currently well appearing HEENT - eyes appear clear without discharge or redness Heart/Lungs - cannot examine virtually - observed no evidence of coughing or labored breathing. Abd - cannot examine virtually  Neuro - awake, alert Psych - not anxious appearing   Results for orders placed or performed in visit on 01/23/20  PSA  Result Value Ref Range   PSA 4.53 (H) < OR = 4.0 ng/mL  CBC with Differential/Platelet  Result Value Ref Range   WBC 8.7 3.8 - 10.8 Thousand/uL   RBC 3.65 (L) 4.20 - 5.80 Million/uL   Hemoglobin 11.6 (L) 13.2 - 17.1 g/dL   HCT 36.1 (L) 38 - 50 %   MCV 98.9 80.0 - 100.0 fL   MCH 31.8 27.0 - 33.0 pg   MCHC 32.1 32.0 - 36.0 g/dL   RDW 12.8 11.0 - 15.0 %   Platelets 343 140 - 400 Thousand/uL   MPV 10.4 7.5 - 12.5 fL   Neutro Abs 6,194 1,500 - 7,800 cells/uL   Lymphs Abs 1,592 850 - 3,900 cells/uL   Absolute Monocytes 670 200 - 950 cells/uL   Eosinophils Absolute 218 15 - 500 cells/uL   Basophils Absolute 26 0 - 200 cells/uL   Neutrophils Relative %  71.2 %   Total Lymphocyte 18.3 %   Monocytes Relative 7.7 %   Eosinophils Relative 2.5 %   Basophils Relative 0.3 %  COMPLETE METABOLIC PANEL WITH GFR  Result Value Ref Range   Glucose, Bld 86 65 - 99 mg/dL   BUN 22 7 - 25 mg/dL   Creat 1.81 (H) 0.70 - 1.18 mg/dL   GFR, Est Non African American 35 (L) > OR = 60 mL/min/1.66m2   GFR, Est African American 40 (L) > OR = 60 mL/min/1.44m2   BUN/Creatinine Ratio 12 6 - 22 (calc)   Sodium 139 135 - 146 mmol/L   Potassium 4.9 3.5 - 5.3 mmol/L   Chloride 103 98 - 110 mmol/L   CO2 26 20 - 32 mmol/L   Calcium 8.7  8.6 - 10.3 mg/dL   Total Protein 5.8 (L) 6.1 - 8.1 g/dL   Albumin 3.2 (L) 3.6 - 5.1 g/dL   Globulin 2.6 1.9 - 3.7 g/dL (calc)   AG Ratio 1.2 1.0 - 2.5 (calc)   Total Bilirubin 0.7 0.2 - 1.2 mg/dL   Alkaline phosphatase (APISO) 158 (H) 35 - 144 U/L   AST 21 10 - 35 U/L   ALT 13 9 - 46 U/L      Assessment & Plan:   Problem List Items Addressed This Visit    Normocytic anemia    Recently developing issue unsure exact onset, found to be mild low within past year No overt bleeding or other obvious source Can be nutritional deficiency is possible On ASA 81 for prevention - discussed at age 9 with dementia, high fall risk would advise DC Aspirin to avoid bleeding complication  He is not a good candidate for extensive work up for source of occult bleeding such as colonoscopy or other procedure  Monitor CBC in 3 months      Relevant Orders   CBC with Differential/Platelet   Moderate protein-calorie malnutrition ([place])    Secondary to poor nutrition appetite Seems unlikely dysphagia issue now, s.p modified barium swallow, no further SLP involvement  No other GI red flag symptoms that would explain his malnutrition weight loss, seems to not be taking in as much still due to patient preferences and food textures.  Improved protein intake with nutrition, weight stable. Albumin / protein result on CMET are reduced further which is  concerning.  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 Continues on Aricept 10mg  daily, Mirtazapine 30mg  nightly Follow up w/ Neurology Dr Delice Lesch as planned 10/26      Relevant Orders   CBC with Differential/Platelet   COMPLETE METABOLIC PANEL WITH GFR   Elevated PSA    Stable PSA 4 range now improved from prior >6 Check q 6-12 mo      CKD (chronic kidney disease), stage III    Stable CKD-IIIb Improved creatinine on last lab, but still remains IIIb category Likely etiology age w/o HTN, DM Limit NSAIDs for OA/DJD - agree to refill Meloxicam proceed with caution, intermittent use advised - < 1-2 week at a time, interval dosing only  Continue regular Tylenol dosing up to 1000mg  TID  F/u Creatinine 3 more months      Relevant Orders   CBC with Differential/Platelet      No orders of the defined types were placed in this encounter.     Follow up plan: Return in about 3 months (around 05/01/2020) for 3 month fasting lab only (no office visit) then will need to return in March 2022 (6 month follow-up.  Future labs ordered for 3 month CMET CBC   Patient verbalizes understanding with the above medical recommendations including the limitation of remote medical advice.  Specific follow-up and call-back criteria were given for patient to follow-up or seek medical care more urgently if needed.  Total duration of direct patient care provided via video conference: 20 minutes   [name], [facility] 01/31/2020,  10:59 AM

## 2020-01-31 NOTE — Assessment & Plan Note (Signed)
Stable CKD-IIIb Improved creatinine on last lab, but still remains IIIb category Likely etiology age w/o HTN, DM Limit NSAIDs for OA/DJD - agree to refill Meloxicam proceed with caution, intermittent use advised - < 1-2 week at a time, interval dosing only  Continue regular Tylenol dosing up to 1000mg  TID  F/u Creatinine 3 more months

## 2020-01-31 NOTE — Assessment & Plan Note (Signed)
Secondary to poor nutrition appetite Seems unlikely dysphagia issue now, s.p modified barium swallow, no further SLP involvement  No other GI red flag symptoms that would explain his malnutrition weight loss, seems to not be taking in as much still due to patient preferences and food textures.  Improved protein intake with nutrition, weight stable. Albumin / protein result on CMET are reduced further which is concerning.  He is on mirtazapine 30mg  nightly as well  Future offer Palliative when ready

## 2020-02-01 ENCOUNTER — Other Ambulatory Visit: Payer: Self-pay | Admitting: Family Medicine

## 2020-02-01 ENCOUNTER — Ambulatory Visit: Payer: Self-pay | Admitting: Licensed Clinical Social Worker

## 2020-02-01 DIAGNOSIS — R1312 Dysphagia, oropharyngeal phase: Secondary | ICD-10-CM | POA: Diagnosis not present

## 2020-02-01 DIAGNOSIS — J310 Chronic rhinitis: Secondary | ICD-10-CM | POA: Diagnosis not present

## 2020-02-01 DIAGNOSIS — K5790 Diverticulosis of intestine, part unspecified, without perforation or abscess without bleeding: Secondary | ICD-10-CM | POA: Diagnosis not present

## 2020-02-01 DIAGNOSIS — M6281 Muscle weakness (generalized): Secondary | ICD-10-CM

## 2020-02-01 DIAGNOSIS — J45909 Unspecified asthma, uncomplicated: Secondary | ICD-10-CM | POA: Diagnosis not present

## 2020-02-01 DIAGNOSIS — R2689 Other abnormalities of gait and mobility: Secondary | ICD-10-CM

## 2020-02-01 DIAGNOSIS — E785 Hyperlipidemia, unspecified: Secondary | ICD-10-CM | POA: Diagnosis not present

## 2020-02-01 DIAGNOSIS — Z8673 Personal history of transient ischemic attack (TIA), and cerebral infarction without residual deficits: Secondary | ICD-10-CM

## 2020-02-01 DIAGNOSIS — N1831 Chronic kidney disease, stage 3a: Secondary | ICD-10-CM | POA: Diagnosis not present

## 2020-02-01 DIAGNOSIS — Z7982 Long term (current) use of aspirin: Secondary | ICD-10-CM | POA: Diagnosis not present

## 2020-02-01 DIAGNOSIS — G3183 Dementia with Lewy bodies: Secondary | ICD-10-CM

## 2020-02-01 DIAGNOSIS — R296 Repeated falls: Secondary | ICD-10-CM

## 2020-02-01 DIAGNOSIS — H53462 Homonymous bilateral field defects, left side: Secondary | ICD-10-CM

## 2020-02-01 DIAGNOSIS — E782 Mixed hyperlipidemia: Secondary | ICD-10-CM | POA: Diagnosis not present

## 2020-02-01 DIAGNOSIS — F0280 Dementia in other diseases classified elsewhere without behavioral disturbance: Secondary | ICD-10-CM

## 2020-02-01 DIAGNOSIS — Z9181 History of falling: Secondary | ICD-10-CM | POA: Diagnosis not present

## 2020-02-01 DIAGNOSIS — N183 Chronic kidney disease, stage 3 unspecified: Secondary | ICD-10-CM | POA: Diagnosis not present

## 2020-02-01 DIAGNOSIS — Z87891 Personal history of nicotine dependence: Secondary | ICD-10-CM | POA: Diagnosis not present

## 2020-02-01 DIAGNOSIS — M170 Bilateral primary osteoarthritis of knee: Secondary | ICD-10-CM | POA: Diagnosis not present

## 2020-02-01 DIAGNOSIS — E440 Moderate protein-calorie malnutrition: Secondary | ICD-10-CM | POA: Diagnosis not present

## 2020-02-01 DIAGNOSIS — M160 Bilateral primary osteoarthritis of hip: Secondary | ICD-10-CM | POA: Diagnosis not present

## 2020-02-01 DIAGNOSIS — E034 Atrophy of thyroid (acquired): Secondary | ICD-10-CM

## 2020-02-01 DIAGNOSIS — N179 Acute kidney failure, unspecified: Secondary | ICD-10-CM | POA: Diagnosis not present

## 2020-02-01 DIAGNOSIS — M810 Age-related osteoporosis without current pathological fracture: Secondary | ICD-10-CM | POA: Diagnosis not present

## 2020-02-01 DIAGNOSIS — E039 Hypothyroidism, unspecified: Secondary | ICD-10-CM | POA: Diagnosis not present

## 2020-02-01 DIAGNOSIS — Z79899 Other long term (current) drug therapy: Secondary | ICD-10-CM | POA: Diagnosis not present

## 2020-02-01 DIAGNOSIS — H9190 Unspecified hearing loss, unspecified ear: Secondary | ICD-10-CM | POA: Diagnosis not present

## 2020-02-01 NOTE — Progress Notes (Signed)
Referral to home health placed as the CCM team requested

## 2020-02-01 NOTE — Chronic Care Management (AMB) (Signed)
Care Management   Follow Up Note   02/01/2020 Name: Blake Bates MRN: [id number] DOB: [birth year]  Referred by: [name], DO Reason for referral : Care Coordination   Blake Bates is a 79 y.o. year old male who is a primary care patient of [name], DO. The care management team was consulted for assistance with care management and care coordination needs.    Review of patient status, including review of consultants reports, relevant laboratory and other test results, and collaboration with appropriate care team members and the patient's provider was performed as part of comprehensive patient evaluation and provision of chronic care management services.    SDOH (Social Determinants of Health) assessments performed: Yes See Care Plan activities for detailed interventions related to [facility])     Advanced Directives: See Care Plan and Vynca application for related entries.   Goals Addressed    .  SW- "We need more in home support for Blake Bates." (pt-stated)        CARE PLAN ENTRY (see longitudinal plan of care for additional care plan information)  Current Barriers:  . Financial constraints related to affording a private caregiver . Limited social support . Level of care concerns . ADL IADL limitations . Social Isolation . Limited access to caregiver . Inability to perform ADL's independently . Inability to perform IADL's independently . Lacks knowledge of community resource: available support groups and senior resources within their area  Clinical Social Work Clinical Goal(s):  [name] Over the next 120 days, patient/caregiver will work with SW, RNCM and C3 Guide to address concerns related to care coordination needs and lack of education/support/resource connection. LCSW will assist patient in gaining community resource education and additional support and resource connection as well in order to maintain health and mental health appropriately   . Over the next 120 days, patient will demonstrate improved adherence to self care as evidenced by implementing healthy self-care into her daily routine such as: attending all medical appointments, deep breathing exercises, taking time for self-reflection, taking medications as prescribed, participate in PT/OT, drinking water and daily exercise to improve mobility and mood.  . Over the next 120 days, patient will demonstrate improved health management independence as evidenced by implementing healthy self-care skills and positive support/resources into her daily routine to help cope with stressors and improve overall health and well-being  . Over the next 120 days, patient or caregiver will verbalize basic understanding of depression/stress process and self health management plan as evidenced by her participation in development of long term plan of care and institution of self health management strategies  Interventions: . Inter-disciplinary care team collaboration (see longitudinal plan of care) . Patient interviewed and appropriate assessments performed . Referred patient to community resources care guide team for assistance with in home support. CCM RNCM has already placed C3 referral. LCSW will not make duplicate referral but will send update to C3 team.  . Provided mental health counseling with regard to caregiver burnout prevention. Caregiver was encouraged to start attending support groups within her area to gain emotional support and additional personal care resource education connections.  . [facility]- PT, OT and Aide services continues to be involved. Aide is only able to complete 4 visits total. Patient has already received a few visits (aide provides sponge bath) was completed which provided spouse with care giver relief.  . Provided patient's caregiver with information about C.H.O.R.E program, [facility], Medicaid eligibility and Day Programs. Family reports that they  would not  qualify for Medicaid. They are interested in gaining in home support but are unable to pay for these services privately. Family is interested in C.H.O.R.E program.  . Discussed plans with patient for ongoing care management follow up and provided patient with direct contact information for care management team . Advised patient's caregiver to check email for dementia support groups, senior center information and [place] that was emailed securely to spouse on 01/12/20. Resources successfully received per spouse.  . Assisted patient/caregiver with obtaining information about health plan benefits . Provided education and assistance to client regarding Advanced Directives. . Provided education to patient/caregiver regarding level of care options. . Provided education to patient/caregiver about Hospice and/or Palliative Care services . Spouse reports having a friend come by to help her with care giving which has been helpful.  [name] CARE COORDINATION UPDATE- LCSW, PCP's coverage partner Cyndia Skeeters, NP at [facility]) and Care Guide completed 3 way call and discussed case. Care Guide was in need of two specific Medicare codes in order to get personal care service approval. Cyndia Skeeters, NP completed order for [facility] orders as well. Care Guide has worked over and beyond on helping family gain additional care within the home and will keep CCM team updated for any future needs regarding this referral. LCSW will update PCP who is currently out on PAL at this time.   Patient Self Care Activities:  . Attends all scheduled provider appointments . Calls provider office for new concerns or questions . Lacks social connections . Unable to perform ADLs independently . Unable to perform IADLs independently  Please see past updates related to this goal by clicking on the "Past Updates" button in the selected goal       The care management team will reach out to the patient again over the next 45  days.   Eula Fried, BSW, MSW, [email or web address] Phone: [phone]

## 2020-02-02 ENCOUNTER — Other Ambulatory Visit: Payer: Medicare Other

## 2020-02-02 DIAGNOSIS — Z7982 Long term (current) use of aspirin: Secondary | ICD-10-CM | POA: Diagnosis not present

## 2020-02-02 DIAGNOSIS — Z79899 Other long term (current) drug therapy: Secondary | ICD-10-CM | POA: Diagnosis not present

## 2020-02-02 DIAGNOSIS — Z9181 History of falling: Secondary | ICD-10-CM | POA: Diagnosis not present

## 2020-02-02 DIAGNOSIS — J310 Chronic rhinitis: Secondary | ICD-10-CM | POA: Diagnosis not present

## 2020-02-02 DIAGNOSIS — J45909 Unspecified asthma, uncomplicated: Secondary | ICD-10-CM | POA: Diagnosis not present

## 2020-02-02 DIAGNOSIS — M170 Bilateral primary osteoarthritis of knee: Secondary | ICD-10-CM | POA: Diagnosis not present

## 2020-02-02 DIAGNOSIS — K5790 Diverticulosis of intestine, part unspecified, without perforation or abscess without bleeding: Secondary | ICD-10-CM | POA: Diagnosis not present

## 2020-02-02 DIAGNOSIS — R1312 Dysphagia, oropharyngeal phase: Secondary | ICD-10-CM | POA: Diagnosis not present

## 2020-02-02 DIAGNOSIS — Z8673 Personal history of transient ischemic attack (TIA), and cerebral infarction without residual deficits: Secondary | ICD-10-CM | POA: Diagnosis not present

## 2020-02-02 DIAGNOSIS — H9190 Unspecified hearing loss, unspecified ear: Secondary | ICD-10-CM | POA: Diagnosis not present

## 2020-02-02 DIAGNOSIS — Z87891 Personal history of nicotine dependence: Secondary | ICD-10-CM | POA: Diagnosis not present

## 2020-02-02 DIAGNOSIS — E039 Hypothyroidism, unspecified: Secondary | ICD-10-CM | POA: Diagnosis not present

## 2020-02-02 DIAGNOSIS — M160 Bilateral primary osteoarthritis of hip: Secondary | ICD-10-CM | POA: Diagnosis not present

## 2020-02-02 DIAGNOSIS — E440 Moderate protein-calorie malnutrition: Secondary | ICD-10-CM | POA: Diagnosis not present

## 2020-02-02 DIAGNOSIS — M810 Age-related osteoporosis without current pathological fracture: Secondary | ICD-10-CM | POA: Diagnosis not present

## 2020-02-02 DIAGNOSIS — E785 Hyperlipidemia, unspecified: Secondary | ICD-10-CM | POA: Diagnosis not present

## 2020-02-03 DIAGNOSIS — K5790 Diverticulosis of intestine, part unspecified, without perforation or abscess without bleeding: Secondary | ICD-10-CM | POA: Diagnosis not present

## 2020-02-03 DIAGNOSIS — E039 Hypothyroidism, unspecified: Secondary | ICD-10-CM | POA: Diagnosis not present

## 2020-02-03 DIAGNOSIS — E440 Moderate protein-calorie malnutrition: Secondary | ICD-10-CM | POA: Diagnosis not present

## 2020-02-03 DIAGNOSIS — Z7982 Long term (current) use of aspirin: Secondary | ICD-10-CM | POA: Diagnosis not present

## 2020-02-03 DIAGNOSIS — Z8673 Personal history of transient ischemic attack (TIA), and cerebral infarction without residual deficits: Secondary | ICD-10-CM | POA: Diagnosis not present

## 2020-02-03 DIAGNOSIS — M170 Bilateral primary osteoarthritis of knee: Secondary | ICD-10-CM | POA: Diagnosis not present

## 2020-02-03 DIAGNOSIS — Z79899 Other long term (current) drug therapy: Secondary | ICD-10-CM | POA: Diagnosis not present

## 2020-02-03 DIAGNOSIS — Z87891 Personal history of nicotine dependence: Secondary | ICD-10-CM | POA: Diagnosis not present

## 2020-02-03 DIAGNOSIS — E785 Hyperlipidemia, unspecified: Secondary | ICD-10-CM | POA: Diagnosis not present

## 2020-02-03 DIAGNOSIS — Z9181 History of falling: Secondary | ICD-10-CM | POA: Diagnosis not present

## 2020-02-03 DIAGNOSIS — M160 Bilateral primary osteoarthritis of hip: Secondary | ICD-10-CM | POA: Diagnosis not present

## 2020-02-03 DIAGNOSIS — J45909 Unspecified asthma, uncomplicated: Secondary | ICD-10-CM | POA: Diagnosis not present

## 2020-02-03 DIAGNOSIS — M810 Age-related osteoporosis without current pathological fracture: Secondary | ICD-10-CM | POA: Diagnosis not present

## 2020-02-03 DIAGNOSIS — R1312 Dysphagia, oropharyngeal phase: Secondary | ICD-10-CM | POA: Diagnosis not present

## 2020-02-03 DIAGNOSIS — H9190 Unspecified hearing loss, unspecified ear: Secondary | ICD-10-CM | POA: Diagnosis not present

## 2020-02-03 DIAGNOSIS — J310 Chronic rhinitis: Secondary | ICD-10-CM | POA: Diagnosis not present

## 2020-02-07 DIAGNOSIS — J310 Chronic rhinitis: Secondary | ICD-10-CM | POA: Diagnosis not present

## 2020-02-07 DIAGNOSIS — Z87891 Personal history of nicotine dependence: Secondary | ICD-10-CM | POA: Diagnosis not present

## 2020-02-07 DIAGNOSIS — M810 Age-related osteoporosis without current pathological fracture: Secondary | ICD-10-CM | POA: Diagnosis not present

## 2020-02-07 DIAGNOSIS — M160 Bilateral primary osteoarthritis of hip: Secondary | ICD-10-CM | POA: Diagnosis not present

## 2020-02-07 DIAGNOSIS — Z8673 Personal history of transient ischemic attack (TIA), and cerebral infarction without residual deficits: Secondary | ICD-10-CM | POA: Diagnosis not present

## 2020-02-07 DIAGNOSIS — E440 Moderate protein-calorie malnutrition: Secondary | ICD-10-CM | POA: Diagnosis not present

## 2020-02-07 DIAGNOSIS — K5790 Diverticulosis of intestine, part unspecified, without perforation or abscess without bleeding: Secondary | ICD-10-CM | POA: Diagnosis not present

## 2020-02-07 DIAGNOSIS — E785 Hyperlipidemia, unspecified: Secondary | ICD-10-CM | POA: Diagnosis not present

## 2020-02-07 DIAGNOSIS — E039 Hypothyroidism, unspecified: Secondary | ICD-10-CM | POA: Diagnosis not present

## 2020-02-07 DIAGNOSIS — J45909 Unspecified asthma, uncomplicated: Secondary | ICD-10-CM | POA: Diagnosis not present

## 2020-02-07 DIAGNOSIS — Z9181 History of falling: Secondary | ICD-10-CM | POA: Diagnosis not present

## 2020-02-07 DIAGNOSIS — R1312 Dysphagia, oropharyngeal phase: Secondary | ICD-10-CM | POA: Diagnosis not present

## 2020-02-07 DIAGNOSIS — M170 Bilateral primary osteoarthritis of knee: Secondary | ICD-10-CM | POA: Diagnosis not present

## 2020-02-07 DIAGNOSIS — H9190 Unspecified hearing loss, unspecified ear: Secondary | ICD-10-CM | POA: Diagnosis not present

## 2020-02-07 DIAGNOSIS — Z7982 Long term (current) use of aspirin: Secondary | ICD-10-CM | POA: Diagnosis not present

## 2020-02-07 DIAGNOSIS — Z79899 Other long term (current) drug therapy: Secondary | ICD-10-CM | POA: Diagnosis not present

## 2020-02-09 DIAGNOSIS — Z79899 Other long term (current) drug therapy: Secondary | ICD-10-CM | POA: Diagnosis not present

## 2020-02-09 DIAGNOSIS — M810 Age-related osteoporosis without current pathological fracture: Secondary | ICD-10-CM | POA: Diagnosis not present

## 2020-02-09 DIAGNOSIS — H9190 Unspecified hearing loss, unspecified ear: Secondary | ICD-10-CM | POA: Diagnosis not present

## 2020-02-09 DIAGNOSIS — J310 Chronic rhinitis: Secondary | ICD-10-CM | POA: Diagnosis not present

## 2020-02-09 DIAGNOSIS — R1312 Dysphagia, oropharyngeal phase: Secondary | ICD-10-CM | POA: Diagnosis not present

## 2020-02-09 DIAGNOSIS — Z7982 Long term (current) use of aspirin: Secondary | ICD-10-CM | POA: Diagnosis not present

## 2020-02-09 DIAGNOSIS — E440 Moderate protein-calorie malnutrition: Secondary | ICD-10-CM | POA: Diagnosis not present

## 2020-02-09 DIAGNOSIS — E785 Hyperlipidemia, unspecified: Secondary | ICD-10-CM | POA: Diagnosis not present

## 2020-02-09 DIAGNOSIS — K5790 Diverticulosis of intestine, part unspecified, without perforation or abscess without bleeding: Secondary | ICD-10-CM | POA: Diagnosis not present

## 2020-02-09 DIAGNOSIS — M160 Bilateral primary osteoarthritis of hip: Secondary | ICD-10-CM | POA: Diagnosis not present

## 2020-02-09 DIAGNOSIS — J45909 Unspecified asthma, uncomplicated: Secondary | ICD-10-CM | POA: Diagnosis not present

## 2020-02-09 DIAGNOSIS — E039 Hypothyroidism, unspecified: Secondary | ICD-10-CM | POA: Diagnosis not present

## 2020-02-09 DIAGNOSIS — M170 Bilateral primary osteoarthritis of knee: Secondary | ICD-10-CM | POA: Diagnosis not present

## 2020-02-09 DIAGNOSIS — Z8673 Personal history of transient ischemic attack (TIA), and cerebral infarction without residual deficits: Secondary | ICD-10-CM | POA: Diagnosis not present

## 2020-02-09 DIAGNOSIS — Z9181 History of falling: Secondary | ICD-10-CM | POA: Diagnosis not present

## 2020-02-09 DIAGNOSIS — Z87891 Personal history of nicotine dependence: Secondary | ICD-10-CM | POA: Diagnosis not present

## 2020-02-10 DIAGNOSIS — K5790 Diverticulosis of intestine, part unspecified, without perforation or abscess without bleeding: Secondary | ICD-10-CM | POA: Diagnosis not present

## 2020-02-10 DIAGNOSIS — M170 Bilateral primary osteoarthritis of knee: Secondary | ICD-10-CM | POA: Diagnosis not present

## 2020-02-10 DIAGNOSIS — Z8673 Personal history of transient ischemic attack (TIA), and cerebral infarction without residual deficits: Secondary | ICD-10-CM | POA: Diagnosis not present

## 2020-02-10 DIAGNOSIS — J310 Chronic rhinitis: Secondary | ICD-10-CM | POA: Diagnosis not present

## 2020-02-10 DIAGNOSIS — Z9181 History of falling: Secondary | ICD-10-CM | POA: Diagnosis not present

## 2020-02-10 DIAGNOSIS — M810 Age-related osteoporosis without current pathological fracture: Secondary | ICD-10-CM | POA: Diagnosis not present

## 2020-02-10 DIAGNOSIS — R1312 Dysphagia, oropharyngeal phase: Secondary | ICD-10-CM | POA: Diagnosis not present

## 2020-02-10 DIAGNOSIS — Z79899 Other long term (current) drug therapy: Secondary | ICD-10-CM | POA: Diagnosis not present

## 2020-02-10 DIAGNOSIS — E039 Hypothyroidism, unspecified: Secondary | ICD-10-CM | POA: Diagnosis not present

## 2020-02-10 DIAGNOSIS — E785 Hyperlipidemia, unspecified: Secondary | ICD-10-CM | POA: Diagnosis not present

## 2020-02-10 DIAGNOSIS — E440 Moderate protein-calorie malnutrition: Secondary | ICD-10-CM | POA: Diagnosis not present

## 2020-02-10 DIAGNOSIS — Z87891 Personal history of nicotine dependence: Secondary | ICD-10-CM | POA: Diagnosis not present

## 2020-02-10 DIAGNOSIS — Z7982 Long term (current) use of aspirin: Secondary | ICD-10-CM | POA: Diagnosis not present

## 2020-02-10 DIAGNOSIS — J45909 Unspecified asthma, uncomplicated: Secondary | ICD-10-CM | POA: Diagnosis not present

## 2020-02-10 DIAGNOSIS — H9190 Unspecified hearing loss, unspecified ear: Secondary | ICD-10-CM | POA: Diagnosis not present

## 2020-02-10 DIAGNOSIS — M160 Bilateral primary osteoarthritis of hip: Secondary | ICD-10-CM | POA: Diagnosis not present

## 2020-02-13 MED ORDER — MIRTAZAPINE 30 MG PO TABS: 30.0000 mg | ORAL_TABLET | Freq: Every day | ORAL | 3 refills | Status: DC

## 2020-02-14 DIAGNOSIS — Z7982 Long term (current) use of aspirin: Secondary | ICD-10-CM | POA: Diagnosis not present

## 2020-02-14 DIAGNOSIS — M170 Bilateral primary osteoarthritis of knee: Secondary | ICD-10-CM | POA: Diagnosis not present

## 2020-02-14 DIAGNOSIS — E039 Hypothyroidism, unspecified: Secondary | ICD-10-CM | POA: Diagnosis not present

## 2020-02-14 DIAGNOSIS — J45909 Unspecified asthma, uncomplicated: Secondary | ICD-10-CM | POA: Diagnosis not present

## 2020-02-14 DIAGNOSIS — Z9181 History of falling: Secondary | ICD-10-CM | POA: Diagnosis not present

## 2020-02-14 DIAGNOSIS — Z79899 Other long term (current) drug therapy: Secondary | ICD-10-CM | POA: Diagnosis not present

## 2020-02-14 DIAGNOSIS — R1312 Dysphagia, oropharyngeal phase: Secondary | ICD-10-CM | POA: Diagnosis not present

## 2020-02-14 DIAGNOSIS — E440 Moderate protein-calorie malnutrition: Secondary | ICD-10-CM | POA: Diagnosis not present

## 2020-02-14 DIAGNOSIS — J310 Chronic rhinitis: Secondary | ICD-10-CM | POA: Diagnosis not present

## 2020-02-14 DIAGNOSIS — K5790 Diverticulosis of intestine, part unspecified, without perforation or abscess without bleeding: Secondary | ICD-10-CM | POA: Diagnosis not present

## 2020-02-14 DIAGNOSIS — E785 Hyperlipidemia, unspecified: Secondary | ICD-10-CM | POA: Diagnosis not present

## 2020-02-14 DIAGNOSIS — Z87891 Personal history of nicotine dependence: Secondary | ICD-10-CM | POA: Diagnosis not present

## 2020-02-14 DIAGNOSIS — M810 Age-related osteoporosis without current pathological fracture: Secondary | ICD-10-CM | POA: Diagnosis not present

## 2020-02-14 DIAGNOSIS — Z8673 Personal history of transient ischemic attack (TIA), and cerebral infarction without residual deficits: Secondary | ICD-10-CM | POA: Diagnosis not present

## 2020-02-14 DIAGNOSIS — M160 Bilateral primary osteoarthritis of hip: Secondary | ICD-10-CM | POA: Diagnosis not present

## 2020-02-14 DIAGNOSIS — H9190 Unspecified hearing loss, unspecified ear: Secondary | ICD-10-CM | POA: Diagnosis not present

## 2020-02-16 ENCOUNTER — Telehealth: Payer: Self-pay | Admitting: General Practice

## 2020-02-16 ENCOUNTER — Ambulatory Visit (INDEPENDENT_AMBULATORY_CARE_PROVIDER_SITE_OTHER): Payer: Medicare Other | Admitting: General Practice

## 2020-02-16 DIAGNOSIS — E039 Hypothyroidism, unspecified: Secondary | ICD-10-CM | POA: Diagnosis not present

## 2020-02-16 DIAGNOSIS — J45909 Unspecified asthma, uncomplicated: Secondary | ICD-10-CM | POA: Diagnosis not present

## 2020-02-16 DIAGNOSIS — M170 Bilateral primary osteoarthritis of knee: Secondary | ICD-10-CM | POA: Diagnosis not present

## 2020-02-16 DIAGNOSIS — M160 Bilateral primary osteoarthritis of hip: Secondary | ICD-10-CM | POA: Diagnosis not present

## 2020-02-16 DIAGNOSIS — M810 Age-related osteoporosis without current pathological fracture: Secondary | ICD-10-CM | POA: Diagnosis not present

## 2020-02-16 DIAGNOSIS — R1312 Dysphagia, oropharyngeal phase: Secondary | ICD-10-CM | POA: Diagnosis not present

## 2020-02-16 DIAGNOSIS — E440 Moderate protein-calorie malnutrition: Secondary | ICD-10-CM | POA: Diagnosis not present

## 2020-02-16 DIAGNOSIS — Z7982 Long term (current) use of aspirin: Secondary | ICD-10-CM | POA: Diagnosis not present

## 2020-02-16 DIAGNOSIS — Z9181 History of falling: Secondary | ICD-10-CM | POA: Diagnosis not present

## 2020-02-16 DIAGNOSIS — Z87891 Personal history of nicotine dependence: Secondary | ICD-10-CM | POA: Diagnosis not present

## 2020-02-16 DIAGNOSIS — E034 Atrophy of thyroid (acquired): Secondary | ICD-10-CM | POA: Diagnosis not present

## 2020-02-16 DIAGNOSIS — K5790 Diverticulosis of intestine, part unspecified, without perforation or abscess without bleeding: Secondary | ICD-10-CM | POA: Diagnosis not present

## 2020-02-16 DIAGNOSIS — Z8673 Personal history of transient ischemic attack (TIA), and cerebral infarction without residual deficits: Secondary | ICD-10-CM | POA: Diagnosis not present

## 2020-02-16 DIAGNOSIS — E785 Hyperlipidemia, unspecified: Secondary | ICD-10-CM | POA: Diagnosis not present

## 2020-02-16 DIAGNOSIS — E782 Mixed hyperlipidemia: Secondary | ICD-10-CM

## 2020-02-16 DIAGNOSIS — J310 Chronic rhinitis: Secondary | ICD-10-CM | POA: Diagnosis not present

## 2020-02-16 DIAGNOSIS — N1832 Chronic kidney disease, stage 3b: Secondary | ICD-10-CM

## 2020-02-16 DIAGNOSIS — H9190 Unspecified hearing loss, unspecified ear: Secondary | ICD-10-CM | POA: Diagnosis not present

## 2020-02-16 DIAGNOSIS — F0280 Dementia in other diseases classified elsewhere without behavioral disturbance: Secondary | ICD-10-CM

## 2020-02-16 DIAGNOSIS — R296 Repeated falls: Secondary | ICD-10-CM

## 2020-02-16 DIAGNOSIS — G3183 Dementia with Lewy bodies: Secondary | ICD-10-CM | POA: Diagnosis not present

## 2020-02-16 DIAGNOSIS — Z79899 Other long term (current) drug therapy: Secondary | ICD-10-CM | POA: Diagnosis not present

## 2020-02-16 NOTE — Patient Instructions (Signed)
Visit Information  Goals Addressed              This Visit's Progress     RNCM-Pt's wife:"I am having a hard time getting him to eat" (pt-stated)        CARE PLAN ENTRY (see longtitudinal plan of care for additional care plan information)  Current Barriers:   Chronic Disease Management support, education, and care coordination needs related to HLD, CKD Stage 3, Pulmonary Disease, hypothyroidism and Lewy Body Dementia   Clinical Goal(s) related to HLD, CKD Stage 3, Pulmonary Disease, hypothyroidism and Lewy Body Dementia:  Over the next 120 days, patient will:   Work with the care management team to address educational, disease management, and care coordination needs   Begin or continue self health monitoring activities as directed today Measure and record blood pressure 2/3 times per week and encouraging meals to help with nutritional needs and decline in weight loss  Call provider office for new or worsened signs and symptoms Blood pressure findings outside established parameters, Chest pain, Shortness of breath, and New or worsened symptom related to hypothyroidism, worsening dementia and other chronic diseases  Call care management team with questions or concerns  Verbalize basic understanding of patient centered plan of care established today   Interventions related to HLD, CKD Stage 3, Pulmonary Disease, hypothyroidism and Lewy Body Dementia:   Evaluation of current treatment plans and patient's adherence to plan as established by provider.  The patients wife is compliant with the current plan of care but states the patient has had a rapid decline in his sx/sx.  CCM support in addition to pcp recommendations in place. The patient has PT/OT coming in the home currently. The patient with increased safety concerns. Ideas and recommendations provided today. See falls care plan. 02-16-2020: the patients wife has worked with the care guide and they have gotten additional approval for  Anne Ng, with holistic care to come in and work with the patient. The patients wife is happy about this additional help.   Assessed patient understanding of disease states.  The patient does not have the ability to comprehend what is taking place with his health and well being. His wife [name] is his primary care giver and understands his condition. Is thankful for CCM support and working with pcp to help with the complex nature of the patients chronic conditions.   Assessed patient's education and care coordination needs.  Care guide referral placed for additional resources and help for the patient with advance dementia. 02-16-2020: Review of caregiver burn out.  The patients wife states she is doing okay. She is thankful for the help and support she is getting. Her children are there when they can be and she sometimes has to call them for help with getting the patient up. The patient is steadily declining in function and memory. The patients wife verbalized she was getting her needs meet.   Provided disease specific education to patient.  Review of dietary intake. The patient does not have a good appetite and has had wt loss of 7 pounds in one month. The patient does not like Ensure products but did discuss with the patients wife a milkshake type recipe that has protein in it. The wife is willing to try this to see if it will help the patient. There has been episodes when the patient ate something different and thought he was doing well with it but then threw it back up. Cheerios and honey nut cheerios seems to be  the only thing that the patient will eat. Will research to see if there are additional recommendations to help with dietary nutrition and protein for the patient. Will send the recipe to the patients wife by secure text messaging. 02-16-2020: The patient is eating some. The patient seems to be sleeping more. Usually a solid 12 hours. Usually he wants to go to bed by 7:30 pm but the patients wife tries  to keep him up a little longer so that he does not go to bed so early.   Collaborated with appropriate clinical care team members regarding patient needs.  CCM team is involved in the care of the patient. The LCSW has outreach scheduled next week to call and work with the patient and patients wife. The wife is appreciative of any help and resources she can get.    Patient Self Care Activities related to HLD, CKD Stage 3, Pulmonary Disease, hypothyroidism and Lewy Body Dementia:   Patient is unable to independently self-manage chronic health conditions  Please see past updates related to this goal by clicking on the "Past Updates" button in the selected goal        RNCM: Pt's wife:"He had a fall on Saturday" (pt-stated)        [place] (see longitudinal plan of care for additional care plan information)  Current Barriers:   Knowledge Deficits related to fall precautions in patient with Lewy Body Dementia and other Chronic Conditions  Decreased adherence to prescribed treatment for fall prevention  Care Coordination needs related to fall prevention support systems and resources in a patient with Lewy Body Dementia  Lacks caregiver support. Wife is primary caregiver. Patients condition is declining. Needs additional assistance in the home.   Cognitive Deficits  Clinical Goal(s):   Over the next 120 days, patient will demonstrate improved adherence to prescribed treatment plan for decreasing falls as evidenced by patient reporting and review of EMR  Over the next 120 days, patient will verbalize using fall risk reduction strategies discussed  Over the next 120 days, patient will not experience additional falls  Over the next 120 days, patient will verbalize understanding of plan for having safety devices in place to help with safety in the patient with frequent falls and balance issues  Over the next 120 days, patient will work with pcp, RNCM, CCM team and in home PT/OT to  address needs related to Lewy body dementia with frequent falls and decline in condition  Over the next 120 days, patient will attend all scheduled medical appointments: next appointment with the pcp on 01-31-2020  Over the next 120 days, patient will work with care guides  (community agency) to get resources for support for dementia patients and safety devices  Interventions:   Provided written and verbal education re: Potential causes of falls and Fall prevention strategies  Reviewed medications and discussed potential side effects of medications such as dizziness and frequent urination  Assessed for s/s of orthostatic hypotension.  Falls are related to changes in condition and decline from Lewy Body Dementia.  The patient has not issues related to blood pressure at this time. The patients wife is a Marine scientist and is aware of orthostatic hypotension.   Assessed for falls since last encounter. Per the wife the patient fell on Saturday when she had went out briefly to get things they needed at [facility]. When she got back he had went to the kitchen to get cheerios and fell. She had to call her son  in law to come and help get him up.  He did not know who his son in law was. 02-16-2020: The patient has not had any falls but he will be ambulating and just sit down wherever.  Sometimes his wife can not get him up and she has to call for someone to come and help her. He is not wandering at night because  he has to have someone to help him get up. She has transitioned him to a single twin bed and so far he is doing well with this. Discussed using a gait belt to help with stability. The patients wife states she has a gait belt and will start using this.   Assessed patients knowledge of fall risk prevention secondary to previously provided education. 02-16-2020: the patients wife is very knowledgeable about fall risk and safety in the home. She has a paid caregiver that stays with her Monday through Wednesday.  She is also going to have some additional help in the home for bathing.   Assessed working status of life alert bracelet and patient adherence. Advised the patients wife to call [facility] and ask about the [place] life alert system. It should be a free service to the patient if this was a part of their benefit package.  The wife will do this and see if they can get this service. She does have a blink camera that she uses when she is in the other room or out briefly to check in on the patient.   Provided patient information for fall alert systems.  Provided information for the [place] life alert system through [facility] and ask the patients wife to call and inquire about the system as a benefit through [facility].   Evaluation of current treatment plan related to falls and patient's adherence to plan as established by provider.  Provided education to patient re: getting the book the 36 hour day. The patients wife has purchased this on the recommendation of the PT personnel and just received it this week.  Also evaluated and educated the wife on care giver stress and needing to take care of self as she is the primary caregiver of the patient. She does have someone who is now coming in (Started this week) who is there Monday through Thursday and this has been a big help  Discussed plans with patient for ongoing care management follow up and provided patient with direct contact information for care management team  Reviewed scheduled/upcoming provider appointments including: Sees the specialist in 2 weeks for evaluation.   Care Guide referral for community resources to help with caregiver support for patients with dementia and dementia resources.   Patient Self Care Activities:   Utilize walker (assistive device) appropriately with all ambulation.  Per the wife the patient is using his walker more; however she sees a big decline in his coordination and balance. The patient also comes in the room with his shoes on the  wrong feet. He does get up some now at night. She is going to call the neurologist to get an increase in his medication to help with rest.   De-clutter walkways  Change positions slowly  Wear secure fitting shoes at all times with ambulation  Utilize home lighting for dim lit areas  Have self and pet awareness at all times  Plan:  CCM RN CM will follow up in 02-16-2020 at 2:30 pm.  The patients wife has the [facility] contact information to call sooner if needed.   Please see  past updates related to this goal by clicking on the "Past Updates" button in the selected goal         Patient verbalizes understanding of instructions provided today.   Telephone follow up appointment with care management team member scheduled for: 03-22-2020 at 330 pm  Noreene Larsson RN, MSN, [place] Mobile: [phone]

## 2020-02-16 NOTE — Chronic Care Management (AMB) (Signed)
Chronic Care Management   Follow Up Note   02/16/2020 Name: Blake Bates MRN: [id number] DOB: Sep 19, 1940  Referred by: [name], DO Reason for referral : Chronic Care Management (RNCM follow up call for Chronic Disease Management and Care Coordination )   Blake Bates is a 79 y.o. year old male who is a primary care patient of [name], DO. The CCM team was consulted for assistance with chronic disease management and care coordination needs.    Review of patient status, including review of consultants reports, relevant laboratory and other test results, and collaboration with appropriate care team members and the patient's provider was performed as part of comprehensive patient evaluation and provision of chronic care management services.    SDOH (Social Determinants of Health) assessments performed: Yes See Care Plan activities for detailed interventions related to [facility])     Outpatient Encounter Medications as of 02/16/2020  Medication Sig  . Acetaminophen (TYLENOL ARTHRITIS PAIN PO) Take by mouth. Once a day  . albuterol (PROVENTIL HFA;VENTOLIN HFA) 108 (90 BASE) MCG/ACT inhaler Inhale into the lungs every 6 (six) hours as needed for wheezing or shortness of breath.  . Cholecalciferol (VITAMIN D-3 PO) Take 5,000 Units/oz/day by mouth daily. PM   . Cyanocobalamin (VITAMIN B-12) 5000 MCG SUBL Place under the tongue daily.  [name] donepezil (ARICEPT) 10 MG tablet Take 1 tablet daily  . levothyroxine (SYNTHROID) 50 MCG tablet TAKE 1 TABLET(50 MCG) BY MOUTH DAILY BEFORE BREAKFAST  . loratadine (CLARITIN) 10 MG tablet Take 10 mg by mouth daily as needed for allergies. PM  . meloxicam (MOBIC) 15 MG tablet TAKE 1 TABLET BY MOUTH DAILY AS NEEDED FOR PAIN AND ARTHRITIS FLARE, TAKE UP TO 1 TO 2 WEEKS AT A TIME  . mirtazapine (REMERON) 30 MG tablet Take 1 tablet (30 mg total) by mouth at bedtime.  [name] neomycin-polymyxin-pramoxine (NEOSPORIN PLUS) 1 % cream  Apply topically 2 (two) times daily.   No facility-administered encounter medications on file as of 02/16/2020.     Objective:  BP Readings from Last 3 Encounters:  01/31/20 120/70  12/27/19 (!) 120/55  12/08/19 (!) 145/78    Goals Addressed              This Visit's Progress   .  RNCM-Pt's wife:"I am having a hard time getting him to eat" (pt-stated)        CARE PLAN ENTRY (see longtitudinal plan of care for additional care plan information)  Current Barriers:  . Chronic Disease Management support, education, and care coordination needs related to HLD, CKD Stage 3, Pulmonary Disease, hypothyroidism and Lewy Body Dementia  . Clinical Goal(s) related to HLD, CKD Stage 3, Pulmonary Disease, hypothyroidism and Lewy Body Dementia:  Over the next 120 days, patient will:  . Work with the care management team to address educational, disease management, and care coordination needs  . Begin or continue self health monitoring activities as directed today Measure and record blood pressure 2/3 times per week and encouraging meals to help with nutritional needs and decline in weight loss . Call provider office for new or worsened signs and symptoms Blood pressure findings outside established parameters, Chest pain, Shortness of breath, and New or worsened symptom related to hypothyroidism, worsening dementia and other chronic diseases . Call care management team with questions or concerns . Verbalize basic understanding of patient centered plan of care established today  . Interventions related to HLD, CKD Stage 3, Pulmonary Disease, hypothyroidism and Lewy  Body Dementia:  . Evaluation of current treatment plans and patient's adherence to plan as established by provider.  The patients wife is compliant with the current plan of care but states the patient has had a rapid decline in his sx/sx.  CCM support in addition to pcp recommendations in place. The patient has PT/OT coming in the home  currently. The patient with increased safety concerns. Ideas and recommendations provided today. See falls care plan. 02-16-2020: the patients wife has worked with the care guide and they have gotten additional approval for Anne Ng, with holistic care to come in and work with the patient. The patients wife is happy about this additional help.  . Assessed patient understanding of disease states.  The patient does not have the ability to comprehend what is taking place with his health and well being. His wife [name] is his primary care giver and understands his condition. Is thankful for CCM support and working with pcp to help with the complex nature of the patients chronic conditions.  . Assessed patient's education and care coordination needs.  Care guide referral placed for additional resources and help for the patient with advance dementia. 02-16-2020: Review of caregiver burn out.  The patients wife states she is doing okay. She is thankful for the help and support she is getting. Her children are there when they can be and she sometimes has to call them for help with getting the patient up. The patient is steadily declining in function and memory. The patients wife verbalized she was getting her needs meet.  . Provided disease specific education to patient.  Review of dietary intake. The patient does not have a good appetite and has had wt loss of 7 pounds in one month. The patient does not like Ensure products but did discuss with the patients wife a milkshake type recipe that has protein in it. The wife is willing to try this to see if it will help the patient. There has been episodes when the patient ate something different and thought he was doing well with it but then threw it back up. Cheerios and honey nut cheerios seems to be the only thing that the patient will eat. Will research to see if there are additional recommendations to help with dietary nutrition and protein for the patient. Will send the  recipe to the patients wife by secure text messaging. 02-16-2020: The patient is eating some. The patient seems to be sleeping more. Usually a solid 12 hours. Usually he wants to go to bed by 7:30 pm but the patients wife tries to keep him up a little longer so that he does not go to bed so early.  [place] with appropriate clinical care team members regarding patient needs.  CCM team is involved in the care of the patient. The LCSW has outreach scheduled next week to call and work with the patient and patients wife. The wife is appreciative of any help and resources she can get.   . Patient Self Care Activities related to HLD, CKD Stage 3, Pulmonary Disease, hypothyroidism and Lewy Body Dementia:  . Patient is unable to independently self-manage chronic health conditions  Please see past updates related to this goal by clicking on the "Past Updates" button in the selected goal      .  RNCM: Pt's wife:"He had a fall on Saturday" (pt-stated)        [place] (see longitudinal plan of care for additional care plan information)  Current  Barriers:  [name] Knowledge Deficits related to fall precautions in patient with Lewy Body Dementia and other Chronic Conditions . Decreased adherence to prescribed treatment for fall prevention . Care Coordination needs related to fall prevention support systems and resources in a patient with Lewy Body Dementia . Lacks caregiver support. Wife is primary caregiver. Patients condition is declining. Needs additional assistance in the home.  . Cognitive Deficits  Clinical Goal(s):  [name] Over the next 120 days, patient will demonstrate improved adherence to prescribed treatment plan for decreasing falls as evidenced by patient reporting and review of EMR . Over the next 120 days, patient will verbalize using fall risk reduction strategies discussed . Over the next 120 days, patient will not experience additional falls . Over the next 120 days, patient will  verbalize understanding of plan for having safety devices in place to help with safety in the patient with frequent falls and balance issues . Over the next 120 days, patient will work with pcp, RNCM, CCM team and in home PT/OT to address needs related to Lewy body dementia with frequent falls and decline in condition . Over the next 120 days, patient will attend all scheduled medical appointments: next appointment with the pcp on 01-31-2020 . Over the next 120 days, patient will work with care guides  (community agency) to get resources for support for dementia patients and safety devices  Interventions:  . Provided written and verbal education re: Potential causes of falls and Fall prevention strategies . Reviewed medications and discussed potential side effects of medications such as dizziness and frequent urination . Assessed for s/s of orthostatic hypotension.  Falls are related to changes in condition and decline from Lewy Body Dementia.  The patient has not issues related to blood pressure at this time. The patients wife is a Marine scientist and is aware of orthostatic hypotension.  . Assessed for falls since last encounter. Per the wife the patient fell on Saturday when she had went out briefly to get things they needed at [facility]. When she got back he had went to the kitchen to get cheerios and fell. She had to call her son in law to come and help get him up.  He did not know who his son in law was. 02-16-2020: The patient has not had any falls but he will be ambulating and just sit down wherever.  Sometimes his wife can not get him up and she has to call for someone to come and help her. He is not wandering at night because  he has to have someone to help him get up. She has transitioned him to a single twin bed and so far he is doing well with this. Discussed using a gait belt to help with stability. The patients wife states she has a gait belt and will start using this.  . Assessed patients  knowledge of fall risk prevention secondary to previously provided education. 02-16-2020: the patients wife is very knowledgeable about fall risk and safety in the home. She has a paid caregiver that stays with her Monday through Wednesday. She is also going to have some additional help in the home for bathing.  . Assessed working status of life alert bracelet and patient adherence. Advised the patients wife to call [facility] and ask about the [place] life alert system. It should be a free service to the patient if this was a part of their benefit package.  The wife will do this and see if they can get  this service. She does have a blink camera that she uses when she is in the other room or out briefly to check in on the patient.  . Provided patient information for fall alert systems.  Provided information for the [place] life alert system through [facility] and ask the patients wife to call and inquire about the system as a benefit through [facility].  . Evaluation of current treatment plan related to falls and patient's adherence to plan as established by provider. . Provided education to patient re: getting the book the 36 hour day. The patients wife has purchased this on the recommendation of the PT personnel and just received it this week.  Also evaluated and educated the wife on care giver stress and needing to take care of self as she is the primary caregiver of the patient. She does have someone who is now coming in (Started this week) who is there Monday through Thursday and this has been a big help . Discussed plans with patient for ongoing care management follow up and provided patient with direct contact information for care management team . Reviewed scheduled/upcoming provider appointments including: Sees the specialist in 2 weeks for evaluation.  . Care Guide referral for community resources to help with caregiver support for patients with dementia and dementia resources.   Patient Self Care Activities:   . Utilize walker (assistive device) appropriately with all ambulation.  Per the wife the patient is using his walker more; however she sees a big decline in his coordination and balance. The patient also comes in the room with his shoes on the wrong feet. He does get up some now at night. She is going to call the neurologist to get an increase in his medication to help with rest.  . De-clutter walkways . Change positions slowly . Wear secure fitting shoes at all times with ambulation . Utilize home lighting for dim lit areas . Have self and pet awareness at all times  Plan: . CCM RN CM will follow up in 02-16-2020 at 2:30 pm.  The patients wife has the [facility] contact information to call sooner if needed.   Please see past updates related to this goal by clicking on the "Past Updates" button in the selected goal          Plan:   Telephone follow up appointment with care management team member scheduled for: 03-22-2020 at 3:30 pm   Noreene Larsson RN, MSN, [place] Mobile: [phone]

## 2020-02-21 DIAGNOSIS — Z9181 History of falling: Secondary | ICD-10-CM | POA: Diagnosis not present

## 2020-02-21 DIAGNOSIS — R1312 Dysphagia, oropharyngeal phase: Secondary | ICD-10-CM | POA: Diagnosis not present

## 2020-02-21 DIAGNOSIS — E785 Hyperlipidemia, unspecified: Secondary | ICD-10-CM | POA: Diagnosis not present

## 2020-02-21 DIAGNOSIS — M170 Bilateral primary osteoarthritis of knee: Secondary | ICD-10-CM | POA: Diagnosis not present

## 2020-02-21 DIAGNOSIS — Z87891 Personal history of nicotine dependence: Secondary | ICD-10-CM | POA: Diagnosis not present

## 2020-02-21 DIAGNOSIS — K5790 Diverticulosis of intestine, part unspecified, without perforation or abscess without bleeding: Secondary | ICD-10-CM | POA: Diagnosis not present

## 2020-02-21 DIAGNOSIS — E440 Moderate protein-calorie malnutrition: Secondary | ICD-10-CM | POA: Diagnosis not present

## 2020-02-21 DIAGNOSIS — E039 Hypothyroidism, unspecified: Secondary | ICD-10-CM | POA: Diagnosis not present

## 2020-02-21 DIAGNOSIS — Z7982 Long term (current) use of aspirin: Secondary | ICD-10-CM | POA: Diagnosis not present

## 2020-02-21 DIAGNOSIS — J45909 Unspecified asthma, uncomplicated: Secondary | ICD-10-CM | POA: Diagnosis not present

## 2020-02-21 DIAGNOSIS — M810 Age-related osteoporosis without current pathological fracture: Secondary | ICD-10-CM | POA: Diagnosis not present

## 2020-02-21 DIAGNOSIS — J310 Chronic rhinitis: Secondary | ICD-10-CM | POA: Diagnosis not present

## 2020-02-21 DIAGNOSIS — H9190 Unspecified hearing loss, unspecified ear: Secondary | ICD-10-CM | POA: Diagnosis not present

## 2020-02-21 DIAGNOSIS — Z8673 Personal history of transient ischemic attack (TIA), and cerebral infarction without residual deficits: Secondary | ICD-10-CM | POA: Diagnosis not present

## 2020-02-21 DIAGNOSIS — Z79899 Other long term (current) drug therapy: Secondary | ICD-10-CM | POA: Diagnosis not present

## 2020-02-21 DIAGNOSIS — M160 Bilateral primary osteoarthritis of hip: Secondary | ICD-10-CM | POA: Diagnosis not present

## 2020-02-23 DIAGNOSIS — J45909 Unspecified asthma, uncomplicated: Secondary | ICD-10-CM | POA: Diagnosis not present

## 2020-02-23 DIAGNOSIS — E039 Hypothyroidism, unspecified: Secondary | ICD-10-CM | POA: Diagnosis not present

## 2020-02-23 DIAGNOSIS — K5790 Diverticulosis of intestine, part unspecified, without perforation or abscess without bleeding: Secondary | ICD-10-CM | POA: Diagnosis not present

## 2020-02-23 DIAGNOSIS — R1312 Dysphagia, oropharyngeal phase: Secondary | ICD-10-CM | POA: Diagnosis not present

## 2020-02-23 DIAGNOSIS — J310 Chronic rhinitis: Secondary | ICD-10-CM | POA: Diagnosis not present

## 2020-02-23 DIAGNOSIS — M810 Age-related osteoporosis without current pathological fracture: Secondary | ICD-10-CM | POA: Diagnosis not present

## 2020-02-23 DIAGNOSIS — Z87891 Personal history of nicotine dependence: Secondary | ICD-10-CM | POA: Diagnosis not present

## 2020-02-23 DIAGNOSIS — Z7982 Long term (current) use of aspirin: Secondary | ICD-10-CM | POA: Diagnosis not present

## 2020-02-23 DIAGNOSIS — Z79899 Other long term (current) drug therapy: Secondary | ICD-10-CM | POA: Diagnosis not present

## 2020-02-23 DIAGNOSIS — M160 Bilateral primary osteoarthritis of hip: Secondary | ICD-10-CM | POA: Diagnosis not present

## 2020-02-23 DIAGNOSIS — E785 Hyperlipidemia, unspecified: Secondary | ICD-10-CM | POA: Diagnosis not present

## 2020-02-23 DIAGNOSIS — Z8673 Personal history of transient ischemic attack (TIA), and cerebral infarction without residual deficits: Secondary | ICD-10-CM | POA: Diagnosis not present

## 2020-02-23 DIAGNOSIS — E440 Moderate protein-calorie malnutrition: Secondary | ICD-10-CM | POA: Diagnosis not present

## 2020-02-23 DIAGNOSIS — M170 Bilateral primary osteoarthritis of knee: Secondary | ICD-10-CM | POA: Diagnosis not present

## 2020-02-23 DIAGNOSIS — Z9181 History of falling: Secondary | ICD-10-CM | POA: Diagnosis not present

## 2020-02-23 DIAGNOSIS — H9190 Unspecified hearing loss, unspecified ear: Secondary | ICD-10-CM | POA: Diagnosis not present

## 2020-03-06 ENCOUNTER — Other Ambulatory Visit: Payer: Self-pay

## 2020-03-06 ENCOUNTER — Ambulatory Visit: Payer: Medicare Other | Admitting: Neurology

## 2020-03-06 ENCOUNTER — Encounter: Payer: Self-pay | Admitting: Neurology

## 2020-03-06 VITALS — BP 138/76 | HR 76 | Ht 70.0 in | Wt 130.2 lb

## 2020-03-06 DIAGNOSIS — G3183 Dementia with Lewy bodies: Secondary | ICD-10-CM

## 2020-03-06 DIAGNOSIS — F02818 Dementia in other diseases classified elsewhere, unspecified severity, with other behavioral disturbance: Secondary | ICD-10-CM

## 2020-03-06 DIAGNOSIS — F0281 Dementia in other diseases classified elsewhere with behavioral disturbance: Secondary | ICD-10-CM

## 2020-03-06 MED ORDER — DONEPEZIL HCL 10 MG PO TABS
ORAL_TABLET | ORAL | 3 refills | Status: DC
Start: 2020-03-06 — End: 2020-06-04

## 2020-03-06 MED ORDER — MIRTAZAPINE 30 MG PO TABS
30.0000 mg | ORAL_TABLET | Freq: Every day | ORAL | 3 refills | Status: DC
Start: 2020-03-06 — End: 2020-04-10

## 2020-03-06 MED ORDER — CARBIDOPA-LEVODOPA 25-100 MG PO TABS: ORAL_TABLET | ORAL | 6 refills | Status: DC

## 2020-03-06 NOTE — Patient Instructions (Addendum)
1. Try melatonin at night  2. Continue mirtazapine 30mg  at bedtime and Donepezil 10mg  daily  3. Try Sinemet 1/2 tab three times a day with meals  4. Continue supportive care  5. Follow-up in 4-5 months, call for any changes
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
## 2020-03-08 ENCOUNTER — Ambulatory Visit: Payer: Medicare Other | Admitting: Licensed Clinical Social Worker

## 2020-03-08 DIAGNOSIS — G3183 Dementia with Lewy bodies: Secondary | ICD-10-CM

## 2020-03-08 DIAGNOSIS — E034 Atrophy of thyroid (acquired): Secondary | ICD-10-CM

## 2020-03-08 DIAGNOSIS — J45909 Unspecified asthma, uncomplicated: Secondary | ICD-10-CM

## 2020-03-08 DIAGNOSIS — N1832 Chronic kidney disease, stage 3b: Secondary | ICD-10-CM

## 2020-03-08 DIAGNOSIS — E782 Mixed hyperlipidemia: Secondary | ICD-10-CM | POA: Diagnosis not present

## 2020-03-08 DIAGNOSIS — F0280 Dementia in other diseases classified elsewhere without behavioral disturbance: Secondary | ICD-10-CM

## 2020-03-08 NOTE — Chronic Care Management (AMB) (Signed)
Chronic Care Management    Clinical Social Work Follow Up Note  03/08/2020 Name: Blake Bates MRN: [id number] DOB: 1940-08-18  Blake Bates is a 79 y.o. year old male who is a primary care patient of [name], DO. The CCM team was consulted for assistance with Level of Care Concerns.   Review of patient status, including review of consultants reports, other relevant assessments, and collaboration with appropriate care team members and the patient's provider was performed as part of comprehensive patient evaluation and provision of chronic care management services.    SDOH (Social Determinants of Health) assessments performed: Yes    Outpatient Encounter Medications as of 03/08/2020  Medication Sig  . Acetaminophen (TYLENOL ARTHRITIS PAIN PO) Take by mouth. Once a day  . albuterol (PROVENTIL HFA;VENTOLIN HFA) 108 (90 BASE) MCG/ACT inhaler Inhale into the lungs every 6 (six) hours as needed for wheezing or shortness of breath.  . carbidopa-levodopa (SINEMET IR) 25-100 MG tablet Take 1/2 tablet three times a day with meals  . Cholecalciferol (VITAMIN D-3 PO) Take 5,000 Units/oz/day by mouth daily. PM   . Cyanocobalamin (VITAMIN B-12) 5000 MCG SUBL Place under the tongue daily.  [name] donepezil (ARICEPT) 10 MG tablet Take 1 tablet daily  . levothyroxine (SYNTHROID) 50 MCG tablet TAKE 1 TABLET(50 MCG) BY MOUTH DAILY BEFORE BREAKFAST  . loratadine (CLARITIN) 10 MG tablet Take 10 mg by mouth daily as needed for allergies. PM  . meloxicam (MOBIC) 15 MG tablet TAKE 1 TABLET BY MOUTH DAILY AS NEEDED FOR PAIN AND ARTHRITIS FLARE, TAKE UP TO 1 TO 2 WEEKS AT A TIME  . mirtazapine (REMERON) 30 MG tablet Take 1 tablet (30 mg total) by mouth at bedtime.  [name] neomycin-polymyxin-pramoxine (NEOSPORIN PLUS) 1 % cream Apply topically 2 (two) times daily.   No facility-administered encounter medications on file as of 03/08/2020.     Goals Addressed    .  SW- "We need more in home  support for [name]." (pt-stated)        CARE PLAN ENTRY (see longitudinal plan of care for additional care plan information)  Current Barriers:  . Financial constraints related to affording a private caregiver . Limited social support . Level of care concerns . ADL IADL limitations . Social Isolation . Limited access to caregiver . Inability to perform ADL's independently . Inability to perform IADL's independently . Lacks knowledge of community resource: available support groups and senior resources within their area  Clinical Social Work Clinical Goal(s):  [name] Over the next 120 days, patient/caregiver will work with SW, RNCM and C3 Guide to address concerns related to care coordination needs and lack of education/support/resource connection. LCSW will assist patient in gaining community resource education and additional support and resource connection as well in order to maintain health and mental health appropriately  . Over the next 120 days, patient will demonstrate improved adherence to self care as evidenced by implementing healthy self-care into her daily routine such as: attending all medical appointments, deep breathing exercises, taking time for self-reflection, taking medications as prescribed, participate in PT/OT, drinking water and daily exercise to improve mobility and mood.  . Over the next 120 days, patient will demonstrate improved health management independence as evidenced by implementing healthy self-care skills and positive support/resources into her daily routine to help cope with stressors and improve overall health and well-being  . Over the next 120 days, patient or caregiver will verbalize basic understanding of depression/stress process and self health management plan  as evidenced by her participation in development of long term plan of care and institution of self health management strategies  Interventions: . Inter-disciplinary care team collaboration (see  longitudinal plan of care) . Patient interviewed and appropriate assessments performed . Referred patient to community resources care guide team for assistance with in home support. CCM RNCM has already placed C3 referral. LCSW will not make duplicate referral but will send update to C3 team. [place], PCP and Care Guide completed 3 way call and discussed case. Care Guide was in need of two specific codes in order to get personal care service approval. Care Guide was needing help with finding out what the Medicare procedure code for an in home health aide is so that we can make a prior authorization request through his insurance. PCP completed order for [facility] orders as well. Care Guide has worked hard on helping family gain additional care within the home and will keep CCM team updated for any future needs regarding this referral. UPDATE 03/08/20-Care Guide was able to connect patient with [facility] In Home Aide through [employer]. Spouse reports that the aide Lorriane Shire started her visits last week and will be coming 2x per week to provide care. Spouse shares that [employer] will make another order for CNA once services end. Spouse reports that her  . Provided mental health counseling with regard to caregiver burnout prevention. Caregiver was encouraged to start attending support groups within her area to gain emotional support and additional personal care resource education connections.  . Provided patient's caregiver with information about C.H.O.R.E program,  Elder Care, Medicaid eligibility and Day Programs. Family reports that they would not qualify for Medicaid. They are interested in gaining in home support but are unable to pay for these services privately. Family is interested in C.H.O.R.E program.  . Discussed plans with patient for ongoing care management follow up and provided patient with direct contact information for care management team . Advised  patient's caregiver to check email for dementia support groups, senior center information and [place] that was emailed securely to spouse on 01/12/20. Resources successfully received per spouse.  . Assisted patient/caregiver with obtaining information about health plan benefits . Provided education and assistance to client regarding Advanced Directives. . Provided education to patient/caregiver regarding level of care options. . Provided education to patient/caregiver about Hospice and/or Palliative Care services . Spouse reports having a friend Langley Gauss come by to help her with care giving which has been helpful. Spouse shares that Langley Gauss is "somewhat" living with family now.  [name] Spouse is able to take her first vacation in over 2 years to visit her daughter in law, granddaughter and cousin in [place]. Spouse reports that she would not have been able to travel without the help of [facility] staff, with their resource connection and support.  . Patient attended neurologist appointment on 03/06/20 and family was informed that his condition has stayed the same (moderate dementia)  Patient Self Care Activities:  . Attends all scheduled provider appointments . Calls provider office for new concerns or questions . Lacks social connections . Unable to perform ADLs independently . Unable to perform IADLs independently  Please see past updates related to this goal by clicking on the "Past Updates" button in the selected goal       Follow Up Plan: SW will follow up with patient by phone over the next quarter  Eula Fried, [place], MSW, [facility]  [email or web address] Phone: [phone]

## 2020-03-20 ENCOUNTER — Encounter: Payer: Self-pay | Admitting: Family Medicine

## 2020-03-22 ENCOUNTER — Ambulatory Visit: Payer: Self-pay | Admitting: General Practice

## 2020-03-22 ENCOUNTER — Telehealth: Payer: Self-pay

## 2020-03-22 DIAGNOSIS — E034 Atrophy of thyroid (acquired): Secondary | ICD-10-CM

## 2020-03-22 DIAGNOSIS — F0280 Dementia in other diseases classified elsewhere without behavioral disturbance: Secondary | ICD-10-CM

## 2020-03-22 DIAGNOSIS — E782 Mixed hyperlipidemia: Secondary | ICD-10-CM

## 2020-03-22 DIAGNOSIS — J45909 Unspecified asthma, uncomplicated: Secondary | ICD-10-CM

## 2020-03-22 DIAGNOSIS — N1832 Chronic kidney disease, stage 3b: Secondary | ICD-10-CM

## 2020-03-22 NOTE — Patient Instructions (Signed)
Visit Information  Goals Addressed              This Visit's Progress     RNCM-Pt's wife:"I am having a hard time getting him to eat" (pt-stated)        CARE PLAN ENTRY (see longtitudinal plan of care for additional care plan information)  Current Barriers:   Chronic Disease Management support, education, and care coordination needs related to HLD, CKD Stage 3, Pulmonary Disease, hypothyroidism and Lewy Body Dementia   Clinical Goal(s) related to HLD, CKD Stage 3, Pulmonary Disease, hypothyroidism and Lewy Body Dementia:  Over the next 120 days, patient will:   Work with the care management team to address educational, disease management, and care coordination needs   Begin or continue self health monitoring activities as directed today Measure and record blood pressure 2/3 times per week and encouraging meals to help with nutritional needs and decline in weight loss  Call provider office for new or worsened signs and symptoms Blood pressure findings outside established parameters, Chest pain, Shortness of breath, and New or worsened symptom related to hypothyroidism, worsening dementia and other chronic diseases  Call care management team with questions or concerns  Verbalize basic understanding of patient centered plan of care established today   Interventions related to HLD, CKD Stage 3, Pulmonary Disease, hypothyroidism and Lewy Body Dementia:   Evaluation of current treatment plans and patient's adherence to plan as established by provider.  The patients wife is compliant with the current plan of care but states the patient has had a rapid decline in his sx/sx.  CCM support in addition to pcp recommendations in place. The patient has PT/OT coming in the home currently. The patient with increased safety concerns. Ideas and recommendations provided today. See falls care plan. 02-16-2020: the patients wife has worked with the care guide and they have gotten additional approval for  Anne Ng, with holistic care to come in and work with the patient. The patients wife is happy about this additional help. 03-22-2020: The patients wife states that the holistic care program is working well for them. The patient has help for 6 hours a week. It is really been helpful for the patient and the spouse.   Assessed patient understanding of disease states.  The patient does not have the ability to comprehend what is taking place with his health and well being. His wife [name] is his primary care giver and understands his condition. Is thankful for CCM support and working with pcp to help with the complex nature of the patients chronic conditions.   Assessed patient's education and care coordination needs.  Care guide referral placed for additional resources and help for the patient with advance dementia. 03-22-2020: Review of caregiver burn out.  The patients wife states she is doing okay. She is thankful for the help and support she is getting. Her children are there when they can be and she sometimes has to call them for help with getting the patient up. The patient is steadily declining in function and memory. The patients wife verbalized she was getting her needs meet.   Provided disease specific education to patient.  Review of dietary intake. The patient does not have a good appetite and has had wt loss of 7 pounds in one month. The patient does not like Ensure products but did discuss with the patients wife a milkshake type recipe that has protein in it. The wife is willing to try this to see if it will  help the patient. There has been episodes when the patient ate something different and thought he was doing well with it but then threw it back up. Cheerios and honey nut cheerios seems to be the only thing that the patient will eat. Will research to see if there are additional recommendations to help with dietary nutrition and protein for the patient. Will send the recipe to the patients wife by  secure text messaging. 02-16-2020: The patient is eating some. The patient seems to be sleeping more. Usually a solid 12 hours. Usually he wants to go to bed by 7:30 pm but the patients wife tries to keep him up a little longer so that he does not go to bed so early. 03-22-2020: the patient is still eating cheerios and drinking supplemental drinks. The patients wife states he is stable.   Collaborated with appropriate clinical care team members regarding patient needs.  CCM team is involved in the care of the patient. The LCSW has outreach scheduled next week to call and work with the patient and patients wife. The wife is appreciative of any help and resources she can get.    Patient Self Care Activities related to HLD, CKD Stage 3, Pulmonary Disease, hypothyroidism and Lewy Body Dementia:   Patient is unable to independently self-manage chronic health conditions  Please see past updates related to this goal by clicking on the "Past Updates" button in the selected goal        RNCM: Pt's wife:"He had a fall on Saturday" (pt-stated)        [place] (see longitudinal plan of care for additional care plan information)  Current Barriers:   Knowledge Deficits related to fall precautions in patient with Lewy Body Dementia and other Chronic Conditions  Decreased adherence to prescribed treatment for fall prevention  Care Coordination needs related to fall prevention support systems and resources in a patient with Lewy Body Dementia  Lacks caregiver support. Wife is primary caregiver. Patients condition is declining. Needs additional assistance in the home.   Cognitive Deficits  Clinical Goal(s):   Over the next 120 days, patient will demonstrate improved adherence to prescribed treatment plan for decreasing falls as evidenced by patient reporting and review of EMR  Over the next 120 days, patient will verbalize using fall risk reduction strategies discussed  Over the next 120 days,  patient will not experience additional falls  Over the next 120 days, patient will verbalize understanding of plan for having safety devices in place to help with safety in the patient with frequent falls and balance issues  Over the next 120 days, patient will work with pcp, RNCM, CCM team and in home PT/OT to address needs related to Lewy body dementia with frequent falls and decline in condition  Over the next 120 days, patient will attend all scheduled medical appointments: next appointment with the pcp on 01-31-2020  Over the next 120 days, patient will work with care guides  (community agency) to get resources for support for dementia patients and safety devices  Interventions:   Provided written and verbal education re: Potential causes of falls and Fall prevention strategies  Reviewed medications and discussed potential side effects of medications such as dizziness and frequent urination  Assessed for s/s of orthostatic hypotension.  Falls are related to changes in condition and decline from Lewy Body Dementia.  The patient has not issues related to blood pressure at this time. The patients wife is a Marine scientist and is aware of  orthostatic hypotension.   Assessed for falls since last encounter. Per the wife the patient fell on Saturday when she had went out briefly to get things they needed at [facility]. When she got back he had went to the kitchen to get cheerios and fell. She had to call her son in law to come and help get him up.  He did not know who his son in law was. 02-16-2020: The patient has not had any falls but he will be ambulating and just sit down wherever.  Sometimes his wife can not get him up and she has to call for someone to come and help her. He is not wandering at night because  he has to have someone to help him get up. She has transitioned him to a single twin bed and so far he is doing well with this. Discussed using a gait belt to help with stability. The patients wife  states she has a gait belt and will start using this. 03-22-2020: States mobility is worse but thankfully the patient has had no new falls.   Assessed patients knowledge of fall risk prevention secondary to previously provided education. 02-16-2020: the patients wife is very knowledgeable about fall risk and safety in the home. She has a paid caregiver that stays with her Monday through Wednesday. She is also going to have some additional help in the home for bathing. 03-22-2020: The patient is doing well and no new falls. She has help in the home and it is working well.   Assessed working status of life alert bracelet and patient adherence. Advised the patients wife to call [facility] and ask about the [place] life alert system. It should be a free service to the patient if this was a part of their benefit package.  The wife will do this and see if they can get this service. She does have a blink camera that she uses when she is in the other room or out briefly to check in on the patient.   Provided patient information for fall alert systems.  Provided information for the [place] life alert system through [facility] and ask the patients wife to call and inquire about the system as a benefit through [facility].   Evaluation of current treatment plan related to falls and patient's adherence to plan as established by provider.  Provided education to patient re: getting the book the 36 hour day. The patients wife has purchased this on the recommendation of the PT personnel and just received it this week.  Also evaluated and educated the wife on care giver stress and needing to take care of self as she is the primary caregiver of the patient. She does have someone who is now coming in (Started this week) who is there Monday through Thursday and this has been a big help  Discussed plans with patient for ongoing care management follow up and provided patient with direct contact information for care management team  Reviewed  scheduled/upcoming provider appointments including: Sees the specialist in 2 weeks for evaluation. Next appointment with pcp is 05-01-2020  Care Guide referral for community resources to help with caregiver support for patients with dementia and dementia resources.   Patient Self Care Activities:   Utilize walker (assistive device) appropriately with all ambulation.  Per the wife the patient is using his walker more; however she sees a big decline in his coordination and balance. The patient also comes in the room with his shoes on the wrong feet.  He does get up some now at night. She is going to call the neurologist to get an increase in his medication to help with rest.   De-clutter walkways  Change positions slowly  Wear secure fitting shoes at all times with ambulation  Utilize home lighting for dim lit areas  Have self and pet awareness at all times  Plan:  CCM RN CM will follow up in 02-16-2020 at 2:30 pm.  The patients wife has the [facility] contact information to call sooner if needed.   Please see past updates related to this goal by clicking on the "Past Updates" button in the selected goal         Patient verbalizes understanding of instructions provided today.   Telephone follow up appointment with care management team member scheduled for: 05-17-2020 at 3:15 pm  [place], MSN, [place] Mobile: [phone]

## 2020-03-22 NOTE — Chronic Care Management (AMB) (Signed)
Chronic Care Management   Follow Up Note   03/22/2020 Name: Blake Bates MRN: [id number] DOB: Feb 16, 1941  Referred by: [name], DO Reason for referral : Chronic Care Management (RNCM Follow up call for Chronic Disease Management and Care Coordination Needs)   Blake Bates is a 79 y.o. year old male who is a primary care patient of [name], DO. The CCM team was consulted for assistance with chronic disease management and care coordination needs.    Review of patient status, including review of consultants reports, relevant laboratory and other test results, and collaboration with appropriate care team members and the patient's provider was performed as part of comprehensive patient evaluation and provision of chronic care management services.    SDOH (Social Determinants of Health) assessments performed: Yes See Care Plan activities for detailed interventions related to [facility])     Outpatient Encounter Medications as of 03/22/2020  Medication Sig  . Acetaminophen (TYLENOL ARTHRITIS PAIN PO) Take by mouth. Once a day  . albuterol (PROVENTIL HFA;VENTOLIN HFA) 108 (90 BASE) MCG/ACT inhaler Inhale into the lungs every 6 (six) hours as needed for wheezing or shortness of breath.  . carbidopa-levodopa (SINEMET IR) 25-100 MG tablet Take 1/2 tablet three times a day with meals  . Cholecalciferol (VITAMIN D-3 PO) Take 5,000 Units/oz/day by mouth daily. PM   . Cyanocobalamin (VITAMIN B-12) 5000 MCG SUBL Place under the tongue daily.  [name] donepezil (ARICEPT) 10 MG tablet Take 1 tablet daily  . levothyroxine (SYNTHROID) 50 MCG tablet TAKE 1 TABLET(50 MCG) BY MOUTH DAILY BEFORE BREAKFAST  . loratadine (CLARITIN) 10 MG tablet Take 10 mg by mouth daily as needed for allergies. PM  . meloxicam (MOBIC) 15 MG tablet TAKE 1 TABLET BY MOUTH DAILY AS NEEDED FOR PAIN AND ARTHRITIS FLARE, TAKE UP TO 1 TO 2 WEEKS AT A TIME  . mirtazapine (REMERON) 30 MG tablet Take 1  tablet (30 mg total) by mouth at bedtime.  [name] neomycin-polymyxin-pramoxine (NEOSPORIN PLUS) 1 % cream Apply topically 2 (two) times daily.   No facility-administered encounter medications on file as of 03/22/2020.     Objective:  BP Readings from Last 3 Encounters:  03/06/20 138/76  01/31/20 120/70  12/27/19 (!) 120/55    Goals Addressed              This Visit's Progress   .  RNCM-Pt's wife:"I am having a hard time getting him to eat" (pt-stated)        CARE PLAN ENTRY (see longtitudinal plan of care for additional care plan information)  Current Barriers:  . Chronic Disease Management support, education, and care coordination needs related to HLD, CKD Stage 3, Pulmonary Disease, hypothyroidism and Lewy Body Dementia  . Clinical Goal(s) related to HLD, CKD Stage 3, Pulmonary Disease, hypothyroidism and Lewy Body Dementia:  Over the next 120 days, patient will:  . Work with the care management team to address educational, disease management, and care coordination needs  . Begin or continue self health monitoring activities as directed today Measure and record blood pressure 2/3 times per week and encouraging meals to help with nutritional needs and decline in weight loss . Call provider office for new or worsened signs and symptoms Blood pressure findings outside established parameters, Chest pain, Shortness of breath, and New or worsened symptom related to hypothyroidism, worsening dementia and other chronic diseases . Call care management team with questions or concerns . Verbalize basic understanding of patient centered plan of care  established today  . Interventions related to HLD, CKD Stage 3, Pulmonary Disease, hypothyroidism and Lewy Body Dementia:  . Evaluation of current treatment plans and patient's adherence to plan as established by provider.  The patients wife is compliant with the current plan of care but states the patient has had a rapid decline in his sx/sx.  CCM  support in addition to pcp recommendations in place. The patient has PT/OT coming in the home currently. The patient with increased safety concerns. Ideas and recommendations provided today. See falls care plan. 02-16-2020: the patients wife has worked with the care guide and they have gotten additional approval for Anne Ng, with holistic care to come in and work with the patient. The patients wife is happy about this additional help. 03-22-2020: The patients wife states that the holistic care program is working well for them. The patient has help for 6 hours a week. It is really been helpful for the patient and the spouse.  . Assessed patient understanding of disease states.  The patient does not have the ability to comprehend what is taking place with his health and well being. His wife [name] is his primary care giver and understands his condition. Is thankful for CCM support and working with pcp to help with the complex nature of the patients chronic conditions.  . Assessed patient's education and care coordination needs.  Care guide referral placed for additional resources and help for the patient with advance dementia. 03-22-2020: Review of caregiver burn out.  The patients wife states she is doing okay. She is thankful for the help and support she is getting. Her children are there when they can be and she sometimes has to call them for help with getting the patient up. The patient is steadily declining in function and memory. The patients wife verbalized she was getting her needs meet.  . Provided disease specific education to patient.  Review of dietary intake. The patient does not have a good appetite and has had wt loss of 7 pounds in one month. The patient does not like Ensure products but did discuss with the patients wife a milkshake type recipe that has protein in it. The wife is willing to try this to see if it will help the patient. There has been episodes when the patient ate something different  and thought he was doing well with it but then threw it back up. Cheerios and honey nut cheerios seems to be the only thing that the patient will eat. Will research to see if there are additional recommendations to help with dietary nutrition and protein for the patient. Will send the recipe to the patients wife by secure text messaging. 02-16-2020: The patient is eating some. The patient seems to be sleeping more. Usually a solid 12 hours. Usually he wants to go to bed by 7:30 pm but the patients wife tries to keep him up a little longer so that he does not go to bed so early. 03-22-2020: the patient is still eating cheerios and drinking supplemental drinks. The patients wife states he is stable.  Nash Dimmer with appropriate clinical care team members regarding patient needs.  CCM team is involved in the care of the patient. The LCSW has outreach scheduled next week to call and work with the patient and patients wife. The wife is appreciative of any help and resources she can get.   . Patient Self Care Activities related to HLD, CKD Stage 3, Pulmonary Disease, hypothyroidism and Lewy Body Dementia:  .  Patient is unable to independently self-manage chronic health conditions  Please see past updates related to this goal by clicking on the "Past Updates" button in the selected goal      .  RNCM: Pt's wife:"He had a fall on Saturday" (pt-stated)        [place] (see longitudinal plan of care for additional care plan information)  Current Barriers:  [name] Knowledge Deficits related to fall precautions in patient with Lewy Body Dementia and other Chronic Conditions . Decreased adherence to prescribed treatment for fall prevention . Care Coordination needs related to fall prevention support systems and resources in a patient with Lewy Body Dementia . Lacks caregiver support. Wife is primary caregiver. Patients condition is declining. Needs additional assistance in the home.  . Cognitive  Deficits  Clinical Goal(s):  [name] Over the next 120 days, patient will demonstrate improved adherence to prescribed treatment plan for decreasing falls as evidenced by patient reporting and review of EMR . Over the next 120 days, patient will verbalize using fall risk reduction strategies discussed . Over the next 120 days, patient will not experience additional falls . Over the next 120 days, patient will verbalize understanding of plan for having safety devices in place to help with safety in the patient with frequent falls and balance issues . Over the next 120 days, patient will work with pcp, RNCM, CCM team and in home PT/OT to address needs related to Lewy body dementia with frequent falls and decline in condition . Over the next 120 days, patient will attend all scheduled medical appointments: next appointment with the pcp on 01-31-2020 . Over the next 120 days, patient will work with care guides  (community agency) to get resources for support for dementia patients and safety devices  Interventions:  . Provided written and verbal education re: Potential causes of falls and Fall prevention strategies . Reviewed medications and discussed potential side effects of medications such as dizziness and frequent urination . Assessed for s/s of orthostatic hypotension.  Falls are related to changes in condition and decline from Lewy Body Dementia.  The patient has not issues related to blood pressure at this time. The patients wife is a Marine scientist and is aware of orthostatic hypotension.  . Assessed for falls since last encounter. Per the wife the patient fell on Saturday when she had went out briefly to get things they needed at [facility]. When she got back he had went to the kitchen to get cheerios and fell. She had to call her son in law to come and help get him up.  He did not know who his son in law was. 02-16-2020: The patient has not had any falls but he will be ambulating and just sit down wherever.   Sometimes his wife can not get him up and she has to call for someone to come and help her. He is not wandering at night because  he has to have someone to help him get up. She has transitioned him to a single twin bed and so far he is doing well with this. Discussed using a gait belt to help with stability. The patients wife states she has a gait belt and will start using this. 03-22-2020: States mobility is worse but thankfully the patient has had no new falls.  . Assessed patients knowledge of fall risk prevention secondary to previously provided education. 02-16-2020: the patients wife is very knowledgeable about fall risk and safety in the home. She has  a paid caregiver that stays with her Monday through Wednesday. She is also going to have some additional help in the home for bathing. 03-22-2020: The patient is doing well and no new falls. She has help in the home and it is working well.  . Assessed working status of life alert bracelet and patient adherence. Advised the patients wife to call [facility] and ask about the [place] life alert system. It should be a free service to the patient if this was a part of their benefit package.  The wife will do this and see if they can get this service. She does have a blink camera that she uses when she is in the other room or out briefly to check in on the patient.  . Provided patient information for fall alert systems.  Provided information for the [place] life alert system through [facility] and ask the patients wife to call and inquire about the system as a benefit through [facility].  . Evaluation of current treatment plan related to falls and patient's adherence to plan as established by provider. . Provided education to patient re: getting the book the 36 hour day. The patients wife has purchased this on the recommendation of the PT personnel and just received it this week.  Also evaluated and educated the wife on care giver stress and needing to take care of self as she is  the primary caregiver of the patient. She does have someone who is now coming in (Started this week) who is there Monday through Thursday and this has been a big help . Discussed plans with patient for ongoing care management follow up and provided patient with direct contact information for care management team . Reviewed scheduled/upcoming provider appointments including: Sees the specialist in 2 weeks for evaluation. Next appointment with pcp is 05-01-2020 . Care Guide referral for community resources to help with caregiver support for patients with dementia and dementia resources.   Patient Self Care Activities:  . Utilize walker (assistive device) appropriately with all ambulation.  Per the wife the patient is using his walker more; however she sees a big decline in his coordination and balance. The patient also comes in the room with his shoes on the wrong feet. He does get up some now at night. She is going to call the neurologist to get an increase in his medication to help with rest.  . De-clutter walkways . Change positions slowly . Wear secure fitting shoes at all times with ambulation . Utilize home lighting for dim lit areas . Have self and pet awareness at all times  Plan: . CCM RN CM will follow up in 02-16-2020 at 2:30 pm.  The patients wife has the [facility] contact information to call sooner if needed.   Please see past updates related to this goal by clicking on the "Past Updates" button in the selected goal          Plan:   Telephone follow up appointment with care management team member scheduled for: 05-17-2020 at 3:15 pm   Noreene Larsson RN, MSN, [place] Mobile: [phone]

## 2020-03-26 ENCOUNTER — Ambulatory Visit: Payer: Medicare Other | Admitting: Podiatry

## 2020-04-03 DIAGNOSIS — D3132 Benign neoplasm of left choroid: Secondary | ICD-10-CM | POA: Diagnosis not present

## 2020-04-07 ENCOUNTER — Other Ambulatory Visit: Payer: Self-pay | Admitting: Family Medicine

## 2020-04-07 DIAGNOSIS — E039 Hypothyroidism, unspecified: Secondary | ICD-10-CM

## 2020-04-07 NOTE — Telephone Encounter (Signed)
Requested Prescriptions  Pending Prescriptions Disp Refills  . levothyroxine (SYNTHROID) 50 MCG tablet [Pharmacy Med Name: LEVOTHYROXINE 0.05MG  (50MCG) TAB] 90 tablet 3    Sig: TAKE 1 TABLET(50 MCG) BY MOUTH DAILY BEFORE BREAKFAST     Endocrinology:  Hypothyroid Agents Failed - 04/07/2020  7:07 AM      Failed - TSH needs to be rechecked within 3 months after an abnormal result. Refill until TSH is due.      Passed - TSH in normal range and within 360 days    TSH  Date Value Ref Range Status  12/20/2019 1.27 0.40 - 4.50 mIU/L Final         Passed - Valid encounter within last 12 months    Recent Outpatient Visits          2 months ago Lewy body dementia without behavioral disturbance [facility])   [place], DO   3 months ago Lewy body dementia without behavioral disturbance [facility])   [place], DO   5 months ago Generalized muscle weakness   [place], DO   6 months ago Moderate protein-calorie malnutrition [facility])   [place], DO   9 months ago Lewy body dementia without behavioral disturbance [facility])   [facility] [name], DO      Future Appointments            In 3 weeks [place], Devonne Doughty, DO [facility], [facility]

## 2020-04-10 ENCOUNTER — Other Ambulatory Visit: Payer: Self-pay | Admitting: Neurology

## 2020-04-10 MED ORDER — MIRTAZAPINE 45 MG PO TABS: 45.0000 mg | ORAL_TABLET | Freq: Every day | ORAL | 11 refills | Status: AC

## 2020-04-24 ENCOUNTER — Telehealth: Payer: Self-pay | Admitting: Licensed Clinical Social Worker

## 2020-04-24 ENCOUNTER — Telehealth: Payer: Self-pay

## 2020-04-24 NOTE — Telephone Encounter (Signed)
  Chronic Care Management    Clinical Social Work General Follow Up Note  04/24/2020 Name: Blake Bates MRN: [id number] DOB: Sep 12, 1940  Blake Bates is a 79 y.o. year old male who is a primary care patient of [name], DO. The CCM team was consulted for assistance with Level of Care Concerns.   Review of patient status, including review of consultants reports, relevant laboratory and other test results, and collaboration with appropriate care team members and the patient's provider was performed as part of comprehensive patient evaluation and provision of chronic care management services.    LCSW completed CCM outreach attempt today but was unable to reach patient successfully. A HIPPA compliant voice message was left encouraging patient to return call once available. LCSW will ask Scheduling Care Guide to reschedule CCM SW appointment with patient as well.  Outpatient Encounter Medications as of 04/24/2020  Medication Sig  . Acetaminophen (TYLENOL ARTHRITIS PAIN PO) Take by mouth. Once a day  . albuterol (PROVENTIL HFA;VENTOLIN HFA) 108 (90 BASE) MCG/ACT inhaler Inhale into the lungs every 6 (six) hours as needed for wheezing or shortness of breath.  . carbidopa-levodopa (SINEMET IR) 25-100 MG tablet Take 1/2 tablet three times a day with meals  . Cholecalciferol (VITAMIN D-3 PO) Take 5,000 Units/oz/day by mouth daily. PM   . Cyanocobalamin (VITAMIN B-12) 5000 MCG SUBL Place under the tongue daily.  [name] donepezil (ARICEPT) 10 MG tablet Take 1 tablet daily  . levothyroxine (SYNTHROID) 50 MCG tablet TAKE 1 TABLET(50 MCG) BY MOUTH DAILY BEFORE BREAKFAST  . loratadine (CLARITIN) 10 MG tablet Take 10 mg by mouth daily as needed for allergies. PM  . meloxicam (MOBIC) 15 MG tablet TAKE 1 TABLET BY MOUTH DAILY AS NEEDED FOR PAIN AND ARTHRITIS FLARE, TAKE UP TO 1 TO 2 WEEKS AT A TIME  . mirtazapine (REMERON) 45 MG tablet Take 1 tablet (45 mg total) by mouth at bedtime.  [name]  neomycin-polymyxin-pramoxine (NEOSPORIN PLUS) 1 % cream Apply topically 2 (two) times daily.   No facility-administered encounter medications on file as of 04/24/2020.    Follow Up Plan: Rew will reach out to patient to reschedule appointment.   Eula Fried, BSW, MSW, [email or web address] Phone: [phone]

## 2020-05-01 ENCOUNTER — Ambulatory Visit: Payer: Medicare Other | Admitting: Family Medicine

## 2020-05-01 ENCOUNTER — Other Ambulatory Visit: Payer: Self-pay | Admitting: *Deleted

## 2020-05-01 DIAGNOSIS — N1832 Chronic kidney disease, stage 3b: Secondary | ICD-10-CM

## 2020-05-01 DIAGNOSIS — D649 Anemia, unspecified: Secondary | ICD-10-CM

## 2020-05-01 DIAGNOSIS — F0280 Dementia in other diseases classified elsewhere without behavioral disturbance: Secondary | ICD-10-CM

## 2020-05-01 DIAGNOSIS — E440 Moderate protein-calorie malnutrition: Secondary | ICD-10-CM

## 2020-05-02 ENCOUNTER — Other Ambulatory Visit: Payer: Medicare Other

## 2020-05-17 ENCOUNTER — Ambulatory Visit: Payer: Self-pay | Admitting: General Practice

## 2020-05-17 ENCOUNTER — Telehealth: Payer: Self-pay | Admitting: General Practice

## 2020-05-17 DIAGNOSIS — J45909 Unspecified asthma, uncomplicated: Secondary | ICD-10-CM

## 2020-05-17 DIAGNOSIS — E034 Atrophy of thyroid (acquired): Secondary | ICD-10-CM

## 2020-05-17 DIAGNOSIS — R2689 Other abnormalities of gait and mobility: Secondary | ICD-10-CM

## 2020-05-17 DIAGNOSIS — N1832 Chronic kidney disease, stage 3b: Secondary | ICD-10-CM

## 2020-05-17 DIAGNOSIS — E782 Mixed hyperlipidemia: Secondary | ICD-10-CM

## 2020-05-17 DIAGNOSIS — N183 Chronic kidney disease, stage 3 unspecified: Secondary | ICD-10-CM

## 2020-05-17 DIAGNOSIS — F0280 Dementia in other diseases classified elsewhere without behavioral disturbance: Secondary | ICD-10-CM

## 2020-05-17 DIAGNOSIS — G3183 Dementia with Lewy bodies: Secondary | ICD-10-CM

## 2020-05-17 NOTE — Patient Instructions (Signed)
Visit Information  Goals Addressed              This Visit's Progress   .  RNCM-Pt's wife:"I am having a hard time getting him to eat" (pt-stated)        CARE PLAN ENTRY (see longtitudinal plan of care for additional care plan information)  Current Barriers:  . Chronic Disease Management support, education, and care coordination needs related to HLD, CKD Stage 3, Pulmonary Disease, hypothyroidism and Lewy Body Dementia  . Clinical Goal(s) related to HLD, CKD Stage 3, Pulmonary Disease, hypothyroidism and Lewy Body Dementia:  Over the next 120 days, patient will:  . Work with the care management team to address educational, disease management, and care coordination needs  . Begin or continue self health monitoring activities as directed today Measure and record blood pressure 2/3 times per week and encouraging meals to help with nutritional needs and decline in weight loss . Call provider office for new or worsened signs and symptoms Blood pressure findings outside established parameters, Chest pain, Shortness of breath, and New or worsened symptom related to hypothyroidism, worsening dementia and other chronic diseases . Call care management team with questions or concerns . Verbalize basic understanding of patient centered plan of care established today  . Interventions related to HLD, CKD Stage 3, Pulmonary Disease, hypothyroidism and Lewy Body Dementia:  . Evaluation of current treatment plans and patient's adherence to plan as established by provider.  The patients wife is compliant with the current plan of care but states the patient has had a rapid decline in his sx/sx.  CCM support in addition to pcp recommendations in place. The patient has PT/OT coming in the home currently. The patient with increased safety concerns. Ideas and recommendations provided today. See falls care plan. 02-16-2020: the patients wife has worked with the care guide and they have gotten additional approval for  Drinda Butts, with holistic care to come in and work with the patient. The patients wife is happy about this additional help. 05-17-2020: The patients wife states that the holistic care program is working well for them. The patient has help for 6 hours a week. It is really been helpful for the patient and the spouse. Things are status quo. He is walking with the walker now and doing well.  . Assessed patient understanding of disease states.  The patient does not have the ability to comprehend what is taking place with his health and well being. His wife Bonita Quin is his primary care giver and understands his condition. Is thankful for CCM support and working with pcp to help with the complex nature of the patients chronic conditions.  . Assessed patient's education and care coordination needs.  Care guide referral placed for additional resources and help for the patient with advance dementia. 05-17-2020: Review of caregiver burn out.  The patients wife states she is doing okay. She is thankful for the help and support she is getting. Her children are there when they can be and she sometimes has to call them for help with getting the patient up. The patient is steadily declining in function and memory. The patients wife verbalized she was getting her needs meet. The patient's wife states they are building a new house near her daughter. It is a smaller house and she will be happy to be near her daughter in the event she needs additional help with the patient.  . Provided disease specific education to patient.  Review of dietary intake. The patient  does not have a good appetite and has had wt loss of 7 pounds in one month. The patient does not like Ensure products but did discuss with the patients wife a milkshake type recipe that has protein in it. The wife is willing to try this to see if it will help the patient. There has been episodes when the patient ate something different and thought he was doing well with it but then  threw it back up. Cheerios and honey nut cheerios seems to be the only thing that the patient will eat. Will research to see if there are additional recommendations to help with dietary nutrition and protein for the patient. Will send the recipe to the patients wife by secure text messaging. 02-16-2020: The patient is eating some. The patient seems to be sleeping more. Usually a solid 12 hours. Usually he wants to go to bed by 7:30 pm but the patients wife tries to keep him up a little longer so that he does not go to bed so early. 05-17-2020: the patient is still eating cheerios and drinking supplemental drinks. The patients wife states he is stable.  Nash Dimmer with appropriate clinical care team members regarding patient needs.  CCM team is involved in the care of the patient. The LCSW has outreach scheduled next week to call and work with the patient and patients wife. The wife is appreciative of any help and resources she can get.   . Patient Self Care Activities related to HLD, CKD Stage 3, Pulmonary Disease, hypothyroidism and Lewy Body Dementia:  . Patient is unable to independently self-manage chronic health conditions  Please see past updates related to this goal by clicking on the "Past Updates" button in the selected goal      .  RNCM: Pt's wife:"He had a fall on Saturday" (pt-stated)        [place] (see longitudinal plan of care for additional care plan information)  Current Barriers:  [name] Knowledge Deficits related to fall precautions in patient with Lewy Body Dementia and other Chronic Conditions . Decreased adherence to prescribed treatment for fall prevention . Care Coordination needs related to fall prevention support systems and resources in a patient with Lewy Body Dementia . Lacks caregiver support. Wife is primary caregiver. Patients condition is declining. Needs additional assistance in the home.  . Cognitive Deficits  Clinical Goal(s):  [name] Over the next 120 days,  patient will demonstrate improved adherence to prescribed treatment plan for decreasing falls as evidenced by patient reporting and review of EMR . Over the next 120 days, patient will verbalize using fall risk reduction strategies discussed . Over the next 120 days, patient will not experience additional falls . Over the next 120 days, patient will verbalize understanding of plan for having safety devices in place to help with safety in the patient with frequent falls and balance issues . Over the next 120 days, patient will work with pcp, RNCM, CCM team and in home PT/OT to address needs related to Lewy body dementia with frequent falls and decline in condition . Over the next 120 days, patient will attend all scheduled medical appointments: next appointment with the pcp on 01-31-2020 . Over the next 120 days, patient will work with care guides  (community agency) to get resources for support for dementia patients and safety devices  Interventions:  . Provided written and verbal education re: Potential causes of falls and Fall prevention strategies . Reviewed medications and discussed potential side effects  of medications such as dizziness and frequent urination . Assessed for s/s of orthostatic hypotension.  Falls are related to changes in condition and decline from Lewy Body Dementia.  The patient has not issues related to blood pressure at this time. The patients wife is a Marine scientist and is aware of orthostatic hypotension.  . Assessed for falls since last encounter. Per the wife the patient fell on Saturday when she had went out briefly to get things they needed at [facility]. When she got back he had went to the kitchen to get cheerios and fell. She had to call her son in law to come and help get him up.  He did not know who his son in law was. 02-16-2020: The patient has not had any falls but he will be ambulating and just sit down wherever.  Sometimes his wife can not get him up and she has to call  for someone to come and help her. He is not wandering at night because  he has to have someone to help him get up. She has transitioned him to a single twin bed and so far he is doing well with this. Discussed using a gait belt to help with stability. The patients wife states she has a gait belt and will start using this. 05-17-2020: The patients mobility is improved since last outreach. The patient's wife states that he is walking with the walker and has not had any falls. She is thankful for this improvement in his mobility.  . Assessed patients knowledge of fall risk prevention secondary to previously provided education. 02-16-2020: the patients wife is very knowledgeable about fall risk and safety in the home. She has a paid caregiver that stays with her Monday through Wednesday. She is also going to have some additional help in the home for bathing. 05-17-2020: The patient is doing well and no new falls. She has help in the home and it is working well.  . Assessed working status of life alert bracelet and patient adherence. Advised the patients wife to call [facility] and ask about the [place] life alert system. It should be a free service to the patient if this was a part of their benefit package.  The wife will do this and see if they can get this service. She does have a blink camera that she uses when she is in the other room or out briefly to check in on the patient.  . Provided patient information for fall alert systems.  Provided information for the [place] life alert system through [facility] and ask the patients wife to call and inquire about the system as a benefit through [facility].  . Evaluation of current treatment plan related to falls and patient's adherence to plan as established by provider. . Provided education to patient re: getting the book the 36 hour day. The patients wife has purchased this on the recommendation of the PT personnel and just received it this week.  Also evaluated and educated the wife on  care giver stress and needing to take care of self as she is the primary caregiver of the patient. She does have someone who is now coming in (Started this week) who is there Monday through Thursday and this has been a big help . Discussed plans with patient for ongoing care management follow up and provided patient with direct contact information for care management team . Reviewed scheduled/upcoming provider appointments including: No upcoming appointments with the pcp. Will call for changes or needs  .  Care Guide referral for community resources to help with caregiver support for patients with dementia and dementia resources.   Patient Self Care Activities:  . Utilize walker (assistive device) appropriately with all ambulation.  Per the wife the patient is using his walker more; however she sees a big decline in his coordination and balance. The patient also comes in the room with his shoes on the wrong feet. He does get up some now at night. She is going to call the neurologist to get an increase in his medication to help with rest.  . De-clutter walkways . Change positions slowly . Wear secure fitting shoes at all times with ambulation . Utilize home lighting for dim lit areas . Have self and pet awareness at all times  Plan: . CCM RN CM will follow up on  07-26-2020 at 3:45 pm The patients wife has the [facility] contact information to call sooner if needed.   Please see past updates related to this goal by clicking on the "Past Updates" button in the selected goal         The patient verbalized understanding of instructions, educational materials, and care plan provided today and declined offer to receive copy of patient instructions, educational materials, and care plan.   Telephone follow up appointment with care management team member scheduled for: 07-26-2020 at 3:45 pm  Alto Denver RN, MSN, CCM Community Care Coordinator [facility]  [facility] [place] Mobile: [phone]

## 2020-05-17 NOTE — Chronic Care Management (AMB) (Signed)
Chronic Care Management   Follow Up Note   05/17/2020 Name: Blake Bates MRN: [id number] DOB: [birth year]  Referred by: Blake Hauser, DO Reason for referral : Chronic Care Management (RNCM: Follow up for Chronic Disease Management and Care Coordination Needs)   Blake Bates is a 80 y.o. year old male who is a primary care patient of Blake Hauser, DO. The CCM team was consulted for assistance with chronic disease management and care coordination needs.    Review of patient status, including review of consultants reports, relevant laboratory and other test results, and collaboration with appropriate care team members and the patient's provider was performed as part of comprehensive patient evaluation and provision of chronic care management services.    SDOH (Social Determinants of Health) assessments performed: Yes See Care Plan activities for detailed interventions related to [facility])     Outpatient Encounter Medications as of 05/17/2020  Medication Sig   Acetaminophen (TYLENOL ARTHRITIS PAIN PO) Take by mouth. Once a day   albuterol (PROVENTIL HFA;VENTOLIN HFA) 108 (90 BASE) MCG/ACT inhaler Inhale into the lungs every 6 (six) hours as needed for wheezing or shortness of breath.   carbidopa-levodopa (SINEMET IR) 25-100 MG tablet Take 1/2 tablet three times a day with meals   Cholecalciferol (VITAMIN D-3 PO) Take 5,000 Units/oz/day by mouth daily. PM    Cyanocobalamin (VITAMIN B-12) 5000 MCG SUBL Place under the tongue daily.   donepezil (ARICEPT) 10 MG tablet Take 1 tablet daily   levothyroxine (SYNTHROID) 50 MCG tablet TAKE 1 TABLET(50 MCG) BY MOUTH DAILY BEFORE BREAKFAST   loratadine (CLARITIN) 10 MG tablet Take 10 mg by mouth daily as needed for allergies. PM   meloxicam (MOBIC) 15 MG tablet TAKE 1 TABLET BY MOUTH DAILY AS NEEDED FOR PAIN AND ARTHRITIS FLARE, TAKE UP TO 1 TO 2 WEEKS AT A TIME   mirtazapine (REMERON) 45 MG tablet Take 1 tablet  (45 mg total) by mouth at bedtime.   neomycin-polymyxin-pramoxine (NEOSPORIN PLUS) 1 % cream Apply topically 2 (two) times daily.   No facility-administered encounter medications on file as of 05/17/2020.     Objective:   Goals Addressed              This Visit's Progress     RNCM-Pt's wife:"I am having a hard time getting him to eat" (pt-stated)        CARE PLAN ENTRY (see longtitudinal plan of care for additional care plan information)  Current Barriers:   Chronic Disease Management support, education, and care coordination needs related to HLD, CKD Stage 3, Pulmonary Disease, hypothyroidism and Lewy Body Dementia   Clinical Goal(s) related to HLD, CKD Stage 3, Pulmonary Disease, hypothyroidism and Lewy Body Dementia:  Over the next 120 days, patient will:   Work with the care management team to address educational, disease management, and care coordination needs   Begin or continue self health monitoring activities as directed today Measure and record blood pressure 2/3 times per week and encouraging meals to help with nutritional needs and decline in weight loss  Call provider office for new or worsened signs and symptoms Blood pressure findings outside established parameters, Chest pain, Shortness of breath, and New or worsened symptom related to hypothyroidism, worsening dementia and other chronic diseases  Call care management team with questions or concerns  Verbalize basic understanding of patient centered plan of care established today   Interventions related to HLD, CKD Stage 3, Pulmonary Disease, hypothyroidism and Lewy Body Dementia:  Evaluation of current treatment plans and patient's adherence to plan as established by provider.  The patients wife is compliant with the current plan of care but states the patient has had a rapid decline in his sx/sx.  CCM support in addition to pcp recommendations in place. The patient has PT/OT coming in the home currently. The  patient with increased safety concerns. Ideas and recommendations provided today. See falls care plan. 02-16-2020: the patients wife has worked with the care guide and they have gotten additional approval for Blake Bates, with holistic care to come in and work with the patient. The patients wife is happy about this additional help. 05-17-2020: The patients wife states that the holistic care program is working well for them. The patient has help for 6 hours a week. It is really been helpful for the patient and the spouse. Things are status quo. He is walking with the walker now and doing well.   Assessed patient understanding of disease states.  The patient does not have the ability to comprehend what is taking place with his health and well being. His wife Blake Bates is his primary care giver and understands his condition. Is thankful for CCM support and working with pcp to help with the complex nature of the patients chronic conditions.   Assessed patient's education and care coordination needs.  Care guide referral placed for additional resources and help for the patient with advance dementia. 05-17-2020: Review of caregiver burn out.  The patients wife states she is doing okay. She is thankful for the help and support she is getting. Her children are there when they can be and she sometimes has to call them for help with getting the patient up. The patient is steadily declining in function and memory. The patients wife verbalized she was getting her needs meet. The patient's wife states they are building a new house near her daughter. It is a smaller house and she will be happy to be near her daughter in the event she needs additional help with the patient.   Provided disease specific education to patient.  Review of dietary intake. The patient does not have a good appetite and has had wt loss of 7 pounds in one month. The patient does not like Ensure products but did discuss with the patients wife a milkshake type  recipe that has protein in it. The wife is willing to try this to see if it will help the patient. There has been episodes when the patient ate something different and thought he was doing well with it but then threw it back up. Cheerios and honey nut cheerios seems to be the only thing that the patient will eat. Will research to see if there are additional recommendations to help with dietary nutrition and protein for the patient. Will send the recipe to the patients wife by secure text messaging. 02-16-2020: The patient is eating some. The patient seems to be sleeping more. Usually a solid 12 hours. Usually he wants to go to bed by 7:30 pm but the patients wife tries to keep him up a little longer so that he does not go to bed so early. 05-17-2020: the patient is still eating cheerios and drinking supplemental drinks. The patients wife states he is stable.   Collaborated with appropriate clinical care team members regarding patient needs.  CCM team is involved in the care of the patient. The LCSW has outreach scheduled next week to call and work with the patient and patients wife.  The wife is appreciative of any help and resources she can get.    Patient Self Care Activities related to HLD, CKD Stage 3, Pulmonary Disease, hypothyroidism and Lewy Body Dementia:   Patient is unable to independently self-manage chronic health conditions  Please see past updates related to this goal by clicking on the "Past Updates" button in the selected goal        RNCM: Pt's wife:"He had a fall on Saturday" (pt-stated)        CARE PLAN ENTRY (see longitudinal plan of care for additional care plan information)  Current Barriers:   Knowledge Deficits related to fall precautions in patient with Lewy Body Dementia and other Chronic Conditions  Decreased adherence to prescribed treatment for fall prevention  Care Coordination needs related to fall prevention support systems and resources in a patient with Lewy Body  Dementia  Lacks caregiver support. Wife is primary caregiver. Patients condition is declining. Needs additional assistance in the home.   Cognitive Deficits  Clinical Goal(s):   Over the next 120 days, patient will demonstrate improved adherence to prescribed treatment plan for decreasing falls as evidenced by patient reporting and review of EMR  Over the next 120 days, patient will verbalize using fall risk reduction strategies discussed  Over the next 120 days, patient will not experience additional falls  Over the next 120 days, patient will verbalize understanding of plan for having safety devices in place to help with safety in the patient with frequent falls and balance issues  Over the next 120 days, patient will work with pcp, RNCM, CCM team and in home PT/OT to address needs related to Lewy body dementia with frequent falls and decline in condition  Over the next 120 days, patient will attend all scheduled medical appointments: next appointment with the pcp on 01-31-2020  Over the next 120 days, patient will work with care guides  (community agency) to get resources for support for dementia patients and safety devices  Interventions:   Provided written and verbal education re: Potential causes of falls and Fall prevention strategies  Reviewed medications and discussed potential side effects of medications such as dizziness and frequent urination  Assessed for s/s of orthostatic hypotension.  Falls are related to changes in condition and decline from Lewy Body Dementia.  The patient has not issues related to blood pressure at this time. The patients wife is a Engineer, civil (consulting) and is aware of orthostatic hypotension.   Assessed for falls since last encounter. Per the wife the patient fell on Saturday when she had went out briefly to get things they needed at [facility]. When she got back he had went to the kitchen to get cheerios and fell. She had to call her son in law to come and help  get him up.  He did not know who his son in law was. 02-16-2020: The patient has not had any falls but he will be ambulating and just sit down wherever.  Sometimes his wife can not get him up and she has to call for someone to come and help her. He is not wandering at night because  he has to have someone to help him get up. She has transitioned him to a single twin bed and so far he is doing well with this. Discussed using a gait belt to help with stability. The patients wife states she has a gait belt and will start using this. 05-17-2020: The patients mobility is improved since last outreach. The patient's  wife states that he is walking with the walker and has not had any falls. She is thankful for this improvement in his mobility.   Assessed patients knowledge of fall risk prevention secondary to previously provided education. 02-16-2020: the patients wife is very knowledgeable about fall risk and safety in the home. She has a paid caregiver that stays with her Monday through Wednesday. She is also going to have some additional help in the home for bathing. 05-17-2020: The patient is doing well and no new falls. She has help in the home and it is working well.   Assessed working status of life alert bracelet and patient adherence. Advised the patients wife to call [facility] and ask about the [place] life alert system. It should be a free service to the patient if this was a part of their benefit package.  The wife will do this and see if they can get this service. She does have a blink camera that she uses when she is in the other room or out briefly to check in on the patient.   Provided patient information for fall alert systems.  Provided information for the [place] life alert system through [facility] and ask the patients wife to call and inquire about the system as a benefit through [facility].   Evaluation of current treatment plan related to falls and patient's adherence to plan as established by provider.  Provided  education to patient re: getting the book the 36 hour day. The patients wife has purchased this on the recommendation of the PT personnel and just received it this week.  Also evaluated and educated the wife on care giver stress and needing to take care of self as she is the primary caregiver of the patient. She does have someone who is now coming in (Started this week) who is there Monday through Thursday and this has been a big help  Discussed plans with patient for ongoing care management follow up and provided patient with direct contact information for care management team  Reviewed scheduled/upcoming provider appointments including: No upcoming appointments with the pcp. Will call for changes or needs   Care Guide referral for community resources to help with caregiver support for patients with dementia and dementia resources.   Patient Self Care Activities:   Utilize walker (assistive device) appropriately with all ambulation.  Per the wife the patient is using his walker more; however she sees a big decline in his coordination and balance. The patient also comes in the room with his shoes on the wrong feet. He does get up some now at night. She is going to call the neurologist to get an increase in his medication to help with rest.   De-clutter walkways  Change positions slowly  Wear secure fitting shoes at all times with ambulation  Utilize home lighting for dim lit areas  Have self and pet awareness at all times  Plan:  CCM RN CM will follow up on  07-26-2020 at 3:45 pm The patients wife has the [facility] contact information to call sooner if needed.   Please see past updates related to this goal by clicking on the "Past Updates" button in the selected goal           Plan:   Telephone follow up appointment with care management team member scheduled for: 07-26-2020 at 3:45 pm   Noreene Larsson RN, MSN, [place] Mobile: [phone]

## 2020-05-22 ENCOUNTER — Ambulatory Visit: Payer: Self-pay | Admitting: General Practice

## 2020-05-22 ENCOUNTER — Other Ambulatory Visit: Payer: Self-pay

## 2020-05-22 ENCOUNTER — Emergency Department: Payer: Medicare Other

## 2020-05-22 ENCOUNTER — Inpatient Hospital Stay
Admission: EM | Admit: 2020-05-22 | Discharge: 2020-06-04 | DRG: 177 | Disposition: A | Discharge status: Hospice | Payer: Medicare Other | Attending: Internal Medicine | Admitting: Internal Medicine

## 2020-05-22 DIAGNOSIS — R519 Headache, unspecified: Secondary | ICD-10-CM | POA: Diagnosis not present

## 2020-05-22 DIAGNOSIS — E039 Hypothyroidism, unspecified: Secondary | ICD-10-CM | POA: Diagnosis present

## 2020-05-22 DIAGNOSIS — E870 Hyperosmolality and hypernatremia: Secondary | ICD-10-CM | POA: Diagnosis not present

## 2020-05-22 DIAGNOSIS — Z885 Allergy status to narcotic agent status: Secondary | ICD-10-CM | POA: Diagnosis not present

## 2020-05-22 DIAGNOSIS — E871 Hypo-osmolality and hyponatremia: Secondary | ICD-10-CM | POA: Diagnosis not present

## 2020-05-22 DIAGNOSIS — N179 Acute kidney failure, unspecified: Secondary | ICD-10-CM

## 2020-05-22 DIAGNOSIS — I4891 Unspecified atrial fibrillation: Secondary | ICD-10-CM

## 2020-05-22 DIAGNOSIS — E559 Vitamin D deficiency, unspecified: Secondary | ICD-10-CM | POA: Diagnosis present

## 2020-05-22 DIAGNOSIS — R259 Unspecified abnormal involuntary movements: Secondary | ICD-10-CM

## 2020-05-22 DIAGNOSIS — Z66 Do not resuscitate: Secondary | ICD-10-CM

## 2020-05-22 DIAGNOSIS — J45909 Unspecified asthma, uncomplicated: Secondary | ICD-10-CM | POA: Diagnosis not present

## 2020-05-22 DIAGNOSIS — U071 COVID-19: Secondary | ICD-10-CM | POA: Diagnosis not present

## 2020-05-22 DIAGNOSIS — Z7189 Other specified counseling: Secondary | ICD-10-CM

## 2020-05-22 DIAGNOSIS — Z79890 Hormone replacement therapy (postmenopausal): Secondary | ICD-10-CM

## 2020-05-22 DIAGNOSIS — G3183 Dementia with Lewy bodies: Secondary | ICD-10-CM | POA: Diagnosis present

## 2020-05-22 DIAGNOSIS — R410 Disorientation, unspecified: Secondary | ICD-10-CM

## 2020-05-22 DIAGNOSIS — G9341 Metabolic encephalopathy: Secondary | ICD-10-CM | POA: Diagnosis present

## 2020-05-22 DIAGNOSIS — F0281 Dementia in other diseases classified elsewhere with behavioral disturbance: Secondary | ICD-10-CM | POA: Diagnosis present

## 2020-05-22 DIAGNOSIS — E785 Hyperlipidemia, unspecified: Secondary | ICD-10-CM | POA: Diagnosis present

## 2020-05-22 DIAGNOSIS — E782 Mixed hyperlipidemia: Secondary | ICD-10-CM

## 2020-05-22 DIAGNOSIS — Z515 Encounter for palliative care: Secondary | ICD-10-CM | POA: Diagnosis not present

## 2020-05-22 DIAGNOSIS — D649 Anemia, unspecified: Secondary | ICD-10-CM | POA: Diagnosis not present

## 2020-05-22 DIAGNOSIS — E860 Dehydration: Secondary | ICD-10-CM

## 2020-05-22 DIAGNOSIS — E034 Atrophy of thyroid (acquired): Secondary | ICD-10-CM

## 2020-05-22 DIAGNOSIS — M810 Age-related osteoporosis without current pathological fracture: Secondary | ICD-10-CM | POA: Diagnosis not present

## 2020-05-22 DIAGNOSIS — N1832 Chronic kidney disease, stage 3b: Secondary | ICD-10-CM | POA: Diagnosis not present

## 2020-05-22 DIAGNOSIS — R6520 Severe sepsis without septic shock: Secondary | ICD-10-CM | POA: Diagnosis not present

## 2020-05-22 DIAGNOSIS — G20 Parkinson's disease: Secondary | ICD-10-CM | POA: Diagnosis not present

## 2020-05-22 DIAGNOSIS — G259 Extrapyramidal and movement disorder, unspecified: Secondary | ICD-10-CM | POA: Diagnosis not present

## 2020-05-22 DIAGNOSIS — E538 Deficiency of other specified B group vitamins: Secondary | ICD-10-CM | POA: Diagnosis not present

## 2020-05-22 DIAGNOSIS — I491 Atrial premature depolarization: Secondary | ICD-10-CM | POA: Diagnosis not present

## 2020-05-22 DIAGNOSIS — Z87891 Personal history of nicotine dependence: Secondary | ICD-10-CM

## 2020-05-22 DIAGNOSIS — A4189 Other specified sepsis: Secondary | ICD-10-CM | POA: Diagnosis not present

## 2020-05-22 DIAGNOSIS — F0280 Dementia in other diseases classified elsewhere without behavioral disturbance: Secondary | ICD-10-CM | POA: Diagnosis not present

## 2020-05-22 DIAGNOSIS — F02818 Dementia in other diseases classified elsewhere, unspecified severity, with other behavioral disturbance: Secondary | ICD-10-CM

## 2020-05-22 DIAGNOSIS — I480 Paroxysmal atrial fibrillation: Secondary | ICD-10-CM

## 2020-05-22 DIAGNOSIS — N189 Chronic kidney disease, unspecified: Secondary | ICD-10-CM

## 2020-05-22 DIAGNOSIS — A419 Sepsis, unspecified organism: Secondary | ICD-10-CM | POA: Diagnosis not present

## 2020-05-22 DIAGNOSIS — G20A1 Parkinson's disease without dyskinesia, without mention of fluctuations: Secondary | ICD-10-CM

## 2020-05-22 DIAGNOSIS — F05 Delirium due to known physiological condition: Secondary | ICD-10-CM | POA: Diagnosis not present

## 2020-05-22 DIAGNOSIS — R1310 Dysphagia, unspecified: Secondary | ICD-10-CM | POA: Diagnosis present

## 2020-05-22 DIAGNOSIS — Z79899 Other long term (current) drug therapy: Secondary | ICD-10-CM

## 2020-05-22 DIAGNOSIS — R0689 Other abnormalities of breathing: Secondary | ICD-10-CM | POA: Diagnosis not present

## 2020-05-22 DIAGNOSIS — Z9109 Other allergy status, other than to drugs and biological substances: Secondary | ICD-10-CM

## 2020-05-22 DIAGNOSIS — J309 Allergic rhinitis, unspecified: Secondary | ICD-10-CM | POA: Diagnosis present

## 2020-05-22 DIAGNOSIS — R531 Weakness: Secondary | ICD-10-CM

## 2020-05-22 DIAGNOSIS — R262 Difficulty in walking, not elsewhere classified: Secondary | ICD-10-CM

## 2020-05-22 DIAGNOSIS — R509 Fever, unspecified: Secondary | ICD-10-CM | POA: Diagnosis not present

## 2020-05-22 LAB — DIFFERENTIAL
Abs Immature Granulocytes: 0.03 10*3/uL (ref 0.00–0.07)
Basophils Absolute: 0 10*3/uL (ref 0.0–0.1)
Basophils Relative: 0 %
Eosinophils Absolute: 0 10*3/uL (ref 0.0–0.5)
Eosinophils Relative: 0 %
Immature Granulocytes: 0 %
Lymphocytes Relative: 6 %
Lymphs Abs: 0.4 10*3/uL — ABNORMAL LOW (ref 0.7–4.0)
Monocytes Absolute: 0.9 10*3/uL (ref 0.1–1.0)
Monocytes Relative: 12 %
Neutro Abs: 6 10*3/uL (ref 1.7–7.7)
Neutrophils Relative %: 82 %

## 2020-05-22 LAB — CBC
HCT: 36.9 % — ABNORMAL LOW (ref 39.0–52.0)
Hemoglobin: 11.9 g/dL — ABNORMAL LOW (ref 13.0–17.0)
MCH: 29.8 pg (ref 26.0–34.0)
MCHC: 32.2 g/dL (ref 30.0–36.0)
MCV: 92.3 fL (ref 80.0–100.0)
Platelets: 194 10*3/uL (ref 150–400)
RBC: 4 MIL/uL — ABNORMAL LOW (ref 4.22–5.81)
RDW: 13.8 % (ref 11.5–15.5)
WBC: 7.3 10*3/uL (ref 4.0–10.5)
nRBC: 0 % (ref 0.0–0.2)

## 2020-05-22 LAB — COMPREHENSIVE METABOLIC PANEL
ALT: 21 U/L (ref 0–44)
AST: 27 U/L (ref 15–41)
Albumin: 4 g/dL (ref 3.5–5.0)
Alkaline Phosphatase: 82 U/L (ref 38–126)
Anion gap: 7 (ref 5–15)
BUN: 38 mg/dL — ABNORMAL HIGH (ref 8–23)
CO2: 27 mmol/L (ref 22–32)
Calcium: 9 mg/dL (ref 8.9–10.3)
Chloride: 100 mmol/L (ref 98–111)
Creatinine, Ser: 1.61 mg/dL — ABNORMAL HIGH (ref 0.61–1.24)
GFR, Estimated: 43 mL/min — ABNORMAL LOW (ref >60)
Glucose, Bld: 105 mg/dL — ABNORMAL HIGH (ref 70–99)
Potassium: 4.1 mmol/L (ref 3.5–5.1)
Sodium: 134 mmol/L — ABNORMAL LOW (ref 135–145)
Total Bilirubin: 0.8 mg/dL (ref 0.3–1.2)
Total Protein: 7.3 g/dL (ref 6.5–8.1)

## 2020-05-22 LAB — PROTIME-INR
INR: 0.9 (ref 0.8–1.2)
Prothrombin Time: 11.8 seconds (ref 11.4–15.2)

## 2020-05-22 LAB — LACTIC ACID, PLASMA: Lactic Acid, Venous: 1.1 mmol/L (ref 0.5–1.9)

## 2020-05-22 LAB — POC SARS CORONAVIRUS 2 AG -  ED: SARS Coronavirus 2 Ag: POSITIVE — AB

## 2020-05-22 LAB — APTT: aPTT: 35 seconds (ref 24–36)

## 2020-05-22 MED ORDER — SODIUM CHLORIDE 0.9 % IV BOLUS
500.0000 mL | Freq: Once | INTRAVENOUS | Status: AC
  Administered 2020-05-22: 500 mL via INTRAVENOUS

## 2020-05-22 MED ORDER — ACETAMINOPHEN 325 MG PO TABS
650.0000 mg | ORAL_TABLET | Freq: Once | ORAL | Status: AC | PRN
  Administered 2020-05-22: 650 mg via ORAL
  Filled 2020-05-22: qty 2

## 2020-05-22 NOTE — Chronic Care Management (AMB) (Signed)
Chronic Care Management   Follow Up Note   05/22/2020 Name: Blake Bates MRN: [id number] DOB: 10/03/1940  Referred by: [name], DO Reason for referral : Chronic Care Management (RNCM: VM from the wife asking for a call back)   Blake Bates is a 80 y.o. year old male who is a primary care patient of [name], DO. The CCM team was consulted for assistance with chronic disease management and care coordination needs.    Review of patient status, including review of consultants reports, relevant laboratory and other test results, and collaboration with appropriate care team members and the patient's provider was performed as part of comprehensive patient evaluation and provision of chronic care management services.    SDOH (Social Determinants of Health) assessments performed: No See Care Plan activities for detailed interventions related to [facility])     Outpatient Encounter Medications as of 05/22/2020  Medication Sig   Acetaminophen (TYLENOL ARTHRITIS PAIN PO) Take by mouth. Once a day   albuterol (PROVENTIL HFA;VENTOLIN HFA) 108 (90 BASE) MCG/ACT inhaler Inhale into the lungs every 6 (six) hours as needed for wheezing or shortness of breath.   carbidopa-levodopa (SINEMET IR) 25-100 MG tablet Take 1/2 tablet three times a day with meals   Cholecalciferol (VITAMIN D-3 PO) Take 5,000 Units/oz/day by mouth daily. PM    Cyanocobalamin (VITAMIN B-12) 5000 MCG SUBL Place under the tongue daily.   donepezil (ARICEPT) 10 MG tablet Take 1 tablet daily   levothyroxine (SYNTHROID) 50 MCG tablet TAKE 1 TABLET(50 MCG) BY MOUTH DAILY BEFORE BREAKFAST   loratadine (CLARITIN) 10 MG tablet Take 10 mg by mouth daily as needed for allergies. PM   meloxicam (MOBIC) 15 MG tablet TAKE 1 TABLET BY MOUTH DAILY AS NEEDED FOR PAIN AND ARTHRITIS FLARE, TAKE UP TO 1 TO 2 WEEKS AT A TIME   mirtazapine (REMERON) 45 MG tablet Take 1 tablet (45 mg total) by mouth at  bedtime.   neomycin-polymyxin-pramoxine (NEOSPORIN PLUS) 1 % cream Apply topically 2 (two) times daily.   No facility-administered encounter medications on file as of 05/22/2020.     Objective:   Goals Addressed              This Visit's Progress     RNCM-Pt's wife:"I am having a hard time getting him to eat" (pt-stated)        CARE PLAN ENTRY (see longtitudinal plan of care for additional care plan information)  Current Barriers:   Chronic Disease Management support, education, and care coordination needs related to HLD, CKD Stage 3, Pulmonary Disease, hypothyroidism and Lewy Body Dementia   Clinical Goal(s) related to HLD, CKD Stage 3, Pulmonary Disease, hypothyroidism and Lewy Body Dementia:  Over the next 120 days, patient will:   Work with the care management team to address educational, disease management, and care coordination needs   Begin or continue self health monitoring activities as directed today Measure and record blood pressure 2/3 times per week and encouraging meals to help with nutritional needs and decline in weight loss  Call provider office for new or worsened signs and symptoms Blood pressure findings outside established parameters, Chest pain, Shortness of breath, and New or worsened symptom related to hypothyroidism, worsening dementia and other chronic diseases  Call care management team with questions or concerns  Verbalize basic understanding of patient centered plan of care established today   Interventions related to HLD, CKD Stage 3, Pulmonary Disease, hypothyroidism and Lewy Body Dementia:  Evaluation of current treatment plans and patient's adherence to plan as established by provider.  The patients wife is compliant with the current plan of care but states the patient has had a rapid decline in his sx/sx.  CCM support in addition to pcp recommendations in place. The patient has PT/OT coming in the home currently. The patient with increased  safety concerns. Ideas and recommendations provided today. See falls care plan. 02-16-2020: the patients wife has worked with the care guide and they have gotten additional approval for Anne Ng, with holistic care to come in and work with the patient. The patients wife is happy about this additional help. 05-17-2020: The patients wife states that the holistic care program is working well for them. The patient has help for 6 hours a week. It is really been helpful for the patient and the spouse. Things are status quo. He is walking with the walker now and doing well. 05-22-2020: The patients wife called and said there has been change in the patients mental status and ability to move. He is listing to the left side and unable to walk as he was earlier in the day. Due to his dementia he can not answer questions appropriately. Instant messaging chat sent to the pcp. RNCM advised the patients wife [name] to call 911 and have the patient evaluated at the ER. Pcp responded to the chat and advised this was appropriate action. Information given to the wife. She will call 911 and have the patient evaluated in the ER. Will continue to monitor.   Assessed patient understanding of disease states.  The patient does not have the ability to comprehend what is taking place with his health and well being. His wife [name] is his primary care giver and understands his condition. Is thankful for CCM support and working with pcp to help with the complex nature of the patients chronic conditions.   Assessed patient's education and care coordination needs.  Care guide referral placed for additional resources and help for the patient with advance dementia. 05-17-2020: Review of caregiver burn out.  The patients wife states she is doing okay. She is thankful for the help and support she is getting. Her children are there when they can be and she sometimes has to call them for help with getting the patient up. The patient is steadily declining in  function and memory. The patients wife verbalized she was getting her needs meet. The patient's wife states they are building a new house near her daughter. It is a smaller house and she will be happy to be near her daughter in the event she needs additional help with the patient.   Provided disease specific education to patient.  Review of dietary intake. The patient does not have a good appetite and has had wt loss of 7 pounds in one month. The patient does not like Ensure products but did discuss with the patients wife a milkshake type recipe that has protein in it. The wife is willing to try this to see if it will help the patient. There has been episodes when the patient ate something different and thought he was doing well with it but then threw it back up. Cheerios and honey nut cheerios seems to be the only thing that the patient will eat. Will research to see if there are additional recommendations to help with dietary nutrition and protein for the patient. Will send the recipe to the patients wife by secure text messaging. 02-16-2020: The patient is  eating some. The patient seems to be sleeping more. Usually a solid 12 hours. Usually he wants to go to bed by 7:30 pm but the patients wife tries to keep him up a little longer so that he does not go to bed so early. 05-17-2020: the patient is still eating cheerios and drinking supplemental drinks. The patients wife states he is stable.   Collaborated with appropriate clinical care team members regarding patient needs.  CCM team is involved in the care of the patient. The LCSW has outreach scheduled next week to call and work with the patient and patients wife. The wife is appreciative of any help and resources she can get.    Patient Self Care Activities related to HLD, CKD Stage 3, Pulmonary Disease, hypothyroidism and Lewy Body Dementia:   Patient is unable to independently self-manage chronic health conditions  Please see past updates related to  this goal by clicking on the "Past Updates" button in the selected goal           Plan:   The care management team will reach out to the patient again over the next 30 days.    Noreene Larsson RN, MSN, [place] Mobile: [phone]

## 2020-05-22 NOTE — Patient Instructions (Signed)
Visit Information  Goals Addressed              This Visit's Progress   .  RNCM-Pt's wife:"I am having a hard time getting him to eat" (pt-stated)        CARE PLAN ENTRY (see longtitudinal plan of care for additional care plan information)  Current Barriers:  . Chronic Disease Management support, education, and care coordination needs related to HLD, CKD Stage 3, Pulmonary Disease, hypothyroidism and Lewy Body Dementia  . Clinical Goal(s) related to HLD, CKD Stage 3, Pulmonary Disease, hypothyroidism and Lewy Body Dementia:  Over the next 120 days, patient will:  . Work with the care management team to address educational, disease management, and care coordination needs  . Begin or continue self health monitoring activities as directed today Measure and record blood pressure 2/3 times per week and encouraging meals to help with nutritional needs and decline in weight loss . Call provider office for new or worsened signs and symptoms Blood pressure findings outside established parameters, Chest pain, Shortness of breath, and New or worsened symptom related to hypothyroidism, worsening dementia and other chronic diseases . Call care management team with questions or concerns . Verbalize basic understanding of patient centered plan of care established today  . Interventions related to HLD, CKD Stage 3, Pulmonary Disease, hypothyroidism and Lewy Body Dementia:  . Evaluation of current treatment plans and patient's adherence to plan as established by provider.  The patients wife is compliant with the current plan of care but states the patient has had a rapid decline in his sx/sx.  CCM support in addition to pcp recommendations in place. The patient has PT/OT coming in the home currently. The patient with increased safety concerns. Ideas and recommendations provided today. See falls care plan. 02-16-2020: the patients wife has worked with the care guide and they have gotten additional approval for  Anne Ng, with holistic care to come in and work with the patient. The patients wife is happy about this additional help. 05-17-2020: The patients wife states that the holistic care program is working well for them. The patient has help for 6 hours a week. It is really been helpful for the patient and the spouse. Things are status quo. He is walking with the walker now and doing well. 05-22-2020: The patients wife called and said there has been change in the patients mental status and ability to move. He is listing to the left side and unable to walk as he was earlier in the day. Due to his dementia he can not answer questions appropriately. Instant messaging chat sent to the pcp. RNCM advised the patients wife [name] to call 911 and have the patient evaluated at the ER. Pcp responded to the chat and advised this was appropriate action. Information given to the wife. She will call 911 and have the patient evaluated in the ER. Will continue to monitor.  . Assessed patient understanding of disease states.  The patient does not have the ability to comprehend what is taking place with his health and well being. His wife [name] is his primary care giver and understands his condition. Is thankful for CCM support and working with pcp to help with the complex nature of the patients chronic conditions.  . Assessed patient's education and care coordination needs.  Care guide referral placed for additional resources and help for the patient with advance dementia. 05-17-2020: Review of caregiver burn out.  The patients wife states she is doing okay. She  is thankful for the help and support she is getting. Her children are there when they can be and she sometimes has to call them for help with getting the patient up. The patient is steadily declining in function and memory. The patients wife verbalized she was getting her needs meet. The patient's wife states they are building a new house near her daughter. It is a smaller house and  she will be happy to be near her daughter in the event she needs additional help with the patient.  . Provided disease specific education to patient.  Review of dietary intake. The patient does not have a good appetite and has had wt loss of 7 pounds in one month. The patient does not like Ensure products but did discuss with the patients wife a milkshake type recipe that has protein in it. The wife is willing to try this to see if it will help the patient. There has been episodes when the patient ate something different and thought he was doing well with it but then threw it back up. Cheerios and honey nut cheerios seems to be the only thing that the patient will eat. Will research to see if there are additional recommendations to help with dietary nutrition and protein for the patient. Will send the recipe to the patients wife by secure text messaging. 02-16-2020: The patient is eating some. The patient seems to be sleeping more. Usually a solid 12 hours. Usually he wants to go to bed by 7:30 pm but the patients wife tries to keep him up a little longer so that he does not go to bed so early. 05-17-2020: the patient is still eating cheerios and drinking supplemental drinks. The patients wife states he is stable.  Nash Dimmer with appropriate clinical care team members regarding patient needs.  CCM team is involved in the care of the patient. The LCSW has outreach scheduled next week to call and work with the patient and patients wife. The wife is appreciative of any help and resources she can get.   . Patient Self Care Activities related to HLD, CKD Stage 3, Pulmonary Disease, hypothyroidism and Lewy Body Dementia:  . Patient is unable to independently self-manage chronic health conditions  Please see past updates related to this goal by clicking on the "Past Updates" button in the selected goal         The patient verbalized understanding of instructions, educational materials, and care plan provided  today and declined offer to receive copy of patient instructions, educational materials, and care plan.   The care management team will reach out to the patient again over the next 30 days.   Vanita Ingles

## 2020-05-22 NOTE — ED Triage Notes (Signed)
Pt to ED via [facility] from home for chief complaint of weakness that started today. LKW 0900 this morning.  Wife reports dementia and confused at baseline. Baseline walks with walker, reports unable to use walker this afternoon. Full movement in all extremities, not following all commands, face appears symmetrical, clear speech Pt oriented to person only.  Denies pain  Febrile on arrival

## 2020-05-22 NOTE — ED Notes (Signed)
First nurse note-pt brought in via ems from home to triage via wheelchair.  Pt alert.  Pt has dementia.  Pt unable to walk today without assistance. Altered mental status per ems.  Wife with pt.

## 2020-05-22 NOTE — ED Provider Notes (Addendum)
[facility] Emergency Department Provider Note  ____________________________________________   Event Date/Time   First MD Initiated Contact with Patient 05/22/20 2057     (approximate)  I have reviewed the triage vital signs and the nursing notes.   HISTORY  Chief Complaint Weakness.  HPI Blake Bates is a 80 y.o. male with dementia is confused at baseline who ambulates with a walker who comes in for confusion.  Patient was unable to get up using a walker.  According to patient's wife he has a history of dementia.  He is alert and oriented x2.  She noted this morning that he originally seemed okay but then later this afternoon he was having difficulty getting up and was not able to walk with a walker.  She stated that it just seems like generalized weakness is constant, nothing makes better, nothing makes it worse.  Denies anyone else being sick at home.  No COVID contacts.  Was vaccinated.  He denies any complaints.  Denies any chest pain, shortness of breath, back pain, abdominal pain.          Past Medical History:  Diagnosis Date  . Asthma   . Diverticulosis   . Full dentures   . Hyperlipidemia   . Hypothyroidism    s/p subtotal thyroidectomy  . Muscle spasms of lower extremity    Right upper leg  . Osteoporosis   . Rhinitis, allergic    pollens, mold, animal dander    Patient Active Problem List   Diagnosis Date Noted  . Normocytic anemia 01/31/2020  . Recurrent falls 10/31/2019  . History of CVA (cerebrovascular accident) 10/31/2019  . Generalized muscle weakness 10/31/2019  . Oropharyngeal dysphagia 06/21/2019  . Moderate protein-calorie malnutrition ([place]) 06/21/2019  . Balance disorder 06/21/2019  . Left homonymous hemianopsia 06/21/2019  . Pain due to onychomycosis of toenails of both feet 01/03/2019  . Lewy body dementia without behavioral disturbance ([place]) 07/21/2017  . Elevated PSA 12/09/2016  . Vitamin D deficiency  12/09/2016  . Chronic pain of right knee 11/04/2016  . Hyperlipidemia 11/04/2016  . CKD (chronic kidney disease), stage III ([place]) 11/04/2016  . Osteoarthritis of multiple joints 11/03/2016  . Chronic right hip pain 11/03/2016  . 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 Hypothyroidism 11/03/2014  . Asthma 11/03/2014  . Osteoporosis 11/03/2014  . Skin lesion of chest wall 11/03/2014  . Trigger finger of both hands 11/03/2014    Past Surgical History:  Procedure Laterality Date  . CATARACT EXTRACTION W/PHACO Left 09/20/2014   Procedure: CATARACT EXTRACTION PHACO AND INTRAOCULAR LENS PLACEMENT (IOC);  Surgeon: Leandrew Koyanagi, MD;  Location: [place];  Service: Ophthalmology;  Laterality: Left;  . COLONOSCOPY    . PARATHYROIDECTOMY    . THYROID SURGERY     subtotal    Prior to Admission medications   Medication Sig Start Date End Date Taking? Authorizing Provider  Acetaminophen (TYLENOL ARTHRITIS PAIN PO) Take by mouth. Once a day    [provider]  albuterol (PROVENTIL HFA;VENTOLIN HFA) 108 (90 BASE) MCG/ACT inhaler Inhale into the lungs every 6 (six) hours as needed for wheezing or shortness of breath.    [provider]  carbidopa-levodopa (SINEMET IR) 25-100 MG tablet Take 1/2 tablet three times a day with meals 03/06/20   Cameron Sprang, MD  Cholecalciferol (VITAMIN D-3 PO) Take 5,000 Units/oz/day by mouth daily. PM     [provider]  Cyanocobalamin (VITAMIN B-12) 5000 MCG SUBL Place  under the tongue daily.    [provider]  donepezil (ARICEPT) 10 MG tablet Take 1 tablet daily 03/06/20   Cameron Sprang, MD  levothyroxine (SYNTHROID) 50 MCG tablet TAKE 1 TABLET(50 MCG) BY MOUTH DAILY BEFORE BREAKFAST 04/07/20   Karamalegos, Devonne Doughty, DO  loratadine (CLARITIN) 10 MG tablet Take 10 mg by mouth daily as needed for allergies. PM    [provider]  meloxicam (MOBIC) 15 MG tablet TAKE 1 TABLET BY  MOUTH DAILY AS NEEDED FOR PAIN AND ARTHRITIS FLARE, TAKE UP TO 1 TO 2 WEEKS AT A TIME 12/28/19   Parks Ranger, Devonne Doughty, DO  mirtazapine (REMERON) 45 MG tablet Take 1 tablet (45 mg total) by mouth at bedtime. 04/10/20   Cameron Sprang, MD  neomycin-polymyxin-pramoxine (NEOSPORIN PLUS) 1 % cream Apply topically 2 (two) times daily. 10/06/19   Sable Feil, PA-C    Allergies Mold extract  [trichophyton] and Morphine  Family History  Problem Relation Age of Onset  . Stroke Mother 23  . Lung cancer Father 59  . Prostate cancer Paternal Grandfather 51  . Stroke Paternal Aunt     Social History Social History   Tobacco Use  . Smoking status: Former Smoker    Packs/day: 0.00    Years: 60.00    Pack years: 0.00    Types: Pipe    Quit date: 05/12/2016    Years since quitting: 4.0  . Smokeless tobacco: Former Systems developer  . Tobacco comment: Self taper down from full pipe daily down to half to quarter pipe  Vaping Use  . Vaping Use: Never used  Substance Use Topics  . Alcohol use: Not Currently    Comment: occasionally beer or wine   . Drug use: No      Review of Systems Constitutional: Fevers, generalized weakness Eyes: No visual changes. ENT: No sore throat. Cardiovascular: Denies chest pain. Respiratory: Denies shortness of breath. Gastrointestinal: No abdominal pain.  No nausea, no vomiting.  No diarrhea.  No constipation. Genitourinary: Negative for dysuria. Musculoskeletal: Negative for back pain. Skin: Negative for rash. Neurological: Negative for headaches, focal weakness or numbness. All other ROS negative ____________________________________________   PHYSICAL EXAM:  VITAL SIGNS: ED Triage Vitals  Enc Vitals Group     BP 05/22/20 1804 106/78     Pulse Rate 05/22/20 1804 86     Resp 05/22/20 1808 18     Temp 05/22/20 1801 (!) 101.3 F (38.5 C)     Temp Source 05/22/20 1801 Oral     SpO2 05/22/20 1758 96 %     Weight 05/22/20 1802 125 lb (56.7 kg)     Height  05/22/20 1802 5\' 11"  (1.803 m)     Head Circumference --      Peak Flow --      Pain Score --      Pain Loc --      Pain Edu? --      Excl. in [place]? --     Constitutional: Alert and oriented x2, pleasant elderly male Eyes: Conjunctivae are normal. EOMI. Head: Atraumatic. Nose: No congestion/rhinnorhea. Mouth/Throat: Mucous membranes are moist.   Neck: No stridor. Trachea Midline. FROM Cardiovascular: Normal rate, regular rhythm. Grossly normal heart sounds.  Good peripheral circulation. Respiratory: Normal respiratory effort.  No retractions. Lungs CTAB. Gastrointestinal: Soft and nontender. No distention. No abdominal bruits.  Musculoskeletal: No lower extremity tenderness nor edema.  No joint effusions. Neurologic:  Normal speech and language. No gross focal neurologic  deficits are appreciated.  Equal strength in arms and legs. Skin:  Skin is warm, dry and intact. No rash noted. Psychiatric: Mood and affect are normal. Speech and behavior are normal. GU: Deferred   ____________________________________________   LABS (all labs ordered are listed, but only abnormal results are displayed)  Labs Reviewed  CBC - Abnormal; Notable for the following components:      Result Value   RBC 4.00 (*)    Hemoglobin 11.9 (*)    HCT 36.9 (*)    All other components within normal limits  DIFFERENTIAL - Abnormal; Notable for the following components:   Lymphs Abs 0.4 (*)    All other components within normal limits  COMPREHENSIVE METABOLIC PANEL - Abnormal; Notable for the following components:   Sodium 134 (*)    Glucose, Bld 105 (*)    BUN 38 (*)    Creatinine, Ser 1.61 (*)    GFR, Estimated 43 (*)    All other components within normal limits  PROTIME-INR  APTT  LACTIC ACID, PLASMA  URINALYSIS, COMPLETE (UACMP) WITH MICROSCOPIC  POC SARS CORONAVIRUS 2 AG -  ED   ____________________________________________   ED ECG REPORT I, Vanessa [place], the attending physician, personally  viewed and interpreted this ECG.  Normal sinus rate of 89 with occasional PACs, no ST elevation, no T wave inversions, normal intervals ____________________________________________  RADIOLOGY   Official radiology report(s): DG Chest 2 View  Result Date: 05/22/2020 CLINICAL DATA:  Weakness EXAM: CHEST - 2 VIEW COMPARISON:  November 24, 2017 FINDINGS: Cardiomediastinal contours and hilar structures are normal. Biapical pleural and parenchymal scarring as before. No lobar consolidation.  No sign of pleural effusion. Osteopenia.  Spinal degenerative changes. Midthoracic vertebral compression fracture that is new compared to November 24, 2017 approximately 40% loss of height. IMPRESSION: 1. No active cardiopulmonary disease. 2. Midthoracic vertebral compression fracture that is new compared to November 24, 2017 approximately 40% loss of height. This is age indeterminate but new compared to this remote prior exam. Correlate with any point tenderness in this location to determine whether this could represent an acute compression fracture. Electronically Signed   By: Zetta Bills M.D.   On: 05/22/2020 18:52   CT HEAD WO CONTRAST  Result Date: 05/22/2020 CLINICAL DATA:  Headache weakness EXAM: CT HEAD WITHOUT CONTRAST TECHNIQUE: Contiguous axial images were obtained from the base of the skull through the vertex without intravenous contrast. COMPARISON:  CT 10/06/2019, MRI 05/12/2019 FINDINGS: Brain: No acute territorial infarction, hemorrhage or new intracranial mass. Bilateral right greater than left parietal convexity CSF densities corresponding to arachnoid cysts on previous MRI. Advanced atrophy. Mild chronic small vessel ischemic change of the white matter. Stable degree of lateral and third ventricular dilatation. Vascular: No hyperdense vessels.  Carotid vascular calcification. Skull: Normal. Negative for fracture or focal lesion. Sinuses/Orbits: Opacified right maxillary sinus. Mucosal disease in the ethmoid  sinuses Other: None IMPRESSION: 1. No CT evidence for acute intracranial abnormality. 2. Atrophy and mild chronic small vessel ischemic change of the white matter. 3. Stable degree of ventricular enlargement. Grossly stable bilateral parietal arachnoid cysts. Electronically Signed   By: Donavan Foil M.D.   On: 05/22/2020 18:48    ____________________________________________   PROCEDURES  Procedure(s) performed (including Critical Care):  Procedures   ____________________________________________   INITIAL IMPRESSION / ASSESSMENT AND PLAN / ED COURSE  DESTINE AMBROISE was evaluated in Emergency Department on 05/22/2020 for the symptoms described in the history of present illness. He was  evaluated in the context of the global COVID-19 pandemic, which necessitated consideration that the patient might be at risk for infection with the SARS-CoV-2 virus that causes COVID-19. Institutional protocols and algorithms that pertain to the evaluation of patients at risk for COVID-19 are in a state of rapid change based on information released by regulatory bodies including the CDC and federal and state organizations. These policies and algorithms were followed during the patient's care in the ED.    Patient is a 80 year old gentleman who comes in for weakness.  Patient has a history of dementia so history obtained by the wife.  Will get labs to evaluate Electra abnormalities, AKI, sepsis.  CT scan was ordered to evaluate for altered mental status to evaluate for bleed, mass.  Chest x-ray was ordered evaluate for pneumonia.  UA ordered to evaluate for UTI and COVID swab.  Lactate normal Kidney function around baseline  CT scan is stable with slightly increased ventricular enlargement.  Discussed with family and they stated they were aware that he had some enlarged ventricles but they are continue to follow that.  They do not seem to be changed today so do not think this is causing his weakness.  X-ray  was concerning for vertebral compression fracture.  I did palpate his back and he has no new tenderness.  Updated family on the results  Straight cath was attempted but unable to be successful due to enlarged prostate.  Patient has urinary incontinence at baseline.  We will place a condom cath.  Labs so far reassuring with a normal white count although absolute lymphs are slightly low   At this time patient does not meet sepsis criteria.  He is very well-appearing.  I did another abdominal exam and he continues to be nontender.  His temperature has come down with the Tylenol from earlier.  We will give patient 500 cc of fluid while waiting UA.  Patient handed off to oncoming team pending COVID swab and UA.  Patient will need to be ambulated to see if he is safe for dispo home versus coming to the hospital for his weakness.   Test was positive prior to me leaving.  Discussed with family and will ambulate to see if he is safe for discharge home and will need to be admitted for his weakness.  At this time suspect the source of do not think we need to wait for the UA unless family feels strongly about it   ____________________________________________   FINAL CLINICAL IMPRESSION(S) / ED DIAGNOSES   Final diagnoses:  Weakness  COVID-19      MEDICATIONS GIVEN DURING THIS VISIT:  Medications  acetaminophen (TYLENOL) tablet 650 mg (has no administration in time range)  sodium chloride 0.9 % bolus 500 mL (500 mLs Intravenous Bolus from Bag 05/22/20 2246)     ED Discharge Orders    None       Note:  This document was prepared using Dragon voice recognition software and may include unintentional dictation errors.   Vanessa [place], MD 05/22/20 2245    Vanessa [place], MD 05/22/20 2312

## 2020-05-22 NOTE — ED Provider Notes (Signed)
-----------------------------------------   11:04 PM on 05/22/2020 -----------------------------------------  Assuming care from Dr. Jari Pigg.  In short, Blake Bates is a 80 y.o. male with a chief complaint of delirium, COVID-19, new inability to walk.  Refer to the original H&P for additional details.  The current plan of care is to follow up UA and assess patient's ability to ambulate.   ----------------------------------------- 1:41 AM on 05/23/2020 -----------------------------------------  Urinalysis was unremarkable.  There was a small amount of hematuria but this is in the setting of a failed in and out catheterizations so I do not suspect this represents acute infection.  I ordered a urine culture just to be safe but a UTI is an unlikely diagnosis.  The patient failed a trial of ambulation.  He was unable to stand steadily even with 2 assistants, 1 on either side of him.  Additionally, his mental status continues to be worse than baseline, consistent with acute delirium in the setting of his COVID-19 diagnosis.  I had an extended conversation with his wife about the appropriate disposition.  Given his worsening mental status and fever, I think the best if not ideal solution is for him to be admitted to the hospitalist service.  He cannot receive care at home and he may respond to treatment with remdesivir and hydration.  She agreed, so I spoke with Dr. Sidney Ace with the hospitalist service.  He also agreed with the plan and will admit.   Final diagnoses:  Weakness  COVID-19  Delirium  Chronic kidney disease, unspecified CKD stage  Dehydration  Unable to ambulate  Parkinsonian features      Hinda Kehr, MD 05/23/20 [phone]

## 2020-05-22 NOTE — ED Notes (Signed)
IN and out cath was attempted twice on this pt and was unsucessful

## 2020-05-23 DIAGNOSIS — E039 Hypothyroidism, unspecified: Secondary | ICD-10-CM | POA: Diagnosis not present

## 2020-05-23 DIAGNOSIS — F05 Delirium due to known physiological condition: Secondary | ICD-10-CM | POA: Diagnosis not present

## 2020-05-23 DIAGNOSIS — J45909 Unspecified asthma, uncomplicated: Secondary | ICD-10-CM | POA: Diagnosis present

## 2020-05-23 DIAGNOSIS — D649 Anemia, unspecified: Secondary | ICD-10-CM | POA: Diagnosis present

## 2020-05-23 DIAGNOSIS — F0281 Dementia in other diseases classified elsewhere with behavioral disturbance: Secondary | ICD-10-CM | POA: Diagnosis present

## 2020-05-23 DIAGNOSIS — A4189 Other specified sepsis: Secondary | ICD-10-CM

## 2020-05-23 DIAGNOSIS — R531 Weakness: Secondary | ICD-10-CM | POA: Diagnosis not present

## 2020-05-23 DIAGNOSIS — R404 Transient alteration of awareness: Secondary | ICD-10-CM | POA: Diagnosis not present

## 2020-05-23 DIAGNOSIS — Z743 Need for continuous supervision: Secondary | ICD-10-CM | POA: Diagnosis not present

## 2020-05-23 DIAGNOSIS — R1310 Dysphagia, unspecified: Secondary | ICD-10-CM | POA: Diagnosis present

## 2020-05-23 DIAGNOSIS — M810 Age-related osteoporosis without current pathological fracture: Secondary | ICD-10-CM | POA: Diagnosis present

## 2020-05-23 DIAGNOSIS — R6889 Other general symptoms and signs: Secondary | ICD-10-CM | POA: Diagnosis not present

## 2020-05-23 DIAGNOSIS — R279 Unspecified lack of coordination: Secondary | ICD-10-CM | POA: Diagnosis not present

## 2020-05-23 DIAGNOSIS — U071 COVID-19: Secondary | ICD-10-CM | POA: Diagnosis not present

## 2020-05-23 DIAGNOSIS — N1832 Chronic kidney disease, stage 3b: Secondary | ICD-10-CM | POA: Diagnosis not present

## 2020-05-23 DIAGNOSIS — E860 Dehydration: Secondary | ICD-10-CM | POA: Diagnosis not present

## 2020-05-23 DIAGNOSIS — I4891 Unspecified atrial fibrillation: Secondary | ICD-10-CM | POA: Diagnosis not present

## 2020-05-23 DIAGNOSIS — R0602 Shortness of breath: Secondary | ICD-10-CM | POA: Diagnosis not present

## 2020-05-23 DIAGNOSIS — G9341 Metabolic encephalopathy: Secondary | ICD-10-CM

## 2020-05-23 DIAGNOSIS — N179 Acute kidney failure, unspecified: Secondary | ICD-10-CM | POA: Diagnosis not present

## 2020-05-23 DIAGNOSIS — E538 Deficiency of other specified B group vitamins: Secondary | ICD-10-CM | POA: Diagnosis present

## 2020-05-23 DIAGNOSIS — R6520 Severe sepsis without septic shock: Secondary | ICD-10-CM

## 2020-05-23 DIAGNOSIS — E785 Hyperlipidemia, unspecified: Secondary | ICD-10-CM | POA: Diagnosis present

## 2020-05-23 DIAGNOSIS — Z885 Allergy status to narcotic agent status: Secondary | ICD-10-CM | POA: Diagnosis not present

## 2020-05-23 DIAGNOSIS — J309 Allergic rhinitis, unspecified: Secondary | ICD-10-CM | POA: Diagnosis present

## 2020-05-23 DIAGNOSIS — R410 Disorientation, unspecified: Secondary | ICD-10-CM | POA: Diagnosis not present

## 2020-05-23 DIAGNOSIS — N189 Chronic kidney disease, unspecified: Secondary | ICD-10-CM | POA: Diagnosis not present

## 2020-05-23 DIAGNOSIS — E871 Hypo-osmolality and hyponatremia: Secondary | ICD-10-CM | POA: Diagnosis present

## 2020-05-23 DIAGNOSIS — G3183 Dementia with Lewy bodies: Secondary | ICD-10-CM | POA: Diagnosis present

## 2020-05-23 DIAGNOSIS — I480 Paroxysmal atrial fibrillation: Secondary | ICD-10-CM | POA: Diagnosis not present

## 2020-05-23 DIAGNOSIS — A419 Sepsis, unspecified organism: Secondary | ICD-10-CM | POA: Diagnosis not present

## 2020-05-23 DIAGNOSIS — Z87891 Personal history of nicotine dependence: Secondary | ICD-10-CM | POA: Diagnosis not present

## 2020-05-23 DIAGNOSIS — G259 Extrapyramidal and movement disorder, unspecified: Secondary | ICD-10-CM | POA: Diagnosis present

## 2020-05-23 DIAGNOSIS — E559 Vitamin D deficiency, unspecified: Secondary | ICD-10-CM | POA: Diagnosis present

## 2020-05-23 DIAGNOSIS — G20 Parkinson's disease: Secondary | ICD-10-CM | POA: Diagnosis not present

## 2020-05-23 DIAGNOSIS — E870 Hyperosmolality and hypernatremia: Secondary | ICD-10-CM | POA: Diagnosis not present

## 2020-05-23 DIAGNOSIS — Z66 Do not resuscitate: Secondary | ICD-10-CM | POA: Diagnosis not present

## 2020-05-23 LAB — BASIC METABOLIC PANEL
Anion gap: 10 (ref 5–15)
BUN: 34 mg/dL — ABNORMAL HIGH (ref 8–23)
CO2: 24 mmol/L (ref 22–32)
Calcium: 8.7 mg/dL — ABNORMAL LOW (ref 8.9–10.3)
Chloride: 105 mmol/L (ref 98–111)
Creatinine, Ser: 1.58 mg/dL — ABNORMAL HIGH (ref 0.61–1.24)
GFR, Estimated: 44 mL/min — ABNORMAL LOW (ref >60)
Glucose, Bld: 88 mg/dL (ref 70–99)
Potassium: 3.9 mmol/L (ref 3.5–5.1)
Sodium: 139 mmol/L (ref 135–145)

## 2020-05-23 LAB — URINALYSIS, COMPLETE (UACMP) WITH MICROSCOPIC
Bilirubin Urine: NEGATIVE
Glucose, UA: NEGATIVE mg/dL
Ketones, ur: NEGATIVE mg/dL
Leukocytes,Ua: NEGATIVE
Nitrite: NEGATIVE
Protein, ur: NEGATIVE mg/dL
Specific Gravity, Urine: 1.019 (ref 1.005–1.030)
Squamous Epithelial / HPF: NONE SEEN (ref 0–5)
pH: 5 (ref 5.0–8.0)

## 2020-05-23 LAB — PROCALCITONIN: Procalcitonin: 0.2 ng/mL

## 2020-05-23 MED ORDER — ONDANSETRON HCL 4 MG/2ML IJ SOLN
4.0000 mg | Freq: Four times a day (QID) | INTRAMUSCULAR | Status: DC | PRN
  Administered 2020-05-23 – 2020-05-25 (×2): 4 mg via INTRAVENOUS
  Filled 2020-05-23 (×2): qty 2

## 2020-05-23 MED ORDER — DONEPEZIL HCL 5 MG PO TABS
10.0000 mg | ORAL_TABLET | Freq: Every day | ORAL | Status: DC
  Administered 2020-05-23 – 2020-06-02 (×12): 10 mg via ORAL
  Filled 2020-05-23 (×11): qty 2

## 2020-05-23 MED ORDER — ASCORBIC ACID 500 MG PO TABS
500.0000 mg | ORAL_TABLET | Freq: Every day | ORAL | Status: DC
  Administered 2020-05-23 – 2020-06-02 (×8): 500 mg via ORAL
  Filled 2020-05-23 (×11): qty 1

## 2020-05-23 MED ORDER — ENOXAPARIN SODIUM 40 MG/0.4ML ~~LOC~~ SOLN
40.0000 mg | SUBCUTANEOUS | Status: DC
  Administered 2020-05-23 – 2020-05-31 (×8): 40 mg via SUBCUTANEOUS
  Filled 2020-05-23 (×8): qty 0.4

## 2020-05-23 MED ORDER — LEVOTHYROXINE SODIUM 50 MCG PO TABS
50.0000 ug | ORAL_TABLET | Freq: Every day | ORAL | Status: DC
  Administered 2020-05-23 – 2020-06-04 (×11): 50 ug via ORAL
  Filled 2020-05-23 (×11): qty 1

## 2020-05-23 MED ORDER — MAGNESIUM HYDROXIDE 400 MG/5ML PO SUSP
30.0000 mL | Freq: Every day | ORAL | Status: DC | PRN
  Administered 2020-05-31: 30 mL via ORAL
  Filled 2020-05-23 (×2): qty 30

## 2020-05-23 MED ORDER — VITAMIN D3 25 MCG (1000 UNIT) PO TABS
5000.0000 [IU] | ORAL_TABLET | Freq: Every day | ORAL | Status: DC
  Administered 2020-05-23 – 2020-06-02 (×8): 5000 [IU] via ORAL
  Filled 2020-05-23 (×21): qty 5

## 2020-05-23 MED ORDER — VITAMIN B-12 5000 MCG SL SUBL: SUBLINGUAL_TABLET | Freq: Every day | SUBLINGUAL | Status: DC

## 2020-05-23 MED ORDER — SODIUM CHLORIDE 0.9 % IV SOLN
200.0000 mg | Freq: Once | INTRAVENOUS | Status: AC
  Administered 2020-05-23: 200 mg via INTRAVENOUS
  Filled 2020-05-23: qty 200

## 2020-05-23 MED ORDER — DILTIAZEM HCL 25 MG/5ML IV SOLN
10.0000 mg | INTRAVENOUS | Status: DC | PRN
  Filled 2020-05-23: qty 5

## 2020-05-23 MED ORDER — VITAMIN B-12 1000 MCG PO TABS
5000.0000 ug | ORAL_TABLET | Freq: Every day | ORAL | Status: DC
  Administered 2020-05-23 – 2020-06-02 (×8): 5000 ug via ORAL
  Filled 2020-05-23 (×12): qty 5

## 2020-05-23 MED ORDER — FAMOTIDINE 20 MG PO TABS
20.0000 mg | ORAL_TABLET | Freq: Two times a day (BID) | ORAL | Status: DC
  Administered 2020-05-23 – 2020-05-25 (×5): 20 mg via ORAL
  Filled 2020-05-23 (×5): qty 1

## 2020-05-23 MED ORDER — HYDRALAZINE HCL 20 MG/ML IJ SOLN
10.0000 mg | INTRAMUSCULAR | Status: DC | PRN
  Administered 2020-05-23 – 2020-06-01 (×3): 10 mg via INTRAVENOUS
  Filled 2020-05-23 (×3): qty 1

## 2020-05-23 MED ORDER — SODIUM CHLORIDE 0.9 % IV SOLN: INTRAVENOUS | Status: DC

## 2020-05-23 MED ORDER — GUAIFENESIN-DM 100-10 MG/5ML PO SYRP: 10.0000 mL | ORAL_SOLUTION | ORAL | Status: DC | PRN

## 2020-05-23 MED ORDER — ASPIRIN EC 81 MG PO TBEC
81.0000 mg | DELAYED_RELEASE_TABLET | Freq: Every day | ORAL | Status: DC
  Administered 2020-05-23 – 2020-06-02 (×10): 81 mg via ORAL
  Filled 2020-05-23 (×12): qty 1

## 2020-05-23 MED ORDER — ZINC SULFATE 220 (50 ZN) MG PO CAPS
220.0000 mg | ORAL_CAPSULE | Freq: Every day | ORAL | Status: DC
  Administered 2020-05-23 – 2020-06-02 (×8): 220 mg via ORAL
  Filled 2020-05-23 (×11): qty 1

## 2020-05-23 MED ORDER — MIRTAZAPINE 15 MG PO TABS
45.0000 mg | ORAL_TABLET | Freq: Every day | ORAL | Status: DC
  Administered 2020-05-23 – 2020-05-29 (×8): 45 mg via ORAL
  Filled 2020-05-23 (×8): qty 3

## 2020-05-23 MED ORDER — ONDANSETRON HCL 4 MG PO TABS: 4.0000 mg | ORAL_TABLET | Freq: Four times a day (QID) | ORAL | Status: DC | PRN

## 2020-05-23 MED ORDER — TRAZODONE HCL 50 MG PO TABS
25.0000 mg | ORAL_TABLET | Freq: Every evening | ORAL | Status: DC | PRN
  Administered 2020-05-23 – 2020-05-24 (×2): 25 mg via ORAL
  Filled 2020-05-23 (×2): qty 1

## 2020-05-23 MED ORDER — HYDROCOD POLST-CPM POLST ER 10-8 MG/5ML PO SUER
5.0000 mL | Freq: Two times a day (BID) | ORAL | Status: DC | PRN
  Administered 2020-05-24: 5 mL via ORAL
  Filled 2020-05-23: qty 5

## 2020-05-23 MED ORDER — SODIUM CHLORIDE 0.9 % IV SOLN
100.0000 mg | Freq: Every day | INTRAVENOUS | Status: AC
  Administered 2020-05-24 – 2020-05-27 (×4): 100 mg via INTRAVENOUS
  Filled 2020-05-23: qty 100
  Filled 2020-05-23 (×3): qty 20

## 2020-05-23 MED ORDER — CARBIDOPA-LEVODOPA 25-100 MG PO TABS
0.5000 | ORAL_TABLET | Freq: Three times a day (TID) | ORAL | Status: DC
  Administered 2020-05-23 – 2020-06-04 (×33): 0.5 via ORAL
  Filled 2020-05-23 (×42): qty 0.5

## 2020-05-23 MED ORDER — MELATONIN 5 MG PO TABS
10.0000 mg | ORAL_TABLET | Freq: Every day | ORAL | Status: DC
  Administered 2020-05-23 – 2020-05-29 (×7): 10 mg via ORAL
  Filled 2020-05-23 (×8): qty 2

## 2020-05-23 MED ORDER — ACETAMINOPHEN 325 MG PO TABS
650.0000 mg | ORAL_TABLET | Freq: Four times a day (QID) | ORAL | Status: DC | PRN
  Administered 2020-05-23: 650 mg via ORAL
  Filled 2020-05-23: qty 2

## 2020-05-23 MED ORDER — LORATADINE 10 MG PO TABS: 10.0000 mg | ORAL_TABLET | Freq: Every day | ORAL | Status: DC | PRN

## 2020-05-23 NOTE — Care Management (Addendum)
New onset narrow complex tachycardia.  HR elevated potentially up to 170s but may be affected by underlying tremor.  I responded to bedside, patient asymptomatic.  BP stable.  Suspect new onset atrial fib.   Plan: Continue to monitor on telemetry PRN ekg chest pain or tachycardia  Diltiazem 10mg  IV prn for sustained HR elevation 2D echo If tachycardia is persistent, may need transfer to PCU for gtt Can consider amio, metoprolol pushes if further HR control is needed Will consult cards in AM for further recommendations and management  Ralene Muskrat MD

## 2020-05-23 NOTE — H&P (Signed)
[place]   PATIENT NAME: Blake Bates    MR#:  [id number]  DATE OF BIRTH:  [birth year]  DATE OF ADMISSION:  05/22/2020  PRIMARY CARE PHYSICIAN: [name], DO   REQUESTING/REFERRING PHYSICIAN: Hinda Kehr, MD  CHIEF COMPLAINT:  Confusion  HISTORY OF PRESENT ILLNESS:  Blake Bates  is a 80 y.o. Caucasian male with a known history of asthma, dyslipidemia, hypothyroidism, Lewy body dementia and Parkinson's disease, who presented to the emergency room with acute onset of altered mental status with delirium and confusion that started today.  He is usually able to ambulate with a walker at his baseline and today he was feeling so weak he could not walk.  The patient has been vaccinated against [id number] and had his booster shot on 02/07/2020.  He has been having cough productive of clear sputum and rhinorrhea without wheezing or dyspnea.  No nausea or vomiting or diarrhea.  No loss of taste or smell.  No dysuria, oliguria or hematuria or flank pain.  Upon presentation to the ER, temperature was 111.3 and heart rate was within normal and briefly 124 with respiratory rate that was normal and later 24.  Labs revealed mild hyponatremia and a BUN of 38 with creatinine 1.61 better than previous levels and CBC showed mild anemia close to baseline.  UA showed 6-10 WBCs rare bacteria and hyaline casts with negative nitrites.  Urine culture was sent.  COVID-19 PCR came back positive.  The patient was given 650 mg p.o. Tylenol, IV remdesivir and 500 mill IV normal saline bolus.  He will be admitted to a medical monitored isolation bed for further evaluation and management.  PAST MEDICAL HISTORY:   Past Medical History:  Diagnosis Date  . Asthma   . Diverticulosis   . Full dentures   . Hyperlipidemia   . Hypothyroidism    s/p subtotal thyroidectomy  . Muscle spasms of lower extremity    Right upper leg  . Osteoporosis   . Rhinitis, allergic    pollens, mold, animal  dander  Stage IIIb chronic kidney disease  PAST SURGICAL HISTORY:   Past Surgical History:  Procedure Laterality Date  . CATARACT EXTRACTION W/PHACO Left 09/20/2014   Procedure: CATARACT EXTRACTION PHACO AND INTRAOCULAR LENS PLACEMENT (IOC);  Surgeon: Leandrew Koyanagi, MD;  Location: [facility];  Service: Ophthalmology;  Laterality: Left;  . COLONOSCOPY    . PARATHYROIDECTOMY    . THYROID SURGERY     subtotal    SOCIAL HISTORY:   Social History   Tobacco Use  . Smoking status: Former Smoker    Packs/day: 0.00    Years: 60.00    Pack years: 0.00    Types: Pipe    Quit date: 05/12/2016    Years since quitting: 4.0  . Smokeless tobacco: Former Systems developer  . Tobacco comment: Self taper down from full pipe daily down to half to quarter pipe  Substance Use Topics  . Alcohol use: Not Currently    Comment: occasionally beer or wine     FAMILY HISTORY:   Family History  Problem Relation Age of Onset  . Stroke Mother 11  . Lung cancer Father 28  . Prostate cancer Paternal Grandfather 18  . Stroke Paternal Aunt     DRUG ALLERGIES:   Allergies  Allergen Reactions  . Mold Extract  [Trichophyton]   . Morphine Anxiety and Other (See Comments)    REVIEW OF SYSTEMS:   ROS As per history of present  illness. All pertinent systems were reviewed above. Constitutional, HEENT, cardiovascular, respiratory, GI, GU, musculoskeletal, neuro, psychiatric, endocrine, integumentary and hematologic systems were reviewed and are otherwise negative/unremarkable except for positive findings mentioned above in the HPI.   MEDICATIONS AT HOME:   Prior to Admission medications   Medication Sig Start Date End Date Taking? Authorizing Provider  Acetaminophen (TYLENOL ARTHRITIS PAIN PO) Take by mouth. Once a day    [provider]  albuterol (PROVENTIL HFA;VENTOLIN HFA) 108 (90 BASE) MCG/ACT inhaler Inhale into the lungs every 6 (six) hours as needed for wheezing or shortness of  breath.    [provider]  carbidopa-levodopa (SINEMET IR) 25-100 MG tablet Take 1/2 tablet three times a day with meals 03/06/20   Cameron Sprang, MD  Cholecalciferol (VITAMIN D-3 PO) Take 5,000 Units/oz/day by mouth daily. PM     [provider]  Cyanocobalamin (VITAMIN B-12) 5000 MCG SUBL Place under the tongue daily.    [provider]  donepezil (ARICEPT) 10 MG tablet Take 1 tablet daily 03/06/20   Cameron Sprang, MD  levothyroxine (SYNTHROID) 50 MCG tablet TAKE 1 TABLET(50 MCG) BY MOUTH DAILY BEFORE BREAKFAST 04/07/20   Karamalegos, Devonne Doughty, DO  loratadine (CLARITIN) 10 MG tablet Take 10 mg by mouth daily as needed for allergies. PM    [provider]  meloxicam (MOBIC) 15 MG tablet TAKE 1 TABLET BY MOUTH DAILY AS NEEDED FOR PAIN AND ARTHRITIS FLARE, TAKE UP TO 1 TO 2 WEEKS AT A TIME 12/28/19   Parks Ranger, Devonne Doughty, DO  mirtazapine (REMERON) 45 MG tablet Take 1 tablet (45 mg total) by mouth at bedtime. 04/10/20   Cameron Sprang, MD  neomycin-polymyxin-pramoxine (NEOSPORIN PLUS) 1 % cream Apply topically 2 (two) times daily. 10/06/19   Sable Feil, PA-C      VITAL SIGNS:  Blood pressure 106/75, pulse 75, temperature 98.2 F (36.8 C), temperature source Oral, resp. rate 20, height 5\' 11"  (1.803 m), weight 56.7 kg, SpO2 98 %.  PHYSICAL EXAMINATION:  Physical Exam  GENERAL:  80 y.o.-year-old Caucasian male patient lying in the bed with no acute distress.  EYES: Pupils equal, round, reactive to light and accommodation. No scleral icterus. Extraocular muscles intact.  HEENT: Head atraumatic, normocephalic. Oropharynx and nasopharynx clear.  NECK:  Supple, no jugular venous distention. No thyroid enlargement, no tenderness.  LUNGS: Normal breath sounds bilaterally, no wheezing, rales,rhonchi or crepitation. No use of accessory muscles of respiration.  CARDIOVASCULAR: Regular rate and rhythm, S1, S2 normal. No murmurs, rubs, or gallops.   ABDOMEN: Soft, nondistended, nontender. Bowel sounds present. No organomegaly or mass.  EXTREMITIES: No pedal edema, cyanosis, or clubbing.  NEUROLOGIC: Cranial nerves II through XII are intact. Muscle strength 5/5 in all extremities. Sensation intact. Gait not checked.  PSYCHIATRIC: The patient is alert and cooperative.  Normal affect and good eye contact. SKIN: No obvious rash, lesion, or ulcer.   LABORATORY PANEL:   CBC Recent Labs  Lab 05/22/20 1805  WBC 7.3  HGB 11.9*  HCT 36.9*  PLT 194   ------------------------------------------------------------------------------------------------------------------  Chemistries  Recent Labs  Lab 05/22/20 1805  NA 134*  K 4.1  CL 100  CO2 27  GLUCOSE 105*  BUN 38*  CREATININE 1.61*  CALCIUM 9.0  AST 27  ALT 21  ALKPHOS 82  BILITOT 0.8   ------------------------------------------------------------------------------------------------------------------  Cardiac Enzymes No results for input(s): TROPONINI in the last 168 hours. ------------------------------------------------------------------------------------------------------------------  RADIOLOGY:  DG Chest 2 View  Result Date:  05/22/2020 CLINICAL DATA:  Weakness EXAM: CHEST - 2 VIEW COMPARISON:  November 24, 2017 FINDINGS: Cardiomediastinal contours and hilar structures are normal. Biapical pleural and parenchymal scarring as before. No lobar consolidation.  No sign of pleural effusion. Osteopenia.  Spinal degenerative changes. Midthoracic vertebral compression fracture that is new compared to November 24, 2017 approximately 40% loss of height. IMPRESSION: 1. No active cardiopulmonary disease. 2. Midthoracic vertebral compression fracture that is new compared to November 24, 2017 approximately 40% loss of height. This is age indeterminate but new compared to this remote prior exam. Correlate with any point tenderness in this location to determine whether this could represent an acute  compression fracture. Electronically Signed   By: Zetta Bills M.D.   On: 05/22/2020 18:52   CT HEAD WO CONTRAST  Result Date: 05/22/2020 CLINICAL DATA:  Headache weakness EXAM: CT HEAD WITHOUT CONTRAST TECHNIQUE: Contiguous axial images were obtained from the base of the skull through the vertex without intravenous contrast. COMPARISON:  CT 10/06/2019, MRI 05/12/2019 FINDINGS: Brain: No acute territorial infarction, hemorrhage or new intracranial mass. Bilateral right greater than left parietal convexity CSF densities corresponding to arachnoid cysts on previous MRI. Advanced atrophy. Mild chronic small vessel ischemic change of the white matter. Stable degree of lateral and third ventricular dilatation. Vascular: No hyperdense vessels.  Carotid vascular calcification. Skull: Normal. Negative for fracture or focal lesion. Sinuses/Orbits: Opacified right maxillary sinus. Mucosal disease in the ethmoid sinuses Other: None IMPRESSION: 1. No CT evidence for acute intracranial abnormality. 2. Atrophy and mild chronic small vessel ischemic change of the white matter. 3. Stable degree of ventricular enlargement. Grossly stable bilateral parietal arachnoid cysts. Electronically Signed   By: Donavan Foil M.D.   On: 05/22/2020 18:48      IMPRESSION AND PLAN:   1.  COVID-19 infection with subsequent sepsis as manifested by fever and brief tachycardia and tachypnea, possibly with severe sepsis due to altered mental status/acute encephalopathy with delirium and confusion. - The patient will be admitted to a medical monitored isolation bed. - He will be hydrated with IV normal saline. - We will continue him on IV remdesivir. - At this time he does not need steroids or immunomodulators due to normal pulse oximetry. - We will follow inflammatory markers. - Neurochecks will be followed every 4 hours for 24 hours. -His altered mental status is likely metabolic and possibly related to his fever.  2.  Lewy body  dementia that could be with behavioral changes due to COVID-19. - We will continue his Aricept. - Will use as needed IM Haldol.  3.  Parkinson's disease. - We will continue his Sinemet.  4.  Hypothyroidism. - We will continue his Synthroid and check TSH.  5.  Vitamin D and vitamin B12 deficiency. - We will continue both vitamin D and vitamin B12.  6.  Stage IIIb chronic kidney disease, with stable BUN and creatinine. - He has been hydrated with IV normal saline and will follow his BMP.  7.  DVT prophylaxis -Subcutaneous Lovenox.  All the records are reviewed and case discussed with ED provider. The plan of care was discussed in details with the patient's wife.   I answered all questions. The patient agreed to proceed with the above mentioned plan. Further management will depend upon hospital course.   CODE STATUS: Full code  Status is: Inpatient  Remains inpatient appropriate because:Altered mental status, Ongoing diagnostic testing needed not appropriate for outpatient work up, Unsafe d/c plan, IV treatments appropriate due  to intensity of illness or inability to take PO and Inpatient level of care appropriate due to severity of illness   Dispo: The patient is from: Home              Anticipated d/c is to: Home              Anticipated d/c date is: 3 days              Patient currently is not medically stable to d/c.   TOTAL TIME TAKING CARE OF THIS PATIENT: 55 minutes.    Christel Mormon M.D on 05/23/2020 at 2:06 AM  [facility]   From 7 PM-7 AM, contact night-coverage [email or web address]  CC: Primary care physician; [name], DO

## 2020-05-23 NOTE — Progress Notes (Deleted)
OT Cancellation Note  Patient Details Name: Blake Bates MRN: [id number] DOB: [birth year]   Cancelled Treatment:    Reason Eval/Treat Not Completed: Medical issues which prohibited therapy. OT order received and chart reviewed. Pt continues to be febrile with most recent temperature of 103. OT will hold until pt is able to actively participate in OT intervention.  Darleen Crocker, [place], OTR/L , CBIS ascom [phone]  05/23/20, 8:55 AM   05/23/2020, 8:53 AM

## 2020-05-23 NOTE — ED Notes (Signed)
Pt repositioned in bed. External catheter placed on pt

## 2020-05-23 NOTE — Progress Notes (Signed)
PT Cancellation Note  Patient Details Name: Blake Bates MRN: [id number] DOB: 09-28-1940   Cancelled Treatment:    Reason Eval/Treat Not Completed: Patient's level of consciousness (Per OT, pt c AMS, still very confused, participation limited. WIll attempt evaluation at later date/time as appropriate.)   Lajuanda Penick C 05/23/2020, 4:50 PM

## 2020-05-23 NOTE — Progress Notes (Signed)
Please hospitalist update note.  This is a nonbillable note.  Please see same-day H&P from Dr. Sidney Ace for full billable details.  Briefly, this is a 80 year old Caucasian male history of Parkinson's and Lewy body dementia who presented to the emergency room with acute onset of altered mentation and weakness.  Per the patient's wife his change occurred very acutely.  At baseline he is able to ambulate with a walker.  Today he was so weak he could barely get up out of bed.  The patient has been vaccinated against [id number] and had a booster shot on 02/07/2020.  On admission he was found to be COVID-positive.  No pneumonia noted on chest x-ray.  No oxygen requirement.  Patient had one-time fever of 101.3.  Afebrile since.  He was started on remdesivir which we will continue.  Checking procalcitonin to evaluate for bacterial coinfection.  We will start empiric antibiotics if procalcitonin elevated.  Continue Sinemet for Parkinson's.  Therapy evaluations requested.  Updated patient's wife Blake Bates over phone.  Ralene Muskrat MD

## 2020-05-23 NOTE — ED Notes (Signed)
Attempted to call pt's wife per his request, unable to reach her at this time. Left voicemail with callback number

## 2020-05-23 NOTE — Progress Notes (Signed)
Remdesivir - Pharmacy Brief Note   O:  CXR :Biapical pleural and parenchymal scarring as before.No lobar consolidation.  No sign of pleural effusion. SpO2: 96-98% on RA   A/P:  Remdesivir 200 mg IVPB once followed by 100 mg IVPB daily x 4 days.   Renda Rolls, PharmD, [facility] 05/23/2020 1:46 AM

## 2020-05-23 NOTE — Evaluation (Signed)
Occupational Therapy Evaluation Patient Details Name: Blake Bates MRN: [id number] DOB: Jul 25, 1940 Today's Date: 05/23/2020    History of Present Illness 80 y.o. Caucasian male with a known history of asthma, dyslipidemia, hypothyroidism, Lewy body dementia and Parkinson's disease, who presented to the emergency room with acute onset of altered mental status with delirium and confusion that started today.  He is usually able to ambulate with a walker at his baseline and today he was feeling so weak he could not walk.  The patient has been vaccinated against [id number] and had his booster shot on 02/07/2020.  He has been having cough productive of clear sputum and rhinorrhea without wheezing or dyspnea.  No nausea or vomiting or diarrhea.  No loss of taste or smell.  No dysuria, oliguria or hematuria or flank pain.   Clinical Impression   Patient presenting with decreased I in self care, balance, functional mobility/transfer, endurance, and safety awareness. Pt's wife present to describe pt's baseline.  Patient lives with wife and an additional caregiver PTA. Pt typically utilizes a RW for functional mobility but sometimes is able to ambulate without. He sits on toilet for bathing and dressing assistance reported at mod A level. Pt very cognitively impaired and needing mod - max multimodal cuing for initiation, sequencing, and attending to task. Pt hyperfocused on eating cheerios and needing increased cuing to attend to tasks. Max A to get to EOB and total A to stand pt while RN changed bed lines secondary to incontinence. Pt then unable to follow any commands and began flailing UEs on EOB and reaching up towards therapist face grabbing at face shield. Total A to return to bed and +2 to reposition pt for comfort. OT discussed recommendation of SNF based on pt's level of assist at this time. Wife is agreeable to hopes pt will be able to improve and return home. Patient will benefit from acute OT to  increase overall independence in the areas of ADLs, functional mobility, and safety awareness in order to safely discharge home to next venue of care.    Follow Up Recommendations  SNF;Supervision/Assistance - 24 hour    Equipment Recommendations  Other (comment) (defer to next venue of care)       Precautions / Restrictions Precautions Precautions: Fall      Mobility Bed Mobility Overal bed mobility: Needs Assistance Bed Mobility: Supine to Sit;Sit to Supine     Supine to sit: Max assist Sit to supine: Total assist   General bed mobility comments: Pt needing max A for trunk and B LEs to get to EOB. Pt very fearful once on EOB and began reaching out towards therapist face and after standing to change bed linens required total A to return to bed for safety.    Transfers Overall transfer level: Needs assistance Equipment used: None Transfers: Sit to/from Stand Sit to Stand: Total assist         General transfer comment: Pt providing less than 10% of assistance and very resistive    Balance Overall balance assessment: Needs assistance Sitting-balance support: Feet supported Sitting balance-Leahy Scale: Fair Sitting balance - Comments: min guard - min A secondary to anxiety once sitting up     Standing balance-Leahy Scale: Zero Standing balance comment: total A to stand pt                           ADL either performed or assessed with clinical judgement   ADL Overall  ADL's : Needs assistance/impaired Eating/Feeding: Minimal assistance;Bed level                   Lower Body Dressing: Maximal assistance;Bed level                 General ADL Comments: Pt incontient of urine in bed and therapist holding pt up with RN changing bed linens.     Vision Baseline Vision/History: Wears glasses Wears Glasses: At all times Patient Visual Report: No change from baseline              Pertinent Vitals/Pain Pain Assessment: Faces Faces Pain  Scale: Hurts little more Pain Location: generalized - pt unable to verbalize Pain Descriptors / Indicators: Discomfort Pain Intervention(s): Limited activity within patient's tolerance;Monitored during session;Repositioned     Hand Dominance Right   Extremity/Trunk Assessment Upper Extremity Assessment Upper Extremity Assessment: Generalized weakness;Overall WFL for tasks assessed (unable to follow formal exam)   Lower Extremity Assessment Lower Extremity Assessment: Overall WFL for tasks assessed;Generalized weakness       Communication Communication Communication: No difficulties   Cognition Arousal/Alertness: Awake/alert Behavior During Therapy: Flat affect Overall Cognitive Status: History of cognitive impairments - at baseline                                 General Comments: Pt follows 1 step commands inconsistently with mod - max multimodal cuing. Pt unable to attend to task and is hyperfocused this session on eating cherrios. Pt oriented to self only.              Home Living Family/patient expects to be discharged to:: Private residence Living Arrangements: Spouse/significant other;Non-relatives/Friends Available Help at Discharge: Family;Personal care attendant;Available 24 hours/day Type of Home: House Home Access: Stairs to enter [employer] of Steps: 5 Entrance Stairs-Rails: Right;Left Home Layout: One level               Home Equipment: Walker - 2 wheels;Wheelchair - manual          Prior Functioning/Environment Level of Independence: Needs assistance  Gait / Transfers Assistance Needed: Pt ambulates with RW at baseline with supervision per wife's report. She reports level of assistance can vary and sometimes he ambulates without use of RW ADL's / Homemaking Assistance Needed: Pt has caregiver that also lives in the home to assist pt's wife. Pt sits on commode while they assist with bathing and dressing mod A.             OT Problem List: Decreased strength;Decreased knowledge of use of DME or AE;Decreased range of motion;Decreased activity tolerance;Decreased cognition;Impaired balance (sitting and/or standing);Decreased safety awareness      OT Treatment/Interventions: Self-care/ADL training;Therapeutic exercise;Patient/family education;Energy conservation;DME and/or AE instruction;Cognitive remediation/compensation;Balance training;Therapeutic activities    OT Goals(Current goals can be found in the care plan section) Acute Rehab OT Goals Patient Stated Goal: to return home if able OT Goal Formulation: With family Time For Goal Achievement: 06/06/20 Potential to Achieve Goals: Fair ADL Goals Pt Will Perform Grooming: with supervision;standing Pt Will Transfer to Toilet: with supervision;ambulating Pt Will Perform Toileting - Clothing Manipulation and hygiene: with supervision;sit to/from stand  OT Frequency: Min 1X/week   Barriers to D/C:    none known at this time          AM-PAC OT "6 Clicks" Daily Activity     Outcome Measure Help from another person eating meals?: A  Little Help from another person taking care of personal grooming?: A Lot Help from another person toileting, which includes using toliet, bedpan, or urinal?: Total Help from another person bathing (including washing, rinsing, drying)?: Total Help from another person to put on and taking off regular upper body clothing?: A Lot Help from another person to put on and taking off regular lower body clothing?: Total 6 Click Score: 10   End of Session Nurse Communication: Mobility status  Activity Tolerance: Other (comment) (limited secondary to cognition) Patient left: in bed;with call bell/phone within reach;with family/visitor present  OT Visit Diagnosis: Unsteadiness on feet (R26.81);Muscle weakness (generalized) (M62.81)                Time: 7628-3151 OT Time Calculation (min): 24 min Charges:  OT General Charges $OT  Visit: 1 Visit OT Evaluation $OT Eval Moderate Complexity: 1 Mod OT Treatments $Self Care/Home Management : 8-22 mins  Darleen Crocker, MS, OTR/L , CBIS ascom [phone]  05/23/20, 2:22 PM

## 2020-05-23 NOTE — Progress Notes (Signed)
PT Cancellation Note  Patient Details Name: Blake Bates MRN: [id number] DOB: [birth year]   Cancelled Treatment:    Reason Eval/Treat Not Completed: Medical issues which prohibited therapy (Per chart review, pt febrile at present which precludes PT evaluation. Will defer to later date/time as medically appropriate. Pt familiar to our services from [place] '21.)   Spruha Weight C 05/23/2020, 9:01 AM

## 2020-05-24 ENCOUNTER — Inpatient Hospital Stay
Admit: 2020-05-24 | Discharge: 2020-05-24 | Disposition: A | Discharge status: Home / Self Care | Payer: Medicare Other | Attending: Internal Medicine | Admitting: Internal Medicine

## 2020-05-24 DIAGNOSIS — U071 COVID-19: Secondary | ICD-10-CM | POA: Diagnosis not present

## 2020-05-24 LAB — BASIC METABOLIC PANEL
Anion gap: 14 (ref 5–15)
BUN: 29 mg/dL — ABNORMAL HIGH (ref 8–23)
CO2: 22 mmol/L (ref 22–32)
Calcium: 9.1 mg/dL (ref 8.9–10.3)
Chloride: 105 mmol/L (ref 98–111)
Creatinine, Ser: 1.59 mg/dL — ABNORMAL HIGH (ref 0.61–1.24)
GFR, Estimated: 44 mL/min — ABNORMAL LOW (ref >60)
Glucose, Bld: 110 mg/dL — ABNORMAL HIGH (ref 70–99)
Potassium: 4.6 mmol/L (ref 3.5–5.1)
Sodium: 141 mmol/L (ref 135–145)

## 2020-05-24 LAB — CBC WITH DIFFERENTIAL/PLATELET
Abs Immature Granulocytes: 0.02 10*3/uL (ref 0.00–0.07)
Basophils Absolute: 0 10*3/uL (ref 0.0–0.1)
Basophils Relative: 0 %
Eosinophils Absolute: 0 10*3/uL (ref 0.0–0.5)
Eosinophils Relative: 0 %
HCT: 40.2 % (ref 39.0–52.0)
Hemoglobin: 13 g/dL (ref 13.0–17.0)
Immature Granulocytes: 0 %
Lymphocytes Relative: 9 %
Lymphs Abs: 0.6 10*3/uL — ABNORMAL LOW (ref 0.7–4.0)
MCH: 29.6 pg (ref 26.0–34.0)
MCHC: 32.3 g/dL (ref 30.0–36.0)
MCV: 91.6 fL (ref 80.0–100.0)
Monocytes Absolute: 0.8 10*3/uL (ref 0.1–1.0)
Monocytes Relative: 12 %
Neutro Abs: 5.5 10*3/uL (ref 1.7–7.7)
Neutrophils Relative %: 79 %
Platelets: 197 10*3/uL (ref 150–400)
RBC: 4.39 MIL/uL (ref 4.22–5.81)
RDW: 14 % (ref 11.5–15.5)
WBC: 6.9 10*3/uL (ref 4.0–10.5)
nRBC: 0 % (ref 0.0–0.2)

## 2020-05-24 LAB — URINE CULTURE
Culture: NO GROWTH
Special Requests: NORMAL

## 2020-05-24 LAB — ECHOCARDIOGRAM COMPLETE
AR max vel: 1.69 cm2
AV Area VTI: 1.81 cm2
AV Area mean vel: 1.76 cm2
AV Mean grad: 8 mmHg
AV Peak grad: 15.7 mmHg
Ao pk vel: 1.98 m/s
Area-P 1/2: 3.4 cm2
Height: 71 in
MV VTI: 2.11 cm2
P 1/2 time: 545 msec
S' Lateral: 2.6 cm
Weight: 2000 oz

## 2020-05-24 LAB — C-REACTIVE PROTEIN: CRP: 8.1 mg/dL — ABNORMAL HIGH (ref <1.0)

## 2020-05-24 LAB — PROCALCITONIN: Procalcitonin: 0.16 ng/mL

## 2020-05-24 NOTE — TOC Initial Note (Signed)
Transition of Care [facility]) - Initial/Assessment Note    Patient Details  Name: Blake Bates MRN: [id number] Date of Birth: May 15, 1940  Transition of Care [facility]) CM/SW Contact:    Shelbie Hutching, RN Phone Number: 05/24/2020, 3:32 PM  Clinical Narrative:                 Patient admitted to the hospital with [name]. RNCM was able to speak with patient's wife, Blake Bates, via phone.  Patient is from home with wife, she is his caregiver at home.  Patient can walk with a walker and feed himself, wife helps with dressing.  Patient has an aide that comes in 2 times per week and stays for 3 hours and helps him bath.  Aide comes in Thursdays and Fridays.  Wife provides transportation.  Patient is current with his PCP in [place] and Pharmacy is [facility].  PT is recommending SNF for short term rehab and Blake Bates agrees.  RNCM will start bed search.  Blake Bates is aware that bed search will extend into [place] area.     Expected Discharge Plan: Skilled Nursing Facility Barriers to Discharge: Continued Medical Work up   Patient Goals and CMS Choice Patient states their goals for this hospitalization and ongoing recovery are:: Wife would like for the patient to go to Short term rehab to get stronger CMS Medicare.gov Compare Post Acute Care list provided to:: Patient Choice offered to / list presented to : Patient  Expected Discharge Plan and Services Expected Discharge Plan: [name]   Discharge Planning Services: CM Consult Post Acute Care Choice: [name] Living arrangements for the past 2 months: Single Family Home                                      Prior Living Arrangements/Services Living arrangements for the past 2 months: Single Family Home Lives with:: Spouse Patient language and need for interpreter reviewed:: Yes Do you feel safe going back to the place where you live?: Yes      Need for Family Participation in Patient Care: Yes (Comment) (COVID,  dementia) Care giver support system in place?: Yes (comment) (wife) Current home services: DME,Homehealth aide (rolling walker, hired Teaching laboratory technician 2 x per week) Criminal Activity/Legal Involvement Pertinent to Current Situation/Hospitalization: No - Comment as needed  Activities of Daily Living      Permission Sought/Granted Permission sought to share information with : Case Manager,Family Chief Financial Officer Permission granted to share information with : Yes, Verbal Permission Granted  Share Information with NAME: Blake Bates  Permission granted to share info w AGENCY: SNF's  Permission granted to share info w Relationship: wife     Emotional Assessment       Orientation: : Oriented to Self Alcohol / Substance Use: Not Applicable Psych Involvement: No (comment)  Admission diagnosis:  Dehydration [E86.0] Delirium [R41.0] Weakness [R53.1] Parkinsonian features [R25.9] Unable to ambulate [R26.2] Chronic kidney disease, unspecified CKD stage [N18.9] COVID-19 [U07.1] Patient Active Problem List   Diagnosis Date Noted  . COVID-19 05/23/2020  . Normocytic anemia 01/31/2020  . Recurrent falls 10/31/2019  . History of CVA (cerebrovascular accident) 10/31/2019  . Generalized muscle weakness 10/31/2019  . Oropharyngeal dysphagia 06/21/2019  . Moderate protein-calorie malnutrition ([place]) 06/21/2019  . Balance disorder 06/21/2019  . Left homonymous hemianopsia 06/21/2019  . Pain due to onychomycosis of toenails of both feet 01/03/2019  . Lewy body dementia  without behavioral disturbance ([place]) 07/21/2017  . Elevated PSA 12/09/2016  . Vitamin D deficiency 12/09/2016  . Chronic pain of right knee 11/04/2016  . Hyperlipidemia 11/04/2016  . CKD (chronic kidney disease), stage III ([place]) 11/04/2016  . Osteoarthritis of multiple joints 11/03/2016  . Chronic right hip pain 11/03/2016  . 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 Hypothyroidism 11/03/2014   . Asthma 11/03/2014  . Osteoporosis 11/03/2014  . Skin lesion of chest wall 11/03/2014  . Trigger finger of both hands 11/03/2014   PCP:  [name], DO Pharmacy:   [facility] [place], [address] [place] [address] Phone: [phone] Fax: [phone]     Social Determinants of Health (SDOH) Interventions    Readmission Risk Interventions No flowsheet data found.

## 2020-05-24 NOTE — Progress Notes (Addendum)
PROGRESS NOTE    Blake Bates  TOI:[id number] DOB: [birth year] DOA: 05/22/2020 PCP: [name], DO   Brief Narrative:   80 year old Caucasian male history of Parkinson's and Lewy body dementia who presented to the emergency room with acute onset of altered mentation and weakness.  Per the patient's wife his change occurred very acutely.  At baseline he is able to ambulate with a walker.  Today he was so weak he could barely get up out of bed.  The patient has been vaccinated against IPJAS-50 and had a booster shot on 02/07/2020.  On admission he was found to be COVID-positive.  No pneumonia noted on chest x-ray.  No oxygen requirement.  Patient did develop nonsustained narrow complex tachycardia, suspect SVT versus atrial fibrillation.  Unfortunately we could not capture this on twelve-lead EKG.  His neurologic symptoms are certainly acutely worsened since he has been in the hospital.  Wife is concerned about his poor level of mobility.   Assessment & Plan:   Active Problems:   COVID-19  COVID-19 infection Acute metabolic encephalopathy secondary to above Sepsis ruled out Patient is vaccinated and booster.  He has no pneumonia on chest x-ray however has had intermittent fevers.  Mental status appears to be improving but per the wife is not quite at baseline. Plan: Continue remdesivir, dose 2/5 Hold steroids at this time given lack of hypoxia and pneumonia Monitor inflammatory markers Oxygen if needed Every 4 hours neurochecks Frequent reorienting measures  Parkinson's Lewy body dementia Patient had acute deterioration in his neurologic status on the day of admission.  This is likely due to COVID infection and possibly fever.   Plan: Continue Aricept at home dose Continue Sinemet at home dose Up as tolerated with nursing and therapy Will formally consult neurology if neurologic symptoms do not improve as his infection resolves  Hypothyroidism Continue  Synthroid  Stage IIIb chronic kidney disease No IV fluids at this time Creatinine at or near baseline Will follow Chem-7  DVT prophylaxis: Lovenox Code Status: Full Family Communication: Wife at bedside Disposition Plan: Status is: Inpatient  Remains inpatient appropriate because:Altered mental status and Inpatient level of care appropriate due to severity of illness   Dispo: The patient is from: Home              Anticipated d/c is to: SNF              Anticipated d/c date is: 3 days              Patient currently is not medically stable to d/c.   Patient remains symptomatic, febrile, encephalopathic.  Likely etiology COVID-19 infection in the setting of underlying Parkinson's and Lewy body dementia.  Several days before disposition planning.      Consultants:   None  Procedures:   None  Antimicrobials:   Remdesivir   Subjective: Patient seen and examined.  Sleepy this morning.  Less tremulous while sleeping.  Wife at bedside.  Objective: Vitals:   05/23/20 1623 05/23/20 2019 05/24/20 0014 05/24/20 0528  BP: 111/80 131/69 (!) 129/103 (!) 151/71  Pulse: 76 94 71 73  Resp: 16 20 18 20   Temp: 99.4 F (37.4 C) (!) 101.4 F (38.6 C) 98.1 F (36.7 C) 98.3 F (36.8 C)  TempSrc:      SpO2: 98% 96% 100% 96%  Weight:      Height:        Intake/Output Summary (Last 24 hours) at 05/24/2020 1033 Last data filed  at 05/23/2020 1300 Gross per 24 hour  Intake 100 ml  Output --  Net 100 ml   Filed Weights   05/22/20 1802  Weight: 56.7 kg    Examination:  General exam: Tremulous Respiratory system: Clear to auscultation.  Normal work of breathing.  Room air Cardiovascular system: Tachycardic, regular rhythm, no murmurs Gastrointestinal system: Abdomen is nondistended, soft and nontender. No organomegaly or masses felt. Normal bowel sounds heard. Central nervous system: Alert, oriented x2, tremulous but no focal deficits. Extremities: Symmetrically decreased  power upper and lower extremities Skin: No rashes, lesions or ulcers Psychiatry: Judgement and insight appear impaired. Mood & affect flattened.     Data Reviewed: I have personally reviewed following labs and imaging studies  CBC: Recent Labs  Lab 05/22/20 1805  WBC 7.3  NEUTROABS 6.0  HGB 11.9*  HCT 36.9*  MCV 92.3  PLT 283   Basic Metabolic Panel: Recent Labs  Lab 05/22/20 1805 05/23/20 0423  NA 134* 139  K 4.1 3.9  CL 100 105  CO2 27 24  GLUCOSE 105* 88  BUN 38* 34*  CREATININE 1.61* 1.58*  CALCIUM 9.0 8.7*   GFR: Estimated Creatinine Clearance: 30.4 mL/min (A) (by C-G formula based on SCr of 1.58 mg/dL (H)). Liver Function Tests: Recent Labs  Lab 05/22/20 1805  AST 27  ALT 21  ALKPHOS 82  BILITOT 0.8  PROT 7.3  ALBUMIN 4.0   No results for input(s): LIPASE, AMYLASE in the last 168 hours. No results for input(s): AMMONIA in the last 168 hours. Coagulation Profile: Recent Labs  Lab 05/22/20 1805  INR 0.9   Cardiac Enzymes: No results for input(s): CKTOTAL, CKMB, CKMBINDEX, TROPONINI in the last 168 hours. BNP (last 3 results) No results for input(s): PROBNP in the last 8760 hours. HbA1C: No results for input(s): HGBA1C in the last 72 hours. CBG: No results for input(s): GLUCAP in the last 168 hours. Lipid Profile: No results for input(s): CHOL, HDL, LDLCALC, TRIG, CHOLHDL, LDLDIRECT in the last 72 hours. Thyroid Function Tests: No results for input(s): TSH, T4TOTAL, FREET4, T3FREE, THYROIDAB in the last 72 hours. Anemia Panel: No results for input(s): VITAMINB12, FOLATE, FERRITIN, TIBC, IRON, RETICCTPCT in the last 72 hours. Sepsis Labs: Recent Labs  Lab 05/22/20 1805 05/23/20 0423  PROCALCITON  --  0.20  LATICACIDVEN 1.1  --     Recent Results (from the past 240 hour(s))  Urine Culture     Status: None   Collection Time: 05/22/20 11:59 PM   Specimen: Urine, Random  Result Value Ref Range Status   Specimen Description   Final     URINE, RANDOM Performed at [facility], [address] [place], [place] [address]    Special Requests   Final    Normal Performed at [facility], [address] [place], [place] [address]    Culture   Final    NO GROWTH Performed at [facility], [place] [address], [place], [place] [address]    Report Status 05/24/2020 FINAL  Final         Radiology Studies: DG Chest 2 View  Result Date: 05/22/2020 CLINICAL DATA:  Weakness EXAM: CHEST - 2 VIEW COMPARISON:  November 24, 2017 FINDINGS: Cardiomediastinal contours and hilar structures are normal. Biapical pleural and parenchymal scarring as before. No lobar consolidation.  No sign of pleural effusion. Osteopenia.  Spinal degenerative changes. Midthoracic vertebral compression fracture that is new compared to November 24, 2017 approximately 40% loss of  height. IMPRESSION: 1. No active cardiopulmonary disease. 2. Midthoracic vertebral compression fracture that is new compared to November 24, 2017 approximately 40% loss of height. This is age indeterminate but new compared to this remote prior exam. Correlate with any point tenderness in this location to determine whether this could represent an acute compression fracture. Electronically Signed   By: Zetta Bills M.D.   On: 05/22/2020 18:52   CT HEAD WO CONTRAST  Result Date: 05/22/2020 CLINICAL DATA:  Headache weakness EXAM: CT HEAD WITHOUT CONTRAST TECHNIQUE: Contiguous axial images were obtained from the base of the skull through the vertex without intravenous contrast. COMPARISON:  CT 10/06/2019, MRI 05/12/2019 FINDINGS: Brain: No acute territorial infarction, hemorrhage or new intracranial mass. Bilateral right greater than left parietal convexity CSF densities corresponding to arachnoid cysts on previous MRI. Advanced atrophy. Mild chronic small vessel ischemic change of the white matter. Stable degree of lateral and third ventricular dilatation. Vascular: No hyperdense  vessels.  Carotid vascular calcification. Skull: Normal. Negative for fracture or focal lesion. Sinuses/Orbits: Opacified right maxillary sinus. Mucosal disease in the ethmoid sinuses Other: None IMPRESSION: 1. No CT evidence for acute intracranial abnormality. 2. Atrophy and mild chronic small vessel ischemic change of the white matter. 3. Stable degree of ventricular enlargement. Grossly stable bilateral parietal arachnoid cysts. Electronically Signed   By: Donavan Foil M.D.   On: 05/22/2020 18:48        Scheduled Meds: . vitamin C  500 mg Oral Daily  . aspirin EC  81 mg Oral Daily  . carbidopa-levodopa  0.5 tablet Oral TID WC  . cholecalciferol  5,000 Units Oral Daily  . donepezil  10 mg Oral QHS  . enoxaparin (LOVENOX) injection  40 mg Subcutaneous Q24H  . famotidine  20 mg Oral BID  . levothyroxine  50 mcg Oral Q0600  . melatonin  10 mg Oral QHS  . mirtazapine  45 mg Oral QHS  . vitamin B-12  5,000 mcg Oral Daily  . zinc sulfate  220 mg Oral Daily   Continuous Infusions: . remdesivir 100 mg in NS 100 mL       LOS: 1 day    Time spent: 25 minutes    Sidney Ace, MD [facility] Pager [phone]  If 7PM-7AM, please contact night-coverage 05/24/2020, 10:33 AM

## 2020-05-24 NOTE — Progress Notes (Signed)
*  PRELIMINARY RESULTS* Echocardiogram 2D Echocardiogram has been performed.  Blake Bates 05/24/2020, 11:10 AM

## 2020-05-24 NOTE — NC FL2 (Signed)
[place] LEVEL OF CARE SCREENING TOOL     IDENTIFICATION  Patient Name: Blake Bates Birthdate: [birth year] Sex: male Admission Date (Current Location): 05/22/2020  [place] and [place] Number:  Engineering geologist and Address:  [facility], [address] [place], [place] [address]      Provider Number: [id number]  Attending Physician Name and Address:  Sidney Ace, MD  Relative Name and Phone Number:  Beacher Every (wife) [phone]    Current Level of Care: Hospital Recommended Level of Care: [place] Prior Approval Number:    Date Approved/Denied:   PASRR Number: [id number]  Discharge Plan: SNF    Current Diagnoses: Patient Active Problem List   Diagnosis Date Noted  . COVID-19 05/23/2020  . Normocytic anemia 01/31/2020  . Recurrent falls 10/31/2019  . History of CVA (cerebrovascular accident) 10/31/2019  . Generalized muscle weakness 10/31/2019  . Oropharyngeal dysphagia 06/21/2019  . Moderate protein-calorie malnutrition ([place]) 06/21/2019  . Balance disorder 06/21/2019  . Left homonymous hemianopsia 06/21/2019  . Pain due to onychomycosis of toenails of both feet 01/03/2019  . Lewy body dementia without behavioral disturbance ([place]) 07/21/2017  . Elevated PSA 12/09/2016  . Vitamin D deficiency 12/09/2016  . Chronic pain of right knee 11/04/2016  . Hyperlipidemia 11/04/2016  . CKD (chronic kidney disease), stage III ([place]) 11/04/2016  . Osteoarthritis of multiple joints 11/03/2016  . Chronic right hip pain 11/03/2016  . 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 Hypothyroidism 11/03/2014  . Asthma 11/03/2014  . Osteoporosis 11/03/2014  . Skin lesion of chest wall 11/03/2014  . Trigger finger of both hands 11/03/2014    Orientation RESPIRATION BLADDER Height & Weight     Self  Normal Incontinent,External catheter Weight: 56.7 kg Height:  5\' 11"  (180.3 cm)   BEHAVIORAL SYMPTOMS/MOOD NEUROLOGICAL BOWEL NUTRITION STATUS      Incontinent Diet (heart healthy)  AMBULATORY STATUS COMMUNICATION OF NEEDS Skin   Extensive Assist Verbally Normal                       Personal Care Assistance Level of Assistance  Bathing,Feeding,Dressing Bathing Assistance: Maximum assistance Feeding assistance: Maximum assistance Dressing Assistance: Maximum assistance     Functional Limitations Info  Sight,Hearing Sight Info: Impaired Hearing Info: Impaired      SPECIAL CARE FACTORS FREQUENCY  PT (By licensed PT),OT (By licensed OT)     PT Frequency: 5 times per week OT Frequency: 5 times per week            Contractures Contractures Info: Not present    Additional Factors Info  Code Status,Allergies,Isolation Precautions Code Status Info: Full Allergies Info: Mold extract, morphine     Isolation Precautions Info: airborne     Current Medications (05/24/2020):  This is the current hospital active medication list Current Facility-Administered Medications  Medication Dose Route Frequency Provider Last Rate Last Admin  . acetaminophen (TYLENOL) tablet 650 mg  650 mg Oral Q6H PRN Mansy, Arvella Merles, MD   650 mg at 05/23/20 2032  . ascorbic acid (VITAMIN C) tablet 500 mg  500 mg Oral Daily Mansy, Jan A, MD   500 mg at 05/24/20 1000  . aspirin EC tablet 81 mg  81 mg Oral Daily Mansy, Jan A, MD   81 mg at 05/24/20 1005  . carbidopa-levodopa (SINEMET IR) 25-100 MG per tablet immediate release 0.5 tablet  0.5 tablet Oral TID WC Mansy, Arvella Merles, MD  0.5 tablet at 05/24/20 1527  . chlorpheniramine-HYDROcodone (TUSSIONEX) 10-8 MG/5ML suspension 5 mL  5 mL Oral Q12H PRN Mansy, Jan A, MD      . cholecalciferol (VITAMIN D) tablet 5,000 Units  5,000 Units Oral Daily Mansy, Jan A, MD   5,000 Units at 05/24/20 1001  . diltiazem (CARDIZEM) injection 10 mg  10 mg Intravenous Q2H PRN Priscella Mann, Sudheer B, MD      . donepezil (ARICEPT) tablet 10 mg  10 mg Oral QHS  Mansy, Jan A, MD   10 mg at 05/23/20 2032  . enoxaparin (LOVENOX) injection 40 mg  40 mg Subcutaneous Q24H Mansy, Jan A, MD   40 mg at 05/24/20 1006  . famotidine (PEPCID) tablet 20 mg  20 mg Oral BID Mansy, Jan A, MD   20 mg at 05/24/20 1002  . guaiFENesin-dextromethorphan (ROBITUSSIN DM) 100-10 MG/5ML syrup 10 mL  10 mL Oral Q4H PRN Mansy, Jan A, MD      . hydrALAZINE (APRESOLINE) injection 10 mg  10 mg Intravenous Q4H PRN Ralene Muskrat B, MD   10 mg at 05/23/20 1247  . levothyroxine (SYNTHROID) tablet 50 mcg  50 mcg Oral Q0600 Mansy, Jan A, MD   50 mcg at 05/23/20 0502  . loratadine (CLARITIN) tablet 10 mg  10 mg Oral Daily PRN Mansy, Jan A, MD      . magnesium hydroxide (MILK OF MAGNESIA) suspension 30 mL  30 mL Oral Daily PRN Mansy, Jan A, MD      . melatonin tablet 10 mg  10 mg Oral QHS Ralene Muskrat B, MD   10 mg at 05/23/20 2032  . mirtazapine (REMERON) tablet 45 mg  45 mg Oral QHS Mansy, Jan A, MD   45 mg at 05/23/20 2032  . ondansetron (ZOFRAN) tablet 4 mg  4 mg Oral Q6H PRN Mansy, Jan A, MD       Or  . ondansetron [facility]) injection 4 mg  4 mg Intravenous Q6H PRN Mansy, Jan A, MD   4 mg at 05/23/20 1247  . remdesivir 100 mg in sodium chloride 0.9 % 100 mL IVPB  100 mg Intravenous Daily Renda Rolls, RPH 200 mL/hr at 05/24/20 1126 100 mg at 05/24/20 1126  . traZODone (DESYREL) tablet 25 mg  25 mg Oral QHS PRN Mansy, Jan A, MD   25 mg at 05/24/20 0121  . vitamin B-12 (CYANOCOBALAMIN) tablet 5,000 mcg  5,000 mcg Oral Daily Renda Rolls, RPH   5,000 mcg at 05/24/20 4098  . zinc sulfate capsule 220 mg  220 mg Oral Daily Mansy, Jan A, MD   220 mg at 05/24/20 1005     Discharge Medications: Please see discharge summary for a list of discharge medications.  Relevant Imaging Results:  Relevant Lab Results:   Additional Information SS# [id number]  Shelbie Hutching, RN

## 2020-05-24 NOTE — Plan of Care (Signed)
  Problem: Education: Goal: Knowledge of General Education information will improve Description: Including pain rating scale, medication(s)/side effects and non-pharmacologic comfort measures 05/24/2020 1254 by Cristela Blue, RN Outcome: Progressing 05/24/2020 1254 by Cristela Blue, RN Outcome: Progressing   Problem: Health Behavior/Discharge Planning: Goal: Ability to manage health-related needs will improve 05/24/2020 1254 by Cristela Blue, RN Outcome: Progressing 05/24/2020 1254 by Cristela Blue, RN Outcome: Progressing   Problem: Clinical Measurements: Goal: Ability to maintain clinical measurements within normal limits will improve 05/24/2020 1254 by Cristela Blue, RN Outcome: Progressing 05/24/2020 1254 by Cristela Blue, RN Outcome: Progressing Goal: Will remain free from infection 05/24/2020 1254 by Cristela Blue, RN Outcome: Progressing 05/24/2020 1254 by Cristela Blue, RN Outcome: Progressing Goal: Diagnostic test results will improve 05/24/2020 1254 by Cristela Blue, RN Outcome: Progressing 05/24/2020 1254 by Cristela Blue, RN Outcome: Progressing Goal: Respiratory complications will improve 05/24/2020 1254 by Cristela Blue, RN Outcome: Progressing 05/24/2020 1254 by Cristela Blue, RN Outcome: Progressing Goal: Cardiovascular complication will be avoided 05/24/2020 1254 by Cristela Blue, RN Outcome: Progressing 05/24/2020 1254 by Cristela Blue, RN Outcome: Progressing   Problem: Activity: Goal: Risk for activity intolerance will decrease 05/24/2020 1254 by Cristela Blue, RN Outcome: Progressing 05/24/2020 1254 by Cristela Blue, RN Outcome: Progressing   Problem: Nutrition: Goal: Adequate nutrition will be maintained 05/24/2020 1254 by Cristela Blue, RN Outcome: Progressing 05/24/2020 1254 by Cristela Blue, RN Outcome: Progressing   Problem: Coping: Goal: Level of anxiety will decrease 05/24/2020 1254 by Cristela Blue, RN Outcome:  Progressing 05/24/2020 1254 by Cristela Blue, RN Outcome: Progressing   Problem: Elimination: Goal: Will not experience complications related to bowel motility 05/24/2020 1254 by Cristela Blue, RN Outcome: Progressing 05/24/2020 1254 by Cristela Blue, RN Outcome: Progressing Goal: Will not experience complications related to urinary retention 05/24/2020 1254 by Cristela Blue, RN Outcome: Progressing 05/24/2020 1254 by Cristela Blue, RN Outcome: Progressing   Problem: Pain Managment: Goal: General experience of comfort will improve 05/24/2020 1254 by Cristela Blue, RN Outcome: Progressing 05/24/2020 1254 by Cristela Blue, RN Outcome: Progressing   Problem: Safety: Goal: Ability to remain free from injury will improve 05/24/2020 1254 by Cristela Blue, RN Outcome: Progressing 05/24/2020 1254 by Cristela Blue, RN Outcome: Progressing   Problem: Skin Integrity: Goal: Risk for impaired skin integrity will decrease 05/24/2020 1254 by Cristela Blue, RN Outcome: Progressing 05/24/2020 1254 by Cristela Blue, RN Outcome: Progressing   Problem: Education: Goal: Knowledge of risk factors and measures for prevention of condition will improve 05/24/2020 1254 by Cristela Blue, RN Outcome: Progressing 05/24/2020 1254 by Cristela Blue, RN Outcome: Progressing   Problem: Coping: Goal: Psychosocial and spiritual needs will be supported 05/24/2020 1254 by Cristela Blue, RN Outcome: Progressing 05/24/2020 1254 by Cristela Blue, RN Outcome: Progressing   Problem: Respiratory: Goal: Will maintain a patent airway 05/24/2020 1254 by Cristela Blue, RN Outcome: Progressing 05/24/2020 1254 by Cristela Blue, RN Outcome: Progressing Goal: Complications related to the disease process, condition or treatment will be avoided or minimized 05/24/2020 1254 by Cristela Blue, RN Outcome: Progressing 05/24/2020 1254 by Cristela Blue, RN Outcome: Progressing

## 2020-05-24 NOTE — Evaluation (Signed)
Physical Therapy Evaluation Patient Details Name: Blake Bates MRN: [id number] DOB: 03-May-1941 Today's Date: 05/24/2020   History of Present Illness  "Blake Clos" Bates is a 89yoM who comes to [facility] on 05/22/20 Bates inability to walk, acute on chronic AMS. Patient tested (+) COVID19. PMH: Lewy Bodies, PD, baseline AMB difficulty with RW, HLD, hypoTSH, osteoporosis. Pt febrile in ED. CXR revealing of new mid thoracic compression fracture 40% height loss, chronicity unknown.  Clinical Impression  Pt admitted with above diagnosis. Pt currently with functional limitations due to the deficits listed below (see "PT Problem List"). Pt fmailiar to author from [facility] treatment in 2021. Upon entry, pt in bed, asleep, wife in room. Pt difficult to make alert, but becomes more alert once assisted to EOB. The pt is pleasant, interactive, wife provides info regarding prior level of function, both in tolerance and independence. Pt panicky at EOB, feels like he is falling. 3 attempted dependent transfers with totalA, pt becomes anxious with writhing each time, never able to fully rise, pt not able to provide much effort. Pt positioned back in bed at EOS, sitting up tall. Patient's performance this date reveals decreased ability, independence, and tolerance in performing all basic mobility required for performance of activities of daily living. Pt requires additional DME, close physical assistance, and cues for safe participate in mobility. Pt will benefit from skilled PT intervention to increase independence and safety with basic mobility in preparation for discharge to the venue listed below.       Follow Up Recommendations SNF;Supervision for mobility/OOB    Equipment Recommendations       Recommendations for Other Services       Precautions / Restrictions Precautions Precautions: Fall Precaution Comments: New incidental finding of mid thoracic compression fracture, age indeterminate Restrictions Weight  Bearing Restrictions: No      Mobility  Bed Mobility Overal bed mobility: Needs Assistance Bed Mobility: Supine to Sit;Sit to Supine     Supine to sit: Total assist Sit to supine: Total assist   General bed mobility comments: appears to be trying to follow commands for participation, but motorically struggles with heavy tremor.    Transfers Overall transfer level: Needs assistance Equipment used: 1 person hand held assist Transfers: Sit to/from Stand Sit to Stand: Total assist;+2 physical assistance         General transfer comment: Pt very fearful of falling, anxious, has panic attack with each of three attemps grasping authors neck firmly. Unable to fully rise, not safe to attempt pivot to chair in current state. Wife in attendance thoughout, helps when able.  Ambulation/Gait                Stairs            Wheelchair Mobility    Modified Rankin (Stroke Patients Only)       Balance Overall balance assessment: Needs assistance Sitting-balance support: Feet supported Sitting balance-Leahy Scale: Fair Sitting balance - Comments: only brief moments without support, return of posterior lean                                     Pertinent Vitals/Pain Pain Assessment: No/denies pain (denies pain, mostly anxious with positional changes)    Home Living Family/patient expects to be discharged to:: Private residence Living Arrangements: Spouse/significant other;Non-relatives/Friends Available Help at Discharge: Family;Personal care attendant;Available 24 hours/day Type of Home: House Home Access: Stairs to enter Entrance  Stairs-Rails: Right;Left Entrance Stairs-Number of Steps: 5 Home Layout: One level Home Equipment: Walker - 2 wheels;Wheelchair - manual Additional Comments: bed rail on their standard bed    Prior Function Level of Independence: Needs assistance   Gait / Transfers Assistance Needed: Pt ambulates with RW at baseline with  supervision per wife's report. She reports level of assistance can vary and sometimes he ambulates without use of RW  ADL's / Homemaking Assistance Needed: Pt has caregiver that also lives in the home to assist pt's wife. Pt sits on commode while they assist with bathing and dressing mod A.  Comments: Wife reports pt has maintained function well since DC from [place]: Right    Extremity/Trunk Assessment                Communication   Communication: Other (comment);Expressive difficulties (Parkinsonian hypophonia, delayed response time)  Cognition Arousal/Alertness: Lethargic Behavior During Therapy: Flat affect;Anxious Overall Cognitive Status: History of cognitive impairments - at baseline                                 General Comments: sleepy, but more alert once at [facility]      General Comments      Exercises     Assessment/Plan    PT Assessment Patient needs continued PT services  PT Problem List Decreased strength;Decreased range of motion;Decreased activity tolerance;Decreased balance;Decreased mobility;Decreased cognition;Decreased safety awareness;Decreased knowledge of precautions       PT Treatment Interventions DME instruction;Therapeutic exercise;Gait training;Stair training;Balance training;Neuromuscular re-education;Functional mobility training;Therapeutic activities;Patient/family education    PT Goals (Current goals can be found in the Care Plan section)  Acute Rehab PT Goals Patient Stated Goal: to return home if able PT Goal Formulation: With patient Time For Goal Achievement: 06/07/20 Potential to Achieve Goals: Fair    Frequency Min 2X/week   Barriers to discharge Decreased caregiver support wife unable to provide heavy physical assist    Co-evaluation               AM-PAC PT "6 Clicks" Mobility  Outcome Measure Help needed turning from your back to your side while in a flat bed  without using bedrails?: Total Help needed moving from lying on your back to sitting on the side of a flat bed without using bedrails?: Total Help needed moving to and from a bed to a chair (including a wheelchair)?: Total Help needed standing up from a chair using your arms (e.g., wheelchair or bedside chair)?: Total Help needed to walk in hospital room?: Total Help needed climbing 3-5 steps with a railing? : Total 6 Click Score: 6    End of Session   Activity Tolerance: Patient limited by fatigue;Treatment limited secondary to agitation Patient left: in bed;with call bell/phone within reach;with nursing/sitter in room;with family/visitor present (sitting tall ~40 degrees of trunk) Nurse Communication: Mobility status PT Visit Diagnosis: Unsteadiness on feet (R26.81);Other abnormalities of gait and mobility (R26.89);Repeated falls (R29.6);Muscle weakness (generalized) (M62.81);Other symptoms and signs involving the nervous system (R29.898)    Time: 6378-5885 PT Time Calculation (min) (ACUTE ONLY): 24 min   Charges:   PT Evaluation $PT Eval Moderate Complexity: 1 Mod PT Treatments $Therapeutic Exercise: 8-22 mins        1:02 PM, 05/24/20 Etta Grandchild, PT, DPT Physical Therapist - [facility]  [phone] [facility])    Blake Paul  Bates 05/24/2020, 12:59 PM

## 2020-05-25 DIAGNOSIS — U071 COVID-19: Secondary | ICD-10-CM | POA: Diagnosis not present

## 2020-05-25 LAB — CBC WITH DIFFERENTIAL/PLATELET
Abs Immature Granulocytes: 0.02 10*3/uL (ref 0.00–0.07)
Basophils Absolute: 0 10*3/uL (ref 0.0–0.1)
Basophils Relative: 0 %
Eosinophils Absolute: 0 10*3/uL (ref 0.0–0.5)
Eosinophils Relative: 0 %
HCT: 38.1 % — ABNORMAL LOW (ref 39.0–52.0)
Hemoglobin: 12.7 g/dL — ABNORMAL LOW (ref 13.0–17.0)
Immature Granulocytes: 0 %
Lymphocytes Relative: 10 %
Lymphs Abs: 0.7 10*3/uL (ref 0.7–4.0)
MCH: 30 pg (ref 26.0–34.0)
MCHC: 33.3 g/dL (ref 30.0–36.0)
MCV: 90.1 fL (ref 80.0–100.0)
Monocytes Absolute: 0.7 10*3/uL (ref 0.1–1.0)
Monocytes Relative: 10 %
Neutro Abs: 5 10*3/uL (ref 1.7–7.7)
Neutrophils Relative %: 80 %
Platelets: 214 10*3/uL (ref 150–400)
RBC: 4.23 MIL/uL (ref 4.22–5.81)
RDW: 14.2 % (ref 11.5–15.5)
Smear Review: NORMAL
WBC: 6.4 10*3/uL (ref 4.0–10.5)
nRBC: 0 % (ref 0.0–0.2)

## 2020-05-25 LAB — C-REACTIVE PROTEIN: CRP: 14.6 mg/dL — ABNORMAL HIGH (ref <1.0)

## 2020-05-25 LAB — BASIC METABOLIC PANEL
Anion gap: 12 (ref 5–15)
BUN: 35 mg/dL — ABNORMAL HIGH (ref 8–23)
CO2: 22 mmol/L (ref 22–32)
Calcium: 8.9 mg/dL (ref 8.9–10.3)
Chloride: 107 mmol/L (ref 98–111)
Creatinine, Ser: 1.63 mg/dL — ABNORMAL HIGH (ref 0.61–1.24)
GFR, Estimated: 43 mL/min — ABNORMAL LOW (ref >60)
Glucose, Bld: 138 mg/dL — ABNORMAL HIGH (ref 70–99)
Potassium: 3.8 mmol/L (ref 3.5–5.1)
Sodium: 141 mmol/L (ref 135–145)

## 2020-05-25 MED ORDER — FAMOTIDINE 20 MG PO TABS
20.0000 mg | ORAL_TABLET | Freq: Every day | ORAL | Status: DC
  Administered 2020-05-26 – 2020-06-02 (×8): 20 mg via ORAL
  Filled 2020-05-25 (×9): qty 1

## 2020-05-25 MED ORDER — PANTOPRAZOLE SODIUM 40 MG IV SOLR
40.0000 mg | INTRAVENOUS | Status: DC
  Administered 2020-05-25 – 2020-05-28 (×4): 40 mg via INTRAVENOUS
  Filled 2020-05-25 (×4): qty 40

## 2020-05-25 NOTE — Evaluation (Signed)
Clinical/Bedside Swallow Evaluation Patient Details  Name: Blake Bates MRN: [id number] Date of Birth: March 13, 1941  Today's Date: 05/25/2020 Time: SLP Start Time (ACUTE ONLY): 74 SLP Stop Time (ACUTE ONLY): 1510 SLP Time Calculation (min) (ACUTE ONLY): 60 min  Past Medical History:  Past Medical History:  Diagnosis Date  . Asthma   . Diverticulosis   . Full dentures   . Hyperlipidemia   . Hypothyroidism    s/p subtotal thyroidectomy  . Muscle spasms of lower extremity    Right upper leg  . Osteoporosis   . Rhinitis, allergic    pollens, mold, animal dander   Past Surgical History:  Past Surgical History:  Procedure Laterality Date  . CATARACT EXTRACTION W/PHACO Left 09/20/2014   Procedure: CATARACT EXTRACTION PHACO AND INTRAOCULAR LENS PLACEMENT (IOC);  Surgeon: Leandrew Koyanagi, MD;  Location: [facility];  Service: Ophthalmology;  Laterality: Left;  . COLONOSCOPY    . PARATHYROIDECTOMY    . THYROID SURGERY     subtotal   HPI:  Pt is a 80 y.o. Caucasian male with a known history of asthma, dyslipidemia, hypothyroidism, Lewy body Dementia and Parkinson's disease, who presented to the emergency room with acute onset of altered mental status with delirium and confusion that started today.  He is usually able to ambulate with a walker at his baseline and today he was feeling so weak he could not walk.  The patient has been vaccinated against [id number] and had his booster shot on 02/07/2020.  He has been having cough productive of clear sputum and rhinorrhea without wheezing or dyspnea.  No nausea or vomiting or diarrhea.  Pt is followed by Chronic Care Management; has had OT/PT and aide in the home for support.  He has a much reduced oral diet of honey nut cheerios w/ attempts at Ensure per report.  Weight loss.  Wife described Significant episodes of Phlegm pt expectorates in the night and morning/day.  No dx'd Reflux/GERD; not on PPI at home.   Covid+.   Assessment /  Plan / Recommendation Clinical Impression  Pt appears to present w/ oropharyngeal phase dysphagia in light of Significantly declined Cognitive status; Lewy-body Dementia w/ Parkinson's Dis.(has Not been taking Sinemet at home) per Wife's report. Pt is less engaged and sleepy/shut down requiring verbal/tactile cues to awaken and attend to po tasks this session. These factors impact his overall awareness/engagement and safety during po tasks which increases risk for aspiration, choking. Pt has risk for aspiration as well as concern for meeting nutritional needs fully, safely. When following aspiration precautions and given feeding assistance of modified consistencies, he appears to tolerate bites/sips given w/out overt s/s of aspiration noted. He requires Mod+ tactile/verbal/ visual cues for orientation to bolus presentation, and during feeding support(he often maintained closed eyes and mumbled speech during the eval). Pt consumed only few trials of purees, and TSP trials of Nectar liquids w/ No immediate, overt clinical s/s of aspiration noted; no decline in vocal quality during few verbalizations, no cough, and no decline in respiratory status during/post trials. Laryngeal excursion appeared adequate. Oral phase was c/b slow attention to bolus trials w/ min increased Time needed for oral clearing of the boluses given. He often presented w/ a closed mouth and was not agreeable to taking any po's - oral prep deficits. OM Exam was cursory/observation, but No unilateral lingual weakness noted. Pt appeared confused w/ attempted support w/ feeding of po trials. D/t pt's declined Cognitive status, his risk for aspiration, recommend initiation of  a trial of dysphagia level 1(puree) diet w/ Nectar liquids; aspiration precautions; reduce Distractions during meals and only give po's when pt is calm and awake to participate. Pills Crushed in Puree as able for safer swallowing. Support w/ feeding at meals -- check for oral  clearing during/post intake. NSG updated. ST services recommends follow w/ Palliative Care for [place] as he has had weight loss and a decline in general status overall per chart notes. ST services will follow pt's status while admited giving education and trials to upgrade if appropriate, safe. Largely suspect that pt's Dementia and impact of illness could continue to hamper oral intake and upgrade of diet. Precautions posted at room. NSG and Wife updated, agreed.  OF Note, Wife endorses pt has Mod+ phlegm he expectorates - MD has ordered a PPI IV this admit. This could impact his overall appetite and increase risk for aspiration of the Reflux/phlegm material when Regurgitating. NSG aware. SLP Visit Diagnosis: Dysphagia, oropharyngeal phase (R13.12) (Dementia baseline)    Aspiration Risk  Moderate aspiration risk;Risk for inadequate nutrition/hydration    Diet Recommendation  Dysphagia level 1 (puree) w/ gravies; Nectar liquids via TSP preferred. Aspiration precautions. Reflux precautions. Feeding and Supervision at all meals. Only give po's when fully awake/alert to engage safely.  Medication Administration: Crushed with puree (as able to)    Other  Recommendations Recommended Consults: Consider GI evaluation (Dietician; Palliative Care consult for [place]) Oral Care Recommendations: Oral care BID;Oral care before and after PO;Staff/trained caregiver to provide oral care Other Recommendations: Order thickener from pharmacy;Prohibited food (jello, ice cream, thin soups);Remove water pitcher;Have oral suction available   Follow up Recommendations  (TBD)      Frequency and Duration min 3x week  2 weeks       Prognosis Prognosis for Safe Diet Advancement: Guarded Barriers to Reach Goals: Cognitive deficits;Language deficits;Time post onset;Severity of deficits;Behavior      Swallow Study   General Date of Onset: 05/22/20 HPI: Pt is a 80 y.o. Caucasian male with a known history of asthma,  dyslipidemia, hypothyroidism, Lewy body Dementia and Parkinson's disease, who presented to the emergency room with acute onset of altered mental status with delirium and confusion that started today.  He is usually able to ambulate with a walker at his baseline and today he was feeling so weak he could not walk.  The patient has been vaccinated against [id number] and had his booster shot on 02/07/2020.  He has been having cough productive of clear sputum and rhinorrhea without wheezing or dyspnea.  No nausea or vomiting or diarrhea.  Pt is followed by Chronic Care Management; has had OT/PT and aide in the home for support.  He has a much reduced oral diet of honey nut cheerios w/ attempts at Ensure per report.  Weight loss.  Wife described Significant episodes of Phlegm pt expectorates in the night and morning/day.  No dx'd Reflux/GERD; not on PPI at home.   Covid+. Type of Study: Bedside Swallow Evaluation Previous Swallow Assessment: mbss 06/2019 - oropharyngeal dysphagia characterized by slow/prolonged oral management, delayed pharyngeal swallow initiation, reduced tongue base retraction, hyolaryngeal excursion, epiglottic inversion, mild pharyngeal residue, and one episode of flash laryngeal penetration.  There is no observed tracheal aspiration. Diet Prior to this Study: Dysphagia 3 (soft);Thin liquids (per wife's report of foods) Temperature Spikes Noted:  (wbc 6.4) Respiratory Status: Room air History of Recent Intubation: No Behavior/Cognition: Confused;Lethargic/Drowsy;Distractible;Requires cueing;Doesn't follow directions;Pleasant mood Oral Cavity Assessment:  (limited d/t pt's cooperation; tongue adequate) Oral  Care Completed by SLP:  (attempted) Oral Cavity - Dentition: Dentures, top;Dentures, bottom Vision:  (n/a) Self-Feeding Abilities: Total assist Patient Positioning: Upright in bed (needed full positioning support) Baseline Vocal Quality: Low vocal intensity (mumbled speech) Volitional  Cough: Cognitively unable to elicit Volitional Swallow: Unable to elicit    Oral/Motor/Sensory Function Overall Oral Motor/Sensory Function: Generalized oral weakness   Ice Chips Ice chips: Impaired Presentation: Spoon (fed; 3 trials) Oral Phase Impairments: Poor awareness of bolus;Reduced lingual movement/coordination Oral Phase Functional Implications:  (bolus loss) Pharyngeal Phase Impairments:  (adequate) Other Comments: loss of control   Thin Liquid Thin Liquid: Not tested    Nectar Thick Nectar Thick Liquid: Impaired Presentation: Spoon (fed; 5 trials) Oral Phase Impairments: Poor awareness of bolus;Reduced lingual movement/coordination Oral phase functional implications: Prolonged oral transit (confusion) Pharyngeal Phase Impairments:  (none noted)   Honey Thick Honey Thick Liquid: Not tested   Puree Puree: Impaired Presentation: Spoon (fed; 4 trials) Oral Phase Impairments: Reduced lingual movement/coordination;Poor awareness of bolus Oral Phase Functional Implications: Prolonged oral transit (confusion) Pharyngeal Phase Impairments:  (none noted)   Solid     Solid: Not tested       Orinda Kenner, MS, CCC-SLP Speech Language Pathologist Rehab Services [phone] [facility] 05/25/2020,4:09 PM

## 2020-05-25 NOTE — TOC Progression Note (Signed)
Transition of Care [facility]) - Progression Note    Patient Details  Name: Blake Bates MRN: [id number] Date of Birth: Sep 03, 1940  Transition of [facility]) CM/SW Contact  Shelbie Hutching, RN Phone Number: 05/25/2020, 4:19 PM  Clinical Narrative:    No bed offers received at this time.  Referral sent out to [facility]     Expected Discharge Plan: [place] Barriers to Discharge: Continued Medical Work up  Expected Discharge Plan and Services Expected Discharge Plan: [place]   Discharge Planning Services: CM Consult Post Acute Care Choice: [place] Living arrangements for the past 2 months: Single Family Home                                       Social Determinants of Health (SDOH) Interventions    Readmission Risk Interventions No flowsheet data found.

## 2020-05-25 NOTE — Care Management Important Message (Signed)
Important Message  Patient Details  Name: Blake Bates MRN: [id number] Date of Birth: [birth year]   Medicare Important Message Given:  Yes     Shelbie Hutching, RN 05/25/2020, 11:12 AM

## 2020-05-25 NOTE — Progress Notes (Signed)
Physical Therapy Treatment Patient Details Name: Blake Bates MRN: [id number] DOB: 01-17-1941 Today's Date: 05/25/2020    History of Present Illness "Blake Bates" Bates is a 23yoM who comes to [facility] on 05/22/20 c inability to walk, acute on chronic AMS. Patient tested (+) COVID19. PMH: Lewy Bodies, PD, baseline AMB difficulty with RW, HLD, hypoTSH, osteoporosis. Pt febrile in ED. CXR revealing of new mid thoracic compression fracture 40% height loss, chronicity unknown.    PT Comments    Pt in bed, wife at bedside. Pt awake and alert. Pt still disoriented to situation, place, attempted several times to orient pt but he remains confused, believes to be at home right now. Pt with severe left hemineglect in session this date, unclear if present durign evaluation yesterday, but much more apparent today, pt unwilling to attend to any stimulus to the Left, with heavy cuing will turn head left to the 11:00 position, but not visualize target, not even his wife on the left side. He does have intermittent interaction with auditory stimulus on the left, impaired, but without obvious laterality. At EOB, pt again has self selected slight Rt rotation, heavy Rt lateral sidebend, leaning on Rt elbow. He follows heavy multimodal cues for bringing trunk forward a few times, but never able to understand correction of Right lean. Pt has intermittent feeling of falls when sitting up, as well as when author provides maxA correction of lean, pt becoming anxious and pushing hard/max effort to right. Once trunk fatigue was evident, pt assisted back to supine in bed, then semirecumbent to maximize sitting up tall in bed. Educated wife on engaging with patient from the Left side of bed for now. While sitting, HR did elevate briefly into 120s.    Follow Up Recommendations  SNF;Supervision for mobility/OOB     Equipment Recommendations  Rolling walker with 5" wheels    Recommendations for Other Services        Precautions / Restrictions Precautions Precautions: Fall Precaution Comments: New incidental finding of mid thoracic compression fracture, age indeterminate Restrictions Weight Bearing Restrictions: No    Mobility  Bed Mobility Overal bed mobility: Needs Assistance Bed Mobility: Supine to Sit;Sit to Supine     Supine to sit: Total assist;+2 for physical assistance Sit to supine: Total assist;+2 for physical assistance   General bed mobility comments: still very confused, no clear activation, but pt also with Left hemineglect now, so commands for coming to Left EOB may have been an impossibility.  Transfers Overall transfer level:  (unable to safely attempt, pt not sitting up.)                  Ambulation/Gait                 Stairs             Wheelchair Mobility    Modified Rankin (Stroke Patients Only)       Balance Overall balance assessment: Needs assistance Sitting-balance support: Single extremity supported;Feet supported Sitting balance-Leahy Scale: Zero Sitting balance - Comments: only brief moments without support, return of posterior lean                                    Cognition Arousal/Alertness: Awake/alert Behavior During Therapy: Anxious Overall Cognitive Status: History of cognitive impairments - at baseline  Exercises      General Comments        Pertinent Vitals/Pain Pain Assessment: No/denies pain    Home Living                      Prior Function            PT Goals (current goals can now be found in the care plan section) Acute Rehab PT Goals Patient Stated Goal: to return home if able PT Goal Formulation: With patient Time For Goal Achievement: 06/07/20 Potential to Achieve Goals: Fair Progress towards PT goals: Progressing toward goals    Frequency    Min 2X/week      PT Plan Current plan remains appropriate     Co-evaluation              AM-PAC PT "6 Clicks" Mobility   Outcome Measure  Help needed turning from your back to your side while in a flat bed without using bedrails?: Total Help needed moving from lying on your back to sitting on the side of a flat bed without using bedrails?: Total Help needed moving to and from a bed to a chair (including a wheelchair)?: Total Help needed standing up from a chair using your arms (e.g., wheelchair or bedside chair)?: Total Help needed to walk in hospital room?: Total Help needed climbing 3-5 steps with a railing? : Total 6 Click Score: 6    End of Session Equipment Utilized During Treatment: Gait belt Activity Tolerance: Patient limited by fatigue;Treatment limited secondary to agitation Patient left: in bed;with call bell/phone within reach;with nursing/sitter in room;with family/visitor present Nurse Communication: Mobility status PT Visit Diagnosis: Unsteadiness on feet (R26.81);Other abnormalities of gait and mobility (R26.89);Repeated falls (R29.6);Muscle weakness (generalized) (M62.81);Other symptoms and signs involving the nervous system (R29.898)     Time: 5916-3846 PT Time Calculation (min) (ACUTE ONLY): 23 min  Charges:  $Neuromuscular Re-education: 23-37 mins                     4:10 PM, 05/25/20 Etta Grandchild, PT, DPT Physical Therapist - [facility]  [phone] ([place])    [place] C 05/25/2020, 4:03 PM

## 2020-05-25 NOTE — Progress Notes (Signed)
PROGRESS NOTE    Blake Bates  [id number] DOB: February 27, 1941 DOA: 05/22/2020 PCP: [name], DO   Brief Narrative:   80 year old Caucasian male history of Parkinson's and Lewy body dementia who presented to the emergency room with acute onset of altered mentation and weakness.  Per the patient's wife his change occurred very acutely.  At baseline he is able to ambulate with a walker.  Today he was so weak he could barely get up out of bed.  The patient has been vaccinated against [id number] and had a booster shot on 02/07/2020.  On admission he was found to be COVID-positive.  No pneumonia noted on chest x-ray.  No oxygen requirement.  Patient did develop nonsustained narrow complex tachycardia, suspect SVT versus atrial fibrillation.  Unfortunately we could not capture this on twelve-lead EKG.  Since this one episode, he has not had recurrence of his neurologic symptoms.   Assessment & Plan:   Active Problems:   COVID-19  COVID-19 infection Acute metabolic encephalopathy secondary to above Sepsis ruled out Patient is vaccinated and booster.  He has no pneumonia on chest x-ray however has had intermittent fevers.  Last fever noted on 1222 at 2019.  No fever since. Plan: Continue remdesivir, dose 3/5 Hold steroids at this time given lack of hypoxia and pneumonia Monitor inflammatory markers Oxygen if needed Every 4 hours neurochecks Frequent reorienting measures  Parkinson's Lewy body dementia Patient has had acute deterioration of his neurologic status, decreased level of mobility, increased tremors since the day of admission.  This is likely an exacerbation of his underlying Parkinson's/Lewy body dementia in the setting of acute COVID infection.  His neurologic status seems to be poor but slowly improving.    Plan: Continue Aricept at home dose Continue Sinemet at home dose Up as tolerated with nursing and therapy Will formally consult neurology if  neurologic symptoms do not improve as his infection resolves  Hypothyroidism Continue Synthroid  Stage IIIb chronic kidney disease No IV fluids at this time Creatinine at or near baseline Will follow Chem-7  DVT prophylaxis: Lovenox Code Status: Full Family Communication: Wife [name] [phone] on 1/14 Disposition Plan: Status is: Inpatient  Remains inpatient appropriate because:Altered mental status and Inpatient level of care appropriate due to severity of illness   Dispo: The patient is from: Home              Anticipated d/c is to: SNF              Anticipated d/c date is: 2 days              Patient currently is not medically stable to d/c.  Patient remains symptomatic in setting of COVID infection and decompensated movement disorder.  Neurologic status improved but not at baseline.  Anticipate 48 hours prior to disposition planning.  Anticipate skilled nursing facility at time of discharge.      Consultants:   None  Procedures:   None  Antimicrobials:   Remdesivir   Subjective: Patient seen and examined.  Sleepy but easily roused.  Seems less tremulous.  Objective: Vitals:   05/24/20 2128 05/25/20 0216 05/25/20 0549 05/25/20 0745  BP: 140/87 (!) 146/93 (!) 156/78 95/66  Pulse: 79 72 71 88  Resp: 20 (!) 23 20 16   Temp: 98.3 F (36.8 C) 97.9 F (36.6 C) 97.7 F (36.5 C) 98.5 F (36.9 C)  TempSrc:      SpO2: 93% 93% 96% 90%  Weight:  Height:        Intake/Output Summary (Last 24 hours) at 05/25/2020 1057 Last data filed at 05/25/2020 0605 Gross per 24 hour  Intake 60 ml  Output 700 ml  Net -640 ml   Filed Weights   05/22/20 1802  Weight: 56.7 kg    Examination:  General exam: Tremulous Respiratory system: Clear to auscultation.  Normal work of breathing.  Room air Cardiovascular system: Tachycardic, regular rhythm, no murmurs Gastrointestinal system: Abdomen is nondistended, soft and nontender. No organomegaly or masses felt. Normal  bowel sounds heard. Central nervous system: Alert, oriented x2, tremulous but no focal deficits. Extremities: Symmetrically decreased power upper and lower extremities Skin: No rashes, lesions or ulcers Psychiatry: Judgement and insight appear impaired. Mood & affect flattened.     Data Reviewed: I have personally reviewed following labs and imaging studies  CBC: Recent Labs  Lab 05/22/20 1805 05/24/20 1026 05/25/20 0459  WBC 7.3 6.9 6.4  NEUTROABS 6.0 5.5 5.0  HGB 11.9* 13.0 12.7*  HCT 36.9* 40.2 38.1*  MCV 92.3 91.6 90.1  PLT 194 197 [id number]   Basic Metabolic Panel: Recent Labs  Lab 05/22/20 1805 05/23/20 0423 05/24/20 1026 05/25/20 0459  NA 134* 139 141 141  K 4.1 3.9 4.6 3.8  CL 100 105 105 107  CO2 27 24 22 22   GLUCOSE 105* 88 110* 138*  BUN 38* 34* 29* 35*  CREATININE 1.61* 1.58* 1.59* 1.63*  CALCIUM 9.0 8.7* 9.1 8.9   GFR: Estimated Creatinine Clearance: 29.5 mL/min (A) (by C-G formula based on SCr of 1.63 mg/dL (H)). Liver Function Tests: Recent Labs  Lab 05/22/20 1805  AST 27  ALT 21  ALKPHOS 82  BILITOT 0.8  PROT 7.3  ALBUMIN 4.0   No results for input(s): LIPASE, AMYLASE in the last 168 hours. No results for input(s): AMMONIA in the last 168 hours. Coagulation Profile: Recent Labs  Lab 05/22/20 1805  INR 0.9   Cardiac Enzymes: No results for input(s): CKTOTAL, CKMB, CKMBINDEX, TROPONINI in the last 168 hours. BNP (last 3 results) No results for input(s): PROBNP in the last 8760 hours. HbA1C: No results for input(s): HGBA1C in the last 72 hours. CBG: No results for input(s): GLUCAP in the last 168 hours. Lipid Profile: No results for input(s): CHOL, HDL, LDLCALC, TRIG, CHOLHDL, LDLDIRECT in the last 72 hours. Thyroid Function Tests: No results for input(s): TSH, T4TOTAL, FREET4, T3FREE, THYROIDAB in the last 72 hours. Anemia Panel: No results for input(s): VITAMINB12, FOLATE, FERRITIN, TIBC, IRON, RETICCTPCT in the last 72 hours. Sepsis  Labs: Recent Labs  Lab 05/22/20 1805 05/23/20 0423 05/24/20 1026  PROCALCITON  --  0.20 0.16  LATICACIDVEN 1.1  --   --     Recent Results (from the past 240 hour(s))  Urine Culture     Status: None   Collection Time: 05/22/20 11:59 PM   Specimen: Urine, Random  Result Value Ref Range Status   Specimen Description   Final    URINE, RANDOM Performed at [facility], [address] [place], [place] [address]    Special Requests   Final    Normal Performed at [facility], [address] [place], [place] [address]    Culture   Final    NO GROWTH Performed at [facility], [place] [address], [place], [place] [address]    Report Status 05/24/2020 FINAL  Final         Radiology Studies: ECHOCARDIOGRAM COMPLETE  Result Date:  05/24/2020    ECHOCARDIOGRAM REPORT   Patient Name:   TORRIS HOUSE Date of Exam: 05/24/2020 Medical Rec #:  [id number]          Height:       71.0 in Accession #:    [id number]         Weight:       125.0 lb Date of Birth:  [birth year]          BSA:          1.727 m Patient Age:    23 years           BP:           151/71 mmHg Patient Gender: M                  HR:           80 bpm. Exam Location:  [facility] Procedure: 2D Echo, Color Doppler and Cardiac Doppler Indications:     I48.91 Atrial Fibrillation  History:         Patient has no prior history of Echocardiogram examinations.                  CKD; Risk Factors:Dyslipidemia. Pt tested positive for COVID-19                  on 05/22/20.  Sonographer:     Charmayne Sheer RDCS (AE) Referring Phys:  [id number] Sidney Ace Diagnosing Phys: Bartholome Bill MD  Sonographer Comments: Suboptimal parasternal window. IMPRESSIONS  1. Left ventricular ejection fraction, by estimation, is 60 to 65%. The left ventricle has normal function. The left ventricle has no regional wall motion abnormalities. Left ventricular diastolic parameters were normal.  2. Right ventricular systolic function is normal. The  right ventricular size is mildly enlarged.  3. The mitral valve was not well visualized. Trivial mitral valve regurgitation.  4. The aortic valve was not well visualized. Aortic valve regurgitation is mild. FINDINGS  Left Ventricle: Left ventricular ejection fraction, by estimation, is 60 to 65%. The left ventricle has normal function. The left ventricle has no regional wall motion abnormalities. The left ventricular internal cavity size was normal in size. There is  no left ventricular hypertrophy. Left ventricular diastolic parameters were normal. Right Ventricle: The right ventricular size is mildly enlarged. No increase in right ventricular wall thickness. Right ventricular systolic function is normal. Left Atrium: Left atrial size was normal in size. Right Atrium: Right atrial size was normal in size. Pericardium: There is no evidence of pericardial effusion. Mitral Valve: The mitral valve was not well visualized. Trivial mitral valve regurgitation. MV peak gradient, 4.4 mmHg. The mean mitral valve gradient is 2.0 mmHg. Tricuspid Valve: The tricuspid valve is not well visualized. Tricuspid valve regurgitation is mild. Aortic Valve: The aortic valve was not well visualized. Aortic valve regurgitation is mild. Aortic regurgitation PHT measures 545 msec. Aortic valve mean gradient measures 8.0 mmHg. Aortic valve peak gradient measures 15.7 mmHg. Aortic valve area, by VTI  measures 1.81 cm. Pulmonic Valve: The pulmonic valve was not assessed. Pulmonic valve regurgitation is not visualized. Aorta: The aortic root was not well visualized and the aortic root is normal in size and structure. IAS/Shunts: The interatrial septum was not well visualized.  LEFT VENTRICLE PLAX 2D LVIDd:         3.90 cm  Diastology LVIDs:         2.60 cm  LV e' medial:  9.36 cm/s LV PW:         0.70 cm  LV E/e' medial:  11.3 LV IVS:        0.70 cm  LV e' lateral:   8.49 cm/s LVOT diam:     1.90 cm  LV E/e' lateral: 12.5 LV SV:         61  LV SV Index:   35 LVOT Area:     2.84 cm  RIGHT VENTRICLE RV Basal diam:  2.70 cm RV S prime:     14.40 cm/s TAPSE (M-mode): 2.3 cm LEFT ATRIUM             Index       RIGHT ATRIUM           Index LA diam:        3.40 cm 1.97 cm/m  RA Area:     13.20 cm LA Vol (A2C):   32.5 ml 18.82 ml/m RA Volume:   30.10 ml  17.43 ml/m LA Vol (A4C):   20.8 ml 12.05 ml/m LA Biplane Vol: 27.5 ml 15.93 ml/m  AORTIC VALVE AV Area (Vmax):    1.69 cm AV Area (Vmean):   1.76 cm AV Area (VTI):     1.81 cm AV Vmax:           198.00 cm/s AV Vmean:          130.000 cm/s AV VTI:            0.336 m AV Peak Grad:      15.7 mmHg AV Mean Grad:      8.0 mmHg LVOT Vmax:         118.00 cm/s LVOT Vmean:        80.500 cm/s LVOT VTI:          0.215 m LVOT/AV VTI ratio: 0.64 AI PHT:            545 msec  AORTA Ao Root diam: 3.30 cm MITRAL VALVE                TRICUSPID VALVE MV Area (PHT): 3.40 cm     TR Peak grad:   30.0 mmHg MV Area VTI:   2.11 cm     TR Vmax:        274.00 cm/s MV Peak grad:  4.4 mmHg MV Mean grad:  2.0 mmHg     SHUNTS MV Vmax:       1.05 m/s     Systemic VTI:  0.22 m MV Vmean:      75.1 cm/s    Systemic Diam: 1.90 cm MV Decel Time: 223 msec MV E velocity: 106.00 cm/s MV A velocity: 85.70 cm/s MV E/A ratio:  1.24 Bartholome Bill MD Electronically signed by Bartholome Bill MD Signature Date/Time: 05/24/2020/4:25:11 PM    Final         Scheduled Meds: . vitamin C  500 mg Oral Daily  . aspirin EC  81 mg Oral Daily  . carbidopa-levodopa  0.5 tablet Oral TID WC  . cholecalciferol  5,000 Units Oral Daily  . donepezil  10 mg Oral QHS  . enoxaparin (LOVENOX) injection  40 mg Subcutaneous Q24H  . famotidine  20 mg Oral BID  . levothyroxine  50 mcg Oral Q0600  . melatonin  10 mg Oral QHS  . mirtazapine  45 mg Oral QHS  . vitamin B-12  5,000 mcg Oral Daily  . zinc sulfate  220 mg Oral Daily  Continuous Infusions: . remdesivir 100 mg in NS 100 mL 100 mg (05/24/20 1126)     LOS: 2 days    Time spent: 25  minutes    Sidney Ace, MD [facility] Pager [phone]  If 7PM-7AM, please contact night-coverage 05/25/2020, 10:57 AM

## 2020-05-26 DIAGNOSIS — U071 COVID-19: Secondary | ICD-10-CM | POA: Diagnosis not present

## 2020-05-26 LAB — BASIC METABOLIC PANEL
Anion gap: 12 (ref 5–15)
BUN: 41 mg/dL — ABNORMAL HIGH (ref 8–23)
CO2: 27 mmol/L (ref 22–32)
Calcium: 9.4 mg/dL (ref 8.9–10.3)
Chloride: 107 mmol/L (ref 98–111)
Creatinine, Ser: 1.59 mg/dL — ABNORMAL HIGH (ref 0.61–1.24)
GFR, Estimated: 44 mL/min — ABNORMAL LOW (ref >60)
Glucose, Bld: 113 mg/dL — ABNORMAL HIGH (ref 70–99)
Potassium: 4.1 mmol/L (ref 3.5–5.1)
Sodium: 146 mmol/L — ABNORMAL HIGH (ref 135–145)

## 2020-05-26 LAB — CBC WITH DIFFERENTIAL/PLATELET
Abs Immature Granulocytes: 0.01 10*3/uL (ref 0.00–0.07)
Basophils Absolute: 0 10*3/uL (ref 0.0–0.1)
Basophils Relative: 0 %
Eosinophils Absolute: 0 10*3/uL (ref 0.0–0.5)
Eosinophils Relative: 0 %
HCT: 40.7 % (ref 39.0–52.0)
Hemoglobin: 13 g/dL (ref 13.0–17.0)
Immature Granulocytes: 0 %
Lymphocytes Relative: 15 %
Lymphs Abs: 0.9 10*3/uL (ref 0.7–4.0)
MCH: 29.5 pg (ref 26.0–34.0)
MCHC: 31.9 g/dL (ref 30.0–36.0)
MCV: 92.5 fL (ref 80.0–100.0)
Monocytes Absolute: 0.5 10*3/uL (ref 0.1–1.0)
Monocytes Relative: 9 %
Neutro Abs: 4.7 10*3/uL (ref 1.7–7.7)
Neutrophils Relative %: 76 %
Platelets: 221 10*3/uL (ref 150–400)
RBC: 4.4 MIL/uL (ref 4.22–5.81)
RDW: 14.3 % (ref 11.5–15.5)
Smear Review: NORMAL
WBC: 6.2 10*3/uL (ref 4.0–10.5)
nRBC: 0 % (ref 0.0–0.2)

## 2020-05-26 LAB — C-REACTIVE PROTEIN: CRP: 21.2 mg/dL — ABNORMAL HIGH (ref <1.0)

## 2020-05-26 NOTE — Progress Notes (Signed)
SLP Cancellation Note  Patient Details Name: Blake Bates MRN: [id number] DOB: 1941-04-16   Cancelled treatment:       Reason Eval/Treat Not Completed:  (chart reviewed; consulted NSG). Per chart and NSG report, pt is improved today. NSG reported pt taking po's more easily vs yesterday; improved engagement overall. Sinemet and PPI Medications have been initiated to address pt's issues(see chart notes, history). Suspect pt may have some Reflux issues; has baseline Lewy body Dementia and Parkinson's dis. But not taking Sinemet prior to admit.  ST services recommends continue w/ current dysphagia diet at this time for safety and increased oral intake(conservation of energy) in light of medical and Cognitive deficits/decline; general aspiration precautions and feeding support. Pills in puree. Will f/u on Monday for trials to upgrade diet if appropriate. NSG agreed.      Orinda Kenner, MS, CCC-SLP Speech Language Pathologist Rehab Services [phone] [facility] 05/26/2020, 3:47 PM

## 2020-05-26 NOTE — Progress Notes (Signed)
Physical Therapy Treatment Patient Details Name: Blake Bates MRN: [id number] DOB: 1940/09/12 Today's Date: 05/26/2020    History of Present Illness "Blake Bates" Davies is a 22yoM who comes [facility] on [facility]/22/20 c inability to walk, acute on chronic AMS. Patient tested (+) COVID19. PMH: Lewy Bodies, PD, baseline AMB difficulty with RW, HLD, hypoTSH, osteoporosis. Pt febrile in ED. CXR revealing of new mid thoracic compression fracture 40% height loss, chronicity unknown.    PT Comments    Wife in room.  Pt participated in [place] AA/PROM 2 x 10 for supine ex.  OOB deferred due to level of assist and inability to coordinate care.    Pt had finished breakfast with wife prior to admission.  Pt coughing and sounded congested.  Trying to spit up phlegm  But unable to cough it up.  Asked wife if nursing was suctioning him "I don't want him suctioned".  Relayed to nurse and she confirmed wife declining suction.   Follow Up Recommendations  SNF;Supervision for mobility/OOB     Equipment Recommendations  Rolling walker with 5" wheels    Recommendations for Other Services       Precautions / Restrictions Precautions Precautions: Fall Precaution Comments: New incidental finding of mid thoracic compression fracture, age indeterminate Restrictions Weight Bearing Restrictions: No    Mobility  Bed Mobility               General bed mobility comments: deferred due to +2 assist for mobility and unable to coordinate care  Transfers                    Ambulation/Gait                 Stairs             Wheelchair Mobility    Modified Rankin (Stroke Patients Only)       Balance                                            Cognition Arousal/Alertness: Awake/alert Behavior During Therapy: Flat affect Overall Cognitive Status: History of cognitive impairments - at baseline                                        Exercises       General Comments        Pertinent Vitals/Pain Pain Assessment: No/denies pain    Home Living                      Prior Function            PT Goals (current goals can now be found in the care plan section) Progress towards PT goals: Not progressing toward goals - comment    Frequency    Min 2X/week      PT Plan Current plan remains appropriate    Co-evaluation              AM-PAC PT "6 Clicks" Mobility   Outcome Measure  Help needed turning from your back to your side while in a flat bed without using bedrails?: Total Help needed moving from lying on your back to sitting on the side of a flat bed without using bedrails?: Total Help needed moving  to and from a bed to a chair (including a wheelchair)?: Total Help needed standing up from a chair using your arms (e.g., wheelchair or bedside chair)?: Total Help needed to walk in hospital room?: Total Help needed climbing 3-5 steps with a railing? : Total 6 Click Score: 6    End of Session Equipment Utilized During Treatment: Gait belt Activity Tolerance: Patient limited by fatigue;Treatment limited secondary to agitation Patient left: in bed;with call bell/phone within reach;with family/visitor present Nurse Communication: Other (comment)       Time: 9323-5573 PT Time Calculation (min) (ACUTE ONLY): 25 min  Charges:  $Therapeutic Exercise: 23-37 mins                    Chesley Noon, PTA 05/26/20, 10:58 AM

## 2020-05-26 NOTE — Progress Notes (Signed)
PROGRESS NOTE    Blake Bates  EQA:[id number] DOB: 03-Dec-1940 DOA: 05/22/2020 PCP: [name], DO   Brief Narrative:   80 year old Caucasian male history of Parkinson's and Lewy body dementia who presented to the emergency room with acute onset of altered mentation and weakness.  Per the patient's wife his change occurred very acutely.  At baseline he is able to ambulate with a walker.  Today he was so weak he could barely get up out of bed.  The patient has been vaccinated against [id number] and had a booster shot on 02/07/2020.  On admission he was found to be COVID-positive.  No pneumonia noted on chest x-ray.  No oxygen requirement.  Patient did develop nonsustained narrow complex tachycardia, suspect SVT versus atrial fibrillation.  Unfortunately we could not capture this on twelve-lead EKG.  Since this one episode, he has not had recurrence of his cardiac symptoms.   Assessment & Plan:   Active Problems:   COVID-19  COVID-19 infection Acute metabolic encephalopathy secondary to above Sepsis ruled out Patient is vaccinated and booster.   He has no pneumonia on chest x-ray however has had intermittent fevers.   Last fever noted on 1222 at 2019.   No fever since. Encephalopathy appears to be improving Plan: Continue remdesivir, dose 4/5 Hold steroids at this time given lack of hypoxia and pneumonia Monitor inflammatory markers Oxygen if needed Every 4 hours neurochecks Frequent reorienting measures  Parkinson's Lewy body dementia Patient has had acute deterioration of his neurologic status, decreased level of mobility, increased tremors since the day of admission.  This is likely an exacerbation of his underlying Parkinson's/Lewy body dementia in the setting of acute COVID infection.  His neurologic status seems to be poor but slowly improving.    1/15: I did discussion with the wife this morning.  Apparently the patient was not taking his home Sinemet  because of some GI upset.  She states the patient was previously put on Sinemet approximately 6 months ago but his adherence to this medication has been poor because of his poor p.o. tolerance.  I had a long discussion with her and explained that is very important that he takes his Sinemet and this is likely the most important medication that he has.  She expressed understanding. Plan: Continue Aricept at home dose Continue Sinemet at home dose Up as tolerated with nursing and therapy Neurologic symptoms improving.   We will formally consult neurology if neurologic status deteriorates  Hypothyroidism Continue Synthroid  Stage IIIb chronic kidney disease No IV fluids at this time Creatinine at or near baseline Will follow Chem-7  DVT prophylaxis: Lovenox Code Status: Full Family Communication: Wife [name] [phone] bedside on 05/26/2020 Disposition Plan: Status is: Inpatient  Remains inpatient appropriate because:Inpatient level of care appropriate due to severity of illness   Dispo: The patient is from: Home              Anticipated d/c is to: SNF              Anticipated d/c date is: 2 days              Patient currently is not medically stable to d/c.   Neurologic function improving.  Anticipate medical readiness for discharge in 48 hours.  Anticipate discharge to skilled nursing facility.   Consultants:   None  Procedures:   None  Antimicrobials:   Remdesivir   Subjective: Patient seen and examined.  Sitting up in bed.  Getting  fed by wife.  Much less tremulous today.  Answers questions appropriately.  Objective: Vitals:   05/25/20 2040 05/25/20 2339 05/26/20 0634 05/26/20 0747  BP: (!) 149/96 (!) 146/72 (!) 130/99 (!) 145/88  Pulse: 74 80 71 70  Resp: 16 16 15 18   Temp: 98.2 F (36.8 C) 98.9 F (37.2 C) 98.2 F (36.8 C) 97.9 F (36.6 C)  TempSrc: Oral Axillary    SpO2: 95% 93% 96% 98%  Weight:      Height:        Intake/Output Summary (Last 24  hours) at 05/26/2020 1116 Last data filed at 05/26/2020 0559 Gross per 24 hour  Intake -  Output 500 ml  Net -500 ml   Filed Weights   05/22/20 1802  Weight: 56.7 kg    Examination:  General exam: Mildly tremulous.  Sitting up in bed.  No apparent distress Respiratory system: Clear to auscultation.  Normal work of breathing.  Room air Cardiovascular system: Tachycardic, regular rhythm, no murmurs Gastrointestinal system: Abdomen is nondistended, soft and nontender. No organomegaly or masses felt. Normal bowel sounds heard. Central nervous system: Alert, oriented x2, tremulous but no focal deficits. Extremities: Symmetrically decreased power upper and lower extremities Skin: No rashes, lesions or ulcers Psychiatry: Judgement and insight appear impaired. Mood & affect flattened.     Data Reviewed: I have personally reviewed following labs and imaging studies  CBC: Recent Labs  Lab 05/22/20 1805 05/24/20 1026 05/25/20 0459 05/26/20 0548  WBC 7.3 6.9 6.4 6.2  NEUTROABS 6.0 5.5 5.0 4.7  HGB 11.9* 13.0 12.7* 13.0  HCT 36.9* 40.2 38.1* 40.7  MCV 92.3 91.6 90.1 92.5  PLT 194 197 214 694   Basic Metabolic Panel: Recent Labs  Lab 05/22/20 1805 05/23/20 0423 05/24/20 1026 05/25/20 0459 05/26/20 0548  NA 134* 139 141 141 146*  K 4.1 3.9 4.6 3.8 4.1  CL 100 105 105 107 107  CO2 27 24 22 22 27   GLUCOSE 105* 88 110* 138* 113*  BUN 38* 34* 29* 35* 41*  CREATININE 1.61* 1.58* 1.59* 1.63* 1.59*  CALCIUM 9.0 8.7* 9.1 8.9 9.4   GFR: Estimated Creatinine Clearance: 30.2 mL/min (A) (by C-G formula based on SCr of 1.59 mg/dL (H)). Liver Function Tests: Recent Labs  Lab 05/22/20 1805  AST 27  ALT 21  ALKPHOS 82  BILITOT 0.8  PROT 7.3  ALBUMIN 4.0   No results for input(s): LIPASE, AMYLASE in the last 168 hours. No results for input(s): AMMONIA in the last 168 hours. Coagulation Profile: Recent Labs  Lab 05/22/20 1805  INR 0.9   Cardiac Enzymes: No results for  input(s): CKTOTAL, CKMB, CKMBINDEX, TROPONINI in the last 168 hours. BNP (last 3 results) No results for input(s): PROBNP in the last 8760 hours. HbA1C: No results for input(s): HGBA1C in the last 72 hours. CBG: No results for input(s): GLUCAP in the last 168 hours. Lipid Profile: No results for input(s): CHOL, HDL, LDLCALC, TRIG, CHOLHDL, LDLDIRECT in the last 72 hours. Thyroid Function Tests: No results for input(s): TSH, T4TOTAL, FREET4, T3FREE, THYROIDAB in the last 72 hours. Anemia Panel: No results for input(s): VITAMINB12, FOLATE, FERRITIN, TIBC, IRON, RETICCTPCT in the last 72 hours. Sepsis Labs: Recent Labs  Lab 05/22/20 1805 05/23/20 0423 05/24/20 1026  PROCALCITON  --  0.20 0.16  LATICACIDVEN 1.1  --   --     Recent Results (from the past 240 hour(s))  Urine Culture     Status: None   Collection Time:  05/22/20 11:59 PM   Specimen: Urine, Random  Result Value Ref Range Status   Specimen Description   Final    URINE, RANDOM Performed at [facility], [address], [place], [place] [address]    Special Requests   Final    Normal Performed at [facility], [address] [place], [place] [address]    Culture   Final    NO GROWTH Performed at [facility], [place] [address] [place], [place] [address]    Report Status 05/24/2020 FINAL  Final         Radiology Studies: No results found.      Scheduled Meds: . vitamin C  500 mg Oral Daily  . aspirin EC  81 mg Oral Daily  . carbidopa-levodopa  0.5 tablet Oral TID WC  . cholecalciferol  5,000 Units Oral Daily  . donepezil  10 mg Oral QHS  . enoxaparin (LOVENOX) injection  40 mg Subcutaneous Q24H  . famotidine  20 mg Oral Daily  . levothyroxine  50 mcg Oral Q0600  . melatonin  10 mg Oral QHS  . mirtazapine  45 mg Oral QHS  . pantoprazole (PROTONIX) IV  40 mg Intravenous Q24H  . vitamin B-12  5,000 mcg Oral Daily  . zinc sulfate  220 mg Oral Daily   Continuous  Infusions: . remdesivir 100 mg in NS 100 mL 100 mg (05/26/20 1111)     LOS: 3 days    Time spent: 25 minutes    Sidney Ace, MD [facility] Pager [phone]  If 7PM-7AM, please contact night-coverage 05/26/2020, 11:16 AM

## 2020-05-27 DIAGNOSIS — U071 COVID-19: Secondary | ICD-10-CM | POA: Diagnosis not present

## 2020-05-27 LAB — BASIC METABOLIC PANEL
Anion gap: 12 (ref 5–15)
BUN: 44 mg/dL — ABNORMAL HIGH (ref 8–23)
CO2: 24 mmol/L (ref 22–32)
Calcium: 9.1 mg/dL (ref 8.9–10.3)
Chloride: 114 mmol/L — ABNORMAL HIGH (ref 98–111)
Creatinine, Ser: 1.4 mg/dL — ABNORMAL HIGH (ref 0.61–1.24)
GFR, Estimated: 51 mL/min — ABNORMAL LOW (ref >60)
Glucose, Bld: 116 mg/dL — ABNORMAL HIGH (ref 70–99)
Potassium: 3.7 mmol/L (ref 3.5–5.1)
Sodium: 150 mmol/L — ABNORMAL HIGH (ref 135–145)

## 2020-05-27 LAB — C-REACTIVE PROTEIN: CRP: 22.5 mg/dL — ABNORMAL HIGH (ref <1.0)

## 2020-05-27 NOTE — Progress Notes (Signed)
PROGRESS NOTE    Blake Bates  [id number] DOB: [birth year] DOA: 05/22/2020 PCP: [name], DO   Brief Narrative:   80 year old Caucasian male history of Parkinson's and Lewy body dementia who presented to the emergency room with acute onset of altered mentation and weakness.  Per the patient's wife his change occurred very acutely.  At baseline he is able to ambulate with a walker.  Today he was so weak he could barely get up out of bed.  The patient has been vaccinated against [id number] and had a booster shot on 02/07/2020.  On admission he was found to be COVID-positive.  No pneumonia noted on chest x-ray.  No oxygen requirement.  Patient did develop nonsustained narrow complex tachycardia, suspect SVT versus atrial fibrillation.  Unfortunately we could not capture this on twelve-lead EKG.  Since this one episode, he has not had recurrence of his cardiac symptoms.   Assessment & Plan:   Active Problems:   COVID-19  COVID-19 infection Acute metabolic encephalopathy secondary to above Sepsis ruled out Patient is vaccinated and booster.   He has no pneumonia on chest x-ray however has had intermittent fevers.   Last fever noted on 05/23/20 at 2019.   No fever since. Encephalopathy appears to be improving Plan: Continue remdesivir, dose 5/5 Hold steroids at this time given lack of hypoxia and pneumonia Monitor inflammatory markers Oxygen if needed Every 4 hours neurochecks Frequent reorienting measures  Parkinson's Lewy body dementia Patient has had acute deterioration of his neurologic status, decreased level of mobility, increased tremors since the day of admission.  This is likely an exacerbation of his underlying Parkinson's/Lewy body dementia in the setting of acute COVID infection.  His neurologic status seems to be poor but slowly improving.    1/15: I did discussion with the wife this morning.  Apparently the patient was not taking his home Sinemet  because of some GI upset.  She states the patient was previously put on Sinemet approximately 6 months ago but his adherence to this medication has been poor because of his poor p.o. tolerance.  I had a long discussion with her and explained that is very important that he takes his Sinemet and this is likely the most important medication that he has.  She expressed understanding. -Parkinson symptoms appear to be improving Plan: Continue Sinemet 0.5 tabs 3 times daily.  Ensure the patient misses 0 doses Continue Aricept per home dose Up as tolerated with nursing and therapy Neurologic symptoms improving.   We will formally consult neurology if neurologic status deteriorates  Hypothyroidism Continue Synthroid  Stage IIIb chronic kidney disease No IV fluids at this time Creatinine at or near baseline Will follow Chem-7  DVT prophylaxis: Lovenox Code Status: Full Family Communication: Wife [name] [phone] bedside on 05/26/2020 Disposition Plan: Status is: Inpatient  Remains inpatient appropriate because:Inpatient level of care appropriate due to severity of illness   Dispo: The patient is from: Home              Anticipated d/c is to: SNF              Anticipated d/c date is: 2 days              Patient currently is not medically stable to d/c.   Neurologic function continues to slowly improve.  Patient completing his remdesivir course of treatment today.  Anticipate medical readiness for discharge in 48 hours.  Anticipated disposition to skilled nursing facility   Consultants:  None  Procedures:   None  Antimicrobials:   Remdesivir   Subjective: Patient seen and examined.  Less tremulous today.  Lying in bed.  Answers questions appropriately.  Objective: Vitals:   05/27/20 0029 05/27/20 0550 05/27/20 0759 05/27/20 1129  BP: (!) 160/77 (!) 155/81 (!) 160/80 (!) 165/77  Pulse: 72 76 71 72  Resp: 18 19 16 19   Temp: 97.6 F (36.4 C) 97.9 F (36.6 C) 97.7 F (36.5  C) 97.8 F (36.6 C)  TempSrc:      SpO2:  92% 98% 98%  Weight:      Height:       No intake or output data in the 24 hours ending 05/27/20 1212 Filed Weights   05/22/20 1802  Weight: 56.7 kg    Examination:  General exam: Mildly tremulous.  Sitting up in bed.  No apparent distress Respiratory system: Clear to auscultation.  Normal work of breathing.  Room air Cardiovascular system: Tachycardic, regular rhythm, no murmurs Gastrointestinal system: Abdomen is nondistended, soft and nontender. No organomegaly or masses felt. Normal bowel sounds heard. Central nervous system: Alert, oriented x2, tremulous but no focal deficits. Extremities: Symmetrically decreased power upper and lower extremities Skin: No rashes, lesions or ulcers Psychiatry: Judgement and insight appear impaired. Mood & affect flattened.     Data Reviewed: I have personally reviewed following labs and imaging studies  CBC: Recent Labs  Lab 05/22/20 1805 05/24/20 1026 05/25/20 0459 05/26/20 0548  WBC 7.3 6.9 6.4 6.2  NEUTROABS 6.0 5.5 5.0 4.7  HGB 11.9* 13.0 12.7* 13.0  HCT 36.9* 40.2 38.1* 40.7  MCV 92.3 91.6 90.1 92.5  PLT 194 197 214 578   Basic Metabolic Panel: Recent Labs  Lab 05/23/20 0423 05/24/20 1026 05/25/20 0459 05/26/20 0548 05/27/20 0528  NA 139 141 141 146* 150*  K 3.9 4.6 3.8 4.1 3.7  CL 105 105 107 107 114*  CO2 24 22 22 27 24   GLUCOSE 88 110* 138* 113* 116*  BUN 34* 29* 35* 41* 44*  CREATININE 1.58* 1.59* 1.63* 1.59* 1.40*  CALCIUM 8.7* 9.1 8.9 9.4 9.1   GFR: Estimated Creatinine Clearance: 33.8 mL/min (A) (by C-G formula based on SCr of 1.4 mg/dL (H)). Liver Function Tests: Recent Labs  Lab 05/22/20 1805  AST 27  ALT 21  ALKPHOS 82  BILITOT 0.8  PROT 7.3  ALBUMIN 4.0   No results for input(s): LIPASE, AMYLASE in the last 168 hours. No results for input(s): AMMONIA in the last 168 hours. Coagulation Profile: Recent Labs  Lab 05/22/20 1805  INR 0.9   Cardiac  Enzymes: No results for input(s): CKTOTAL, CKMB, CKMBINDEX, TROPONINI in the last 168 hours. BNP (last 3 results) No results for input(s): PROBNP in the last 8760 hours. HbA1C: No results for input(s): HGBA1C in the last 72 hours. CBG: No results for input(s): GLUCAP in the last 168 hours. Lipid Profile: No results for input(s): CHOL, HDL, LDLCALC, TRIG, CHOLHDL, LDLDIRECT in the last 72 hours. Thyroid Function Tests: No results for input(s): TSH, T4TOTAL, FREET4, T3FREE, THYROIDAB in the last 72 hours. Anemia Panel: No results for input(s): VITAMINB12, FOLATE, FERRITIN, TIBC, IRON, RETICCTPCT in the last 72 hours. Sepsis Labs: Recent Labs  Lab 05/22/20 1805 05/23/20 0423 05/24/20 1026  PROCALCITON  --  0.20 0.16  LATICACIDVEN 1.1  --   --     Recent Results (from the past 240 hour(s))  Urine Culture     Status: None   Collection Time: 05/22/20 11:59  PM   Specimen: Urine, Random  Result Value Ref Range Status   Specimen Description   Final    URINE, RANDOM Performed at [facility], [address], [place], [place] [address]    Special Requests   Final    Normal Performed at [facility], [address] [place] [place], [place] [address]    Culture   Final    NO GROWTH Performed at [facility], [place] [address], [place], [place] [address]    Report Status 05/24/2020 FINAL  Final         Radiology Studies: No results found.      Scheduled Meds: . vitamin C  500 mg Oral Daily  . aspirin EC  81 mg Oral Daily  . carbidopa-levodopa  0.5 tablet Oral TID WC  . cholecalciferol  5,000 Units Oral Daily  . donepezil  10 mg Oral QHS  . enoxaparin (LOVENOX) injection  40 mg Subcutaneous Q24H  . famotidine  20 mg Oral Daily  . levothyroxine  50 mcg Oral Q0600  . melatonin  10 mg Oral QHS  . mirtazapine  45 mg Oral QHS  . pantoprazole (PROTONIX) IV  40 mg Intravenous Q24H  . vitamin B-12  5,000 mcg Oral Daily  . zinc sulfate  220 mg Oral Daily    Continuous Infusions: . remdesivir 100 mg in NS 100 mL Stopped (05/26/20 2043)     LOS: 4 days    Time spent: 25 minutes    Sidney Ace, MD [facility] Pager [phone]  If 7PM-7AM, please contact night-coverage 05/27/2020, 12:12 PM

## 2020-05-28 DIAGNOSIS — U071 COVID-19: Secondary | ICD-10-CM | POA: Diagnosis not present

## 2020-05-28 LAB — BASIC METABOLIC PANEL
Anion gap: 10 (ref 5–15)
BUN: 46 mg/dL — ABNORMAL HIGH (ref 8–23)
CO2: 27 mmol/L (ref 22–32)
Calcium: 9.4 mg/dL (ref 8.9–10.3)
Chloride: 116 mmol/L — ABNORMAL HIGH (ref 98–111)
Creatinine, Ser: 1.54 mg/dL — ABNORMAL HIGH (ref 0.61–1.24)
GFR, Estimated: 45 mL/min — ABNORMAL LOW (ref >60)
Glucose, Bld: 128 mg/dL — ABNORMAL HIGH (ref 70–99)
Potassium: 3.7 mmol/L (ref 3.5–5.1)
Sodium: 153 mmol/L — ABNORMAL HIGH (ref 135–145)

## 2020-05-28 LAB — C-REACTIVE PROTEIN: CRP: 20.4 mg/dL — ABNORMAL HIGH (ref <1.0)

## 2020-05-28 NOTE — Progress Notes (Signed)
All medications marked as not given were refused by wife at bedside. Patient is alert. No changes from previous assessment documentation.  Madlyn Frankel, RN

## 2020-05-28 NOTE — Progress Notes (Signed)
Speech Language Pathology Treatment: Dysphagia  Patient Details Name: Blake Bates MRN: [id number] DOB: 1941-02-02 Today's Date: 05/28/2020 Time: 1115-1205 SLP Time Calculation (min) (ACUTE ONLY): 50 min  Assessment / Plan / Recommendation Clinical Impression  Pt seen today for ongoing assessment of swallowing; trials to upgrade his diet if appropriate. Pt appears to exhibit less Belching, Hiccups and phlegm per report. He continues to need Max assist w/ all tasks; baseline Lewy body Dementia. Wife present this session stating that he did "really well" w/ the Purees in the dysphagia diet given over the weekened.  Pt appears to present w/ oropharyngeal phase dysphagia in light of Significantly declined Cognitive status; Lewy-body Dementia w/ Parkinson's Dis.(has Not been taking Sinemet at home) per Wife's report. These factors impact his overall awareness/engagement and safety during po tasks which increases risk for aspiration, choking. Pt has risk for aspiration as well as concern for meeting nutritional needs fully, safely. Pt has initiated Sinemet which may be helpful. When following aspiration precautions and given feeding assistance of modified consistencies, pt appeared to tolerate bites/sips of thin liquids via Straw and minced, softened solids given w/out immediate, overt s/s of aspiration noted except for 1x trial when pt took too Large of a sip. Instructed and model were given to Wife to Pinch straw quickly to limit bolus size to single, smaller sips. He requires Mod-Max tactile/verbal/ visual cues for orientation to bolus presentation, and during feeding support. No decline in vocal quality during few verbalizations, and no decline in respiratory status during/post trials given. Laryngeal excursion appeared adequate. Oral phase was c/b slow attention to bolus trials w/ min increased Time needed for Mastication of the increased textured trials and for oral clearing of the puree/minced  boluses given. Oral prep appeared more appropriate this session.  D/t pt's declined Cognitive status, his risk for aspiration, and presentation w/ po's this session, recommend initiation of a trial of dysphagia level 2(minced foods) diet w/ thin liquids; strict aspiration precautions; reduce Distractions during meals and only give po's when pt is calm and awake to participate; Pinch Straw to limit bolus size. Pills Crushed in Puree as able for safer swallowing and conservation of energy if many pills vs attempt Whole in Puree. Support w/ feeding at meals -- check for oral clearing during/post intake. NSG updated. ST services recommends follow w/ Palliative Care for [place] as he has had weight loss and a decline in general status overall per chart notes. ST services will follow pt's status while admited giving education and support. Largely suspect that pt's Dementia and impact of illness could continue to hamper oral intake and upgrade of diet. Precautions posted at room. NSG and Wife updated, agreed.  Much education w/ Wife on strategies of Pinched straw to limit bolus size when drinking; aspiration precautions; positioning upright; reducing distractions during po's; Pills in puree always to reduce risk for aspiration after session.     HPI HPI: Pt is a 80 y.o. Caucasian male with a known history of asthma, dyslipidemia, hypothyroidism, Lewy body Dementia and Parkinson's disease, who presented to the emergency room with acute onset of altered mental status with delirium and confusion that started today.  He is usually able to ambulate with a walker at his baseline and today he was feeling so weak he could not walk.  The patient has been vaccinated against [id number] and had his booster shot on 02/07/2020.  He has been having cough productive of clear sputum and rhinorrhea without wheezing or dyspnea.  No  nausea or vomiting or diarrhea.  Pt is followed by Chronic Care Management; has had OT/PT and aide in the home  for support.  He has a much reduced oral diet of honey nut cheerios w/ attempts at Ensure per report.  Weight loss.  Wife described Significant episodes of Phlegm pt expectorates in the night and morning/day.  No dx'd Reflux/GERD; not on PPI at home.   Covid+.      SLP Plan  Continue with current plan of care       Recommendations  Diet recommendations: Dysphagia 2 (fine chop);Thin liquid Liquids provided via: Straw;Cup (monitor - pinch to limit large sips) Medication Administration: Crushed with puree Supervision: Staff to assist with self feeding;Full supervision/cueing for compensatory strategies Compensations: Minimize environmental distractions;Slow rate;Small sips/bites;Lingual sweep for clearance of pocketing;Multiple dry swallows after each bite/sip;Follow solids with liquid Postural Changes and/or Swallow Maneuvers: Seated upright 90 degrees;Upright 30-60 min after meal                General recommendations:  (dietician f/u) Oral Care Recommendations: Oral care BID;Oral care before and after PO;Staff/trained caregiver to provide oral care Follow up Recommendations:  (TBD) SLP Visit Diagnosis: Dysphagia, oropharyngeal phase (R13.12) Plan: Continue with current plan of care       GO                  Orinda Kenner, [place], [place] Pathologist Rehab Services [phone] [facility] 05/28/2020, 4:09 PM

## 2020-05-28 NOTE — Progress Notes (Signed)
PROGRESS NOTE    Blake Bates  VZD:[id number] DOB: [birth year] DOA: 05/22/2020 PCP: Blake Hauser, DO   Brief Narrative:   80 year old Caucasian male history of Parkinson's and Lewy body dementia who presented to the emergency room with acute onset of altered mentation and weakness.  Per the patient's wife his change occurred very acutely.  At baseline he is able to ambulate with a walker.  Today he was so weak he could barely get up out of bed.  The patient has been vaccinated against [id number] and had a booster shot on 02/07/2020.  On admission he was found to be COVID-positive.  No pneumonia noted on chest x-ray.  No oxygen requirement.  Patient did develop nonsustained narrow complex tachycardia, suspect SVT versus atrial fibrillation.  Unfortunately we could not capture this on twelve-lead EKG.  Since this one episode, he has not had recurrence of his cardiac symptoms.  After several discussions with wife we have found that the patient was prescribed Sinemet approximately 6 months ago however has barely started it due to GI upset as result of this medication.  I explained the importance of Sinemet adherence at length with wife at bedside.  She expressed understanding.   Assessment & Plan:   Active Problems:   COVID-19  COVID-19 infection Acute metabolic encephalopathy secondary to above Sepsis ruled out Patient is vaccinated and booster.   He has no pneumonia on chest x-ray however has had intermittent fevers.   Last fever noted on 05/23/20 at 2019.   No fever since. Encephalopathy appears to be improving Completed 5 days of remdesivir Plan: Hold steroids at this time given lack of hypoxia and pneumonia Monitor inflammatory markers Oxygen if needed Every 4 hours neurochecks Frequent reorienting measures  Parkinson's Lewy body dementia Patient has had acute deterioration of his neurologic status, decreased level of mobility, increased tremors since the day of  admission.  This is likely an exacerbation of his underlying Parkinson's/Lewy body dementia in the setting of acute COVID infection.  His neurologic status seems to be poor but slowly improving.    1/15: I did discussion with the wife this morning.  Apparently the patient was not taking his home Sinemet because of some GI upset.  She states the patient was previously put on Sinemet approximately 6 months ago but his adherence to this medication has been poor because of his poor p.o. tolerance.  I had a long discussion with her and explained that is very important that he takes his Sinemet and this is likely the most important medication that he has.  She expressed understanding. -Parkinson symptoms appear to be improving Plan: Continue Sinemet 0.5 tabs 3 times daily.  Ensure the patient misses 0 doses Continue Aricept per home dose Up as tolerated with nursing and therapy Neurologic symptoms improving.   We will formally consult neurology if neurologic status deteriorates Patient's wife wants to take patient back home.  His movement disorder is not yet at baseline.  Mentation appears to be improving.  At this time continue to recommend skilled nursing facility however if he continues to improve going home with home health is not unreasonable.  Hypothyroidism Continue Synthroid  Stage IIIb chronic kidney disease No IV fluids at this time Creatinine at or near baseline Will follow Chem-7  DVT prophylaxis: Lovenox Code Status: Full Family Communication: Wife Blake Bates [phone] bedside on 05/28/2020 Disposition Plan: Status is: Inpatient  Remains inpatient appropriate because:Inpatient level of care appropriate due to severity of illness   Dispo:  The patient is from: Home              Anticipated d/c is to: SNF              Anticipated d/c date is: 2 days              Patient currently is not medically stable to d/c.   Mental status continues to improve.  Movement disorder still present.   Mobility not at baseline.  At this time patient is appropriate for skilled nursing facility however wife wants to take patient back home.  If patient continues to progress neurologically this is not unreasonable.  We will have to show continued progression.   Consultants:   None  Procedures:   None  Antimicrobials:   Remdesivir   Subjective: Patient seen and examined.  Sitting up in bed getting fed by his wife.  Less tremulous.  Answers questions appropriately.  Continues to appear weak.  Objective: Vitals:   05/27/20 1529 05/27/20 1852 05/27/20 2146 05/28/20 0941  BP: (!) 126/111  (!) 142/78 (!) 177/93  Pulse: 74 68 70 88  Resp: 20  18 18   Temp: 97.8 F (36.6 C)  (!) 97.5 F (36.4 C) 98.1 F (36.7 C)  TempSrc:   Axillary Oral  SpO2: (!) 75% 98% 99% 97%  Weight:      Height:       No intake or output data in the 24 hours ending 05/28/20 1151 Filed Weights   05/22/20 1802  Weight: 56.7 kg    Examination:  General exam: Mildly tremulous.  Sitting up in bed.  No apparent distress Respiratory system: Clear to auscultation.  Normal work of breathing.  Room air Cardiovascular system: Tachycardic, regular rhythm, no murmurs Gastrointestinal system: Abdomen is nondistended, soft and nontender. No organomegaly or masses felt. Normal bowel sounds heard. Central nervous system: Alert, oriented x2, tremulous but no focal deficits. Extremities: Symmetrically decreased power upper and lower extremities Skin: No rashes, lesions or ulcers Psychiatry: Judgement and insight appear impaired. Mood & affect flattened.     Data Reviewed: I have personally reviewed following labs and imaging studies  CBC: Recent Labs  Lab 05/22/20 1805 05/24/20 1026 05/25/20 0459 05/26/20 0548  WBC 7.3 6.9 6.4 6.2  NEUTROABS 6.0 5.5 5.0 4.7  HGB 11.9* 13.0 12.7* 13.0  HCT 36.9* 40.2 38.1* 40.7  MCV 92.3 91.6 90.1 92.5  PLT 194 197 214 [id number]   Basic Metabolic Panel: Recent Labs  Lab  05/24/20 1026 05/25/20 0459 05/26/20 0548 05/27/20 0528 05/28/20 0724  NA 141 141 146* 150* 153*  K 4.6 3.8 4.1 3.7 3.7  CL 105 107 107 114* 116*  CO2 22 22 27 24 27   GLUCOSE 110* 138* 113* 116* 128*  BUN 29* 35* 41* 44* 46*  CREATININE 1.59* 1.63* 1.59* 1.40* 1.54*  CALCIUM 9.1 8.9 9.4 9.1 9.4   GFR: Estimated Creatinine Clearance: 30.7 mL/min (A) (by C-G formula based on SCr of 1.54 mg/dL (H)). Liver Function Tests: Recent Labs  Lab 05/22/20 1805  AST 27  ALT 21  ALKPHOS 82  BILITOT 0.8  PROT 7.3  ALBUMIN 4.0   No results for input(s): LIPASE, AMYLASE in the last 168 hours. No results for input(s): AMMONIA in the last 168 hours. Coagulation Profile: Recent Labs  Lab 05/22/20 1805  INR 0.9   Cardiac Enzymes: No results for input(s): CKTOTAL, CKMB, CKMBINDEX, TROPONINI in the last 168 hours. BNP (last 3 results) No results for input(s): PROBNP  in the last 8760 hours. HbA1C: No results for input(s): HGBA1C in the last 72 hours. CBG: No results for input(s): GLUCAP in the last 168 hours. Lipid Profile: No results for input(s): CHOL, HDL, LDLCALC, TRIG, CHOLHDL, LDLDIRECT in the last 72 hours. Thyroid Function Tests: No results for input(s): TSH, T4TOTAL, FREET4, T3FREE, THYROIDAB in the last 72 hours. Anemia Panel: No results for input(s): VITAMINB12, FOLATE, FERRITIN, TIBC, IRON, RETICCTPCT in the last 72 hours. Sepsis Labs: Recent Labs  Lab 05/22/20 1805 05/23/20 0423 05/24/20 1026  PROCALCITON  --  0.20 0.16  LATICACIDVEN 1.1  --   --     Recent Results (from the past 240 hour(s))  Urine Culture     Status: None   Collection Time: 05/22/20 11:59 PM   Specimen: Urine, Random  Result Value Ref Range Status   Specimen Description   Final    URINE, RANDOM Performed at [facility], [address] [place], [place] [address]    Special Requests   Final    Normal Performed at [facility], [address], [place], [place]  [address]    Culture   Final    NO GROWTH Performed at [facility], [place] [address], [place], [place] [address]    Report Status 05/24/2020 FINAL  Final         Radiology Studies: No results found.      Scheduled Meds:  vitamin C  500 mg Oral Daily   aspirin EC  81 mg Oral Daily   carbidopa-levodopa  0.5 tablet Oral TID WC   cholecalciferol  5,000 Units Oral Daily   donepezil  10 mg Oral QHS   enoxaparin (LOVENOX) injection  40 mg Subcutaneous Q24H   famotidine  20 mg Oral Daily   levothyroxine  50 mcg Oral Q0600   melatonin  10 mg Oral QHS   mirtazapine  45 mg Oral QHS   pantoprazole (PROTONIX) IV  40 mg Intravenous Q24H   vitamin B-12  5,000 mcg Oral Daily   zinc sulfate  220 mg Oral Daily   Continuous Infusions:    LOS: 5 days    Time spent: 15 minutes    Sidney Ace, MD [facility] Pager [phone]  If 7PM-7AM, please contact night-coverage 05/28/2020, 11:51 AM

## 2020-05-28 NOTE — Progress Notes (Signed)
Occupational Therapy Treatment Patient Details Name: Blake Bates MRN: [id number] DOB: [birth year] Today's Date: 05/28/2020    History of present illness "Blake Bates" Bates is a 40 yoM who comes to [facility] on 05/22/20 c inability to walk, acute on chronic AMS. Patient tested (+) COVID19. PMH: Lewy Bodies, PD, baseline AMB difficulty with RW, HLD, hypoTSH, osteoporosis. Pt febrile in ED. CXR revealing of new mid thoracic compression fracture 40% height loss, chronicity unknown.   OT comments  Pt seen for skilled co-treatment with PT. Pt's wife present during session to observe. Pt required max of 2 for bed mobility to EOB. Pt very anxious. He is unable to follow 1 step commands consistently during session and needing max multimodal cuing and physical assistance for safety. Pt displays significant R lateral lean with focus on midline orientation by leaning onto L elbow and returning to midline with cuing and min A. Attempted lateral reach across midline and anterior weight shift to obtain items with pt needing mod - max A for sitting balance and max cuing. Multiple attempts to stand from EOB but pt lifting R LE off floor and unable to follow commands. Pt reaching out and grabbing towards therapist as he feels he is falling. Caregiver present to see level of assist pt needs and agrees he will need short term rehab at discharge. Pt needing max A of 2 to return to bed and reposition for safety. Pt very fatigued and asleep as therapist exits the room. All needs within reach and bed lowered to floor for safety.   Follow Up Recommendations  SNF;Supervision/Assistance - 24 hour    Equipment Recommendations  Other (comment) (defer to next venue of care)       Precautions / Restrictions Precautions Precautions: Fall Precaution Comments: New incidental finding of mid thoracic compression fracture, age indeterminate Restrictions Weight Bearing Restrictions: No       Mobility Bed Mobility Overal bed  mobility: Needs Assistance Bed Mobility: Supine to Sit;Sit to Supine     Supine to sit: Max assist;+2 for physical assistance Sit to supine: Max assist;+2 for physical assistance   General bed mobility comments: +2 person assistance required for bed mobility efforts. assistance for trunk and BLE support. verbal, tactile, and hand over hand cues required to complete all tasks  Transfers                 General transfer comment: attempted multiple time to perform sit to stand transfer from bed, with walker and without walker with +2 person assistance. patient has difficulty with weight shifting forward, difficulty with foot placement (often lifting up right foot from ground), and appears to have increased in anxiety and tremor activity with standing attempts. no weight acceptance throughout BLE with standing attempts. patient is currently unable to stand    Balance Overall balance assessment: Needs assistance Sitting-balance support: Single extremity supported;Feet supported Sitting balance-Leahy Scale: Zero Sitting balance - Comments: patient required Mod A- Max A with brief periods of Min A to maintain midline sitting balance. patient has left inattention in sitting and leans/pushes to the right side using LUE. faciliation provided for midline with visual cues for increased postural awareness and midline positioning                                   ADL either performed or assessed with clinical judgement        Vision Patient Visual Report: No change  from baseline     Perception     Praxis      Cognition Arousal/Alertness: Awake/alert Behavior During Therapy: Anxious Overall Cognitive Status: History of cognitive impairments - at baseline                                 General Comments: patient needs extra time, repetition, and hand over hand to follow single step commands                   Pertinent Vitals/ Pain       Pain  Assessment: Faces Faces Pain Scale: No hurt         Frequency  Min 1X/week        Progress Toward Goals  OT Goals(current goals can now be found in the care plan section)  Progress towards OT goals: Progressing toward goals  Acute Rehab OT Goals Patient Stated Goal: to go home OT Goal Formulation: With family Time For Goal Achievement: 06/06/20 Potential to Achieve Goals: [place] Discharge plan remains appropriate    Co-evaluation    PT/OT/SLP Co-Evaluation/Treatment: Yes Reason for Co-Treatment: Complexity of the patient's impairments (multi-system involvement);Necessary to address cognition/behavior during functional activity;For patient/therapist safety PT goals addressed during session: Mobility/safety with mobility OT goals addressed during session: ADL's and self-care      AM-PAC OT "6 Clicks" Daily Activity     Outcome Measure   Help from another person eating meals?: A Lot Help from another person taking care of personal grooming?: A Lot Help from another person toileting, which includes using toliet, bedpan, or urinal?: Total Help from another person bathing (including washing, rinsing, drying)?: Total Help from another person to put on and taking off regular upper body clothing?: A Lot Help from another person to put on and taking off regular lower body clothing?: Total 6 Click Score: 9    End of Session Equipment Utilized During Treatment: Rolling walker  OT Visit Diagnosis: Unsteadiness on feet (R26.81);Muscle weakness (generalized) (M62.81)   Activity Tolerance Patient limited by fatigue   Patient Left in bed;with call bell/phone within reach;with family/visitor present           Time: 3329-5188 OT Time Calculation (min): 55 min  Charges: OT General Charges $OT Visit: 1 Visit OT Treatments $Therapeutic Activity: 23-37 mins  Darleen Crocker, MS, OTR/L , CBIS ascom [phone]  05/28/20, 4:14 PM

## 2020-05-28 NOTE — Progress Notes (Signed)
Physical Therapy Treatment Patient Details Name: Blake Bates MRN: [id number] DOB: [birth year] Today's Date: 05/28/2020    History of Present Illness "Blake Bates" Thielke is a 74yoM who comes to [facility] on 05/22/20 c inability to walk, acute on chronic AMS. Patient tested (+) COVID19. PMH: Lewy Bodies, PD, baseline AMB difficulty with RW, HLD, hypoTSH, osteoporosis. Pt febrile in ED. CXR revealing of new mid thoracic compression fracture 40% height loss, chronicity unknown.    PT Comments    Patient continues to require significant assistance for mobility efforts. Patient needs extra time, repetition, and hand over hand cues to follow single step commands. Patient needs Max A +2 person for bed mobility. Facilitation provided for midline sitting balance, forward weight shifting, with cues for increased postural awareness and to maintain midline sitting balance. Patient has left inattention in sitting position and needs frequent cues for attention to left side. Patient also tends to push with lean to the right in sitting position. Despite multiple attempts, patient was unable to stand with +2 person assistance. Patient has difficulty with anterior weight shifting and no weight acceptance through BLE with standing efforts (lifting right foot off of floor frequently with standing efforts). Spouse present to see how much assistance patient is currently requiring, and states she will be unable to care for patient at home even with a caregiver. Spouse seems in agreement that patient needs rehab prior to coming home. Highly recommend SNF placement at discharge. Recommend to continue PT to maximize independence and address functional limitations remaining.    Follow Up Recommendations  SNF     Equipment Recommendations   (to be determined at next level of care)    Recommendations for Other Services       Precautions / Restrictions Precautions Precautions: Fall Restrictions Weight Bearing  Restrictions: No    Mobility  Bed Mobility Overal bed mobility: Needs Assistance Bed Mobility: Supine to Sit;Sit to Supine     Supine to sit: Max assist;+2 for physical assistance Sit to supine: Max assist;+2 for physical assistance   General bed mobility comments: +2 person assistance required for bed mobility efforts. assistance for trunk and BLE support. verbal, tactile, and hand over hand cues required to complete all tasks  Transfers                 General transfer comment: attempted multiple time to perform sit to stand transfer from bed, with walker and without walker with +2 person assistance. patient has difficulty with weight shifting forward, difficulty with foot placement (often lifting up right foot from ground), and appears to have increased in anxiety and tremor activity with standing attempts. no weight acceptance throughout BLE with standing attempts. patient is currently unable to stand  Ambulation/Gait                 Stairs             Wheelchair Mobility    Modified Rankin (Stroke Patients Only)       Balance Overall balance assessment: Needs assistance Sitting-balance support: Single extremity supported;Feet supported Sitting balance-Leahy Scale: Zero Sitting balance - Comments: patient required Mod A- Max A with brief periods of Min A to maintain midline sitting balance. patient has left inattention in sitting and leans/pushes to the right side using LUE. faciliation provided for midline with visual cues for increased postural awareness and midline positioning  Cognition Arousal/Alertness: Awake/alert Behavior During Therapy: Anxious Overall Cognitive Status: History of cognitive impairments - at baseline                                 General Comments: patient needs extra time, repetition, and hand over hand to follow single step commands      Exercises       General Comments        Pertinent Vitals/Pain Pain Assessment:  (no pain reported prior to mobilizing. patient does moan at times with mobility efforts, however unable to state where he might be having pain)    Home Living                      Prior Function            PT Goals (current goals can now be found in the care plan section) Acute Rehab PT Goals Patient Stated Goal: to go home PT Goal Formulation: With patient Time For Goal Achievement: 06/07/20 Potential to Achieve Goals: Fair Progress towards PT goals: Progressing toward goals    Frequency    Min 2X/week      PT Plan Current plan remains appropriate    Co-evaluation PT/OT/SLP Co-Evaluation/Treatment: Yes Reason for Co-Treatment: Complexity of the patient's impairments (multi-system involvement) PT goals addressed during session: Mobility/safety with mobility        AM-PAC PT "6 Clicks" Mobility   Outcome Measure  Help needed turning from your back to your side while in a flat bed without using bedrails?: Total Help needed moving from lying on your back to sitting on the side of a flat bed without using bedrails?: Total Help needed moving to and from a bed to a chair (including a wheelchair)?: Total Help needed standing up from a chair using your arms (e.g., wheelchair or bedside chair)?: Total Help needed to walk in hospital room?: Total Help needed climbing 3-5 steps with a railing? : Total 6 Click Score: 6    End of Session   Activity Tolerance: Patient limited by fatigue;Treatment limited secondary to agitation Patient left: in bed;with call bell/phone within reach;with bed alarm set;with family/visitor present Nurse Communication: Mobility status PT Visit Diagnosis: Unsteadiness on feet (R26.81);Other abnormalities of gait and mobility (R26.89);Repeated falls (R29.6);Muscle weakness (generalized) (M62.81);Other symptoms and signs involving the nervous system (R29.898)     Time:  4081-4481 PT Time Calculation (min) (ACUTE ONLY): 57 min  Charges:  $Therapeutic Activity: 8-22 mins $Neuromuscular Re-education: 8-22 mins                     Minna Merritts, PT, MPT    Percell Locus 05/28/2020, 3:52 PM

## 2020-05-29 DIAGNOSIS — R410 Disorientation, unspecified: Secondary | ICD-10-CM

## 2020-05-29 DIAGNOSIS — U071 COVID-19: Secondary | ICD-10-CM | POA: Diagnosis not present

## 2020-05-29 LAB — CBC
HCT: 42.7 % (ref 39.0–52.0)
Hemoglobin: 13.7 g/dL (ref 13.0–17.0)
MCH: 29.5 pg (ref 26.0–34.0)
MCHC: 32.1 g/dL (ref 30.0–36.0)
MCV: 91.8 fL (ref 80.0–100.0)
Platelets: 298 10*3/uL (ref 150–400)
RBC: 4.65 MIL/uL (ref 4.22–5.81)
RDW: 14.1 % (ref 11.5–15.5)
WBC: 9.5 10*3/uL (ref 4.0–10.5)
nRBC: 0 % (ref 0.0–0.2)

## 2020-05-29 LAB — C-REACTIVE PROTEIN: CRP: 13.9 mg/dL — ABNORMAL HIGH (ref <1.0)

## 2020-05-29 LAB — VITAMIN B12: Vitamin B-12: 7357 pg/mL — ABNORMAL HIGH (ref 180–914)

## 2020-05-29 LAB — AMMONIA: Ammonia: 11 umol/L (ref 9–35)

## 2020-05-29 MED ORDER — ADULT MULTIVITAMIN W/MINERALS CH
1.0000 | ORAL_TABLET | Freq: Every day | ORAL | Status: DC
  Administered 2020-05-30 – 2020-06-02 (×4): 1 via ORAL
  Filled 2020-05-29 (×5): qty 1

## 2020-05-29 MED ORDER — THIAMINE HCL 100 MG/ML IJ SOLN: 100.0000 mg | Freq: Every day | INTRAMUSCULAR | Status: DC

## 2020-05-29 MED ORDER — THIAMINE HCL 100 MG PO TABS
100.0000 mg | ORAL_TABLET | Freq: Every day | ORAL | Status: DC
  Filled 2020-05-29: qty 1

## 2020-05-29 MED ORDER — PANTOPRAZOLE SODIUM 40 MG PO TBEC
40.0000 mg | DELAYED_RELEASE_TABLET | Freq: Every day | ORAL | Status: DC
  Administered 2020-05-29 – 2020-06-02 (×5): 40 mg via ORAL
  Filled 2020-05-29 (×6): qty 1

## 2020-05-29 NOTE — TOC Progression Note (Signed)
Transition of Care [facility]) - Progression Note    Patient Details  Name: KARTHIK WHITTINGHILL MRN: [id number] Date of Birth: Apr 25, 1941  Transition of Care [facility] CM/SW Contact  Shelbie Hutching, RN Phone Number: 05/29/2020, 2:54 PM  Clinical Narrative:    Patient's wife verbalized that she would like for patient to go to [facility].  RNCM reached out to [facility] but they are unable to offer a bed.  TOC will cont to look for placement.  [facility] but they cannot accept him until 1/25.    Expected Discharge Plan: [place] Barriers to Discharge: Continued Medical Work up  Expected Discharge Plan and Services Expected Discharge Plan: [place]   Discharge Planning Services: CM Consult Post Acute Care Choice: [place] Living arrangements for the past 2 months: Single Family Home                                       Social Determinants of Health (SDOH) Interventions    Readmission Risk Interventions No flowsheet data found.

## 2020-05-29 NOTE — Consult Note (Signed)
NEUROLOGY CONSULTATION NOTE   Date of service: May 29, 2020 Patient Name: Blake Bates MRN:  [id number] DOB:  05-Jul-1940 Reason for consult: "Parkinsons management" _ _ _   _ __   _ __ _ _  __ __   _ __   __ _  History of Present Illness  Blake Bates is a 80 y.o. 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 He was found to be COVID positive with no respiratory symptoms. He is usually able to ambulate with a walker at his baseline.  On chart review, patient follows with Dr. Delice Lesch and was started on low dose Sinemet 25/100mg , half a tablet 3 times a day along with continuing Mirtazapine 30mg  qhs and Donepezil. He was not taking Sinemet at home. Sinemet was resumed with some improvement in his symptoms.  However, he is not getting his Sinemet reliably and over the last 5 days, he has only gotten Sinemet about twice a day.  I spoke to patient's wife who is present at the bedside.  She reports the patient seemed absolutely fine on the morning of day of admission.  He reported that he was tired and just wanted to sleep at around 11 AM in the morning on that day.  When he went to bed, he was weak all over and just unable to sit at the edge of the bed or get up and walk.  She brought into the hospital for further evaluation.  She has been coming in every single day to take care of him.  She reports that he has been more awake over the last 2 days and today was able to sit at the edge of the bed with help from PT and OT and has shown consistent gains over the last couple days.  He still has waxing and waning mentation.   ROS  Unable to obtain the detailed review of system due to somnolence.  Patient denies any pain. Past History   Past Medical History:  Diagnosis Date  . Asthma   . Diverticulosis   . Full dentures   . Hyperlipidemia   . Hypothyroidism    s/p subtotal thyroidectomy  . Muscle  spasms of lower extremity    Right upper leg  . Osteoporosis   . Rhinitis, allergic    pollens, mold, animal dander   Past Surgical History:  Procedure Laterality Date  . CATARACT EXTRACTION W/PHACO Left 09/20/2014   Procedure: CATARACT EXTRACTION PHACO AND INTRAOCULAR LENS PLACEMENT (IOC);  Surgeon: Leandrew Koyanagi, MD;  Location: [facility];  Service: Ophthalmology;  Laterality: Left;  . COLONOSCOPY    . PARATHYROIDECTOMY    . THYROID SURGERY     subtotal   Family History  Problem Relation Age of Onset  . Stroke Mother 28  . Lung cancer Father 74  . Prostate cancer Paternal Grandfather 72  . Stroke Paternal Aunt    Social History   Socioeconomic History  . Marital status: Married    Spouse name: Not on file  . Number of children: Not on file  . Years of education: Not on file  . Highest education level: Doctorate  Occupational History  . Occupation: retired  Tobacco Use  . Smoking status: Former Smoker    Packs/day: 0.00    Years: 60.00    Pack years: 0.00    Types: Pipe    Quit date: 05/12/2016    Years since  quitting: 4.0  . Smokeless tobacco: Former Systems developer  . Tobacco comment: Self taper down from full pipe daily down to half to quarter pipe  Vaping Use  . Vaping Use: Never used  Substance and Sexual Activity  . Alcohol use: Not Currently    Comment: occasionally beer or wine   . Drug use: No  . Sexual activity: Not on file  Other Topics Concern  . Not on file  Gratis lives in 1 story home with his wife   Right handed   Has 2 surviving children   PhD in Biology   Retired professor   Social Determinants of Radio broadcast assistant Strain: [place]   . Difficulty of Paying Living Expenses: Not hard at all  Food Insecurity: No Food Insecurity  . Worried About Charity fundraiser in the Last Year: Never true  . Ran Out of Food in the Last Year: Never true  Transportation Needs: No  Transportation Needs  . Lack of Transportation (Medical): No  . Lack of Transportation (Non-Medical): No  Physical Activity: Inactive  . Days of Exercise per Week: 0 days  . Minutes of Exercise per Session: 0 min  Stress: No Stress Concern Present  . Feeling of Stress : Not at all  Social Connections: Moderately Integrated  . Frequency of Communication with Friends and Family: More than three times a week  . Frequency of Social Gatherings with Friends and Family: More than three times a week  . Attends Religious Services: More than 4 times per year  . Active Member of Clubs or Organizations: No  . Attends Archivist Meetings: Never  . Marital Status: Married   Allergies  Allergen Reactions  . Mold Extract  [Trichophyton]   . Morphine Anxiety and Other (See Comments)    Medications   Medications Prior to Admission  Medication Sig Dispense Refill Last Dose  . acetaminophen (TYLENOL) 650 MG CR tablet Take 650 mg by mouth daily.     [name] albuterol (PROVENTIL HFA;VENTOLIN HFA) 108 (90 BASE) MCG/ACT inhaler Inhale 2 puffs into the lungs every 6 (six) hours as needed for wheezing or shortness of breath.   Unknown at PRN  . Cholecalciferol (VITAMIN D-3 PO) Take 5,000 Units by mouth at bedtime.     . Cyanocobalamin (VITAMIN B-12) 5000 MCG SUBL Place 5,000 mcg under the tongue daily.     [name] donepezil (ARICEPT) 10 MG tablet Take 1 tablet daily (Patient taking differently: Take 10 mg by mouth daily at 12 noon.) 90 tablet 3 24+ hours at Unknown  . levothyroxine (SYNTHROID) 50 MCG tablet TAKE 1 TABLET(50 MCG) BY MOUTH DAILY BEFORE BREAKFAST (Patient taking differently: Take 50 mcg by mouth daily before breakfast.) 90 tablet 3 24+ hours at Unknown  . loratadine (CLARITIN) 10 MG tablet Take 10 mg by mouth daily as needed for allergies.   Unknown at PRN  . Melatonin 10 MG TBCR Take 10 mg by mouth at bedtime.     . meloxicam (MOBIC) 15 MG tablet TAKE 1 TABLET BY MOUTH DAILY AS NEEDED FOR PAIN  AND ARTHRITIS FLARE, TAKE UP TO 1 TO 2 WEEKS AT A TIME (Patient taking differently: Take 15 mg by mouth daily as needed for pain (arthritis flare). TAKE 1 TABLET BY MOUTH DAILY AS NEEDED FOR PAIN AND ARTHRITIS FLARE, TAKE UP TO 1 TO 2 WEEKS AT A TIME) 90 tablet 1 Unknown  at PRN  . mirtazapine (REMERON) 45 MG tablet Take 1 tablet (45 mg total) by mouth at bedtime. 30 tablet 11 24+ hours at Unknown  . carbidopa-levodopa (SINEMET IR) 25-100 MG tablet Take 1/2 tablet three times a day with meals (Patient taking differently: Take 0.5 tablets by mouth 3 (three) times daily with meals.) 45 tablet 6      Vitals   Vitals:   05/28/20 1709 05/28/20 2018 05/29/20 0513 05/29/20 1205  BP: (!) 159/81 136/85 139/78 130/77  Pulse: 77 80 75 100  Resp: 20 18 18 16   Temp: 97.8 F (36.6 C) 98.1 F (36.7 C) 97.8 F (36.6 C) 97.8 F (36.6 C)  TempSrc: Oral Oral    SpO2: 94% 100% 100% 100%  Weight:      Height:         Body mass index is 17.43 kg/m.  Physical Exam   General: Laying comfortably in bed; in no acute distress.  HENT: Normal oropharynx and mucosa. Normal external appearance of ears and nose.  Neck: Supple, no pain or tenderness  CV: No JVD. No peripheral edema.  Pulmonary: Symmetric Chest rise. Normal respiratory effort.  Abdomen: Soft to touch, non-tender.  Ext: No cyanosis, edema, or deformity  Skin: No rash. Normal palpation of skin.   Musculoskeletal: Normal digits and nails by inspection. No clubbing.   Neurologic Examination  Mental status/Cognition: Awake, but waxing and waning mentation. He keeps going to sleep and then getting up during my conversation with his wife at bedside. Oriented to self only. Poor attention. Speech/language: Fluent, comprehension intact to simple commands, unable to name my thumb. Cranial nerves:   CN II Pupils equal and reactive to light, no VF deficits   CN III,IV,VI Looks to left and to right, blinks to threat.   CN V    CN VII no asymmetry, no  nasolabial fold flattening   CN VIII normal hearing to speech   CN IX & X    CN XI    CN XII midline tongue protrusion   Motor:  Muscle bulk: poor, tone cogwheel rigidity, tremor yes, both resting and action tremor. Not classic pill rolling tremor however and seems much faster frequency.  Unable to do a detailed evaluation of his motor strength due to encephalopathy.  He has 5 out of 5 grip strength in both upper extremities.  5 out of 5 strength with push and pull.  He is able to lift both lower extremities off the bed but is unable to reliably hold them off the bed for more than a couple seconds.  Reflexes:  Right Left Comments  Pectoralis      Biceps (C5/6) 2 2   Brachioradialis (C5/6) 2 2    Triceps (C6/7) 2 2    Patellar (L3/4) 2 2    Achilles (S1)      Hoffman      Plantar     Jaw jerk    Sensation:  Light touch Intact throughout   Pin prick    Temperature    Vibration   Proprioception    Coordination/Complex Motor:  - Finger to Nose tremors BL, unable to reliably test coordination.   Labs   CBC:  Recent Labs  Lab 05/25/20 0459 05/26/20 0548 05/29/20 0432  WBC 6.4 6.2 9.5  NEUTROABS 5.0 4.7  --   HGB 12.7* 13.0 13.7  HCT 38.1* 40.7 42.7  MCV 90.1 92.5 91.8  PLT 214 221 [id number]    Basic Metabolic Panel:  Lab Results  Component Value Date   NA 153 (H) 05/28/2020   K 3.7 05/28/2020   CO2 27 05/28/2020   GLUCOSE 128 (H) 05/28/2020   BUN 46 (H) 05/28/2020   CREATININE 1.54 (H) 05/28/2020   CALCIUM 9.4 05/28/2020   GFRNONAA 45 (L) 05/28/2020   GFRAA 40 (L) 01/24/2020   Lipid Panel:  Lab Results  Component Value Date   LDLCALC 92 12/20/2019   HgbA1c:  Lab Results  Component Value Date   HGBA1C 4.8 12/20/2019   Urine Drug Screen: No results found for: LABOPIA, COCAINSCRNUR, LABBENZ, AMPHETMU, THCU, LABBARB  Alcohol Level No results found for: [facility]  CT Head without contrast: 1. No CT evidence for acute intracranial abnormality. 2. Atrophy and  mild chronic small vessel ischemic change of the white matter. 3. Stable degree of ventricular enlargement. Grossly stable bilateral parietal arachnoid cysts.  Impression   Blake Bates is a 80 y.o. male with  PMH significant for HLD, Hypothyroidism, dementia with Lewy body, who is admitted with encephalopathy, lethargy and generalized weakness. He was found to be COVID positive with no respiratory symptoms. Neuro exam today is consistent with delirium along with parkinsonian features including tremors, rigidity and bradykinesia. Wife reports that delirium has improved over the last 2 days, now he seems to be more awake and does have periods where he is more withit and periods where he is more somnolent.   Of note, he was not on Sinemet at home despite being prescribed. Sinemet was started at low dose, however he has not been reliably and consistently getting his Sinemet. Just over the last 5 days, he has only gotten it either once of twice a day instead of the ordered TID dosing.  Recommendations  - Recommend ensuring that he is getting Sinemet 0.5 tablet TID here - I ordered Delirium precautions and Up in chair ordered and advance activity as tolerated starting tomorrow. - Recommend that if primary team is okay, to minimize vital checks overnight(not ordered) - I orrdered Encephalopathy labs with Vit B12, folate, TSH, Vit B1. - I ordered Multivitamin tablet along with Thiamine 100mg  daily IV or PO. ______________________________________________________________________   Thank you for the opportunity to take part in the care of this patient. If you have any further questions, please contact the neurology consultation attending.  Signed,  [place] Pager Number [phone] _ _ _   _ __   _ __ _ _  __ __   _ __   __ _

## 2020-05-29 NOTE — Progress Notes (Signed)
PROGRESS NOTE    Blake Bates  DZH:[id number] DOB: 1941-04-07 DOA: 05/22/2020 PCP: [name], DO   Brief Narrative:   80 year old Caucasian male history of Parkinson's and Lewy body dementia who presented to the emergency room with acute onset of altered mentation and weakness.  Per the patient's wife his change occurred very acutely.  At baseline he is able to ambulate with a walker.  Today he was so weak he could barely get up out of bed.  The patient has been vaccinated against [id number] and had a booster shot on 02/07/2020.  On admission he was found to be COVID-positive.  No pneumonia noted on chest x-ray.  No oxygen requirement.  Patient did develop nonsustained narrow complex tachycardia, suspect SVT versus atrial fibrillation.  Unfortunately we could not capture this on twelve-lead EKG.  Since this one episode, he has not had recurrence of his cardiac symptoms.  After several discussions with wife we have found that the patient was prescribed Sinemet approximately 6 months ago however has barely started it due to GI upset as result of this medication.  I explained the importance of Sinemet adherence at length with wife at bedside.  She expressed understanding.  His tremors have improved however his neurologic function remains quite poor.  He is still not able to ambulate and requires max assist.  Therapy still recommending skilled nursing facility placement.  Wife is aware and in agreement.  Given his lack of progress from neurologic status I have requested consultation with neurology.   Assessment & Plan:   Active Problems:   COVID-19  COVID-19 infection Acute metabolic encephalopathy secondary to above Sepsis ruled out Patient is vaccinated and booster.   He has no pneumonia on chest x-ray however has had intermittent fevers.   Last fever noted on 05/23/20 at 2019.   No fever since. Encephalopathy appears to be improving Completed 5 days of  remdesivir Plan: Hold steroids at this time given lack of hypoxia and pneumonia Monitor inflammatory markers Oxygen if needed Every 4 hours neurochecks Frequent reorienting measures  Parkinson's Lewy body dementia Patient has had acute deterioration of his neurologic status, decreased level of mobility, increased tremors since the day of admission.  This is likely an exacerbation of his underlying Parkinson's/Lewy body dementia in the setting of acute COVID infection.  His neurologic status seems to be poor but slowly improving.    1/15: I did discussion with the wife this morning.  Apparently the patient was not taking his home Sinemet because of some GI upset.  She states the patient was previously put on Sinemet approximately 6 months ago but his adherence to this medication has been poor because of his poor p.o. tolerance.  I had a long discussion with her and explained that is very important that he takes his Sinemet and this is likely the most important medication that he has.  She expressed understanding. -Parkinson symptoms appear to be improving -Ability to ambulate remains quite poor Plan: Continue Sinemet 0.5 tabs 3 times daily.  Ensure the patient misses 0 doses Continue Aricept per home dose Up as tolerated with nursing and therapy Overall his neurologic status is improving however his ability to ambulate remains quite poor.  As such I have requested official consultation with neurology.  Recommendations appreciated.  Hypothyroidism Continue Synthroid  Stage IIIb chronic kidney disease No IV fluids at this time Creatinine at or near baseline Will follow Chem-7  DVT prophylaxis: Lovenox Code Status: Full Family Communication: Wife Blake Bates  [phone] bedside on 05/29/2020 Disposition Plan: Status is: Inpatient  Remains inpatient appropriate because:Inpatient level of care appropriate due to severity of illness   Dispo: The patient is from: Home              Anticipated  d/c is to: SNF              Anticipated d/c date is: 1 day              Patient currently is not medically stable to d/c.   Mental status slowly improving.  Movement disorder still quite severe and patient not at baseline.  Neurology consultation requested.  Patient will benefit from skilled nursing facility at time of discharge.  If no further recommendations or inpatient evaluation from neurology patient may be stable for discharge to skilled nursing facility on 05/30/2020.   Consultants:   None  Procedures:   None  Antimicrobials:   Remdesivir   Subjective: Patient seen and examined.  Answers questions appropriately.  Getting fed by his wife.  Sitting up in bed.  Improved tremulousness.  Objective: Vitals:   05/28/20 1709 05/28/20 2018 05/29/20 0513 05/29/20 1205  BP: (!) 159/81 136/85 139/78 130/77  Pulse: 77 80 75 100  Resp: 20 18 18 16   Temp: 97.8 F (36.6 C) 98.1 F (36.7 C) 97.8 F (36.6 C) 97.8 F (36.6 C)  TempSrc: Oral Oral    SpO2: 94% 100% 100% 100%  Weight:      Height:       No intake or output data in the 24 hours ending 05/29/20 1249 Filed Weights   05/22/20 1802  Weight: 56.7 kg    Examination:  General exam: Mildly tremulous.  Sitting up in bed.  No apparent distress Respiratory system: Clear to auscultation.  Normal work of breathing.  Room air Cardiovascular system: Tachycardic, regular rhythm, no murmurs Gastrointestinal system: Abdomen is nondistended, soft and nontender. No organomegaly or masses felt. Normal bowel sounds heard. Central nervous system: Alert, oriented x2, tremulous but no focal deficits. Extremities: Symmetrically decreased power upper and lower extremities Skin: No rashes, lesions or ulcers Psychiatry: Judgement and insight appear impaired. Mood & affect flattened.     Data Reviewed: I have personally reviewed following labs and imaging studies  CBC: Recent Labs  Lab 05/22/20 1805 05/24/20 1026 05/25/20 0459  05/26/20 0548 05/29/20 0432  WBC 7.3 6.9 6.4 6.2 9.5  NEUTROABS 6.0 5.5 5.0 4.7  --   HGB 11.9* 13.0 12.7* 13.0 13.7  HCT 36.9* 40.2 38.1* 40.7 42.7  MCV 92.3 91.6 90.1 92.5 91.8  PLT 194 197 214 221 [id number]   Basic Metabolic Panel: Recent Labs  Lab 05/24/20 1026 05/25/20 0459 05/26/20 0548 05/27/20 0528 05/28/20 0724  NA 141 141 146* 150* 153*  K 4.6 3.8 4.1 3.7 3.7  CL 105 107 107 114* 116*  CO2 22 22 27 24 27   GLUCOSE 110* 138* 113* 116* 128*  BUN 29* 35* 41* 44* 46*  CREATININE 1.59* 1.63* 1.59* 1.40* 1.54*  CALCIUM 9.1 8.9 9.4 9.1 9.4   GFR: Estimated Creatinine Clearance: 30.7 mL/min (A) (by C-G formula based on SCr of 1.54 mg/dL (H)). Liver Function Tests: Recent Labs  Lab 05/22/20 1805  AST 27  ALT 21  ALKPHOS 82  BILITOT 0.8  PROT 7.3  ALBUMIN 4.0   No results for input(s): LIPASE, AMYLASE in the last 168 hours. No results for input(s): AMMONIA in the last 168 hours. Coagulation Profile: Recent Labs  Lab  05/22/20 1805  INR 0.9   Cardiac Enzymes: No results for input(s): CKTOTAL, CKMB, CKMBINDEX, TROPONINI in the last 168 hours. BNP (last 3 results) No results for input(s): PROBNP in the last 8760 hours. HbA1C: No results for input(s): HGBA1C in the last 72 hours. CBG: No results for input(s): GLUCAP in the last 168 hours. Lipid Profile: No results for input(s): CHOL, HDL, LDLCALC, TRIG, CHOLHDL, LDLDIRECT in the last 72 hours. Thyroid Function Tests: No results for input(s): TSH, T4TOTAL, FREET4, T3FREE, THYROIDAB in the last 72 hours. Anemia Panel: No results for input(s): VITAMINB12, FOLATE, FERRITIN, TIBC, IRON, RETICCTPCT in the last 72 hours. Sepsis Labs: Recent Labs  Lab 05/22/20 1805 05/23/20 0423 05/24/20 1026  PROCALCITON  --  0.20 0.16  LATICACIDVEN 1.1  --   --     Recent Results (from the past 240 hour(s))  Urine Culture     Status: None   Collection Time: 05/22/20 11:59 PM   Specimen: Urine, Random  Result Value Ref Range  Status   Specimen Description   Final    URINE, RANDOM Performed at [facility], [address] [place], [place] [address]    Special Requests   Final    Normal Performed at [facility], [address], [place], [place] [address]    Culture   Final    NO GROWTH Performed at [facility], [place] [address], [place], [place] [address]    Report Status 05/24/2020 FINAL  Final         Radiology Studies: No results found.      Scheduled Meds: . vitamin C  500 mg Oral Daily  . aspirin EC  81 mg Oral Daily  . carbidopa-levodopa  0.5 tablet Oral TID WC  . cholecalciferol  5,000 Units Oral Daily  . donepezil  10 mg Oral QHS  . enoxaparin (LOVENOX) injection  40 mg Subcutaneous Q24H  . famotidine  20 mg Oral Daily  . levothyroxine  50 mcg Oral Q0600  . melatonin  10 mg Oral QHS  . mirtazapine  45 mg Oral QHS  . pantoprazole (PROTONIX) IV  40 mg Intravenous Q24H  . vitamin B-12  5,000 mcg Oral Daily  . zinc sulfate  220 mg Oral Daily   Continuous Infusions:    LOS: 6 days    Time spent: 15 minutes    Sidney Ace, MD [facility] Pager [phone]  If 7PM-7AM, please contact night-coverage 05/29/2020, 12:49 PM

## 2020-05-29 NOTE — Progress Notes (Signed)
Speech Language Pathology Treatment: Dysphagia  Patient Details Name: Blake Bates MRN: [id number] DOB: [birth year] Today's Date: 05/29/2020 Time: 0915-0950 SLP Time Calculation (min) (ACUTE ONLY): 35 min  Assessment / Plan / Recommendation Clinical Impression  Pt seen today for ongoing assessment of swallowing; toleration of diet and education w/ pt/Wife. Pt appears to exhibit less Belching, Hiccups and phlegm per report. He continues to need Max assist w/ all tasks; baseline Lewy body Dementia. Wife has stated that pt does "really well" w/ the Purees in the dysphagia diet.  Discussed w/ Wife, NSG, and MD this SLP's f/u at dinner meal last evening and working w/ pt then and the concerns of dypsphagia d/t pt's presentation last evening and his risks for aspiration w/ the upgraded diet from earlier at [facility] yesterday. Explained that pt exhibited increased confusion and required Max assistance w/ encouragement to take po's last evening - unsure if pt becomes more confused during the evening hours, fatigue from the day. Also discussed the increased overt s/s of aspiration noted w/ the po trials including the thin liquids despite the strict aspiration precautions and Pinching the straw to limit bolus size. ANY increased episodes of coughing/aspiration could lead to Pulmonary decline for pt. Strongly recommended a return to the Dysphagia level 1(puree) diet w/ Nectar liquids for safer oral intake at this time. MD agreed. Pt appears to present w/ oropharyngeal phase dysphagia in light of Significantly declined Cognitive status; Lewy-body Dementia w/ Parkinson's Dis.(has Not been taking Sinemet at home)per Wife's report. These factors impact his overall awareness/engagement and safety during po tasks which increases risk for aspiration, choking. Pt has risk for aspiration as well as concern for meeting nutritional needs fully, safely. Pt has initiated Sinemet which may be helpful. When following aspiration  precautions and given feeding assistance of modified consistencies, pt appears to tolerate bites/sips of a Dysphagia diet. He requires Mod-Max tactile/verbal/ visual cues for orientation to bolus presentation, and during feeding support. Oral phase will be more Timely w/ less textured foods(requiring Mastication). Oral prep will be easier as well.  D/t pt's declined Cognitive status, his risk for aspiration, and presentation w/ po's this session, recommend remaining on a Dysphagia level 1(puree foods) diet w/ Nectar liquids; strict aspiration precautions; reduce Distractions during meals and only give po's when pt is calm and awake to participate; Pinch Straw to limit bolus size IF using a straw. Pills Crushed in Puree as able for safer swallowing and conservation of energy. Support w/ feeding at meals -- check for oral clearing during/post intake. NSG updated. ST services recommends follow w/ Palliative Care for [place] as he has had weight loss and a decline in general status overall per chart notes. ST services will follow pt's status while admited giving education and support. Largely suspect that pt's Dementia and impact of illness could continue to hamper oral intake and upgrade of diet. Precautions posted at room. NSG and Wife updated, agreed.  Much education w/ Wife on recommendations of diet; strategies of Pinched straw to limit bolus size when drinking; aspiration precautions; positioning upright; reducing distractions during po's; Pills in puree always to reduce risk for aspiration after session. Wife agreed. NSG/MD agreed.      HPI HPI: Pt is a 80 y.o. Caucasian male with a known history of asthma, dyslipidemia, hypothyroidism, Lewy body Dementia and Parkinson's disease, who presented to the emergency room with acute onset of altered mental status with delirium and confusion that started today.  He is usually able to ambulate  with a walker at his baseline and today he was feeling so weak he could not  walk.  The patient has been vaccinated against [id number] and had his booster shot on 02/07/2020.  He has been having cough productive of clear sputum and rhinorrhea without wheezing or dyspnea.  No nausea or vomiting or diarrhea.  Pt is followed by Chronic Care Management; has had OT/PT and aide in the home for support.  He has a much reduced oral diet of honey nut cheerios w/ attempts at Ensure per report.  Weight loss.  Wife described Significant episodes of Phlegm pt expectorates in the night and morning/day.  No dx'd Reflux/GERD; not on PPI at home.   Covid+.      SLP Plan  Continue with current plan of care (education as needed)       Recommendations  Diet recommendations: Dysphagia 1 (puree);Nectar-thick liquid Liquids provided via: Cup;Straw (monitor straw use) Medication Administration: Crushed with puree (as able and needed to except for Sinemet) Supervision: Staff to assist with self feeding;Full supervision/cueing for compensatory strategies Compensations: Minimize environmental distractions;Slow rate;Small sips/bites;Lingual sweep for clearance of pocketing;Multiple dry swallows after each bite/sip;Follow solids with liquid Postural Changes and/or Swallow Maneuvers: Upright 30-60 min after meal;Out of bed for meals;Seated upright 90 degrees                General recommendations:  (Palliative Care for [place] overall) Oral Care Recommendations: Oral care BID;Oral care before and after PO;Staff/trained caregiver to provide oral care Follow up Recommendations: Skilled Nursing facility SLP Visit Diagnosis: Dysphagia, oropharyngeal phase (R13.12) (baseline LB Dementia) Plan: Continue with current plan of care (education as needed)       GO                 Orinda Kenner, [place], CCC-SLP Speech Language Pathologist Rehab Services [phone] [facility] 05/29/2020, 1:43 PM

## 2020-05-29 NOTE — Progress Notes (Addendum)
PHARMACIST - PHYSICIAN COMMUNICATION  DR:   Priscella Mann  CONCERNING: IV to Oral Route Change Policy  RECOMMENDATION: This patient is receiving pantoprazole by the intravenous route.  Based on criteria approved by the Pharmacy and Therapeutics Committee, the intravenous medication(s) is/are being converted to the equivalent oral dose form(s).   DESCRIPTION: These criteria include:  The patient is eating (either orally or via tube) and/or has been taking other orally administered medications for a least 24 hours  The patient has no evidence of active gastrointestinal bleeding or impaired GI absorption (gastrectomy, short bowel, patient on TNA or NPO).  If you have questions about this conversion, please contact the Pharmacy Department  []   [phone] )  [facility] [x]   [phone] )  [facility] []   [phone] )  [facility] []   [phone] )  [facility] []   [phone] )  [place], PharmD, BCPS Clinical Pharmacist 05/29/2020 1:29 PM

## 2020-05-30 DIAGNOSIS — R410 Disorientation, unspecified: Principal | ICD-10-CM

## 2020-05-30 DIAGNOSIS — G20 Parkinson's disease: Secondary | ICD-10-CM

## 2020-05-30 DIAGNOSIS — E039 Hypothyroidism, unspecified: Secondary | ICD-10-CM

## 2020-05-30 DIAGNOSIS — N1832 Chronic kidney disease, stage 3b: Secondary | ICD-10-CM

## 2020-05-30 DIAGNOSIS — U071 COVID-19: Secondary | ICD-10-CM | POA: Diagnosis not present

## 2020-05-30 DIAGNOSIS — F0280 Dementia in other diseases classified elsewhere without behavioral disturbance: Secondary | ICD-10-CM

## 2020-05-30 DIAGNOSIS — G3183 Dementia with Lewy bodies: Secondary | ICD-10-CM | POA: Diagnosis not present

## 2020-05-30 LAB — TSH: TSH: 2.791 u[IU]/mL (ref 0.350–4.500)

## 2020-05-30 LAB — C-REACTIVE PROTEIN: CRP: 9.9 mg/dL — ABNORMAL HIGH (ref <1.0)

## 2020-05-30 LAB — FOLATE: Folate: 16.8 ng/mL (ref >5.9)

## 2020-05-30 MED ORDER — MIRTAZAPINE 15 MG PO TABS
45.0000 mg | ORAL_TABLET | Freq: Every day | ORAL | Status: DC
  Administered 2020-05-30 – 2020-06-02 (×4): 45 mg via ORAL
  Filled 2020-05-30 (×5): qty 3

## 2020-05-30 MED ORDER — DILTIAZEM HCL 25 MG/5ML IV SOLN
10.0000 mg | INTRAVENOUS | Status: DC | PRN
  Administered 2020-05-30: 09:00:00 10 mg via INTRAVENOUS
  Filled 2020-05-30 (×2): qty 5

## 2020-05-30 MED ORDER — METOPROLOL TARTRATE 25 MG PO TABS
25.0000 mg | ORAL_TABLET | Freq: Two times a day (BID) | ORAL | Status: DC
  Administered 2020-05-30 – 2020-05-31 (×3): 25 mg via ORAL
  Filled 2020-05-30 (×2): qty 1

## 2020-05-30 MED ORDER — TRAZODONE HCL 50 MG PO TABS
50.0000 mg | ORAL_TABLET | Freq: Every day | ORAL | Status: DC
  Administered 2020-05-30 – 2020-06-02 (×4): 50 mg via ORAL
  Filled 2020-05-30 (×4): qty 1

## 2020-05-30 MED ORDER — MELATONIN 5 MG PO TABS
10.0000 mg | ORAL_TABLET | Freq: Every day | ORAL | Status: DC
  Administered 2020-05-30 – 2020-06-02 (×4): 10 mg via ORAL
  Filled 2020-05-30 (×6): qty 2

## 2020-05-30 NOTE — Progress Notes (Signed)
Patient ID: EYAN GUSTAVESON, male   DOB: [birth year], 80 y.o.   MRN: [id number] [facility] PROGRESS NOTE  Blake Bates [id number] DOB: March 17, 1941 DOA: 05/22/2020 PCP: [name], DO  HPI/Subjective: Patient admitted 05/23/2020 with confusion and found to have COVID-19 infection.  Patient was able to recognize his wife.  His bottom dentures were lost last night.  Patient does not the best historian at this time.  Has a history of Lewy body dementia.  Still having altered mental status.  Objective: Vitals:   05/30/20 1001 05/30/20 1300  BP: 129/88 (!) 127/97  Pulse: (!) 112 87  Resp:  20  Temp:  97.7 F (36.5 C)  SpO2:  (!) 88%    Intake/Output Summary (Last 24 hours) at 05/30/2020 1321 Last data filed at 05/30/2020 0525 Gross per 24 hour  Intake --  Output 400 ml  Net -400 ml   Filed Weights   05/22/20 1802  Weight: 56.7 kg    ROS: Review of Systems  Unable to perform ROS: Acuity of condition   Exam: Physical Exam HENT:     Head: Normocephalic.     Mouth/Throat:     Pharynx: No oropharyngeal exudate.  Eyes:     General: Lids are normal.     Conjunctiva/sclera: Conjunctivae normal.  Cardiovascular:     Rate and Rhythm: Normal rate and regular rhythm.     Heart sounds: Normal heart sounds, S1 normal and S2 normal.  Pulmonary:     Breath sounds: Examination of the right-lower field reveals decreased breath sounds. Examination of the left-lower field reveals decreased breath sounds. Decreased breath sounds present. No wheezing, rhonchi or rales.  Abdominal:     Palpations: Abdomen is soft.     Tenderness: There is no abdominal tenderness.  Musculoskeletal:     Right lower leg: No swelling.     Left lower leg: No swelling.  Skin:    General: Skin is warm.     Findings: No rash.  Neurological:     Mental Status: He is alert.     Comments: Moves his extremities on his own       Data Reviewed: Basic Metabolic Panel: Recent Labs   Lab 05/24/20 1026 05/25/20 0459 05/26/20 0548 05/27/20 0528 05/28/20 0724  NA 141 141 146* 150* 153*  K 4.6 3.8 4.1 3.7 3.7  CL 105 107 107 114* 116*  CO2 22 22 27 24 27   GLUCOSE 110* 138* 113* 116* 128*  BUN 29* 35* 41* 44* 46*  CREATININE 1.59* 1.63* 1.59* 1.40* 1.54*  CALCIUM 9.1 8.9 9.4 9.1 9.4    Recent Labs  Lab 05/29/20 1648  AMMONIA 11   CBC: Recent Labs  Lab 05/24/20 1026 05/25/20 0459 05/26/20 0548 05/29/20 0432  WBC 6.9 6.4 6.2 9.5  NEUTROABS 5.5 5.0 4.7  --   HGB 13.0 12.7* 13.0 13.7  HCT 40.2 38.1* 40.7 42.7  MCV 91.6 90.1 92.5 91.8  PLT 197 214 221 298     Recent Results (from the past 240 hour(s))  Urine Culture     Status: None   Collection Time: 05/22/20 11:59 PM   Specimen: Urine, Random  Result Value Ref Range Status   Specimen Description   Final    URINE, RANDOM Performed at [facility], [address] [place], [place] [address]    Special Requests   Final    Normal Performed at [facility], [address], [place], [place] [address]  Culture   Final    NO GROWTH Performed at [facility], [place] [address] [place], [place] [address]    Report Status 05/24/2020 FINAL  Final     Scheduled Meds: . vitamin C  500 mg Oral Daily  . aspirin EC  81 mg Oral Daily  . carbidopa-levodopa  0.5 tablet Oral TID WC  . cholecalciferol  5,000 Units Oral Daily  . donepezil  10 mg Oral QHS  . enoxaparin (LOVENOX) injection  40 mg Subcutaneous Q24H  . famotidine  20 mg Oral Daily  . levothyroxine  50 mcg Oral Q0600  . melatonin  10 mg Oral q1800  . metoprolol tartrate  25 mg Oral BID  . mirtazapine  45 mg Oral q1800  . multivitamin with minerals  1 tablet Oral Daily  . pantoprazole  40 mg Oral Daily  . traZODone  50 mg Oral q1800  . vitamin B-12  5,000 mcg Oral Daily  . zinc sulfate  220 mg Oral Daily   Continuous Infusions:  Assessment/Plan:  1. Acute delirium with history of Lewy body dementia.  The  patient's wife states that he normally goes to bed at 7 PM and they have been giving his nighttime medications at 10 PM.  I switched his nighttime medications to 6 PM.  Hopefully if we get him sleeping tonight we will reset the time clock. 2. Atrial fibrillation with rapid ventricular response.  I added metoprolol this morning and will continue for right now.  Hold off on anticoagulation secondary to delirium and fall risk. 3. COVID-19 infection.  Patient completed 5 days of remdesivir.  Hold on steroids secondary to altered mental status no hypoxia or no pneumonia. 4. Hypothyroidism unspecified on levothyroxine 5. Parkinson's disease on Sinemet 6. Chronic kidney disease stage IIIb.  Continue to monitor 7. His lower dentures were lost last night.  Patient on dysphagia diet with thickened liquids. 8. Patient made a DO NOT INTUBATE today.     Code Status:     Code Status Orders  (From admission, onward)         Start     Ordered   05/30/20 1321  Limited resuscitation (code)  Continuous       Question Answer Comment  In the event of cardiac or respiratory ARREST: Initiate Code Blue, Call Rapid Response Yes   In the event of cardiac or respiratory ARREST: Perform CPR Yes   In the event of cardiac or respiratory ARREST: Perform Intubation/Mechanical Ventilation No   In the event of cardiac or respiratory ARREST: Use NIPPV/BiPAp only if indicated Yes   In the event of cardiac or respiratory ARREST: Administer ACLS medications if indicated Yes   In the event of cardiac or respiratory ARREST: Perform Defibrillation or Cardioversion if indicated Yes      05/30/20 1320        Code Status History    Date Active Date Inactive Code Status Order ID Comments User Context   05/23/2020 0204 05/30/2020 1320 Full Code [id number]  Mansy, Arvella Merles, MD ED   Advance Care Planning Activity     Family Communication: Spoke with wife at the bedside Disposition Plan: Status is: Inpatient  Dispo: The patient  is from: Home              Anticipated d/c is to: Rehab              Anticipated d/c date is: Looks like unable to have a rehab bed until  06/05/2020              Patient currently being treated for atrial fibrillation with rapid ventricular response.  Patient also has acute delirium.  Time spent: 28 minutes  [place]

## 2020-05-31 DIAGNOSIS — Z515 Encounter for palliative care: Secondary | ICD-10-CM

## 2020-05-31 DIAGNOSIS — E870 Hyperosmolality and hypernatremia: Secondary | ICD-10-CM

## 2020-05-31 DIAGNOSIS — E860 Dehydration: Secondary | ICD-10-CM

## 2020-05-31 DIAGNOSIS — N189 Chronic kidney disease, unspecified: Secondary | ICD-10-CM

## 2020-05-31 DIAGNOSIS — Z7189 Other specified counseling: Secondary | ICD-10-CM

## 2020-05-31 DIAGNOSIS — N179 Acute kidney failure, unspecified: Secondary | ICD-10-CM | POA: Diagnosis not present

## 2020-05-31 DIAGNOSIS — I4891 Unspecified atrial fibrillation: Secondary | ICD-10-CM | POA: Diagnosis not present

## 2020-05-31 DIAGNOSIS — R531 Weakness: Secondary | ICD-10-CM | POA: Diagnosis not present

## 2020-05-31 DIAGNOSIS — G20 Parkinson's disease: Secondary | ICD-10-CM | POA: Diagnosis not present

## 2020-05-31 DIAGNOSIS — U071 COVID-19: Secondary | ICD-10-CM | POA: Diagnosis not present

## 2020-05-31 LAB — CBC
HCT: 43.9 % (ref 39.0–52.0)
Hemoglobin: 13.7 g/dL (ref 13.0–17.0)
MCH: 29 pg (ref 26.0–34.0)
MCHC: 31.2 g/dL (ref 30.0–36.0)
MCV: 93 fL (ref 80.0–100.0)
Platelets: 367 10*3/uL (ref 150–400)
RBC: 4.72 MIL/uL (ref 4.22–5.81)
RDW: 14.1 % (ref 11.5–15.5)
WBC: 11.3 10*3/uL — ABNORMAL HIGH (ref 4.0–10.5)
nRBC: 0 % (ref 0.0–0.2)

## 2020-05-31 LAB — BASIC METABOLIC PANEL
Anion gap: 13 (ref 5–15)
BUN: 58 mg/dL — ABNORMAL HIGH (ref 8–23)
CO2: 28 mmol/L (ref 22–32)
Calcium: 9.4 mg/dL (ref 8.9–10.3)
Chloride: 121 mmol/L — ABNORMAL HIGH (ref 98–111)
Creatinine, Ser: 2.01 mg/dL — ABNORMAL HIGH (ref 0.61–1.24)
GFR, Estimated: 33 mL/min — ABNORMAL LOW (ref >60)
Glucose, Bld: 133 mg/dL — ABNORMAL HIGH (ref 70–99)
Potassium: 3.5 mmol/L (ref 3.5–5.1)
Sodium: 162 mmol/L (ref 135–145)

## 2020-05-31 LAB — C-REACTIVE PROTEIN: CRP: 7.7 mg/dL — ABNORMAL HIGH (ref <1.0)

## 2020-05-31 LAB — METHYLMALONIC ACID, SERUM: Methylmalonic Acid, Quantitative: 187 nmol/L (ref 0–378)

## 2020-05-31 MED ORDER — SODIUM CHLORIDE 0.9 % IV BOLUS
500.0000 mL | Freq: Once | INTRAVENOUS | Status: AC
  Administered 2020-05-31: 500 mL via INTRAVENOUS

## 2020-05-31 MED ORDER — SODIUM CHLORIDE 0.45 % IV SOLN: INTRAVENOUS | Status: DC

## 2020-05-31 MED ORDER — METOPROLOL TARTRATE 25 MG PO TABS
25.0000 mg | ORAL_TABLET | Freq: Three times a day (TID) | ORAL | Status: DC
  Administered 2020-05-31 – 2020-06-01 (×2): 25 mg via ORAL
  Filled 2020-05-31 (×4): qty 1

## 2020-05-31 MED ORDER — ENOXAPARIN SODIUM 30 MG/0.3ML ~~LOC~~ SOLN
30.0000 mg | SUBCUTANEOUS | Status: DC
  Administered 2020-06-01 – 2020-06-04 (×4): 30 mg via SUBCUTANEOUS
  Filled 2020-05-31 (×4): qty 0.3

## 2020-05-31 MED ORDER — SODIUM CHLORIDE 0.9 % IV SOLN: INTRAVENOUS | Status: DC

## 2020-05-31 MED ORDER — POTASSIUM CHLORIDE 20 MEQ PO PACK
40.0000 meq | PACK | Freq: Once | ORAL | Status: AC
  Administered 2020-05-31: 10:00:00 40 meq via ORAL
  Filled 2020-05-31: qty 2

## 2020-05-31 MED ORDER — RESOURCE THICKENUP CLEAR PO POWD
ORAL | Status: DC | PRN
  Filled 2020-05-31: qty 125

## 2020-05-31 NOTE — Progress Notes (Signed)
Patient ID: Blake Bates, male   DOB: 29-Aug-1940, 80 y.o.   MRN: [id number] [facility] PROGRESS NOTE  Blake Bates [id number] DOB: 06/10/1940 DOA: 05/22/2020 PCP: [name], DO  HPI/Subjective: Seen this morning with his wife and he was sitting at the edge of the bed and nursing staff was giving him medications.  The patient was able to sleep last night.  Unable to offer any complaints to me.  Heart rate was fast this morning with sitting up in the 150s.  Admitted 05/23/2020 with confusion and found to have COVID-19 infection.  Objective: Vitals:   05/31/20 0758 05/31/20 1158  BP: (!) 118/91 119/88  Pulse: (!) 103 81  Resp: 18 18  Temp: 98.4 F (36.9 C) 97.9 F (36.6 C)  SpO2: (!) 82% 94%    Intake/Output Summary (Last 24 hours) at 05/31/2020 1419 Last data filed at 05/31/2020 1039 Gross per 24 hour  Intake --  Output 1300 ml  Net -1300 ml   Filed Weights   05/22/20 1802  Weight: 56.7 kg    ROS: Review of Systems  Unable to perform ROS: Acuity of condition  Respiratory: Negative for shortness of breath.   Cardiovascular: Negative for chest pain and palpitations.   Exam: Physical Exam HENT:     Head: Normocephalic.     Mouth/Throat:     Pharynx: No oropharyngeal exudate.  Eyes:     General: Lids are normal.     Conjunctiva/sclera: Conjunctivae normal.  Cardiovascular:     Rate and Rhythm: Tachycardia present. Rhythm irregularly irregular.     Heart sounds: Normal heart sounds, S1 normal and S2 normal.  Pulmonary:     Breath sounds: Examination of the right-lower field reveals decreased breath sounds. Examination of the left-lower field reveals decreased breath sounds. Decreased breath sounds present. No wheezing, rhonchi or rales.  Abdominal:     Palpations: Abdomen is soft.     Tenderness: There is no abdominal tenderness.  Musculoskeletal:     Right lower leg: No swelling.     Left lower leg: No swelling.  Skin:    General:  Skin is warm.     Findings: No rash.  Neurological:     Mental Status: He is alert.     Comments: Answers a few questions.  Was very tired from sitting up.       Data Reviewed: Basic Metabolic Panel: Recent Labs  Lab 05/25/20 0459 05/26/20 0548 05/27/20 0528 05/28/20 0724 05/31/20 0457  NA 141 146* 150* 153* 162*  K 3.8 4.1 3.7 3.7 3.5  CL 107 107 114* 116* 121*  CO2 22 27 24 27 28   GLUCOSE 138* 113* 116* 128* 133*  BUN 35* 41* 44* 46* 58*  CREATININE 1.63* 1.59* 1.40* 1.54* 2.01*  CALCIUM 8.9 9.4 9.1 9.4 9.4    Recent Labs  Lab 05/29/20 1648  AMMONIA 11   CBC: Recent Labs  Lab 05/25/20 0459 05/26/20 0548 05/29/20 0432 05/31/20 0457  WBC 6.4 6.2 9.5 11.3*  NEUTROABS 5.0 4.7  --   --   HGB 12.7* 13.0 13.7 13.7  HCT 38.1* 40.7 42.7 43.9  MCV 90.1 92.5 91.8 93.0  PLT 214 221 298 367     Recent Results (from the past 240 hour(s))  Urine Culture     Status: None   Collection Time: 05/22/20 11:59 PM   Specimen: Urine, Random  Result Value Ref Range Status   Specimen Description   Final    URINE, RANDOM  Performed at [facility], [address] [place], [place] [address]    Special Requests   Final    Normal Performed at [facility], [address] [place], [place] [address]    Culture   Final    NO GROWTH Performed at [facility], [place] [address], [place], [place] [address]    Report Status 05/24/2020 FINAL  Final      Scheduled Meds: . vitamin C  500 mg Oral Daily  . aspirin EC  81 mg Oral Daily  . carbidopa-levodopa  0.5 tablet Oral TID WC  . cholecalciferol  5,000 Units Oral Daily  . donepezil  10 mg Oral QHS  . [START ON 06/01/2020] enoxaparin (LOVENOX) injection  30 mg Subcutaneous Q24H  . famotidine  20 mg Oral Daily  . levothyroxine  50 mcg Oral Q0600  . melatonin  10 mg Oral q1800  . metoprolol tartrate  25 mg Oral TID  . mirtazapine  45 mg Oral q1800  . multivitamin with minerals  1 tablet Oral Daily  .  pantoprazole  40 mg Oral Daily  . traZODone  50 mg Oral q1800  . vitamin B-12  5,000 mcg Oral Daily  . zinc sulfate  220 mg Oral Daily   Continuous Infusions: . sodium chloride 60 mL/hr at 05/31/20 1259    Assessment/Plan:  1. Acute delirium with history of Lewy body dementia.  Patient's mental status little bit better today after having better nights rest.  His nighttime medications given earlier because he normally goes to bed at 7 PM. 2. Hypernatremia.  Sodium up to 162 today.  Palliative care consultation.  Gave IV fluid bolus and then gentle fluids with half-normal saline.  Recheck BMP tomorrow 3. Acute kidney injury on chronic kidney disease stage IIIb.  Creatinine up at 2.01 today and was 1.54 the other day.  Gentle IV fluids and recheck creatinine tomorrow. 4. Atrial fibrillation with rapid ventricular response.  Heart rate went up to 150 with sitting today.  Increase metoprolol to 25 mg 3 times daily.  Since he goes to bed at seven we will try to give last dose at that time.  Hold off on anticoagulation secondary to delirium and fall risk. 5. COVID-19 infection.  Patient completed 5 days of remdesivir.  Hold off on any steroids secondary to altered mental status. 6. Hypothyroidism unspecified on levothyroxine 7. Parkinson's disease on Sinemet 8. Nursing staff was able to find his lower dentures. 9. Patient is a DO NOT INTUBATE.        Code Status:     Code Status Orders  (From admission, onward)         Start     Ordered   05/30/20 1321  Limited resuscitation (code)  Continuous       Question Answer Comment  In the event of cardiac or respiratory ARREST: Initiate Code Blue, Call Rapid Response Yes   In the event of cardiac or respiratory ARREST: Perform CPR Yes   In the event of cardiac or respiratory ARREST: Perform Intubation/Mechanical Ventilation No   In the event of cardiac or respiratory ARREST: Use NIPPV/BiPAp only if indicated Yes   In the event of cardiac or  respiratory ARREST: Administer ACLS medications if indicated Yes   In the event of cardiac or respiratory ARREST: Perform Defibrillation or Cardioversion if indicated Yes      05/30/20 1320        Code Status History    Date  Active Date Inactive Code Status Order ID Comments User Context   05/23/2020 0204 05/30/2020 1320 Full Code [id number]  Mansy, Arvella Merles, MD ED   Advance Care Planning Activity     Family Communication: Spoke with wife at the bedside Disposition Plan: Status is: Inpatient  Dispo: The patient is from: Home              Anticipated d/c is to: Rehab              Anticipated d/c date is: May have a rehab bed on 1/25              Patient currently being treated for hypernatremia, acute kidney injury and rapid atrial fibrillation.  Not ready to get out of the hospital at this point  Time spent: 27 minutes  [place]

## 2020-05-31 NOTE — Progress Notes (Signed)
Patient's lower dentures found under the bed. Dentures brushed with toothbrush and toothpaste and cleansed. Dentures handed to patient's wife, who was present at bedside.

## 2020-05-31 NOTE — Progress Notes (Signed)
NP Stark Klein and [place] RN made aware of critical Na 162

## 2020-05-31 NOTE — Consult Note (Signed)
Consultation Note Date: 05/31/2020   Patient Name: Blake Bates  DOB: [birth year]  MRN: [id number]  Age / Sex: 80 y.o., male  PCP: [name], DO Referring Physician: Loletha Grayer, MD  Reason for Consultation: Establishing goals of care  HPI/Patient Profile: 80 y.o. male  with past medical history of Lewy body dementia, Parkinson's disease, asthma, dyslipidemia, hypothryroidism admitted on 05/22/2020 with altered mental status and weakness. COVID-19 PCR positive. Course of hospitalization complicated by acute delirium, hypernatremia, AKI on CKD stage IIIb, afib RVR receiving medical management. Palliative medicine consultation for goals of care.   Clinical Assessment and Goals of Care:  I have reviewed medical records, discussed with care team, and met with patient and wife at bedside. [name] is sitting up in recliner. Baseline, he is pleasantly confused. He knows his name but otherwise disoriented and has experienced hospital delirium this admission. During visit, remains comfortable and asking for lunch.   Wife at bedside to discuss goals of care.   I introduced Palliative Medicine as specialized medical care for people living with serious illness. It focuses on providing relief from the symptoms and stress of a serious illness. The goal is to improve quality of life for both the patient and the family.  We discussed a brief life review of the patient. They have been married for over 26 years. Three children, one son deceased. Patient was diagnosed with dementia approximately 3 years ago and following this was a diagnosis of Parkinson's. Prior to admission, patient is able to ambulate with a walker and feed himself. He does require assist with dressing and bathing. Appetite has been poor but he enjoys muscle milks and will sometimes drink 3 per day. On a daily basis, wife encourages him to  ambulate loops around their home. He does well with this. Wife reports they are relocating soon and currently building a small, handicap accessible home behind their daughter's home. She hopes this will be completed by March.  Discussed events leading up to admission and course of hospitalization including diagnoses, interventions, plan of care. Plan is SNF rehab on discharge. [name] is too weak to return straight home. Wife reports he worked well with physical therapy this morning. Discussed chronic, progressive nature of his dementia/parkinson's disease.  I attempted to elicit values and goals of care important to the wife. Advanced directives, concepts specific to code status, artifical feeding and hydration were briefly discussed. Wife reports he has a completed living will. She plans to bring this to [facility] tomorrow so copy can be scanned into EMR. Wife confirms her decision for partial code status, NO intubation as her husband would not wish to be on machines. She shares her decision at this point for CPR if necessary due to new onset a-fib. This is also the first time [name] has required hospitalization for decline. She has not previously been faced with these decisions. Encouraged ongoing discussions with her family.   Explained to wife that I will bring a copy of Hard Choices to her and review MOST  form with her tomorrow, an opportunity for her to consider limitations to care. Also discuss with her children. Encouraged hoping for the best but also preparing for decisions they will face in the future with his chronic, progressive condition.   Answered questions. Reassured of PMT provider f/u tomorrow 1/21.     SUMMARY OF RECOMMENDATIONS    Great initial palliative discussion.   Continue current plan of care and medical management.  Plan is SNF rehab on discharge. Wife open to outpatient palliative f/u at SNF.  PMT provider will f/u tomorrow to provide Hard Choices booklet and review MOST  form. Encouraged consideration of limitations to care with chronic, irreversible condition. Also ongoing conversations with care team and her children regarding goals and wishes for [place].   Code Status/Advance Care Planning:  Limited code: no intubation  Symptom Management:   Per attending  Palliative Prophylaxis:   Aspiration, Delirium Protocol, Oral Care and Turn Reposition  Psycho-social/Spiritual:   Desire for further Chaplaincy support: yes  Additional Recommendations: Caregiving  Support/Resources and Compassionate Wean Education  Prognosis:   Unable to determine  Discharge Planning: [place] for rehab with Palliative care service follow-up      Primary Diagnoses: Present on Admission: . COVID-19   I have reviewed the medical record, interviewed the patient and family, and examined the patient. The following aspects are pertinent.  Past Medical History:  Diagnosis Date  . Asthma   . Diverticulosis   . Full dentures   . Hyperlipidemia   . Hypothyroidism    s/p subtotal thyroidectomy  . Muscle spasms of lower extremity    Right upper leg  . Osteoporosis   . Rhinitis, allergic    pollens, mold, animal dander   Social History   Socioeconomic History  . Marital status: Married    Spouse name: Not on file  . Number of children: Not on file  . Years of education: Not on file  . Highest education level: Doctorate  Occupational History  . Occupation: retired  Tobacco Use  . Smoking status: Former Smoker    Packs/day: 0.00    Years: 60.00    Pack years: 0.00    Types: Pipe    Quit date: 05/12/2016    Years since quitting: 4.0  . Smokeless tobacco: Former Systems developer  . Tobacco comment: Self taper down from full pipe daily down to half to quarter pipe  Vaping Use  . Vaping Use: Never used  Substance and Sexual Activity  . Alcohol use: Not Currently    Comment: occasionally beer or wine   . Drug use: No  . Sexual activity: Not on file   Other Topics Concern  . Not on file  [place] lives in 1 story home with his wife   Right handed   Has 2 surviving children   PhD in Biology   Retired professor   Social Determinants of Radio broadcast assistant Strain: [place]   . Difficulty of Paying Living Expenses: Not hard at all  Food Insecurity: No Food Insecurity  . Worried About Charity fundraiser in the Last Year: Never true  . Ran Out of Food in the Last Year: Never true  Transportation Needs: No Transportation Needs  . Lack of Transportation (Medical): No  . Lack of Transportation (Non-Medical): No  Physical Activity: Inactive  . Days of Exercise per Week: 0 days  . Minutes of  Exercise per Session: 0 min  Stress: No Stress Concern Present  . Feeling of Stress : Not at all  Social Connections: Moderately Integrated  . Frequency of Communication with Friends and Family: More than three times a week  . Frequency of Social Gatherings with Friends and Family: More than three times a week  . Attends Religious Services: More than 4 times per year  . Active Member of Clubs or Organizations: No  . Attends Archivist Meetings: Never  . Marital Status: Married   Family History  Problem Relation Age of Onset  . Stroke Mother 66  . Lung cancer Father 59  . Prostate cancer Paternal Grandfather 62  . Stroke Paternal Aunt    Scheduled Meds: . vitamin C  500 mg Oral Daily  . aspirin EC  81 mg Oral Daily  . carbidopa-levodopa  0.5 tablet Oral TID WC  . cholecalciferol  5,000 Units Oral Daily  . donepezil  10 mg Oral QHS  . [START ON 06/01/2020] enoxaparin (LOVENOX) injection  30 mg Subcutaneous Q24H  . famotidine  20 mg Oral Daily  . levothyroxine  50 mcg Oral Q0600  . melatonin  10 mg Oral q1800  . metoprolol tartrate  25 mg Oral TID  . mirtazapine  45 mg Oral q1800  . multivitamin with minerals  1 tablet Oral Daily  . pantoprazole  40 mg Oral Daily  .  traZODone  50 mg Oral q1800  . vitamin B-12  5,000 mcg Oral Daily  . zinc sulfate  220 mg Oral Daily   Continuous Infusions: . sodium chloride 60 mL/hr at 05/31/20 1259   PRN Meds:.acetaminophen, chlorpheniramine-HYDROcodone, diltiazem, guaiFENesin-dextromethorphan, hydrALAZINE, loratadine, magnesium hydroxide, ondansetron **OR** ondansetron (ZOFRAN) IV Medications Prior to Admission:  Prior to Admission medications   Medication Sig Start Date End Date Taking? Authorizing Provider  acetaminophen (TYLENOL) 650 MG CR tablet Take 650 mg by mouth daily.   Yes [provider]  albuterol (PROVENTIL HFA;VENTOLIN HFA) 108 (90 BASE) MCG/ACT inhaler Inhale 2 puffs into the lungs every 6 (six) hours as needed for wheezing or shortness of breath.   Yes [provider]  Cholecalciferol (VITAMIN D-3 PO) Take 5,000 Units by mouth at bedtime.   Yes [provider]  Cyanocobalamin (VITAMIN B-12) 5000 MCG SUBL Place 5,000 mcg under the tongue daily.   Yes [provider]  donepezil (ARICEPT) 10 MG tablet Take 1 tablet daily Patient taking differently: Take 10 mg by mouth daily at 12 noon. 03/06/20  Yes Cameron Sprang, MD  levothyroxine (SYNTHROID) 50 MCG tablet TAKE 1 TABLET(50 MCG) BY MOUTH DAILY BEFORE BREAKFAST Patient taking differently: Take 50 mcg by mouth daily before breakfast. 04/07/20  Yes Karamalegos, Devonne Doughty, DO  loratadine (CLARITIN) 10 MG tablet Take 10 mg by mouth daily as needed for allergies.   Yes [provider]  Melatonin 10 MG TBCR Take 10 mg by mouth at bedtime.   Yes [provider]  meloxicam (MOBIC) 15 MG tablet TAKE 1 TABLET BY MOUTH DAILY AS NEEDED FOR PAIN AND ARTHRITIS FLARE, TAKE UP TO 1 TO 2 WEEKS AT A TIME Patient taking differently: Take 15 mg by mouth daily as needed for pain (arthritis flare). TAKE 1 TABLET BY MOUTH DAILY AS NEEDED FOR PAIN AND ARTHRITIS FLARE, TAKE UP TO 1 TO 2 WEEKS AT A TIME 12/28/19  Yes Karamalegos,  Devonne Doughty, DO  mirtazapine (REMERON) 45 MG tablet Take 1 tablet (45 mg total) by mouth at bedtime.  04/10/20  Yes Cameron Sprang, MD  carbidopa-levodopa (SINEMET IR) 25-100 MG tablet Take 1/2 tablet three times a day with meals Patient taking differently: Take 0.5 tablets by mouth 3 (three) times daily with meals. 03/06/20   Cameron Sprang, MD   Allergies  Allergen Reactions  . Mold Extract  [Trichophyton]   . Morphine Anxiety and Other (See Comments)   Review of Systems  Unable to perform ROS: Dementia   Physical Exam Vitals and nursing note reviewed.  Constitutional:      General: He is awake.     Appearance: He is cachectic. He is ill-appearing.  HENT:     Head: Normocephalic and atraumatic.  Cardiovascular:     Rate and Rhythm: Rhythm irregularly irregular.  Pulmonary:     Effort: No tachypnea, accessory muscle usage or respiratory distress.  Skin:    General: Skin is warm and dry.  Neurological:     Mental Status: He is alert.     Comments: Oriented to self. Otherwise disoriented with baseline dementia/parkinsons  Psychiatric:        Attention and Perception: He is inattentive.        Speech: Speech is delayed.        Cognition and Memory: Cognition is impaired.    Vital Signs: BP 119/88 (BP Location: Right Arm)   Pulse 81   Temp 97.9 F (36.6 C)   Resp 18   Ht 5' 11"  (1.803 m)   Wt 56.7 kg   SpO2 94%   BMI 17.43 kg/m  Pain Scale: 0-10 POSS *See Group Information*: 1-Acceptable,Awake and alert Pain Score: 0-No pain   SpO2: SpO2: 94 % O2 Device:SpO2: 94 % O2 Flow Rate: .O2 Flow Rate (L/min): 2 L/min  IO: Intake/output summary:   Intake/Output Summary (Last 24 hours) at 05/31/2020 1643 Last data filed at 05/31/2020 1039 Gross per 24 hour  Intake -  Output 1300 ml  Net -1300 ml    LBM: Last BM Date: 05/28/20 Baseline Weight: Weight: 56.7 kg Most recent weight: Weight: 56.7 kg     Palliative Assessment/Data: PPS 50%   Flowsheet Rows   Flowsheet  Row Most Recent Value  Intake Tab   Referral Department Hospitalist  Unit at Time of Referral Med/Surg Unit  Palliative Care Primary Diagnosis Neurology  Palliative Care Type New Palliative care  Reason for referral Clarify Goals of Care  Date first seen by Palliative Care 05/31/20  Clinical Assessment   Palliative Performance Scale Score 50%  Psychosocial & Spiritual Assessment   Palliative Care Outcomes   Patient/Family meeting held? Yes  Who was at the meeting? wife at bedside  [place] goals of care, Provided psychosocial or spiritual support, ACP counseling assistance, Linked to palliative care logitudinal support       Time Total: 41 Greater than 50%  of this time was spent counseling and coordinating care related to the above assessment and plan.  Signed by:  Ihor Dow, DNP, FNP-C Palliative Medicine Team  Phone: [phone] Fax: [phone]   Please contact Palliative Medicine Team phone at [phone] for questions and concerns.  For individual provider: See Shea Evans

## 2020-05-31 NOTE — Progress Notes (Signed)
Physical Therapy Treatment Patient Details Name: Blake Bates MRN: [id number] DOB: 12/30/1940 Today's Date: 05/31/2020    History of Present Illness "Blake" Bates is a 17 yoM who comes to [facility] on 05/22/20 c inability to walk, acute on chronic AMS. Patient tested (+) COVID19. PMH: Lewy Bodies, PD, baseline AMB difficulty with RW, HLD, hypoTSH, osteoporosis. Pt febrile in ED. CXR revealing of new mid thoracic compression fracture 40% height loss, chronicity unknown.    PT Comments    Pt was seen twice today by acute PT. First session was spent assisting pt OOB to recliner with focus on sitting/standing balance in addition to improve safety. PT required max assist of one throughout session with 2nd person close for safety. Do recommend +2 assistance for safety in future. He was able to exit L side of bed with max assist + increased time. Is anxious at first while sitting EOB with pt grabbing author due to anxiousness. Relaxation technique used and pt able to progress to stand pivot to recliner. Max assist + max vcs for safety to stand pivot. Pt was able to tolerate sitting in recliner x 2 hours prior to stand pivot back to bed. Pt very fatigued after sitting in chair and quickly falls asleep once returned to bed via max assist. Pt continues to be not at baseline physically. Would greatly benefit from SNF at [place] to address deficits while improving independence with ADLs    Follow Up Recommendations  SNF     Equipment Recommendations  Rolling walker with 5" wheels    Recommendations for Other Services       Precautions / Restrictions Precautions Precautions: Fall Restrictions Weight Bearing Restrictions: No    Mobility  Bed Mobility Overal bed mobility: Needs Assistance Bed Mobility: Supine to Sit;Sit to Supine     Supine to sit: +2 for safety/equipment;Max assist (+1 assist but recommend +2 in future) Sit to supine: Max assist;HOB elevated;+2 for safety/equipment   General  bed mobility comments: Spouse in room and was close +2 assist during session however only +1 max assist required.  Transfers Overall transfer level: Needs assistance Equipment used: None (Supported under BUEs/[place] for safety) Transfers: Stand Pivot Transfers Sit to Stand: Max assist;From elevated surface Stand pivot transfers: +2 safety/equipment;From elevated surface;Max assist       General transfer comment: pt was able to stand pivot to/from recliner with max assist + max vcs. 2nd person in room for safety. recommend +2 assist for all transfers. Pt very anxious in standing and has tight grip on author throughout standing activity  Ambulation/Gait             General Gait Details: unable to safely progress to ambulation      Balance Overall balance assessment: Needs assistance Sitting-balance support: Feet supported;Bilateral upper extremity supported Sitting balance-Leahy Scale: Poor Sitting balance - Comments: requires constant assistance( min-mod) to maintain sitting EOB x > 5 minutes with constant vcs for relaxation and decrease anxiousness   Standing balance support: During functional activity;Bilateral upper extremity supported Standing balance-Leahy Scale: Poor Standing balance comment: max assist to maintain standing       Cognition Arousal/Alertness: Lethargic Behavior During Therapy: Anxious;Impulsive Overall Cognitive Status: History of cognitive impairments - at baseline        General Comments: patient needs extra time, repetition, and hand over hand to follow single step commands             Pertinent Vitals/Pain Pain Assessment:  (moans/groans at times with mobility but  unable to clearly state where pain is located) Faces Pain Scale: No hurt           PT Goals (current goals can now be found in the care plan section) Acute Rehab PT Goals Patient Stated Goal: none stated Progress towards PT goals: Progressing toward goals (slow progress 2/2  to cognition and anxiety with mobility)    Frequency    Min 2X/week      PT Plan Current plan remains appropriate    Co-evaluation     PT goals addressed during session: Mobility/safety with mobility;Balance;Proper use of DME;Strengthening/ROM        AM-PAC PT "6 Clicks" Mobility   Outcome Measure  Help needed turning from your back to your side while in a flat bed without using bedrails?: A Lot Help needed moving from lying on your back to sitting on the side of a flat bed without using bedrails?: A Lot Help needed moving to and from a bed to a chair (including a wheelchair)?: Total Help needed standing up from a chair using your arms (e.g., wheelchair or bedside chair)?: Total Help needed to walk in hospital room?: Total Help needed climbing 3-5 steps with a railing? : Total 6 Click Score: 8    End of Session Equipment Utilized During Treatment: Gait belt Activity Tolerance: Patient limited by fatigue;Patient tolerated treatment well Patient left: in bed;with call bell/phone within reach;with bed alarm set;with family/visitor present Nurse Communication: Mobility status PT Visit Diagnosis: Unsteadiness on feet (R26.81);Other abnormalities of gait and mobility (R26.89);Repeated falls (R29.6);Muscle weakness (generalized) (M62.81);Other symptoms and signs involving the nervous system (R29.898)     Time: 1050-1130 PT Time Calculation (min) (ACUTE ONLY): 40 min  Charges:  $Therapeutic Activity: 38-52 mins                     Julaine Fusi PTA 05/31/20, 1:51 PM

## 2020-05-31 NOTE — Progress Notes (Signed)
Charge notified of Crit[facility]. Provider has been updated on occurrence. No new orders at this time

## 2020-05-31 NOTE — Progress Notes (Signed)
PHARMACIST - PHYSICIAN COMMUNICATION  CONCERNING:  Enoxaparin (Lovenox) for DVT Prophylaxis    RECOMMENDATION: Patient was prescribed enoxaprin 40mg  q24 hours for VTE prophylaxis.   Filed Weights   05/22/20 1802  Weight: 56.7 kg (125 lb)    Body mass index is 17.43 kg/m.  Estimated Creatinine Clearance: 23.5 mL/min (A) (by C-G formula based on SCr of 2.01 mg/dL (H)).   Patient is candidate for enoxaparin 30mg  every 24 hours based on CrCl <53ml/min   DESCRIPTION: Pharmacy has adjusted enoxaparin dose per [facility] policy.  Patient is now receiving enoxaparin 30 mg every 24 hours    Lu Duffel, PharmD Clinical Pharmacist  05/31/2020 9:55 AM

## 2020-06-01 DIAGNOSIS — R531 Weakness: Secondary | ICD-10-CM

## 2020-06-01 DIAGNOSIS — I4891 Unspecified atrial fibrillation: Secondary | ICD-10-CM | POA: Diagnosis not present

## 2020-06-01 DIAGNOSIS — N179 Acute kidney failure, unspecified: Secondary | ICD-10-CM | POA: Diagnosis not present

## 2020-06-01 DIAGNOSIS — U071 COVID-19: Secondary | ICD-10-CM | POA: Diagnosis not present

## 2020-06-01 DIAGNOSIS — E870 Hyperosmolality and hypernatremia: Secondary | ICD-10-CM | POA: Diagnosis not present

## 2020-06-01 DIAGNOSIS — E860 Dehydration: Secondary | ICD-10-CM | POA: Diagnosis not present

## 2020-06-01 DIAGNOSIS — N189 Chronic kidney disease, unspecified: Secondary | ICD-10-CM | POA: Diagnosis not present

## 2020-06-01 DIAGNOSIS — G3183 Dementia with Lewy bodies: Secondary | ICD-10-CM | POA: Diagnosis not present

## 2020-06-01 LAB — BASIC METABOLIC PANEL
Anion gap: 14 (ref 5–15)
BUN: 59 mg/dL — ABNORMAL HIGH (ref 8–23)
CO2: 25 mmol/L (ref 22–32)
Calcium: 9.4 mg/dL (ref 8.9–10.3)
Chloride: 122 mmol/L — ABNORMAL HIGH (ref 98–111)
Creatinine, Ser: 2.09 mg/dL — ABNORMAL HIGH (ref 0.61–1.24)
GFR, Estimated: 31 mL/min — ABNORMAL LOW (ref >60)
Glucose, Bld: 121 mg/dL — ABNORMAL HIGH (ref 70–99)
Potassium: 3.6 mmol/L (ref 3.5–5.1)
Sodium: 161 mmol/L (ref 135–145)

## 2020-06-01 LAB — C-REACTIVE PROTEIN: CRP: 5.4 mg/dL — ABNORMAL HIGH (ref <1.0)

## 2020-06-01 MED ORDER — POTASSIUM CL IN DEXTROSE 5% 20 MEQ/L IV SOLN
20.0000 meq | INTRAVENOUS | Status: DC
  Administered 2020-06-01: 13:00:00 20 meq via INTRAVENOUS
  Filled 2020-06-01 (×5): qty 1000

## 2020-06-01 MED ORDER — METOPROLOL TARTRATE 25 MG PO TABS: 25.0000 mg | ORAL_TABLET | Freq: Two times a day (BID) | ORAL | Status: DC

## 2020-06-01 MED ORDER — METOPROLOL TARTRATE 25 MG PO TABS
25.0000 mg | ORAL_TABLET | Freq: Two times a day (BID) | ORAL | Status: DC
  Administered 2020-06-01: 21:00:00 25 mg via ORAL

## 2020-06-01 NOTE — Progress Notes (Signed)
SLP F/U Note  Patient Details Name: Blake Bates MRN: [id number] DOB: 07-15-1940   Cancelled treatment:       Reason Eval/Treat Not Completed:  (chart reviewed). Pt remains on a dysphagia level 1 w/ Nectar liquids diet for safer oral intake in light of Baseline Lewy body Dementia and medical illness. Pt continues to have medical issues complicating his admit: Sodium 161 this morning; elevated HR.  Due to overall presentation and HIGH risk for aspiration thus Pulmonary decline at this time, recommend continue w/ current Dysphagia diet w/ aspiration precautions. ST services will f/u while admitted for appropriate time for trials to assess toleration of diet; pt may benefit from remaining on current diet during medical recovery and working on his Dysphagia at [place]. Aspiration precautions posted. MD/NSG updated.      Orinda Kenner, MS, CCC-SLP Speech Language Pathologist Rehab Services [phone] [facility] 06/01/2020, 11:46 AM

## 2020-06-01 NOTE — Progress Notes (Signed)
Daily Progress Note   Patient Name: Blake Bates       Date: 06/01/2020 DOB: July 13, 1940  Age: 80 y.o. MRN#: [id number] Attending Physician: Loletha Grayer, MD Primary Care Physician: [name], DO Admit Date: 05/22/2020  Reason for Consultation/Follow-up: Establishing goals of care  Subjective: Patient awake, alert, disoriented with baseline dementia. Comfortable.   GOC: Wife at bedside. Reviewed care plan including diagnoses, interventions, labs. Reviewed disease trajectory of dementia.   Waiting on SNF rehab bed.  Wife provides patient's documented living will/desire for natural death. Copy placed in paper chart to be scanned into EMR. Patient's documentation clearly states he would not wish for life-prolonging measures if terminal or incurable. Reviewed this with wife.   Introduced and discussed MOST form. Provided Hard Choices for her to review and consider goals/wishes for her husband. Discussed concept of treating the treatable. Also encouraged she discuss with their children.   Answered questions. Reassured wife that PMT provider will check in Monday 1/24 if he remains hospitalized. PMT contact information given.    Length of Stay: 9  Current Medications: Scheduled Meds:   vitamin C  500 mg Oral Daily   aspirin EC  81 mg Oral Daily   carbidopa-levodopa  0.5 tablet Oral TID WC   cholecalciferol  5,000 Units Oral Daily   donepezil  10 mg Oral QHS   enoxaparin (LOVENOX) injection  30 mg Subcutaneous Q24H   famotidine  20 mg Oral Daily   levothyroxine  50 mcg Oral Q0600   melatonin  10 mg Oral q1800   [START ON 06/02/2020] metoprolol tartrate  25 mg Oral BID   mirtazapine  45 mg Oral q1800   multivitamin with minerals  1 tablet Oral Daily    pantoprazole  40 mg Oral Daily   traZODone  50 mg Oral q1800   vitamin B-12  5,000 mcg Oral Daily   zinc sulfate  220 mg Oral Daily    Continuous Infusions:  dextrose 5 % with KCl 20 mEq / L 20 mEq (06/01/20 1237)    PRN Meds: acetaminophen, chlorpheniramine-HYDROcodone, diltiazem, guaiFENesin-dextromethorphan, hydrALAZINE, loratadine, magnesium hydroxide, ondansetron **OR** ondansetron (ZOFRAN) IV, Resource ThickenUp Clear  Physical Exam Vitals and nursing note reviewed.  Constitutional:      General: He is awake.     Appearance: He is cachectic.  He is ill-appearing.  Cardiovascular:     Rate and Rhythm: Rhythm irregularly irregular.  Pulmonary:     Effort: No tachypnea, accessory muscle usage or respiratory distress.  Neurological:     Mental Status: He is alert.     Comments: Confusion with baseline dementia/parkinsons             Vital Signs: BP 118/64 (BP Location: Right Arm)    Pulse 86    Temp 97.8 F (36.6 C)    Resp 16    Ht 5\' 11"  (1.803 m)    Wt 56.7 kg    SpO2 100%    BMI 17.43 kg/m  SpO2: SpO2: 100 % O2 Device: O2 Device: Room Air O2 Flow Rate: O2 Flow Rate (L/min): 2 L/min  Intake/output summary:   Intake/Output Summary (Last 24 hours) at 06/01/2020 1636 Last data filed at 06/01/2020 1500 Gross per 24 hour  Intake --  Output 1250 ml  Net -1250 ml   LBM: Last BM Date: 05/28/20 Baseline Weight: Weight: 56.7 kg Most recent weight: Weight: 56.7 kg       Palliative Assessment/Data: PPS 50%    Flowsheet Rows   Flowsheet Row Most Recent Value  Intake Tab   Referral Department Hospitalist  Unit at Time of Referral Med/Surg Unit  Palliative Care Primary Diagnosis Neurology  Palliative Care Type New Palliative care  Reason for referral Clarify Goals of Care  Date first seen by Palliative Care 05/31/20  Clinical Assessment   Palliative Performance Scale Score 50%  Psychosocial & Spiritual Assessment   Palliative Care Outcomes   Patient/Family  meeting held? Yes  Who was at the meeting? wife at bedside  [place] goals of care, Provided psychosocial or spiritual support, ACP counseling assistance, Linked to palliative care logitudinal support      Patient Active Problem List   Diagnosis Date Noted   Hypernatremia    Acute kidney injury superimposed on CKD ([place])    Atrial fibrillation with RVR ([place])    Weakness    Dehydration    Palliative care by specialist    Goals of care, counseling/discussion    Delirium    Parkinson's disease ([place])    COVID-19 05/23/2020   Normocytic anemia 01/31/2020   Recurrent falls 10/31/2019   History of CVA (cerebrovascular accident) 10/31/2019   Generalized muscle weakness 10/31/2019   Oropharyngeal dysphagia 06/21/2019   Moderate protein-calorie malnutrition ([place]) 06/21/2019   Balance disorder 06/21/2019   Left homonymous hemianopsia 06/21/2019   Pain due to onychomycosis of toenails of both feet 01/03/2019   Lewy body dementia with behavioral disturbance ([place]) 07/21/2017   Elevated PSA 12/09/2016   Vitamin D deficiency 12/09/2016   Chronic pain of right knee 11/04/2016   Hyperlipidemia 11/04/2016   CKD (chronic kidney disease), stage III ([place]) 11/04/2016   Osteoarthritis of multiple joints 11/03/2016   Chronic right hip pain 11/03/2016   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 male  with past medical history of Lewy body dementia, Parkinson's disease, asthma, dyslipidemia, hypothryroidism admitted on 05/22/2020 with altered mental status and weakness. COVID-19 PCR positive. Course of hospitalization complicated by acute delirium, hypernatremia, AKI on CKD stage IIIb, afib RVR receiving  medical management. Palliative medicine consultation for goals  of care.   Assessment: Lewy body dementia Parkinson's disease Hypernatremia AKI on CKD stage IIIb Afib RVR COVID-19 infection Weakness/deconditioning  Recommendations/Plan:   Continue current plan of care and medical management.  Plan is SNF rehab on discharge. Wife open to outpatient palliative f/u at SNF.  Wife provides patient's documented living will/desire for natural death. Patient clearly documented his wishes against life-prolonging measures if terminal or incurable. Copy placed in paper chart to be scanned into EMR. No changes in care plan today.   Introduced and discussed MOST form. Encouraged consideration of limitations to care with chronic, irreversible condition. Also ongoing conversations with care team and her children regarding goals and wishes for [place]. Hard Choices booklet given for review.   Code Status: Partial: no intubation   Code Status Orders  (From admission, onward)         Start     Ordered   05/30/20 1321  Limited resuscitation (code)  Continuous       Question Answer Comment  In the event of cardiac or respiratory ARREST: Initiate Code Blue, Call Rapid Response Yes   In the event of cardiac or respiratory ARREST: Perform CPR Yes   In the event of cardiac or respiratory ARREST: Perform Intubation/Mechanical Ventilation No   In the event of cardiac or respiratory ARREST: Use NIPPV/BiPAp only if indicated Yes   In the event of cardiac or respiratory ARREST: Administer ACLS medications if indicated Yes   In the event of cardiac or respiratory ARREST: Perform Defibrillation or Cardioversion if indicated Yes      05/30/20 1320        Code Status History    Date Active Date Inactive Code Status Order ID Comments User Context   05/23/2020 0204 05/30/2020 1320 Full Code [id number]  Mansy, Arvella Merles, MD ED   Advance Care Planning Activity       Prognosis:  Poor long-term  Discharge  Planning:  SNF rehab, outpatient palliative  Care plan was discussed with wife, RN  Thank you for allowing the Palliative Medicine Team to assist in the care of this patient.   Total Time 25 Prolonged Time Billed  no      Greater than 50%  of this time was spent counseling and coordinating care related to the above assessment and plan.  Ihor Dow, DNP, FNP-C Palliative Medicine Team  Phone: [phone] Fax: [phone]  Please contact Palliative Medicine Team phone at [phone] for questions and concerns.

## 2020-06-01 NOTE — Progress Notes (Signed)
Physical Therapy Treatment Patient Details Name: Blake Bates MRN: [id number] DOB: 31-May-1940 Today's Date: 06/01/2020    History of Present Illness "Blake" Bates is a 41 yoM who comes to [facility] on 05/22/20 c inability to walk, acute on chronic AMS. Patient tested (+) COVID19. PMH: Lewy Bodies, PD, baseline AMB difficulty with RW, HLD, hypoTSH, osteoporosis. Pt febrile in ED. CXR revealing of new mid thoracic compression fracture 40% height loss, chronicity unknown.    PT Comments    Pt long sitting in bed with spouse assisting with breakfast upon arriving. Noted high sodium levels but cleared by MD to participate. Pt continues to be very anxious with all mobility and OOB activity. Anxiety greatly impacts progression. Grabs/shaky throughout.  Max assist during session with 2nd person close for safety. Pt will need extensive PT going forward. Highly recommend DC to SNF to address deficits while improving independence.       Follow Up Recommendations  SNF     Equipment Recommendations  Rolling walker with 5" wheels    Recommendations for Other Services       Precautions / Restrictions Precautions Precautions: Fall Restrictions Weight Bearing Restrictions: No    Mobility  Bed Mobility Overal bed mobility: Needs Assistance Bed Mobility: Supine to Sit;Sit to Supine     Supine to sit: +2 for safety/equipment;Max assist     General bed mobility comments: Pt required max assist to Exit L side of bed. recommend +2 assist for safety  Transfers Overall transfer level: Needs assistance Equipment used: None Transfers: Sit to/from Stand Sit to Stand: Max assist;+2 safety/equipment;From elevated surface Stand pivot transfers: +2 safety/equipment;From elevated surface;Max assist       General transfer comment: Pt does participate in transfers with max vcs and assistance for safety. Pt very shaky/anxious with even just sitting EOB.  Ambulation/Gait             General  Gait Details: unable to safely progress to ambulation       Balance Overall balance assessment: Needs assistance Sitting-balance support: Feet supported;Bilateral upper extremity supported Sitting balance-Leahy Scale: Poor     Standing balance support: During functional activity;Bilateral upper extremity supported Standing balance-Leahy Scale: Poor       Cognition Arousal/Alertness: Lethargic Behavior During Therapy: Anxious Overall Cognitive Status: History of cognitive impairments - at baseline      General Comments: Pt was very anxious with any/all mobility             Pertinent Vitals/Pain Pain Assessment: No/denies pain           PT Goals (current goals can now be found in the care plan section) Acute Rehab PT Goals Patient Stated Goal: none stated Progress towards PT goals: Progressing toward goals    Frequency    Min 2X/week      PT Plan Current plan remains appropriate    Co-evaluation     PT goals addressed during session: Mobility/safety with mobility;Balance;Proper use of DME;Strengthening/ROM        AM-PAC PT "6 Clicks" Mobility   Outcome Measure  Help needed turning from your back to your side while in a flat bed without using bedrails?: A Lot Help needed moving from lying on your back to sitting on the side of a flat bed without using bedrails?: A Lot Help needed moving to and from a bed to a chair (including a wheelchair)?: A Lot Help needed standing up from a chair using your arms (e.g., wheelchair or bedside chair)?: Total Help  needed to walk in hospital room?: Total Help needed climbing 3-5 steps with a railing? : Total 6 Click Score: 9    End of Session Equipment Utilized During Treatment: Gait belt Activity Tolerance: Patient tolerated treatment well;Patient limited by fatigue Patient left: in chair;with call bell/phone within reach;with chair alarm set;with family/visitor present;with nursing/sitter in room Nurse  Communication: Mobility status PT Visit Diagnosis: Unsteadiness on feet (R26.81);Other abnormalities of gait and mobility (R26.89);Repeated falls (R29.6);Muscle weakness (generalized) (M62.81);Other symptoms and signs involving the nervous system (R29.898)     Time: 1937-9024 PT Time Calculation (min) (ACUTE ONLY): 30 min  Charges:  $Therapeutic Activity: 23-37 mins                     Julaine Fusi PTA 06/01/20, 1:31 PM

## 2020-06-01 NOTE — Progress Notes (Signed)
NP Ouma notified of patients run of SVT with a HR in the 170s unsustained. Patient is resting in bed with his HR down to 100.

## 2020-06-01 NOTE — Progress Notes (Signed)
Patient ID: Blake Bates, male   DOB: 13-Feb-1941, 80 y.o.   MRN: [id number] [facility] PROGRESS NOTE  DENON SCHWABE [id number] DOB: April 29, 1941 DOA: 05/22/2020 PCP: [name], DO  HPI/Subjective: Patient seen while physical therapy was there.  They set him up and were about to get him into the chair.  Patient states that he likes to eat Cheerios.  He stated he slept last night.  No complaints of shortness of breath or chest pain.  Admitted 05/23/2020 with confusion found to have COVID-19 infection.  Objective: Vitals:   06/01/20 0912 06/01/20 1027  BP: (!) 90/52 112/90  Pulse: 84 (!) 114  Resp: 16   Temp: 97.8 F (36.6 C)   SpO2: 92%     Intake/Output Summary (Last 24 hours) at 06/01/2020 1442 Last data filed at 06/01/2020 1300 Gross per 24 hour  Intake --  Output 950 ml  Net -950 ml   Filed Weights   05/22/20 1802  Weight: 56.7 kg    ROS: Review of Systems  Respiratory: Negative for shortness of breath.   Cardiovascular: Negative for chest pain.  Gastrointestinal: Negative for abdominal pain.   Exam: Physical Exam HENT:     Head: Normocephalic.     Mouth/Throat:     Pharynx: No oropharyngeal exudate.  Eyes:     General: Lids are normal.     Conjunctiva/sclera: Conjunctivae normal.  Cardiovascular:     Rate and Rhythm: Tachycardia present. Rhythm irregularly irregular.     Heart sounds: Normal heart sounds, S1 normal and S2 normal.  Pulmonary:     Breath sounds: Examination of the right-lower field reveals decreased breath sounds. Examination of the left-lower field reveals decreased breath sounds. Decreased breath sounds present. No wheezing, rhonchi or rales.  Abdominal:     Palpations: Abdomen is soft.     Tenderness: There is no abdominal tenderness.  Musculoskeletal:     Right ankle: No swelling.     Left ankle: No swelling.  Skin:    General: Skin is warm.     Comments: Macular rash on back.  Neurological:     Mental  Status: He is alert.     Comments: Patient answering more questions today than yesterday.       Data Reviewed: Basic Metabolic Panel: Recent Labs  Lab 05/26/20 0548 05/27/20 0528 05/28/20 0724 05/31/20 0457 06/01/20 0509  NA 146* 150* 153* 162* 161*  K 4.1 3.7 3.7 3.5 3.6  CL 107 114* 116* 121* 122*  CO2 27 24 27 28 25   GLUCOSE 113* 116* 128* 133* 121*  BUN 41* 44* 46* 58* 59*  CREATININE 1.59* 1.40* 1.54* 2.01* 2.09*  CALCIUM 9.4 9.1 9.4 9.4 9.4    Recent Labs  Lab 05/29/20 1648  AMMONIA 11   CBC: Recent Labs  Lab 05/26/20 0548 05/29/20 0432 05/31/20 0457  WBC 6.2 9.5 11.3*  NEUTROABS 4.7  --   --   HGB 13.0 13.7 13.7  HCT 40.7 42.7 43.9  MCV 92.5 91.8 93.0  PLT 221 298 367     Recent Results (from the past 240 hour(s))  Urine Culture     Status: None   Collection Time: 05/22/20 11:59 PM   Specimen: Urine, Random  Result Value Ref Range Status   Specimen Description   Final    URINE, RANDOM Performed at [facility], [address] [place], [place] [address]    Special Requests   Final    Normal Performed at [facility], [address] [place], [place] [address]    Culture   Final    NO GROWTH Performed at [facility], [place] [address] [place], [place] [address]    Report Status 05/24/2020 FINAL  Final      Scheduled Meds: . vitamin C  500 mg Oral Daily  . aspirin EC  81 mg Oral Daily  . carbidopa-levodopa  0.5 tablet Oral TID WC  . cholecalciferol  5,000 Units Oral Daily  . donepezil  10 mg Oral QHS  . enoxaparin (LOVENOX) injection  30 mg Subcutaneous Q24H  . famotidine  20 mg Oral Daily  . levothyroxine  50 mcg Oral Q0600  . melatonin  10 mg Oral q1800  . metoprolol tartrate  25 mg Oral TID  . mirtazapine  45 mg Oral q1800  . multivitamin with minerals  1 tablet Oral Daily  . pantoprazole  40 mg Oral Daily  . traZODone  50 mg Oral q1800  . vitamin B-12  5,000 mcg Oral Daily  . zinc sulfate  220 mg Oral  Daily   Continuous Infusions: . dextrose 5 % with KCl 20 mEq / L 20 mEq (06/01/20 1237)    Assessment/Plan:  1. Acute delirium with history of Lewy body dementia.  Patient's mental status a little bit better today than yesterday.  Continue nighttime medications given earlier because he normally goes to bed at 7 PM. 2. Hypernatremia.  Sodium still 161 today.  Changed IV fluids over to D5W with KCl today. 3. Acute kidney injury on chronic kidney disease stage IIIb.  Creatinine still elevated.  Changing IV fluids over to D5W.  Creatinine 2.09 today.  Creatinine was 1.54 on 05/28/2020. 4. Atrial fibrillation with rapid ventricular response.  Heart rates with activity does go up into the 150s.  Continue metoprolol 25 mg three times a day.  Holding off on anticoagulation secondary to delirium and fall risk at this point. 5. COVID-19 infection.  Patient completed 5 days of remdesivir.  Hold off on any steroids at this point secondary to acute delirium. 6. Hypothyroidism unspecified.  Continue levothyroxine 7. Parkinson's disease on Sinemet 8. Appreciate palliative care consultation. 9. Weakness.  Physical therapy still recommends rehab.   Code Status:     Code Status Orders  (From admission, onward)         Start     Ordered   05/30/20 1321  Limited resuscitation (code)  Continuous       Question Answer Comment  In the event of cardiac or respiratory ARREST: Initiate Code Blue, Call Rapid Response Yes   In the event of cardiac or respiratory ARREST: Perform CPR Yes   In the event of cardiac or respiratory ARREST: Perform Intubation/Mechanical Ventilation No   In the event of cardiac or respiratory ARREST: Use NIPPV/BiPAp only if indicated Yes   In the event of cardiac or respiratory ARREST: Administer ACLS medications if indicated Yes   In the event of cardiac or respiratory ARREST: Perform Defibrillation or Cardioversion if indicated Yes      05/30/20 1320        Code Status History     Date Active Date Inactive Code Status Order ID Comments User Context   05/23/2020 0204 05/30/2020 1320 Full Code [id number]  Mansy, Arvella Merles, MD ED   Advance Care Planning Activity     Family Communication: Spoke with wife at the bedside Disposition Plan: Status is: Inpatient  Dispo: The patient is from: Home  Anticipated d/c is to: Rehab              Anticipated d/c date is: No rehab beds until next week.  This will give me time to control atrial fibrillation get hypernatremia and kidney function better              Patient currently being treated for acute kidney injury, hypernatremia and atrial fibrillation with rapid ventricular response.  Time spent: 27 minutes.  [place]  [facility] [employer]

## 2020-06-01 NOTE — TOC Progression Note (Signed)
Transition of Care [facility]) - Progression Note    Patient Details  Name: Blake Bates MRN: [id number] Date of Birth: 1940-06-21  Transition of [facility]) CM/SW Contact  Shelbie Hutching, RN Phone Number: 06/01/2020, 2:50 PM  Clinical Narrative:    Patient is not medically stable for discharge at this time.  TOC continues to look for additional bed offers.  [facility] can accept on 1/25.    Expected Discharge Plan: [place] Barriers to Discharge: Continued Medical Work up  Expected Discharge Plan and Services Expected Discharge Plan: [place]   Discharge Planning Services: CM Consult Post Acute Care Choice: [place] Living arrangements for the past 2 months: Single Family Home                                       Social Determinants of Health (SDOH) Interventions    Readmission Risk Interventions No flowsheet data found.

## 2020-06-01 NOTE — Progress Notes (Signed)
Occupational Therapy Treatment Patient Details Name: Blake Bates MRN: [id number] DOB: Feb 19, 1941 Today's Date: 06/01/2020    History of present illness "Blake Bates" Blake Bates is a 89 yoM who comes to [facility] on 05/22/20 c inability to walk, acute on chronic AMS. Patient tested (+) COVID19. PMH: Lewy Bodies, PD, baseline AMB difficulty with RW, HLD, hypoTSH, osteoporosis. Pt febrile in ED. CXR revealing of new mid thoracic compression fracture 40% height loss, chronicity unknown.   OT comments  Upon entering the room, pt seated in recliner chair with wife present in the room. She reports, " He has been sitting up for 2 hours now and I think he is tired and needs to get back into bed". Pt unable to follow commands this session. Pt is very fearful with mobility tasks and is observed to be pushing self to the R in chair. OT positioned recliner chair for transfer with ultimately needing to perform total A bobath technique for safety. Pt was unable to follow commands to release chair arm rest during transfer which is why bobath technique then used. Pt needing manual facilitation for anterior weight shift. Total A for trunk and B LEs to return to bed. Pt then repositioned in bed with +2 assistance for repositioning. Pt closing eyes at this time and appeared to be fast asleep. Caregiver remained within the room. All needs within reach. Pt will continue to need SNF at discharge to address functional deficits.   Follow Up Recommendations  SNF;Supervision/Assistance - 24 hour    Equipment Recommendations  Other (comment) (defer to next venue of care)       Precautions / Restrictions Precautions Precautions: Fall Restrictions Weight Bearing Restrictions: No       Mobility Bed Mobility Overal bed mobility: Needs Assistance Bed Mobility: Sit to Supine     Supine to sit: +2 for safety/equipment;Max assist Sit to supine: Total assist   General bed mobility comments: total A with pt unable to initiate  or assist with task.  Transfers Overall transfer level: Needs assistance Equipment used: None Transfers: Squat Pivot Transfers Sit to Stand: Max assist;+2 safety/equipment;From elevated surface Stand pivot transfers: +2 safety/equipment;From elevated surface;Max assist Squat pivot transfers: Total assist     General transfer comment: Pt pushing to the R in chair and unable to release arm rest during transfer. Bo bath technique utilized to keep pt's arms inside for safety.    Balance Overall balance assessment: Needs assistance Sitting-balance support: Feet supported;Bilateral upper extremity supported Sitting balance-Leahy Scale: Poor     Standing balance support: During functional activity;Bilateral upper extremity supported Standing balance-Leahy Scale: Zero                             ADL either performed or assessed with clinical judgement        Vision Baseline Vision/History: Wears glasses Wears Glasses: At all times Patient Visual Report: No change from baseline            Cognition Arousal/Alertness: Lethargic Behavior During Therapy: Anxious;Restless Overall Cognitive Status: History of cognitive impairments - at baseline                                 General Comments: unable to follow verbal commands and pushing to the R during session                   Pertinent Vitals/ Pain  Pain Assessment: Faces Faces Pain Scale: No hurt         Frequency  Min 1X/week        Progress Toward Goals  OT Goals(current goals can now be found in the care plan section)  Progress towards OT goals: Progressing toward goals  Acute Rehab OT Goals Patient Stated Goal: none stated OT Goal Formulation: With family Time For Goal Achievement: 06/06/20 Potential to Achieve Goals: [place] Discharge plan remains appropriate    Co-evaluation        PT goals addressed during session: Mobility/safety with  mobility;Balance;Proper use of DME;Strengthening/ROM        AM-PAC OT "6 Clicks" Daily Activity     Outcome Measure   Help from another person eating meals?: Total Help from another person taking care of personal grooming?: Total Help from another person toileting, which includes using toliet, bedpan, or urinal?: Total Help from another person bathing (including washing, rinsing, drying)?: Total Help from another person to put on and taking off regular upper body clothing?: Total Help from another person to put on and taking off regular lower body clothing?: Total 6 Click Score: 6    End of Session    OT Visit Diagnosis: Unsteadiness on feet (R26.81);Muscle weakness (generalized) (M62.81)   Activity Tolerance Patient limited by fatigue   Patient Left in bed;with call bell/phone within reach;with family/visitor present   Nurse Communication Mobility status        Time: 1610-9604 OT Time Calculation (min): 24 min  Charges: OT General Charges $OT Visit: 1 Visit OT Treatments $Therapeutic Activity: 23-37 mins  Darleen Crocker, MS, OTR/L , CBIS ascom [phone]  06/01/20, 2:40 PM

## 2020-06-01 NOTE — Progress Notes (Signed)
Blake Bates with the lab called with a critical Sodium of 161. Gave result to [facility] the patients nurse.

## 2020-06-02 DIAGNOSIS — I480 Paroxysmal atrial fibrillation: Secondary | ICD-10-CM

## 2020-06-02 DIAGNOSIS — G3183 Dementia with Lewy bodies: Secondary | ICD-10-CM | POA: Diagnosis not present

## 2020-06-02 DIAGNOSIS — N179 Acute kidney failure, unspecified: Secondary | ICD-10-CM | POA: Diagnosis not present

## 2020-06-02 DIAGNOSIS — E870 Hyperosmolality and hypernatremia: Secondary | ICD-10-CM | POA: Diagnosis not present

## 2020-06-02 LAB — BASIC METABOLIC PANEL
Anion gap: 12 (ref 5–15)
BUN: 50 mg/dL — ABNORMAL HIGH (ref 8–23)
CO2: 22 mmol/L (ref 22–32)
Calcium: 8.9 mg/dL (ref 8.9–10.3)
Chloride: 124 mmol/L — ABNORMAL HIGH (ref 98–111)
Creatinine, Ser: 2.03 mg/dL — ABNORMAL HIGH (ref 0.61–1.24)
GFR, Estimated: 33 mL/min — ABNORMAL LOW (ref >60)
Glucose, Bld: 129 mg/dL — ABNORMAL HIGH (ref 70–99)
Potassium: 3.6 mmol/L (ref 3.5–5.1)
Sodium: 158 mmol/L — ABNORMAL HIGH (ref 135–145)

## 2020-06-02 LAB — C-REACTIVE PROTEIN: CRP: 5.1 mg/dL — ABNORMAL HIGH (ref <1.0)

## 2020-06-02 MED ORDER — BISACODYL 10 MG RE SUPP
10.0000 mg | Freq: Once | RECTAL | Status: AC
  Administered 2020-06-02: 10 mg via RECTAL
  Filled 2020-06-02: qty 1

## 2020-06-02 MED ORDER — POTASSIUM CL IN DEXTROSE 5% 20 MEQ/L IV SOLN
20.0000 meq | INTRAVENOUS | Status: DC
  Administered 2020-06-02 – 2020-06-04 (×3): 20 meq via INTRAVENOUS
  Filled 2020-06-02 (×8): qty 1000

## 2020-06-02 MED ORDER — METOPROLOL TARTRATE 25 MG PO TABS
25.0000 mg | ORAL_TABLET | Freq: Two times a day (BID) | ORAL | Status: DC
  Administered 2020-06-02 – 2020-06-04 (×5): 25 mg via ORAL
  Filled 2020-06-02 (×5): qty 1

## 2020-06-02 MED ORDER — POTASSIUM CL IN DEXTROSE 5% 20 MEQ/L IV SOLN
20.0000 meq | INTRAVENOUS | Status: DC
  Filled 2020-06-02 (×2): qty 1000

## 2020-06-02 NOTE — Progress Notes (Signed)
Patient ID: Blake Bates, male   DOB: [birth year], 80 y.o.   MRN: [id number] [facility] PROGRESS NOTE  Blake Bates [id number]:[id number] DOB: 12-Dec-1940 DOA: 05/22/2020 PCP: [name], DO  HPI/Subjective: Patient seen this morning and he was curled up in fetal position with one of his legs between the bed rails.  I had asked the nursing staff to straighten him out in the bed.  Patient was able to squeeze my hand but not able to talk very well.  Objective: Vitals:   06/02/20 1152 06/02/20 1202  BP: (!) 118/20 106/80  Pulse: 79 77  Resp: 16   Temp: 98.9 F (37.2 C)   SpO2: 100%     Intake/Output Summary (Last 24 hours) at 06/02/2020 1450 Last data filed at 06/02/2020 1208 Gross per 24 hour  Intake 100 ml  Output 950 ml  Net -850 ml   Filed Weights   05/22/20 1802  Weight: 56.7 kg    ROS: Review of Systems  Unable to perform ROS: Acuity of condition   Exam: Physical Exam HENT:     Head: Normocephalic.     Mouth/Throat:     Pharynx: No oropharyngeal exudate.  Eyes:     General: Lids are normal.     Conjunctiva/sclera: Conjunctivae normal.  Cardiovascular:     Rate and Rhythm: Normal rate. Rhythm irregularly irregular.     Heart sounds: Normal heart sounds, S1 normal and S2 normal.  Pulmonary:     Breath sounds: Examination of the right-lower field reveals decreased breath sounds. Examination of the left-lower field reveals decreased breath sounds. Decreased breath sounds present. No wheezing, rhonchi or rales.  Abdominal:     Palpations: Abdomen is soft.     Tenderness: There is no abdominal tenderness.  Musculoskeletal:     Right lower leg: No swelling.     Left lower leg: No swelling.  Skin:    General: Skin is warm.     Findings: No rash.  Neurological:     Mental Status: He is lethargic.     Comments: Patient able to squeeze my hands today.       Data Reviewed: Basic Metabolic Panel: Recent Labs  Lab 05/27/20 0528  05/28/20 0724 05/31/20 0457 06/01/20 0509 06/02/20 0516  NA 150* 153* 162* 161* 158*  K 3.7 3.7 3.5 3.6 3.6  CL 114* 116* 121* 122* 124*  CO2 24 27 28 25 22   GLUCOSE 116* 128* 133* 121* 129*  BUN 44* 46* 58* 59* 50*  CREATININE 1.40* 1.54* 2.01* 2.09* 2.03*  CALCIUM 9.1 9.4 9.4 9.4 8.9    Recent Labs  Lab 05/29/20 1648  AMMONIA 11   CBC: Recent Labs  Lab 05/29/20 0432 05/31/20 0457  WBC 9.5 11.3*  HGB 13.7 13.7  HCT 42.7 43.9  MCV 91.8 93.0  PLT 298 367    Scheduled Meds: . vitamin C  500 mg Oral Daily  . aspirin EC  81 mg Oral Daily  . carbidopa-levodopa  0.5 tablet Oral TID WC  . cholecalciferol  5,000 Units Oral Daily  . donepezil  10 mg Oral QHS  . enoxaparin (LOVENOX) injection  30 mg Subcutaneous Q24H  . famotidine  20 mg Oral Daily  . levothyroxine  50 mcg Oral Q0600  . melatonin  10 mg Oral q1800  . metoprolol tartrate  25 mg Oral BID  . mirtazapine  45 mg Oral q1800  . multivitamin with minerals  1 tablet Oral Daily  . pantoprazole  40 mg Oral Daily  . traZODone  50 mg Oral q1800  . vitamin B-12  5,000 mcg Oral Daily  . zinc sulfate  220 mg Oral Daily   Continuous Infusions: . dextrose 5 % with KCl 20 mEq / L      Assessment/Plan:  1. Acute delirium with history of Lewy body dementia.  Patient also has COVID infection.  Patient's mental status worse today than yesterday.  Continue giving the nighttime medications early because he goes to bed at 7 PM. 2. Hypernatremia.  Sodium still elevated at 158.  Continue D5W with KCl increased rate to 112 mL/h.  Recheck labs tomorrow morning.  With patient's mental status worsening and not eating hypernatremia will likely recur once we stop IV fluids.  Hypernatremia is a poor prognostic sign. 3. Acute kidney injury on chronic kidney disease stage IIIb.  Creatinine still elevated today at 2.03.  Continue IV fluids. 4. Paroxysmal atrial fibrillation..  With rapid ventricular response.  Continue metoprolol 25 mg  twice a day.  Holding off on anticoagulation secondary to delirium and fall risk at this point. 5. Hypothyroidism unspecified on levothyroxine 6. Parkinson's disease on Sinemet 7. Weakness.  Physical therapy recommends rehab 8. Dysphagia diet 9. Appreciate palliative care consultation 10. Discussed CODE STATUS again with wife.  Patient is a DO NOT INTUBATE.  She will think about CODE STATUS tomorrow.  She will speak with family.  Mentioned that if he does not improve with his mental status and eat more he may be a candidate for hospice.        Code Status:     Code Status Orders  (From admission, onward)         Start     Ordered   05/30/20 1321  Limited resuscitation (code)  Continuous       Question Answer Comment  In the event of cardiac or respiratory ARREST: Initiate Code Blue, Call Rapid Response Yes   In the event of cardiac or respiratory ARREST: Perform CPR Yes   In the event of cardiac or respiratory ARREST: Perform Intubation/Mechanical Ventilation No   In the event of cardiac or respiratory ARREST: Use NIPPV/BiPAp only if indicated Yes   In the event of cardiac or respiratory ARREST: Administer ACLS medications if indicated Yes   In the event of cardiac or respiratory ARREST: Perform Defibrillation or Cardioversion if indicated Yes      05/30/20 1320        Code Status History    Date Active Date Inactive Code Status Order ID Comments User Context   05/23/2020 0204 05/30/2020 1320 Full Code [id number]  Mansy, Arvella Merles, MD ED   Advance Care Planning Activity     Family Communication: Spoke with patient's wife on the phone Disposition Plan: Status is: Inpatient  Dispo: The patient is from: Home              Anticipated d/c is to: Not sure if he will be a candidate for rehab at this point              Anticipated d/c date is: We will need to be here until sodium improves.              Patient currently with poor appetite and decreased mental status and hypernatremia.   These are all poor prognostic signs.  May end up being a candidate for hospice if does not improve.   Difficult to place patient yet secondary to COVID.  Does  not have a bed available for rehab until next week.  Time spent: 27 minutes  [place]

## 2020-06-03 DIAGNOSIS — G3183 Dementia with Lewy bodies: Secondary | ICD-10-CM | POA: Diagnosis not present

## 2020-06-03 DIAGNOSIS — I480 Paroxysmal atrial fibrillation: Secondary | ICD-10-CM | POA: Diagnosis not present

## 2020-06-03 DIAGNOSIS — Z66 Do not resuscitate: Secondary | ICD-10-CM

## 2020-06-03 DIAGNOSIS — E870 Hyperosmolality and hypernatremia: Secondary | ICD-10-CM | POA: Diagnosis not present

## 2020-06-03 DIAGNOSIS — N179 Acute kidney failure, unspecified: Secondary | ICD-10-CM | POA: Diagnosis not present

## 2020-06-03 LAB — BASIC METABOLIC PANEL
Anion gap: 9 (ref 5–15)
BUN: 34 mg/dL — ABNORMAL HIGH (ref 8–23)
CO2: 24 mmol/L (ref 22–32)
Calcium: 8.6 mg/dL — ABNORMAL LOW (ref 8.9–10.3)
Chloride: 118 mmol/L — ABNORMAL HIGH (ref 98–111)
Creatinine, Ser: 1.73 mg/dL — ABNORMAL HIGH (ref 0.61–1.24)
GFR, Estimated: 39 mL/min — ABNORMAL LOW (ref >60)
Glucose, Bld: 113 mg/dL — ABNORMAL HIGH (ref 70–99)
Potassium: 3.8 mmol/L (ref 3.5–5.1)
Sodium: 151 mmol/L — ABNORMAL HIGH (ref 135–145)

## 2020-06-03 NOTE — Progress Notes (Signed)
Speech Language Pathology Treatment: Dysphagia  Patient Details Name: Blake Bates MRN: [id number] DOB: 1941/01/23 Today's Date: 06/03/2020 Time: 1310-1400 SLP Time Calculation (min) (ACUTE ONLY): 50 min  Assessment / Plan / Recommendation Clinical Impression  Pt seen today for ongoing assessment of swallowing; toleration of diet and education w/ pt/Wife. Pt appears to be declined in status from previously; he is more drowsy/lethargic per the Wife. He continues to need Max assist w/ all tasks; baseline Lewy body Dementia. Wife stated he has been "sleeping mostly". NSG reported pt has been taking only bites/sips in recent days. Pt appears to present w/ oropharyngeal phase dysphagia in light of Significantly declined Cognitive status; Lewy-body Dementia w/ Parkinson's Dis.(has Not been taking Sinemet at home)per Wife's report but was recently started post admit. These factors impact his overall awareness/engagement and safety during po tasks which increases risk for aspiration, choking. Pt has risk for aspiration as well as concern for meeting nutritional needs fully, safely.Pt has initiated Sinemet which may be helpful.When following aspiration precautions and given feeding assistance of modified consistencies, pthas been able to tolerate bites and sips of Nectar liquids of a Dysphagia diet. He requires Mod-Maxtactile/verbal/ visual cues for orientation to bolus presentation, and during feeding support. During this session, he required Max verbal/tactile cues to engage in swallowing a TSP of puree w/ crushed med -- significantly decreased oral awareness and oral holding noted. A much delayed swallow response(~2 mins) followed by delayed congested coughing was noted. Suspect reduced swallow function and effectiveness w/ high likelihood of pharyngeal residue which likely is penetrated and/or aspirated. Discussed w/ NSG and Wife to hold any po's including meds if this drowsy/lethargic and/or just  not responding orally to boluses or giving a pharyngeal swallow to the bolus.  D/t pt's declined Cognitive status, his risk for aspiration,and presentation w/ po's this session,recommend cautiously remaining on a Dysphagia level 1(puree foods) diet w/Nectarliquids via TSP w/ discussion w/ MD as a Pleasure diet; strictaspiration precautions; reduce Distractions during meals and only give po's when pt is calm and awake to participate; Pills Crushed in Puree as able for safer swallowingand conservation of energy. Support w/ feeding at meals -- check for oral clearing during/post intake. Recommend follow w/ Palliative Care; recent discussion of Hospice per Wife. Largely suspect that pt's Dementia and impact of illness could continue to hamper oral intake and upgrade of diet. Precautions posted at room. MD/NSG and Wife updated, agreed. Mucheducation w/ Wife on recommendations of diet for Pleasure; strategies during po presentation; aspiration precautions; positioning upright; reducing distractions during po's; Pills Crushed in puree. Checking for oral clearing and swallowing.Discussed dysphagia and risk for Pulmonary impact. Wife agreed.       HPI HPI: Pt is a 80 y.o. Caucasian male with a known history of asthma, dyslipidemia, hypothyroidism, Lewy body Dementia and Parkinson's disease, who presented to the emergency room with acute onset of altered mental status with delirium and confusion that started today.  He is usually able to ambulate with a walker at his baseline and today he was feeling so weak he could not walk.  The patient has been vaccinated against [id number] and had his booster shot on 02/07/2020.  He has been having cough productive of clear sputum and rhinorrhea without wheezing or dyspnea.  No nausea or vomiting or diarrhea.  Pt is followed by Chronic Care Management; has had OT/PT and aide in the home for support.  He has a much reduced oral diet of honey nut cheerios w/ attempts at  Ensure  per report.  Weight loss.  Wife described Significant episodes of Phlegm pt expectorates in the night and morning/day.  No dx'd Reflux/GERD; not on PPI at home.   Covid+.      SLP Plan  Discharge SLP treatment due to (comment) (pt is unable to participate in therapy at this time)       Recommendations  Diet recommendations: Dysphagia 1 (puree);Nectar-thick liquid (if awake/alert and able to swallow, participate) Liquids provided via: Teaspoon Medication Administration: Crushed with puree Supervision: Staff to assist with self feeding;Full supervision/cueing for compensatory strategies Compensations: Minimize environmental distractions;Slow rate;Small sips/bites;Lingual sweep for clearance of pocketing;Multiple dry swallows after each bite/sip;Follow solids with liquid Postural Changes and/or Swallow Maneuvers: Seated upright 90 degrees;Upright 30-60 min after meal                General recommendations:  (Pallative care/Hospice consult for [place]) Oral Care Recommendations: Oral care QID;Oral care before and after PO;Staff/trained caregiver to provide oral care Follow up Recommendations: None SLP Visit Diagnosis: Dysphagia, oropharyngeal phase (R13.12) (LB Dementia) Plan: Discharge SLP treatment due to (comment) (pt is unable to participate in therapy at this time)       [place], [place], [place] Pathologist Rehab Services [phone] [facility] 06/03/2020, 2:52 PM

## 2020-06-03 NOTE — TOC Progression Note (Addendum)
Transition of Care [facility]) - Initial/Assessment Note    Patient Details  Name: Blake Bates MRN: [id number] Date of Birth: 1940-05-18  Transition of Care [facility]) CM/SW Contact:    Ova Freshwater Phone Number: [phone] 06/03/2020, 11:25 AM  Clinical Narrative:                  Family would like patient to return home with hospice.  CSW spoke with patient's Ziemann,Lindia (Spouse)  [phone] and explained process and possible time line. Ms. Kittelson stated they will need a hospital bed in the house.  CSW stated I would reach out to [facility] for referral and they would contact Ms. Pal.  Ms.Flow stated they could also contact her daughter Berna Spare [phone].  CSW called and left voicemail for Jackelyn Poling RN [phone] at [facility] with request.  CSW updated Ms. [place] Caster.  Expected Discharge Plan: Skilled Nursing Facility Barriers to Discharge: Continued Medical Work up   Patient Goals and CMS Choice Patient states their goals for this hospitalization and ongoing recovery are:: Wife would like for the patient to go to Short term rehab to get stronger CMS Medicare.gov Compare Post Acute Care list provided to:: Patient Choice offered to / list presented to : Patient  Expected Discharge Plan and Services Expected Discharge Plan: [place]   Discharge Planning Services: CM Consult Post Acute Care Choice: [place] Living arrangements for the past 2 months: Single Family Home                                      Prior Living Arrangements/Services Living arrangements for the past 2 months: Single Family Home Lives with:: Spouse Patient language and need for interpreter reviewed:: Yes Do you feel safe going back to the place where you live?: Yes      Need for Family Participation in Patient Care: Yes (Comment) (COVID, dementia) Care giver support system in place?: Yes (comment) (wife) Current home services:  DME,Homehealth aide (rolling walker, hired Teaching laboratory technician 2 x per week) Criminal Activity/Legal Involvement Pertinent to Current Situation/Hospitalization: No - Comment as needed  Activities of Daily Living      Permission Sought/Granted Permission sought to share information with : Case Manager,Family Chief Financial Officer Permission granted to share information with : Yes, Verbal Permission Granted  Share Information with NAME: [name]  Permission granted to share info w AGENCY: SNF's  Permission granted to share info w Relationship: wife     Emotional Assessment       Orientation: : Oriented to Self Alcohol / Substance Use: Not Applicable Psych Involvement: No (comment)  Admission diagnosis:  Dehydration [E86.0] Delirium [R41.0] Weakness [R53.1] Parkinsonian features [R25.9] Unable to ambulate [R26.2] Chronic kidney disease, unspecified CKD stage [N18.9] COVID-19 [U07.1] Patient Active Problem List   Diagnosis Date Noted  . AF (paroxysmal atrial fibrillation) ([place])   . Hypernatremia   . Acute kidney injury superimposed on CKD ([place])   . Atrial fibrillation with RVR ([place])   . Weakness   . Dehydration   . Palliative care by specialist   . Goals of care, counseling/discussion   . Delirium   . Parkinson's disease ([place])   . COVID-19 05/23/2020  . Normocytic anemia 01/31/2020  . Recurrent falls 10/31/2019  . History of CVA (cerebrovascular accident) 10/31/2019  . Generalized muscle weakness 10/31/2019  . Oropharyngeal dysphagia 06/21/2019  . Moderate protein-calorie malnutrition ([place]) 06/21/2019  .  Balance disorder 06/21/2019  . Left homonymous hemianopsia 06/21/2019  . Pain due to onychomycosis of toenails of both feet 01/03/2019  . Lewy body dementia with behavioral disturbance ([place]) 07/21/2017  . Elevated PSA 12/09/2016  . Vitamin D deficiency 12/09/2016  . Chronic pain of right knee 11/04/2016  . Hyperlipidemia 11/04/2016  . Chronic kidney disease  11/04/2016  . Osteoarthritis of multiple joints 11/03/2016  . Chronic right hip pain 11/03/2016  . 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 Hypothyroidism 11/03/2014  . Asthma 11/03/2014  . Osteoporosis 11/03/2014  . Skin lesion of chest wall 11/03/2014  . Trigger finger of both hands 11/03/2014   PCP:  [name], DO Pharmacy:   [facility] [place], [address] AT [address] [place] [address] Phone: [phone] Fax: [phone]     Social Determinants of Health (SDOH) Interventions    Readmission Risk Interventions No flowsheet data found.

## 2020-06-03 NOTE — Progress Notes (Signed)
Patient ID: Blake Bates, male   DOB: 11-24-1940, 80 y.o.   MRN: [id number] [facility] PROGRESS NOTE  Blake Bates EGB:[id number] DOB: 06-Mar-1941 DOA: 05/22/2020 PCP: [name], DO  HPI/Subjective: Patient awake at times but falls asleep very easily.  Patient unable to give much history at this time.  Patient was admitted with confusion and found to have COVID-19 infection.  Objective: Vitals:   06/03/20 0808 06/03/20 1133  BP: (!) 146/124 102/69  Pulse: 78 79  Resp: 20 20  Temp: 98.3 F (36.8 C) (!) 97.4 F (36.3 C)  SpO2: 100% 98%    Intake/Output Summary (Last 24 hours) at 06/03/2020 1504 Last data filed at 06/03/2020 1304 Gross per 24 hour  Intake 2093.54 ml  Output 1000 ml  Net 1093.54 ml   Filed Weights   05/22/20 1802  Weight: 56.7 kg    ROS: Review of Systems  Unable to perform ROS: Acuity of condition   Exam: Physical Exam HENT:     Head: Normocephalic.  Eyes:     General: Lids are normal.     Conjunctiva/sclera: Conjunctivae normal.  Cardiovascular:     Rate and Rhythm: Normal rate. Rhythm irregularly irregular.     Heart sounds: Normal heart sounds, S1 normal and S2 normal.  Pulmonary:     Breath sounds: Examination of the right-lower field reveals decreased breath sounds. Examination of the left-lower field reveals decreased breath sounds. Decreased breath sounds present. No wheezing, rhonchi or rales.  Abdominal:     Palpations: Abdomen is soft.     Tenderness: There is no abdominal tenderness.  Musculoskeletal:     Right lower leg: No swelling.     Left lower leg: No swelling.  Skin:    General: Skin is warm.     Findings: No rash.  Neurological:     Mental Status: He is lethargic.     Comments: Patient did move his legs when I touched his feet.       Data Reviewed: Basic Metabolic Panel: Recent Labs  Lab 05/28/20 0724 05/31/20 0457 06/01/20 0509 06/02/20 0516 06/03/20 0642  NA 153* 162* 161* 158*  151*  K 3.7 3.5 3.6 3.6 3.8  CL 116* 121* 122* 124* 118*  CO2 27 28 25 22 24   GLUCOSE 128* 133* 121* 129* 113*  BUN 46* 58* 59* 50* 34*  CREATININE 1.54* 2.01* 2.09* 2.03* 1.73*  CALCIUM 9.4 9.4 9.4 8.9 8.6*    Recent Labs  Lab 05/29/20 1648  AMMONIA 11   CBC: Recent Labs  Lab 05/29/20 0432 05/31/20 0457  WBC 9.5 11.3*  HGB 13.7 13.7  HCT 42.7 43.9  MCV 91.8 93.0  PLT 298 367    Scheduled Meds: . carbidopa-levodopa  0.5 tablet Oral TID WC  . enoxaparin (LOVENOX) injection  30 mg Subcutaneous Q24H  . levothyroxine  50 mcg Oral Q0600  . melatonin  10 mg Oral q1800  . metoprolol tartrate  25 mg Oral BID  . mirtazapine  45 mg Oral q1800   Continuous Infusions: . dextrose 5 % with KCl 20 mEq / L 112 mL/hr at 06/03/20 1304    Assessment/Plan:  1. Acute delirium with history of Lewy body dementia.  Patient also has COVID-19 infection.  Case discussed with patient's wife at the bedside we will get rid of trazodone since he does not take that at home.  Continue his nighttime melatonin and Remeron.  Everything he is off the patient's mental status.  Not able  to eat very well yesterday and today. 2. Hypernatremia.  Sodium did come down to 151 with IV fluids at increased rate.  Recheck labs tomorrow morning 3. Acute kidney injury on chronic kidney disease stage IIIb.  Creatinine did come down from 2.03 yesterday down to 1.73 today. 4. Paroxysmal atrial fibrillation metoprolol if able to take.  No anticoagulation with delirium and fall risk. 5. Hypothyroidism unspecified on levothyroxine if able to take 6. Parkinson's disease on Sinemet if able to take 7. Weakness. 8. Dysphagia diet but high risk for aspiration 9. The patient's wife has confirmed DO NOT RESUSCITATE status with me today.  Order changed in the computer this morning.  She is also interested in hospice at home.  I messaged transitional care team to notify hospice.  Likely will need hospital bed and equipment at home  prior to any disposition.     Code Status:     Code Status Orders  (From admission, onward)         Start     Ordered   06/03/20 1056  Do not attempt resuscitation (DNR)  Continuous       Question Answer Comment  In the event of cardiac or respiratory ARREST Do not call a "code blue"   In the event of cardiac or respiratory ARREST Do not perform Intubation, CPR, defibrillation or ACLS   In the event of cardiac or respiratory ARREST Use medication by any route, position, wound care, and other measures to relive pain and suffering. May use oxygen, suction and manual treatment of airway obstruction as needed for comfort.   Comments nurse may pronounce      06/03/20 1055        Code Status History    Date Active Date Inactive Code Status Order ID Comments User Context   05/30/2020 1320 06/03/2020 1055 Partial Code [id number]  Loletha Grayer, MD Inpatient   05/23/2020 0204 05/30/2020 1320 Full Code [id number]  Mansy, Arvella Merles, MD ED   Advance Care Planning Activity     Family Communication: Spoke with patient's wife at the bedside Disposition Plan: Status is: Inpatient  Dispo: The patient is from: Home              Anticipated d/c is to: Home with hospice              Anticipated d/c date is: Potentially 06/04/2020 versus 06/05/2020 depending on how quickly we can set things up.              Patient currently still with acute delirium and poor oral intake   Time spent: 27 minutes  [place]

## 2020-06-03 NOTE — Progress Notes (Signed)
Mirtazapine and melatonin not given. Patient is sleepy, easily aroused, but high risk for aspiration.

## 2020-06-04 ENCOUNTER — Telehealth: Payer: Self-pay | Admitting: Family Medicine

## 2020-06-04 LAB — BASIC METABOLIC PANEL
Anion gap: 13 (ref 5–15)
BUN: 31 mg/dL — ABNORMAL HIGH (ref 8–23)
CO2: 23 mmol/L (ref 22–32)
Calcium: 8.5 mg/dL — ABNORMAL LOW (ref 8.9–10.3)
Chloride: 112 mmol/L — ABNORMAL HIGH (ref 98–111)
Creatinine, Ser: 1.56 mg/dL — ABNORMAL HIGH (ref 0.61–1.24)
GFR, Estimated: 45 mL/min — ABNORMAL LOW (ref >60)
Glucose, Bld: 131 mg/dL — ABNORMAL HIGH (ref 70–99)
Potassium: 3.8 mmol/L (ref 3.5–5.1)
Sodium: 148 mmol/L — ABNORMAL HIGH (ref 135–145)

## 2020-06-04 MED ORDER — MORPHINE SULFATE (CONCENTRATE) 10 MG/0.5ML PO SOLN
5.0000 mg | ORAL | Status: DC | PRN
Start: 2020-06-04 — End: 2020-06-04

## 2020-06-04 MED ORDER — ENOXAPARIN SODIUM 40 MG/0.4ML ~~LOC~~ SOLN: 40.0000 mg | SUBCUTANEOUS | Status: DC

## 2020-06-04 MED ORDER — RESOURCE THICKENUP CLEAR PO POWD: 1.0000 | Freq: Three times a day (TID) | ORAL | 0 refills | Status: AC

## 2020-06-04 MED ORDER — METOPROLOL TARTRATE 25 MG PO TABS: 12.5000 mg | ORAL_TABLET | Freq: Two times a day (BID) | ORAL | 0 refills | Status: AC

## 2020-06-04 MED ORDER — MORPHINE SULFATE (CONCENTRATE) 10 MG/0.5ML PO SOLN: 5.0000 mg | ORAL | 0 refills | Status: AC | PRN

## 2020-06-04 MED ORDER — METOPROLOL TARTRATE 25 MG PO TABS: 12.5000 mg | ORAL_TABLET | Freq: Two times a day (BID) | ORAL | Status: DC

## 2020-06-04 NOTE — Progress Notes (Addendum)
[place] [facility]) Hospital Liaison RN note:  Received request from Dr. Leslye Peer and Doran Clay, [facility] for hospice services at home after discharge. Chart and patient information under review by [facility] physician. Hospice eligibility was approved.  Spoke with spouse, [name] over the phone to initiate education related to hospice philosophy, services and to answer any questions. Lindia verbalized understanding and all questions were answered. The plan is to discharge home today by EMS once hospital bed in place.  DME needs discussed. Spouse requests a hospital bed and an over the bed table and O2 PRN.  Address has been verified and is correct on chart. Spouse is contact to arrange DME delivery.  Please send signed and completed DNR home with patient. Please provide prescriptions at discharge as needed to ensure ongoing symptom management.  [place] Liaison contact information provided to [place]. Above info shared with hospital care team.  Please call with any hospice related questions or concerns.  Thank you for the opportunity to participate in this patient's care.  Zandra Abts, RN [facility] Hospital Liaison [phone]

## 2020-06-04 NOTE — TOC Transition Note (Signed)
Transition of Care [facility]) - CM/SW Discharge Note   Patient Details  Name: Blake Bates MRN: [id number] Date of Birth: 07-31-1940  Transition of [facility]) CM/SW Contact:  Shelbie Hutching, RN Phone Number: 06/04/2020, 9:43 AM   Clinical Narrative:    Patient's wife would like to take patient home with hospice care.  Blake Bates [facility].  Kieth Brightly with [facility] given home with hospice referral.  Patient just needs a hospital bed delivered before discharge.  Kieth Brightly will order hospital bed and have delivered today.  Once bed has been delivered [facility] will arranged for EMS to transport patient home.    Final next level of care: Home w Hospice Care Barriers to Discharge: Barriers Resolved   Patient Goals and CMS Choice Patient states their goals for this hospitalization and ongoing recovery are:: Wife just wants to get patient home with hospice today CMS Medicare.gov Compare Post Acute Care list provided to:: Patient Represenative (must comment) Choice offered to / list presented to : Spouse  Discharge Placement                Patient to be transferred to facility by: Patient to be transfered home by EMS Name of family member notified: Blake Bates Patient and family notified of of transfer: 06/04/20  Discharge Plan and Services   Discharge Planning Services: CM Consult Post Acute Care Choice: [place]          DME Arranged: Hospital bed DME Agency: Other - Comment ([employer]) Date DME Agency Contacted: 06/04/20 Time DME Agency Contacted: [phone] Representative spoke with at DME Agency: [place] ([place]) Interventions     Readmission Risk Interventions No flowsheet data found.

## 2020-06-04 NOTE — TOC Progression Note (Signed)
Transition of Care [facility]) - Progression Note    Patient Details  Name: ABDULAHI SCHOR MRN: [id number] Date of Birth: 11/25/1940  Transition of Care [facility]) CM/SW Contact  Shelbie Hutching, RN Phone Number: 06/04/2020, 2:32 PM  Clinical Narrative:    All DME has been delivered to the patient's home.  RNCM has arranged EMS transport.  Patient is 2nd on the list for pick up.    Expected Discharge Plan: Skilled Nursing Facility Barriers to Discharge: Barriers Resolved  Expected Discharge Plan and Services Expected Discharge Plan: [place]   Discharge Planning Services: CM Consult Post Acute Care Choice: [place] Living arrangements for the past 2 months: Single Family Home Expected Discharge Date: 06/04/20               DME Arranged: Hospital bed DME Agency: Other - Comment ([place]) Date DME Agency Contacted: 06/04/20 Time DME Agency Contacted: [phone] Representative spoke with at DME Agency: [place] ([place]) Interventions    Readmission Risk Interventions No flowsheet data found.

## 2020-06-04 NOTE — Progress Notes (Signed)
PMT provider chart review. Wife has spoken with her children and decision made for DNR. She is planning to take [place] home with hospice support and is hopeful for discharge today. Answered questions. PMT contact information given. Updated care team.   [place], [place], [place] Palliative Medicine Team  Phone: [phone] Fax: [phone]

## 2020-06-04 NOTE — TOC Transition Note (Signed)
Transition of Care [facility]) - CM/SW Discharge Note   Patient Details  Name: Blake Bates MRN: [id number] Date of Birth: Sep 18, 1940  Transition of Care [facility]) CM/SW Contact:  Shelbie Hutching, RN Phone Number: 06/04/2020, 11:20 AM   Clinical Narrative:    Hospital bed to be delivered this afternoon between 2 and 3.  RNCM will arrange transport with [facility] for after 1530.     Final next level of care: Home w Hospice Care Barriers to Discharge: Barriers Resolved   Patient Goals and CMS Choice Patient states their goals for this hospitalization and ongoing recovery are:: Wife just wants to get patient home with hospice today CMS Medicare.gov Compare Post Acute Care list provided to:: Patient Represenative (must comment) Choice offered to / list presented to : Spouse  Discharge Placement                Patient to be transferred to facility by: Patient to be transfered home by EMS Name of family member notified: [name] Patient and family notified of of transfer: 06/04/20  Discharge Plan and Services   Discharge Planning Services: CM Consult Post Acute Care Choice: [place]          DME Arranged: Hospital bed DME Agency: Other - Comment ([place]) Date DME Agency Contacted: 06/04/20 Time DME Agency Contacted: [phone] Representative spoke with at DME Agency: [place] ([place]) Interventions     Readmission Risk Interventions No flowsheet data found.

## 2020-06-04 NOTE — Progress Notes (Signed)
PHARMACIST - PHYSICIAN COMMUNICATION  CONCERNING:  Enoxaparin (Lovenox) for DVT Prophylaxis    RECOMMENDATION: Patient was prescribed enoxaprin 30mg  q24 hours for VTE prophylaxis and CrCl <30  Filed Weights   05/22/20 1802  Weight: 56.7 kg (125 lb)    Body mass index is 17.43 kg/m.  Estimated Creatinine Clearance: 30.3 mL/min (A) (by C-G formula based on SCr of 1.56 mg/dL (H)).   Renal function has now improved.    Based on [place] patient  is candidate for enoxaparin 40mg  every 24 hours based on CrCl >28ml/min  DESCRIPTION: Pharmacy has adjusted enoxaparin dose per [facility] policy.  Patient is now receiving enoxaparin 40 mg every 24 hours   Pernell Dupre, PharmD, BCPS Clinical Pharmacist 06/04/2020 11:10 AM

## 2020-06-04 NOTE — Care Management Important Message (Signed)
Important Message  Patient Details  Name: Blake Bates MRN: [id number] Date of Birth: 1941/02/25   Medicare Important Message Given:  Yes     Shelbie Hutching, RN 06/04/2020, 11:19 AM

## 2020-06-04 NOTE — Telephone Encounter (Signed)
Crystal called again. I Called to office and spoke with Apolonio Schneiders she aviised that PCP would be the attending physician. Message was relayed to [place]. Please advise.

## 2020-06-04 NOTE — Telephone Encounter (Signed)
Home Health Verbal Orders - Caller/Agency: Gerald Leitz Number: [phone] Requesting OT/PT/Skilled Nursing/Social Work/Speech Therapy:  Frequency:   Wants to know if PCP will agree to being the attending MD for pt, and agree that the patient is terminally ill. Requesting call back

## 2020-06-04 NOTE — Discharge Summary (Signed)
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 Asthma   . Diverticulosis   . Full dentures   . Hyperlipidemia   . Hypothyroidism    s/p subtotal thyroidectomy  . Muscle spasms of lower extremity    Right upper leg  . Osteoporosis   . Rhinitis, allergic    pollens, mold, animal dander    HOSPITAL COURSE:   1.  Acute delirium with history of Lewy body dementia.  Patient also with COVID-19 infection which sometimes can cause a Covid encephalopathy which would make mental status worse..  Case discussed with patient's wife and she would like to get the patient home today.  The patient will go home with hospice.  Hospital bed will be delivered today.  Patient did answer a couple questions but went to sleep.  Not able to eat very much yesterday at all. 2.  Hypernatremia.  Patient sodium peaked at 162 on 05/31/2020 with IV fluid hydration and switching fluids over to D5W sodium did come down to 148 upon discharge home with hospice.   High risk for hypernatremia to occur again.  This is a poor prognostic sign. 3.  Acute kidney injury on chronic kidney disease stage IIIb.  Creatinine peaked on 06/01/2020 at 2.09.  With IV fluid hydration creatinine did come down to 1.56. 4.  Paroxysmal atrial fibrillation.  The patient was in rapid atrial fibrillation we need to start metoprolol.  No anticoagulation with delirium and fall risk.  This morning the patient actually converted to normal sinus rhythm.  Metoprolol decreased to half tablet twice a day 5.  Hypothyroidism unspecified on levothyroxine if able to take 6.  Parkinson disease on Sinemet if able to take 7.  Weakness 8.  Dysphagia.  High risk for aspiration.  Thickened liquids and pured diet recommended. 9.  DNR.  Patient will be discharged home with hospice today after hospital blood delivered.  Case discussed with patient's wife and if the patient does not eat he will pass away within 1 week.  Roxanol as needed if having trouble breathing or in any pain, it will be absorbed sublingually, if not swallowed.  If the patient is not able to take any of the medications that is okay also.  DISCHARGE CONDITIONS:   Guarded  CONSULTS OBTAINED:  Treatment Team:  Donnetta Simpers, MD  DRUG ALLERGIES:   Allergies  Allergen Reactions  . Mold Extract  [Trichophyton]   . Morphine Anxiety and Other (See Comments)    DISCHARGE MEDICATIONS:   Allergies as of 06/04/2020  Reactions   Mold Extract  [trichophyton]    Morphine Anxiety, Other (See Comments)      Medication List    STOP taking these medications   acetaminophen 650 MG CR tablet Commonly known as: TYLENOL   albuterol 108 (90 Base) MCG/ACT inhaler Commonly known as: VENTOLIN HFA   donepezil 10 MG tablet Commonly known as: ARICEPT   loratadine 10 MG tablet Commonly known as: CLARITIN   meloxicam 15 MG tablet Commonly known as: MOBIC   Vitamin B-12 5000 MCG Subl   VITAMIN D-3 PO     TAKE these  medications   carbidopa-levodopa 25-100 MG tablet Commonly known as: SINEMET IR Take 1/2 tablet three times a day with meals What changed:   how much to take  how to take this  when to take this  additional instructions   levothyroxine 50 MCG tablet Commonly known as: SYNTHROID TAKE 1 TABLET(50 MCG) BY MOUTH DAILY BEFORE BREAKFAST What changed: See the new instructions.   Melatonin 10 MG Tbcr Take 10 mg by mouth at bedtime.   metoprolol tartrate 25 MG tablet Commonly known as: LOPRESSOR Take 0.5 tablets (12.5 mg total) by mouth 2 (two) times daily.   mirtazapine 45 MG tablet Commonly known as: REMERON Take 1 tablet (45 mg total) by mouth at bedtime.   morphine CONCENTRATE 10 MG/0.5ML Soln concentrated solution Take 0.25 mLs (5 mg total) by mouth every 2 (two) hours as needed for severe pain or shortness of breath.   Resource ThickenUp Clear Powd Take 120 g by mouth in the morning, at noon, and at bedtime.            Durable Medical Equipment  (From admission, onward)         Start     Ordered   06/04/20 0905  For home use only DME Hospital bed  Once       Question Answer Comment  Length of Need Lifetime   The above medical condition requires: Patient requires the ability to reposition frequently   Head must be elevated greater than: 30 degrees   Bed type Semi-electric   Support Surface: Gel Overlay      06/04/20 0904           DISCHARGE INSTRUCTIONS:   Follow-up with hospice at home  If you experience worsening of your admission symptoms, develop shortness of breath, life threatening emergency, suicidal or homicidal thoughts you must seek medical attention immediately by calling 911 or calling your MD immediately  if symptoms less severe.  You Must read complete instructions/literature along with all the possible adverse reactions/side effects for all the Medicines you take and that have been prescribed to you. Take any new Medicines after you have  completely understood and accept all the possible adverse reactions/side effects.   Please note  You were cared for by a hospitalist during your hospital stay. If you have any questions about your discharge medications or the care you received while you were in the hospital after you are discharged, you can call the unit and asked to speak with the hospitalist on call if the hospitalist that took care of you is not available. Once you are discharged, your primary care physician will handle any further medical issues. Please note that NO REFILLS for any discharge medications will be authorized once you are discharged, as it is imperative that you return to your primary care physician (or establish a relationship with a primary care physician if you do not have  one) for your aftercare needs so that they can reassess your need for medications and monitor your lab values.    Today   CHIEF COMPLAINT:  Admitted with confusion  HISTORY OF PRESENT ILLNESS:  Blake Bates  is a 80 y.o. male admitted with confusion   VITAL SIGNS:  Blood pressure 110/83, pulse 77, temperature 97.8 F (36.6 C), resp. rate 16, height 5\' 11"  (1.803 m), weight 56.7 kg, SpO2 96 %.  I/O:    Intake/Output Summary (Last 24 hours) at 06/04/2020 1203 Last data filed at 06/04/2020 0851 Gross per 24 hour  Intake 1377.23 ml  Output 1100 ml  Net 277.23 ml    PHYSICAL EXAMINATION:  GENERAL:  80 y.o.-year-old patient lying in the bed with no acute distress.  EYES: No scleral icterus. HEENT: Head atraumatic, normocephalic.   LUNGS: Normal breath sounds bilaterally, no wheezing, rales,rhonchi or crepitation. No use of accessory muscles of respiration.  CARDIOVASCULAR: S1, S2 normal. No murmurs, rubs, or gallops.  ABDOMEN: Soft, non-tender, non-distended.  EXTREMITIES: No pedal edema.  NEUROLOGIC: Patient able to squeeze my hand today PSYCHIATRIC: The patient is lethargic.  Was able to awaken and answer few questions.[name]   SKIN: No obvious rash, lesion, or ulcer.   DATA REVIEW:   CBC Recent Labs  Lab 05/31/20 0457  WBC 11.3*  HGB 13.7  HCT 43.9  PLT 367    Chemistries  Recent Labs  Lab 06/04/20 0846  NA 148*  K 3.8  CL 112*  CO2 23  GLUCOSE 131*  BUN 31*  CREATININE 1.56*  CALCIUM 8.5*    Microbiology Results  Results for orders placed or performed during the hospital encounter of 05/22/20  Urine Culture     Status: None   Collection Time: 05/22/20 11:59 PM   Specimen: Urine, Random  Result Value Ref Range Status   Specimen Description   Final    URINE, RANDOM Performed at [facility], [address] [place], [place] [address]    Special Requests   Final    Normal Performed at [facility], [address] [place], [place] [address]    Culture   Final    NO GROWTH Performed at [facility], [place] [address], [place], [place] [address]    Report Status 05/24/2020 FINAL  Final     Management plans discussed with the the patient's wife and she is agreement.  CODE STATUS:     Code Status Orders  (From admission, onward)         Start     Ordered   06/03/20 1056  Do not attempt resuscitation (DNR)  Continuous       Question Answer Comment  In the event of cardiac or respiratory ARREST Do not call a "code blue"   In the event of cardiac or respiratory ARREST Do not perform Intubation, CPR, defibrillation or ACLS   In the event of cardiac or respiratory ARREST Use medication by any route, position, wound care, and other measures to relive pain and suffering. May use oxygen, suction and manual treatment of airway obstruction as needed for comfort.   Comments nurse may pronounce      06/03/20 1055        Code Status History    Date Active Date Inactive Code Status Order ID Comments User Context   05/30/2020 1320 06/03/2020 1055 Partial Code [id number]  Loletha Grayer, MD Inpatient   05/23/2020 0204 05/30/2020 1320 Full Code [id number]  Mansy, Jan  A, MD ED   Advance Care Planning Activity      TOTAL TIME TAKING CARE OF THIS PATIENT: 35 minutes.    Loletha Grayer M.D on 06/04/2020 at 12:03 PM  Between 7am to 6pm - Pager - [phone]  After 6pm go to [email or web address] - password [email or web address]  [facility]  CC: Primary care physician; [name], DO

## 2020-06-04 NOTE — Telephone Encounter (Signed)
I tried calling them again, I did not reach Crystal. I left them voicemail, yes I can be attending, I gave verbal for OT PT SKilled Nursing Social Work Speech  [name], [facility] 06/04/2020, 4:31 PM

## 2020-06-05 NOTE — Telephone Encounter (Signed)
Crystal calling to make sure you are aware this is for hospice care.  Pt will not be needing OT, PT or ST. This is for hospice care only

## 2020-06-05 NOTE — Telephone Encounter (Signed)
Ok. Yes that is fine. I can be attending for this patient for Hospice Care.  Typically they send [place] the form to sign. Typically as we have done with [facility] in past, if acute issues or something is needed urgently usually the Medical Director can assist with those orders, and we can help sign off on the treatment plan as the attending physician, if other routine orders needed I can help.  Also - can you please notify [facility] that I will be out of office on PAL starting Monday 1/31 through [place] 06/19/20 and return on Weds 2/9.  We will have limited coverage unfortunately during that time, therefore, may need help of medical director if urgent orders requested.  [name], [facility] 06/05/2020, 1:22 PM

## 2020-06-06 ENCOUNTER — Telehealth (INDEPENDENT_AMBULATORY_CARE_PROVIDER_SITE_OTHER): Payer: Medicare Other | Admitting: Family Medicine

## 2020-06-06 ENCOUNTER — Other Ambulatory Visit: Payer: Self-pay

## 2020-06-06 ENCOUNTER — Encounter: Payer: Self-pay | Admitting: Family Medicine

## 2020-06-06 VITALS — Wt 125.0 lb

## 2020-06-06 DIAGNOSIS — M8949 Other hypertrophic osteoarthropathy, multiple sites: Secondary | ICD-10-CM

## 2020-06-06 DIAGNOSIS — F0280 Dementia in other diseases classified elsewhere without behavioral disturbance: Secondary | ICD-10-CM

## 2020-06-06 DIAGNOSIS — N1832 Chronic kidney disease, stage 3b: Secondary | ICD-10-CM

## 2020-06-06 DIAGNOSIS — M159 Polyosteoarthritis, unspecified: Secondary | ICD-10-CM

## 2020-06-06 DIAGNOSIS — E034 Atrophy of thyroid (acquired): Secondary | ICD-10-CM | POA: Diagnosis not present

## 2020-06-06 DIAGNOSIS — F0281 Dementia in other diseases classified elsewhere with behavioral disturbance: Secondary | ICD-10-CM

## 2020-06-06 DIAGNOSIS — G3183 Dementia with Lewy bodies: Secondary | ICD-10-CM

## 2020-06-06 DIAGNOSIS — Z515 Encounter for palliative care: Secondary | ICD-10-CM

## 2020-06-06 NOTE — Progress Notes (Signed)
Virtual Visit via Telephone The purpose of this virtual visit is to provide medical care while limiting exposure to the novel coronavirus (COVID19) for both patient and office staff.  Consent was obtained for phone visit:  Yes.   Answered questions that patient had about telehealth interaction:  Yes.   I discussed the limitations, risks, security and privacy concerns of performing an evaluation and management service by telephone. I also discussed with the patient that there may be a patient responsible charge related to this service. The patient expressed understanding and agreed to proceed.  Patient Location: Home Provider Location: [facility] (Office)  Participants in virtual visit: - Patient: Rodarius "[name]" Frederico Hamman - Wife - Daniel Nones - CMA: Orinda Kenner, CMA - Provider: Dr Parks Ranger  ---------------------------------------------------------------------- Chief Complaint  Patient presents with  . Hospitalization Follow-up    S: Reviewed CMA documentation. I have called patient and gathered additional HPI as follows:  HOSPITAL FOLLOW-UP VISIT  Hospital/Location: [facility] Date of Admission: 05/22/20 Date of Discharge: 06/04/20 Transitions of care telephone call: Not completed.  Reason for Admission: Delirium with Dementia Primary (+Secondary) Diagnosis: Acute delirium with lew body dementia, hypernatremia, AoCKD IIIb  FOLLOW-UP - Hospital H&P and Discharge Summary have been reviewed - Patient presents today about 2 days after recent hospitalization. Brief summary of recent course, patient had symptoms of >1-2 weeks ago fatigue symptoms and difficulty with weakness, identified COVID19 positive in hospital ED, he was found to have hypernatremia, he was admitted and treated with remdesivir antiviral therapy. He was placed on IV fluids to correct the Sodium. Also creatinine elevated up to Cr 2.09, with improvement on IV hydration down to Cr 1.56. He was  have very poor PO intake and has been on dysphagia diet. Ultimately offered rehab vs home hospice, he was discharged with [facility].  - Today reports overall has done fairly well after discharge. Symptoms of mild fever at times 100.1 or 99.65F and treated Tylenol. He has taken in some increased PO with chicken soup broth fluids. He is breathing well and did not have respiratory compromise. - he was found to be in AFib prior to admission. RVR up to 130-134 at times. AFib has resolved and converted sinus. He is on Metoprolol and was decreased to half tab BID. His meds were reduced due to choking. - He was taking Melatonin and Mirtazapine in hospital, also they added Trazodone and he had difficulty waking up. He was too sedated. He is more sleepy now regardless.  Now initiating [facility].   I have reviewed the discharge medication list, and have reconciled the current and discharge medications today.   Past Medical History:  Diagnosis Date  . Asthma   . Diverticulosis   . Full dentures   . Hyperlipidemia   . Hypothyroidism    s/p subtotal thyroidectomy  . Muscle spasms of lower extremity    Right upper leg  . Osteoporosis   . Rhinitis, allergic    pollens, mold, animal dander   Social History   Tobacco Use  . Smoking status: Former Smoker    Packs/day: 0.00    Years: 60.00    Pack years: 0.00    Types: Pipe    Quit date: 05/12/2016    Years since quitting: 4.0  . Smokeless tobacco: Former Systems developer  . Tobacco comment: Self taper down from full pipe daily down to half to quarter pipe  Vaping Use  . Vaping Use: Never used  Substance Use  Topics  . Alcohol use: Not Currently    Comment: occasionally beer or wine   . Drug use: No    Current Outpatient Medications:  .  levothyroxine (SYNTHROID) 50 MCG tablet, TAKE 1 TABLET(50 MCG) BY MOUTH DAILY BEFORE BREAKFAST (Patient taking differently: Take 50 mcg by mouth daily before breakfast.), Disp: 90 tablet,  Rfl: 3 .  Maltodextrin-Xanthan Gum (RESOURCE THICKENUP CLEAR) POWD, Take 120 g by mouth in the morning, at noon, and at bedtime., Disp: 10800 g, Rfl: 0 .  Melatonin 10 MG TBCR, Take 10 mg by mouth at bedtime., Disp: , Rfl:  .  metoprolol tartrate (LOPRESSOR) 25 MG tablet, Take 0.5 tablets (12.5 mg total) by mouth 2 (two) times daily., Disp: 30 tablet, Rfl: 0 .  mirtazapine (REMERON) 45 MG tablet, Take 1 tablet (45 mg total) by mouth at bedtime., Disp: 30 tablet, Rfl: 11 .  Morphine Sulfate (MORPHINE CONCENTRATE) 10 MG/0.5ML SOLN concentrated solution, Take 0.25 mLs (5 mg total) by mouth every 2 (two) hours as needed for severe pain or shortness of breath., Disp: 30 mL, Rfl: 0  Depression screen [facility] 2/9 12/27/2019 09/19/2019 06/21/2019  Decreased Interest 0 0 0  Down, Depressed, Hopeless 0 0 0  PHQ - 2 Score 0 0 0  Altered sleeping - - -  Tired, decreased energy - - -  Change in appetite - - -  Feeling bad or failure about yourself  - - -  Trouble concentrating - - -  Moving slowly or fidgety/restless - - -  Suicidal thoughts - - -  PHQ-9 Score - - -  Difficult doing work/chores - - -    No flowsheet data found.  -------------------------------------------------------------------------- O: No physical exam performed due to remote telephone encounter.  Lab results reviewed.  Recent Results (from the past 2160 hour(s))  Protime-INR     Status: None   Collection Time: 05/22/20  6:05 PM  Result Value Ref Range   Prothrombin Time 11.8 11.4 - 15.2 seconds   INR 0.9 0.8 - 1.2    Comment: (NOTE) INR goal varies based on device and disease states. Performed at [facility], [address] [place], [place] [address]   APTT     Status: None   Collection Time: 05/22/20  6:05 PM  Result Value Ref Range   aPTT 35 24 - 36 seconds    Comment: Performed at [facility], [address] [place], [place] [address]  CBC     Status: Abnormal   Collection Time: 05/22/20  6:05  PM  Result Value Ref Range   WBC 7.3 4.0 - 10.5 K/uL   RBC 4.00 (L) 4.22 - 5.81 MIL/uL   Hemoglobin 11.9 (L) 13.0 - 17.0 g/dL   HCT 36.9 (L) 39.0 - 52.0 %   MCV 92.3 80.0 - 100.0 fL   MCH 29.8 26.0 - 34.0 pg   MCHC 32.2 30.0 - 36.0 g/dL   RDW 13.8 11.5 - 15.5 %   Platelets 194 150 - 400 K/uL   nRBC 0.0 0.0 - 0.2 %    Comment: Performed at [facility], [address], [place], [place] [address]  Differential     Status: Abnormal   Collection Time: 05/22/20  6:05 PM  Result Value Ref Range   Neutrophils Relative % 82 %   Neutro Abs 6.0 1.7 - 7.7 K/uL   Lymphocytes Relative 6 %   Lymphs Abs 0.4 (L) 0.7 - 4.0 K/uL   Monocytes Relative 12 %  Monocytes Absolute 0.9 0.1 - 1.0 K/uL   Eosinophils Relative 0 %   Eosinophils Absolute 0.0 0.0 - 0.5 K/uL   Basophils Relative 0 %   Basophils Absolute 0.0 0.0 - 0.1 K/uL   Immature Granulocytes 0 %   Abs Immature Granulocytes 0.03 0.00 - 0.07 K/uL    Comment: Performed at [facility], [address] [place], [place] [address]  Comprehensive metabolic panel     Status: Abnormal   Collection Time: 05/22/20  6:05 PM  Result Value Ref Range   Sodium 134 (L) 135 - 145 mmol/L   Potassium 4.1 3.5 - 5.1 mmol/L   Chloride 100 98 - 111 mmol/L   CO2 27 22 - 32 mmol/L   Glucose, Bld 105 (H) 70 - 99 mg/dL    Comment: Glucose reference range applies only to samples taken after fasting for at least 8 hours.   BUN 38 (H) 8 - 23 mg/dL   Creatinine, Ser 1.61 (H) 0.61 - 1.24 mg/dL   Calcium 9.0 8.9 - 10.3 mg/dL   Total Protein 7.3 6.5 - 8.1 g/dL   Albumin 4.0 3.5 - 5.0 g/dL   AST 27 15 - 41 U/L   ALT 21 0 - 44 U/L   Alkaline Phosphatase 82 38 - 126 U/L   Total Bilirubin 0.8 0.3 - 1.2 mg/dL   GFR, Estimated 43 (L) >60 mL/min    Comment: (NOTE) Calculated using the CKD-EPI Creatinine Equation (2021)    Anion gap 7 5 - 15    Comment: Performed at [facility], [address] [place], [place] [address]  Lactic acid,  plasma     Status: None   Collection Time: 05/22/20  6:05 PM  Result Value Ref Range   Lactic Acid, Venous 1.1 0.5 - 1.9 mmol/L    Comment: Performed at [facility], [address] [place],  [address]  POC SARS Coronavirus 2 Ag-ED - Nasal Swab     Status: Abnormal   Collection Time: 05/22/20 10:54 PM  Result Value Ref Range   SARS Coronavirus 2 Ag POSITIVE (A) NEGATIVE    Comment: (NOTE) SARS-CoV-2 antigen PRESENT.  Positive results indicate the presence of viral antigens, but clinical correlation with patient history and other diagnostic information is necessary to determine patient infection status.  Positive results do not rule out bacterial infection or co-infection  with other viruses. False positive results are rare but can occur, and confirmatory RT-PCR testing may be appropriate in some circumstances. The expected result is Negative.  Fact Sheet for Patients: PodPark.tn Fact Sheet for Providers: GiftContent.is   This test is not yet approved or cleared by the [place] FDA and  has been authorized for detection and/or diagnosis of SARS-CoV-2 by FDA under an Emergency Use Authorization (EUA).  This EUA will remain in effect (meaning this test can be used) for the duration of  the COVID-19 declaration under Section 564(b)(1) of the Act, 21 U.S.C. section 360bbb-3(b)(1), unless  the authorization is terminated or revoked sooner.    Urinalysis, Complete w Microscopic     Status: Abnormal   Collection Time: 05/22/20 11:59 PM  Result Value Ref Range   Color, Urine YELLOW (A) YELLOW   APPearance HAZY (A) CLEAR   Specific Gravity, Urine 1.019 1.005 - 1.030   pH 5.0 5.0 - 8.0   Glucose, UA NEGATIVE NEGATIVE mg/dL   Hgb urine dipstick MODERATE (A) NEGATIVE   Bilirubin Urine NEGATIVE NEGATIVE   Ketones, ur NEGATIVE  NEGATIVE mg/dL   Protein, ur NEGATIVE NEGATIVE mg/dL   Nitrite NEGATIVE NEGATIVE    Leukocytes,Ua NEGATIVE NEGATIVE   RBC / HPF 6-10 0 - 5 RBC/hpf   WBC, UA 6-10 0 - 5 WBC/hpf   Bacteria, UA RARE (A) NONE SEEN   Squamous Epithelial / LPF NONE SEEN 0 - 5   Mucus PRESENT    Hyaline Casts, UA PRESENT     Comment: Performed at [facility], [address] [place], [place] [address]  Urine Culture     Status: None   Collection Time: 05/22/20 11:59 PM   Specimen: Urine, Random  Result Value Ref Range   Specimen Description      URINE, RANDOM Performed at [facility], [address] [place], [place] [address]    Special Requests      Normal Performed at [facility], [address], [place], [place] [address]    Culture      NO GROWTH Performed at [facility], [place] [name], [place], [place] [address]    Report Status 05/24/2020 FINAL   Basic metabolic panel     Status: Abnormal   Collection Time: 05/23/20  4:23 AM  Result Value Ref Range   Sodium 139 135 - 145 mmol/L   Potassium 3.9 3.5 - 5.1 mmol/L   Chloride 105 98 - 111 mmol/L   CO2 24 22 - 32 mmol/L   Glucose, Bld 88 70 - 99 mg/dL    Comment: Glucose reference range applies only to samples taken after fasting for at least 8 hours.   BUN 34 (H) 8 - 23 mg/dL   Creatinine, Ser 1.58 (H) 0.61 - 1.24 mg/dL   Calcium 8.7 (L) 8.9 - 10.3 mg/dL   GFR, Estimated 44 (L) >60 mL/min    Comment: (NOTE) Calculated using the CKD-EPI Creatinine Equation (2021)    Anion gap 10 5 - 15    Comment: Performed at [facility], [address] [place], [place] [address]  Procalcitonin - Baseline     Status: None   Collection Time: 05/23/20  4:23 AM  Result Value Ref Range   Procalcitonin 0.20 ng/mL    Comment:        Interpretation: PCT (Procalcitonin) <= 0.5 ng/mL: Systemic infection (sepsis) is not likely. Local bacterial infection is possible. (NOTE)       Sepsis PCT Algorithm           Lower Respiratory Tract                                      Infection PCT  Algorithm    ----------------------------     ----------------------------         PCT < 0.25 ng/mL                PCT < 0.10 ng/mL          Strongly encourage             Strongly discourage   discontinuation of antibiotics    initiation of antibiotics    ----------------------------     -----------------------------       PCT 0.25 - 0.50 ng/mL            PCT 0.10 - 0.25 ng/mL               OR       >80%  decrease in PCT            Discourage initiation of                                            antibiotics      Encourage discontinuation           of antibiotics    ----------------------------     -----------------------------         PCT >= 0.50 ng/mL              PCT 0.26 - 0.50 ng/mL               AND        <80% decrease in PCT             Encourage initiation of                                             antibiotics       Encourage continuation           of antibiotics    ----------------------------     -----------------------------        PCT >= 0.50 ng/mL                  PCT > 0.50 ng/mL               AND         increase in PCT                  Strongly encourage                                      initiation of antibiotics    Strongly encourage escalation           of antibiotics                                     -----------------------------                                           PCT <= 0.25 ng/mL                                                 OR                                        > 80% decrease in PCT                                      Discontinue / Do not initiate  antibiotics  Performed at [facility], [address] [place], [place] [address]   CBC with Differential/Platelet     Status: Abnormal   Collection Time: 05/24/20 10:26 AM  Result Value Ref Range   WBC 6.9 4.0 - 10.5 K/uL   RBC 4.39 4.22 - 5.81 MIL/uL   Hemoglobin 13.0 13.0 - 17.0 g/dL   HCT 40.2 39.0 - 52.0 %   MCV 91.6 80.0  - 100.0 fL   MCH 29.6 26.0 - 34.0 pg   MCHC 32.3 30.0 - 36.0 g/dL   RDW 14.0 11.5 - 15.5 %   Platelets 197 150 - 400 K/uL   nRBC 0.0 0.0 - 0.2 %   Neutrophils Relative % 79 %   Neutro Abs 5.5 1.7 - 7.7 K/uL   Lymphocytes Relative 9 %   Lymphs Abs 0.6 (L) 0.7 - 4.0 K/uL   Monocytes Relative 12 %   Monocytes Absolute 0.8 0.1 - 1.0 K/uL   Eosinophils Relative 0 %   Eosinophils Absolute 0.0 0.0 - 0.5 K/uL   Basophils Relative 0 %   Basophils Absolute 0.0 0.0 - 0.1 K/uL   Immature Granulocytes 0 %   Abs Immature Granulocytes 0.02 0.00 - 0.07 K/uL    Comment: Performed at [facility], [address], [place], [place] [id number]  Basic metabolic panel     Status: Abnormal   Collection Time: 05/24/20 10:26 AM  Result Value Ref Range   Sodium 141 135 - 145 mmol/L   Potassium 4.6 3.5 - 5.1 mmol/L   Chloride 105 98 - 111 mmol/L   CO2 22 22 - 32 mmol/L   Glucose, Bld 110 (H) 70 - 99 mg/dL    Comment: Glucose reference range applies only to samples taken after fasting for at least 8 hours.   BUN 29 (H) 8 - 23 mg/dL   Creatinine, Ser 1.59 (H) 0.61 - 1.24 mg/dL   Calcium 9.1 8.9 - 10.3 mg/dL   GFR, Estimated 44 (L) >60 mL/min    Comment: (NOTE) Calculated using the CKD-EPI Creatinine Equation (2021)    Anion gap 14 5 - 15    Comment: Performed at [facility], [address] [place], [place] [address]  C-reactive protein     Status: Abnormal   Collection Time: 05/24/20 10:26 AM  Result Value Ref Range   CRP 8.1 (H) <1.0 mg/dL    Comment: Performed at [facility] [address], [place], [place] [address]  Procalcitonin - Baseline     Status: None   Collection Time: 05/24/20 10:26 AM  Result Value Ref Range   Procalcitonin 0.16 ng/mL    Comment:        Interpretation: PCT (Procalcitonin) <= 0.5 ng/mL: Systemic infection (sepsis) is not likely. Local bacterial infection is possible. (NOTE)       Sepsis PCT Algorithm           Lower Respiratory Tract                                       Infection PCT Algorithm    ----------------------------     ----------------------------         PCT < 0.25 ng/mL                PCT < 0.10 ng/mL          Strongly encourage  Strongly discourage   discontinuation of antibiotics    initiation of antibiotics    ----------------------------     -----------------------------       PCT 0.25 - 0.50 ng/mL            PCT 0.10 - 0.25 ng/mL               OR       >80% decrease in PCT            Discourage initiation of                                            antibiotics      Encourage discontinuation           of antibiotics    ----------------------------     -----------------------------         PCT >= 0.50 ng/mL              PCT 0.26 - 0.50 ng/mL               AND        <80% decrease in PCT             Encourage initiation of                                             antibiotics       Encourage continuation           of antibiotics    ----------------------------     -----------------------------        PCT >= 0.50 ng/mL                  PCT > 0.50 ng/mL               AND         increase in PCT                  Strongly encourage                                      initiation of antibiotics    Strongly encourage escalation           of antibiotics                                     -----------------------------                                           PCT <= 0.25 ng/mL                                                 OR                                        >  80% decrease in PCT                                      Discontinue / Do not initiate                                             antibiotics  Performed at [facility], [address] [place], [place] [address]   ECHOCARDIOGRAM COMPLETE     Status: None   Collection Time: 05/24/20 11:10 AM  Result Value Ref Range   Weight 2,000 oz   Height 71 in   BP 151/71 mmHg   Ao pk vel 1.98 m/s   AV Area VTI 1.81 cm2    AR max vel 1.69 cm2   AV Mean grad 8.0 mmHg   AV Peak grad 15.7 mmHg   S' Lateral 2.60 cm   AV Area mean vel 1.76 cm2   Area-P 1/2 3.40 cm2   P 1/2 time 545 msec   MV VTI 2.11 cm2  CBC with Differential/Platelet     Status: Abnormal   Collection Time: 05/25/20  4:59 AM  Result Value Ref Range   WBC 6.4 4.0 - 10.5 K/uL   RBC 4.23 4.22 - 5.81 MIL/uL   Hemoglobin 12.7 (L) 13.0 - 17.0 g/dL   HCT 38.1 (L) 39.0 - 52.0 %   MCV 90.1 80.0 - 100.0 fL   MCH 30.0 26.0 - 34.0 pg   MCHC 33.3 30.0 - 36.0 g/dL   RDW 14.2 11.5 - 15.5 %   Platelets 214 150 - 400 K/uL   nRBC 0.0 0.0 - 0.2 %   Neutrophils Relative % 80 %   Neutro Abs 5.0 1.7 - 7.7 K/uL   Lymphocytes Relative 10 %   Lymphs Abs 0.7 0.7 - 4.0 K/uL   Monocytes Relative 10 %   Monocytes Absolute 0.7 0.1 - 1.0 K/uL   Eosinophils Relative 0 %   Eosinophils Absolute 0.0 0.0 - 0.5 K/uL   Basophils Relative 0 %   Basophils Absolute 0.0 0.0 - 0.1 K/uL   WBC Morphology MORPHOLOGY UNREMARKABLE    RBC Morphology MORPHOLOGY UNREMARKABLE    Smear Review Normal platelet morphology    Immature Granulocytes 0 %   Abs Immature Granulocytes 0.02 0.00 - 0.07 K/uL    Comment: Performed at [facility], [address] [place], [place] [id number]  Basic metabolic panel     Status: Abnormal   Collection Time: 05/25/20  4:59 AM  Result Value Ref Range   Sodium 141 135 - 145 mmol/L   Potassium 3.8 3.5 - 5.1 mmol/L   Chloride 107 98 - 111 mmol/L   CO2 22 22 - 32 mmol/L   Glucose, Bld 138 (H) 70 - 99 mg/dL    Comment: Glucose reference range applies only to samples taken after fasting for at least 8 hours.   BUN 35 (H) 8 - 23 mg/dL   Creatinine, Ser 1.63 (H) 0.61 - 1.24 mg/dL   Calcium 8.9 8.9 - 10.3 mg/dL   GFR, Estimated 43 (L) >60 mL/min    Comment: (NOTE) Calculated using the CKD-EPI Creatinine Equation (2021)    Anion gap 12 5 - 15    Comment: Performed at [facility], [address] [place], [place]  [address]   C-reactive protein     Status: Abnormal   Collection Time: 05/25/20  4:59 AM  Result Value Ref Range   CRP 14.6 (H) <1.0 mg/dL    Comment: Performed at [facility], [address], [place], [place] [address]  CBC with Differential/Platelet     Status: None   Collection Time: 05/26/20  5:48 AM  Result Value Ref Range   WBC 6.2 4.0 - 10.5 K/uL   RBC 4.40 4.22 - 5.81 MIL/uL   Hemoglobin 13.0 13.0 - 17.0 g/dL   HCT 21.3 08.6 - 57.8 %   MCV 92.5 80.0 - 100.0 fL   MCH 29.5 26.0 - 34.0 pg   MCHC 31.9 30.0 - 36.0 g/dL   RDW 46.9 62.9 - 52.8 %   Platelets 221 150 - 400 K/uL   nRBC 0.0 0.0 - 0.2 %   Neutrophils Relative % 76 %   Neutro Abs 4.7 1.7 - 7.7 K/uL   Lymphocytes Relative 15 %   Lymphs Abs 0.9 0.7 - 4.0 K/uL   Monocytes Relative 9 %   Monocytes Absolute 0.5 0.1 - 1.0 K/uL   Eosinophils Relative 0 %   Eosinophils Absolute 0.0 0.0 - 0.5 K/uL   Basophils Relative 0 %   Basophils Absolute 0.0 0.0 - 0.1 K/uL   WBC Morphology MORPHOLOGY UNREMARKABLE    RBC Morphology MORPHOLOGY UNREMARKABLE    Smear Review Normal platelet morphology    Immature Granulocytes 0 %   Abs Immature Granulocytes 0.01 0.00 - 0.07 K/uL    Comment: Performed at [facility], [address], [place], [place] [address]  Basic metabolic panel     Status: Abnormal   Collection Time: 05/26/20  5:48 AM  Result Value Ref Range   Sodium 146 (H) 135 - 145 mmol/L   Potassium 4.1 3.5 - 5.1 mmol/L   Chloride 107 98 - 111 mmol/L   CO2 27 22 - 32 mmol/L   Glucose, Bld 113 (H) 70 - 99 mg/dL    Comment: Glucose reference range applies only to samples taken after fasting for at least 8 hours.   BUN 41 (H) 8 - 23 mg/dL   Creatinine, Ser 4.01 (H) 0.61 - 1.24 mg/dL   Calcium 9.4 8.9 - 02.7 mg/dL   GFR, Estimated 44 (L) >60 mL/min    Comment: (NOTE) Calculated using the CKD-EPI Creatinine Equation (2021)    Anion gap 12 5 - 15    Comment: Performed at [facility], [address]  [place], [place] [address]  C-reactive protein     Status: Abnormal   Collection Time: 05/26/20  5:48 AM  Result Value Ref Range   CRP 21.2 (H) <1.0 mg/dL    Comment: Performed at [facility], [address] [place], [place] [address]  Basic metabolic panel     Status: Abnormal   Collection Time: 05/27/20  5:28 AM  Result Value Ref Range   Sodium 150 (H) 135 - 145 mmol/L   Potassium 3.7 3.5 - 5.1 mmol/L   Chloride 114 (H) 98 - 111 mmol/L   CO2 24 22 - 32 mmol/L   Glucose, Bld 116 (H) 70 - 99 mg/dL    Comment: Glucose reference range applies only to samples taken after fasting for at least 8 hours.   BUN 44 (H) 8 - 23 mg/dL   Creatinine, Ser 7.42 (H) 0.61 - 1.24 mg/dL   Calcium 9.1 8.9 - 59.5 mg/dL  GFR, Estimated 51 (L) >60 mL/min    Comment: (NOTE) Calculated using the CKD-EPI Creatinine Equation (2021)    Anion gap 12 5 - 15    Comment: Performed at [facility], [address] [place], [place] [address]  C-reactive protein     Status: Abnormal   Collection Time: 05/27/20  5:28 AM  Result Value Ref Range   CRP 22.5 (H) <1.0 mg/dL    Comment: Performed at [place] [address] [place], [place] [id number]  Basic metabolic panel     Status: Abnormal   Collection Time: 05/28/20  7:24 AM  Result Value Ref Range   Sodium 153 (H) 135 - 145 mmol/L   Potassium 3.7 3.5 - 5.1 mmol/L   Chloride 116 (H) 98 - 111 mmol/L   CO2 27 22 - 32 mmol/L   Glucose, Bld 128 (H) 70 - 99 mg/dL    Comment: Glucose reference range applies only to samples taken after fasting for at least 8 hours.   BUN 46 (H) 8 - 23 mg/dL   Creatinine, Ser 1.54 (H) 0.61 - 1.24 mg/dL   Calcium 9.4 8.9 - 10.3 mg/dL   GFR, Estimated 45 (L) >60 mL/min    Comment: (NOTE) Calculated using the CKD-EPI Creatinine Equation (2021)    Anion gap 10 5 - 15    Comment: Performed at [facility], [address] [place], [place] [address]  C-reactive protein     Status: Abnormal   Collection  Time: 05/28/20  7:24 AM  Result Value Ref Range   CRP 20.4 (H) <1.0 mg/dL    Comment: Performed at [place] [address], [place], [place] [address]  C-reactive protein     Status: Abnormal   Collection Time: 05/29/20  4:32 AM  Result Value Ref Range   CRP 13.9 (H) <1.0 mg/dL    Comment: Performed at [place] [address], [place] [address]  CBC     Status: None   Collection Time: 05/29/20  4:32 AM  Result Value Ref Range   WBC 9.5 4.0 - 10.5 K/uL   RBC 4.65 4.22 - 5.81 MIL/uL   Hemoglobin 13.7 13.0 - 17.0 g/dL   HCT 42.7 39.0 - 52.0 %   MCV 91.8 80.0 - 100.0 fL   MCH 29.5 26.0 - 34.0 pg   MCHC 32.1 30.0 - 36.0 g/dL   RDW 14.1 11.5 - 15.5 %   Platelets 298 150 - 400 K/uL   nRBC 0.0 0.0 - 0.2 %    Comment: Performed at [facility], [address], [place], [place] [address]  Ammonia     Status: None   Collection Time: 05/29/20  4:48 PM  Result Value Ref Range   Ammonia 11 9 - 35 umol/L    Comment: Performed at [facility], [address] [place], [place] [address]  Folate     Status: None   Collection Time: 05/29/20  4:48 PM  Result Value Ref Range   Folate 16.8 >5.9 ng/mL    Comment: Performed at [facility], [address] [place], [place] [address]  Methylmalonic acid, serum     Status: None   Collection Time: 05/29/20  4:48 PM  Result Value Ref Range   Methylmalonic Acid, Quantitative 187 0 - 378 nmol/L    Comment: (NOTE) This test was developed and its performance characteristics determined by [facility]. It has not been cleared or  approved by the Food and Drug Administration. Performed At: [facility], [place] [id number] Rush Farmer MD IO:[id number]   Vitamin B12     Status: Abnormal   Collection Time: 05/29/20  4:48 PM  Result Value Ref Range   Vitamin B-12 7,357 (H) 180 - 914 pg/mL    Comment: (NOTE) This assay is not validated for testing neonatal  or myeloproliferative syndrome specimens for Vitamin B12 levels. Performed at [facility], [place] [address] [place], [place] [address]   TSH     Status: None   Collection Time: 05/29/20  4:48 PM  Result Value Ref Range   TSH 2.791 0.350 - 4.500 uIU/mL    Comment: Performed by a 3rd Generation assay with a functional sensitivity of <=0.01 uIU/mL. Performed at [facility], [address] [place], [place] [address]   C-reactive protein     Status: Abnormal   Collection Time: 05/30/20  6:30 AM  Result Value Ref Range   CRP 9.9 (H) <1.0 mg/dL    Comment: Performed at [facility] [address] [place], [place] [address]  C-reactive protein     Status: Abnormal   Collection Time: 05/31/20  4:57 AM  Result Value Ref Range   CRP 7.7 (H) <1.0 mg/dL    Comment: Performed at [facility] [address], [place], [place] [address]  Basic metabolic panel     Status: Abnormal   Collection Time: 05/31/20  4:57 AM  Result Value Ref Range   Sodium 162 (HH) 135 - 145 mmol/L    Comment: CRITICAL RESULT CALLED TO, READ BACK BY AND VERIFIED WITH AMANDA PEREZ RN [id number] 05/31/20 HNM    Potassium 3.5 3.5 - 5.1 mmol/L   Chloride 121 (H) 98 - 111 mmol/L   CO2 28 22 - 32 mmol/L   Glucose, Bld 133 (H) 70 - 99 mg/dL    Comment: Glucose reference range applies only to samples taken after fasting for at least 8 hours.   BUN 58 (H) 8 - 23 mg/dL   Creatinine, Ser 2.01 (H) 0.61 - 1.24 mg/dL   Calcium 9.4 8.9 - 10.3 mg/dL   GFR, Estimated 33 (L) >60 mL/min    Comment: (NOTE) Calculated using the CKD-EPI Creatinine Equation (2021)    Anion gap 13 5 - 15    Comment: Performed at [facility], [address] [place], [place] [address]  CBC     Status: Abnormal   Collection Time: 05/31/20  4:57 AM  Result Value Ref Range   WBC 11.3 (H) 4.0 - 10.5 K/uL   RBC 4.72 4.22 - 5.81 MIL/uL   Hemoglobin 13.7 13.0 - 17.0 g/dL   HCT 43.9 39.0 - 52.0 %   MCV 93.0 80.0 - 100.0  fL   MCH 29.0 26.0 - 34.0 pg   MCHC 31.2 30.0 - 36.0 g/dL   RDW 14.1 11.5 - 15.5 %   Platelets 367 150 - 400 K/uL   nRBC 0.0 0.0 - 0.2 %    Comment: Performed at [facility], [address] [place], [place] [address]  C-reactive protein     Status: Abnormal   Collection Time: 06/01/20  5:09 AM  Result Value Ref Range   CRP 5.4 (H) <1.0 mg/dL    Comment: Performed at [facility] [address] [place], [place] [address]  Basic metabolic panel     Status: Abnormal   Collection Time:  06/01/20  5:09 AM  Result Value Ref Range   Sodium 161 (HH) 135 - 145 mmol/L    Comment: CRITICAL RESULT CALLED TO, READ BACK BY AND VERIFIED WITH DEBBIE PINKERTON AT [id number] 06/01/20.PMF   Potassium 3.6 3.5 - 5.1 mmol/L   Chloride 122 (H) 98 - 111 mmol/L   CO2 25 22 - 32 mmol/L   Glucose, Bld 121 (H) 70 - 99 mg/dL    Comment: Glucose reference range applies only to samples taken after fasting for at least 8 hours.   BUN 59 (H) 8 - 23 mg/dL   Creatinine, Ser 2.09 (H) 0.61 - 1.24 mg/dL   Calcium 9.4 8.9 - 10.3 mg/dL   GFR, Estimated 31 (L) >60 mL/min    Comment: (NOTE) Calculated using the CKD-EPI Creatinine Equation (2021)    Anion gap 14 5 - 15    Comment: Performed at [facility], [address] [place], [place] [id number]  Basic metabolic panel     Status: Abnormal   Collection Time: 06/02/20  5:16 AM  Result Value Ref Range   Sodium 158 (H) 135 - 145 mmol/L   Potassium 3.6 3.5 - 5.1 mmol/L   Chloride 124 (H) 98 - 111 mmol/L   CO2 22 22 - 32 mmol/L   Glucose, Bld 129 (H) 70 - 99 mg/dL    Comment: Glucose reference range applies only to samples taken after fasting for at least 8 hours.   BUN 50 (H) 8 - 23 mg/dL   Creatinine, Ser 2.03 (H) 0.61 - 1.24 mg/dL   Calcium 8.9 8.9 - 10.3 mg/dL   GFR, Estimated 33 (L) >60 mL/min    Comment: (NOTE) Calculated using the CKD-EPI Creatinine Equation (2021)    Anion gap 12 5 - 15    Comment: Performed at [facility],  [address] [place], [place] [address]  C-reactive protein     Status: Abnormal   Collection Time: 06/02/20  5:17 AM  Result Value Ref Range   CRP 5.1 (H) <1.0 mg/dL    Comment: Performed at [facility] [address] [place], [place] [id number]  Basic metabolic panel     Status: Abnormal   Collection Time: 06/03/20  6:42 AM  Result Value Ref Range   Sodium 151 (H) 135 - 145 mmol/L   Potassium 3.8 3.5 - 5.1 mmol/L   Chloride 118 (H) 98 - 111 mmol/L   CO2 24 22 - 32 mmol/L   Glucose, Bld 113 (H) 70 - 99 mg/dL    Comment: Glucose reference range applies only to samples taken after fasting for at least 8 hours.   BUN 34 (H) 8 - 23 mg/dL   Creatinine, Ser 1.73 (H) 0.61 - 1.24 mg/dL   Calcium 8.6 (L) 8.9 - 10.3 mg/dL   GFR, Estimated 39 (L) >60 mL/min    Comment: (NOTE) Calculated using the CKD-EPI Creatinine Equation (2021)    Anion gap 9 5 - 15    Comment: Performed at [facility], [address] [place],  [id number]  Basic metabolic panel     Status: Abnormal   Collection Time: 06/04/20  8:46 AM  Result Value Ref Range   Sodium 148 (H) 135 - 145 mmol/L   Potassium 3.8 3.5 - 5.1 mmol/L   Chloride 112 (H) 98 - 111 mmol/L   CO2 23 22 - 32 mmol/L   Glucose, Bld 131 (H) 70 - 99 mg/dL    Comment: Glucose reference  range applies only to samples taken after fasting for at least 8 hours.   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  He has been admitted to home hospice, [facility], I will be attending on record, initiating services soon through home hospice.  Difficulty with dysphagia diet and swallowing pills  He has not benefitted as much from sinemet, this was held for while, then restarted, caused some side effects - advised he can hold if difficulty taking pill right now if tremors are controlled.  May hold Levothyroxine 6mcg since low dose, and prior controlled, if cannot take pill but may resume when able.  Continue Mirtazapine, may resume Donepezil as well since on for long term.  May pursue comfort care based on hospice recommendation.  Counseling given today on improving nutrition as able.  No orders of the defined types were placed in this encounter.   Follow-up: PRN  Patient verbalizes understanding with the above medical recommendations including the limitation of remote medical advice.  Specific follow-up and call-back criteria were given for patient to follow-up or seek medical care more urgently if needed.   - Time spent in direct consultation with patient on phone: 20 minutes  [name], [facility] 06/06/2020, 4:02 PM

## 2020-06-07 ENCOUNTER — Ambulatory Visit: Payer: Medicare Other | Admitting: Licensed Clinical Social Worker

## 2020-06-07 ENCOUNTER — Telehealth: Payer: Self-pay | Admitting: General Practice

## 2020-06-07 ENCOUNTER — Telehealth: Payer: Self-pay

## 2020-06-07 DIAGNOSIS — Z515 Encounter for palliative care: Secondary | ICD-10-CM

## 2020-06-07 DIAGNOSIS — F0280 Dementia in other diseases classified elsewhere without behavioral disturbance: Secondary | ICD-10-CM

## 2020-06-07 DIAGNOSIS — N1832 Chronic kidney disease, stage 3b: Secondary | ICD-10-CM

## 2020-06-07 DIAGNOSIS — E440 Moderate protein-calorie malnutrition: Secondary | ICD-10-CM

## 2020-06-07 DIAGNOSIS — E034 Atrophy of thyroid (acquired): Secondary | ICD-10-CM

## 2020-06-07 DIAGNOSIS — G3183 Dementia with Lewy bodies: Secondary | ICD-10-CM

## 2020-06-07 NOTE — Telephone Encounter (Signed)
I left a very detail message on [employer] with Dr. Raliegh Ip recommendation. I also left the return phone # if any questions.

## 2020-06-07 NOTE — Chronic Care Management (AMB) (Signed)
Care Management   Follow Up Note   06/07/2020 Name: Blake Bates MRN: [id number] DOB: 1940/05/20   Referred by: [name], DO Reason for referral : No chief complaint on file.   Blake Bates is a 80 y.o. year old male who is a primary care patient of [name], DO. The care management team was consulted for assistance with care management and care coordination needs.    Review of patient status, including review of consultants reports, relevant laboratory and other test results, and collaboration with appropriate care team members and the patient's provider was performed as part of comprehensive patient evaluation and provision of chronic care management services.    LCSW completed CCM outreach attempt today and informed family that patient would be discharged from CCM program now that he is enrolling in [facility]. Spouse was very understanding and stated appreciation for our past services and support.  Goals Addressed    .  COMPLETED: SW- "We need more in home support for [name]." (pt-stated)        CARE PLAN ENTRY (see longitudinal plan of care for additional care plan information)  Current Barriers:  . Financial constraints related to affording a private caregiver . Limited social support . Level of care concerns . ADL IADL limitations . Social Isolation . Limited access to caregiver . Inability to perform ADL's independently . Inability to perform IADL's independently . Lacks knowledge of community resource: available support groups and senior resources within their area  Clinical Social Work Clinical Goal(s):  [name] Over the next 120 days, patient/caregiver will work with SW, RNCM and C3 Guide to address concerns related to care coordination needs and lack of education/support/resource connection. LCSW will assist patient in gaining community resource education and additional support and resource connection as well in order to  maintain health and mental health appropriately  . Over the next 120 days, patient will demonstrate improved adherence to self care as evidenced by implementing healthy self-care into her daily routine such as: attending all medical appointments, deep breathing exercises, taking time for self-reflection, taking medications as prescribed, participate in PT/OT, drinking water and daily exercise to improve mobility and mood.  . Over the next 120 days, patient will demonstrate improved health management independence as evidenced by implementing healthy self-care skills and positive support/resources into her daily routine to help cope with stressors and improve overall health and well-being  . Over the next 120 days, patient or caregiver will verbalize basic understanding of depression/stress process and self health management plan as evidenced by her participation in development of long term plan of care and institution of self health management strategies  Interventions: . Inter-disciplinary care team collaboration (see longitudinal plan of care) . Patient interviewed and appropriate assessments performed . Referred patient to community resources care guide team for assistance with in home support. CCM RNCM has already placed C3 referral. LCSW will not make duplicate referral but will send update to C3 team. [place], PCP and Care Guide completed 3 way call and discussed case. Care Guide was in need of two specific codes in order to get personal care service approval. Care Guide was needing help with finding out what the Medicare procedure code for an in home health aide is so that we can make a prior authorization request through his insurance. PCP completed order for [facility] orders as well. Care Guide has worked hard on helping family gain additional care within the home and will keep CCM  team updated for any future needs regarding this referral. UPDATE 03/08/20-Care Guide was able to  connect patient with [facility] In Home Aide through [employer]. Spouse reports that the aide Lorriane Shire started her visits last week and will be coming 2x per week to provide care. Spouse shares that [employer] will make another order for CNA once services end. Spouse reports that her  . Provided mental health counseling with regard to caregiver burnout prevention. Caregiver was encouraged to start attending support groups within her area to gain emotional support and additional personal care resource education connections.  . Provided patient's caregiver with information about C.H.O.R.E program, [facility], Medicaid eligibility and Day Programs. Family reports that they would not qualify for Medicaid. They are interested in gaining in home support but are unable to pay for these services privately. Family is interested in C.H.O.R.E program.  . Discussed plans with patient for ongoing care management follow up and provided patient with direct contact information for care management team . Advised patient's caregiver to check email for dementia support groups, senior center information and [place] that was emailed securely to spouse on 01/12/20. Resources successfully received per spouse.  . Assisted patient/caregiver with obtaining information about health plan benefits . Provided education and assistance to client regarding Advanced Directives. . Provided education to patient/caregiver regarding level of care options. . Provided education to patient/caregiver about Hospice and/or Palliative Care services . Spouse reports having a friend Langley Gauss come by to help her with care giving which has been helpful. Spouse shares that Langley Gauss is "somewhat" living with family now.  [name] Spouse is able to take her first vacation in over 2 years to visit her daughter in law, granddaughter and cousin in [place]. Spouse reports that she would not have been able to travel  without the help of [facility] staff, with their resource connection and support.  . Patient attended neurologist appointment on 03/06/20 and family was informed that his condition has stayed the same.   Patient Self Care Activities:  . Attends all scheduled provider appointments . Calls provider office for new concerns or questions . Lacks social connections . Unable to perform ADLs independently . Unable to perform IADLs independently  Please see past updates related to this goal by clicking on the "Past Updates" button in the selected goal      CCM LCSW and RNCM received confirmation on 06/07/20 that PCP will be completing Hospice orders and patient will enroll in Hospice services. CCM LCSW will close case at this time due to Hospice enrollment and will complete patient's goals. CCM LCSW provided update on CCM case closure to spouse on 06/07/20. PCP will let CCM LCSW or CCM RNCM know if patient is in need of our services and support in the future.   Eula Fried, BSW, MSW, [email or web address] Phone: [phone]

## 2020-06-07 NOTE — Telephone Encounter (Signed)
  Chronic Care Management   Outreach Note  06/07/2020 Name: Blake Bates MRN: [id number] DOB: 1940-11-04  Referred by: [name], DO Reason for referral : Appointment (RNCM: Follow up for Red Emmi call post discharge from the Hospital )   An unsuccessful telephone outreach was attempted today. The patient was referred to the case management team for assistance with care management and care coordination.   Follow Up Plan: A HIPAA compliant phone message was left for the patient providing contact information and requesting a return call.   Noreene Larsson RN, MSN, [facility] Mobile: [phone]

## 2020-06-07 NOTE — Telephone Encounter (Signed)
I called [facility] and spoke with a Davina. She is covering for [employer] who normally handles admits. Lamar Blinks stated that she will get the order faxed over ASAP to our office for the patient.

## 2020-06-08 ENCOUNTER — Telehealth: Payer: Self-pay

## 2020-06-08 NOTE — Telephone Encounter (Signed)
Received fax of [facility] orders.  All signed and faxed back at 448pm on 06/08/20  [name], [facility] 06/08/2020, 4:48 PM

## 2020-06-08 NOTE — Telephone Encounter (Signed)
I spoke with Crystal from admit at [facility] and she informed me that they will fax over the Care packet today.

## 2020-06-11 ENCOUNTER — Telehealth: Payer: Self-pay | Admitting: Family Medicine

## 2020-06-11 LAB — VITAMIN B1: Vitamin B1 (Thiamine): 166 nmol/L (ref 66.5–200.0)

## 2020-06-11 NOTE — Telephone Encounter (Signed)
   Telephone encounter was:  Successful.  06/11/2020 Name: Blake Bates MRN: [id number] DOB: 05/02/1941  [name] is a 80 y.o. year old male who is a primary care patient of [name], DO . The community resource team was consulted for assistance with [place] was denied payment for patient's care from Oct. to Dec. Even after speaking with the insurance company directly with owner they said that she was out of network and would not be paid the invoice.   Care guide performed the following interventions: I spoke to [facility] to see if there was anything that we could do to help with the invoice because [place] [facility] would eat the cost and didn't want to charge the patient for previous care when EVERYONE was under the knowledge that the insurance would pay for it. I have submitted a request to the foundation for payment and spoke to pt's wife and explained what was happening. She thanked [place] for trying to get [employer] paid because they were fantastic and deserve to be paid. Patient is now on hospice. .  Follow Up Plan:  Care guide will outreach resources to assist patient with [facility] to see if they would make an exception for this patient and pay the invoice for care.    [place], Care Management Phone: [phone] Email: [email or web address]

## 2020-06-15 ENCOUNTER — Telehealth: Payer: Self-pay | Admitting: Family Medicine

## 2020-06-15 NOTE — Telephone Encounter (Signed)
   Telephone encounter was:  Successful.  06/15/2020 Name: BLANTON KARDELL MRN: [id number] DOB: 1940-09-12  [name] is a 80 y.o. year old male who is a primary care patient of [name], DO . The community resource team was consulted for assistance with Financial Difficulties related to in home care.   Care guide performed the following interventions: Investigation of community resources performed Follow up call placed to community resources to determine status of patients referral.  Follow Up Plan:  Care guide will outreach resources to assist patient with payment to the in home healthcare [place], Care Management Phone: [phone] Email: [email or web address]

## 2020-06-18 ENCOUNTER — Telehealth: Payer: Self-pay | Admitting: General Practice

## 2020-06-18 NOTE — Telephone Encounter (Signed)
  Chronic Care Management   Outreach Note  06/18/2020 Name: Blake Bates MRN: [id number] DOB: 02-08-1941  Referred by: [name], DO Reason for referral : Appointment (RNCM: 2nd attempt for red emmi from post discharge from the hospital)   A second unsuccessful telephone outreach was attempted today. The patient was referred to the case management team for assistance with care management and care coordination.     Follow Up Plan: A HIPAA compliant phone message was left for the patient providing contact information and requesting a return call.   Noreene Larsson RN, MSN, [place] [facility] Mobile: [phone]

## 2020-06-25 ENCOUNTER — Telehealth: Payer: Self-pay | Admitting: Family Medicine

## 2020-06-25 NOTE — Telephone Encounter (Signed)
   Telephone encounter was:  Successful.  06/25/2020 Name: Blake Bates MRN: [id number] DOB: [birth year]  Blake Bates is a 80 y.o. year old male who is a primary care patient of [name], DO . The community resource team was consulted for assistance with to give my condolences to his wife Blake Bates and to let her know that the [facility] took care of the [place] for them.   Care guide performed the following interventions: Spoke to wife and she said that she is doing ok. She asked about how to dispose of his medicaitons and I sent her an email with [facility] policy on getting rid of medications after a passing. I also per her request sent her information on donation sights for Blake Bates's medical equipment and incontinence items. She was thankful for the call and she understands that she can reach out to [place] anytime she needs [place]. .  Follow Up Plan:  No further follow up planned at this time. The patient has been provided with needed resources.  [place], Care Management Phone: [phone] Email: [email or web address]

## 2020-06-26 ENCOUNTER — Ambulatory Visit: Payer: Medicare Other

## 2020-07-02 ENCOUNTER — Ambulatory Visit: Payer: Medicare Other | Admitting: Neurology

## 2020-07-03 ENCOUNTER — Ambulatory Visit: Payer: Medicare Other

## 2020-07-10 DEATH — deceased

## 2020-07-26 ENCOUNTER — Telehealth: Payer: Self-pay

## 2020-10-27 IMAGING — DX DG HAND COMPLETE 3+V*R*
3 series · 3 of 3 positions shown · non-contrast
Comparison: None.

CLINICAL DATA: Fell, right hand pain, erythema, bruising, abrasions

EXAM:
RIGHT HAND - COMPLETE 3+ VIEW

[hand ap]
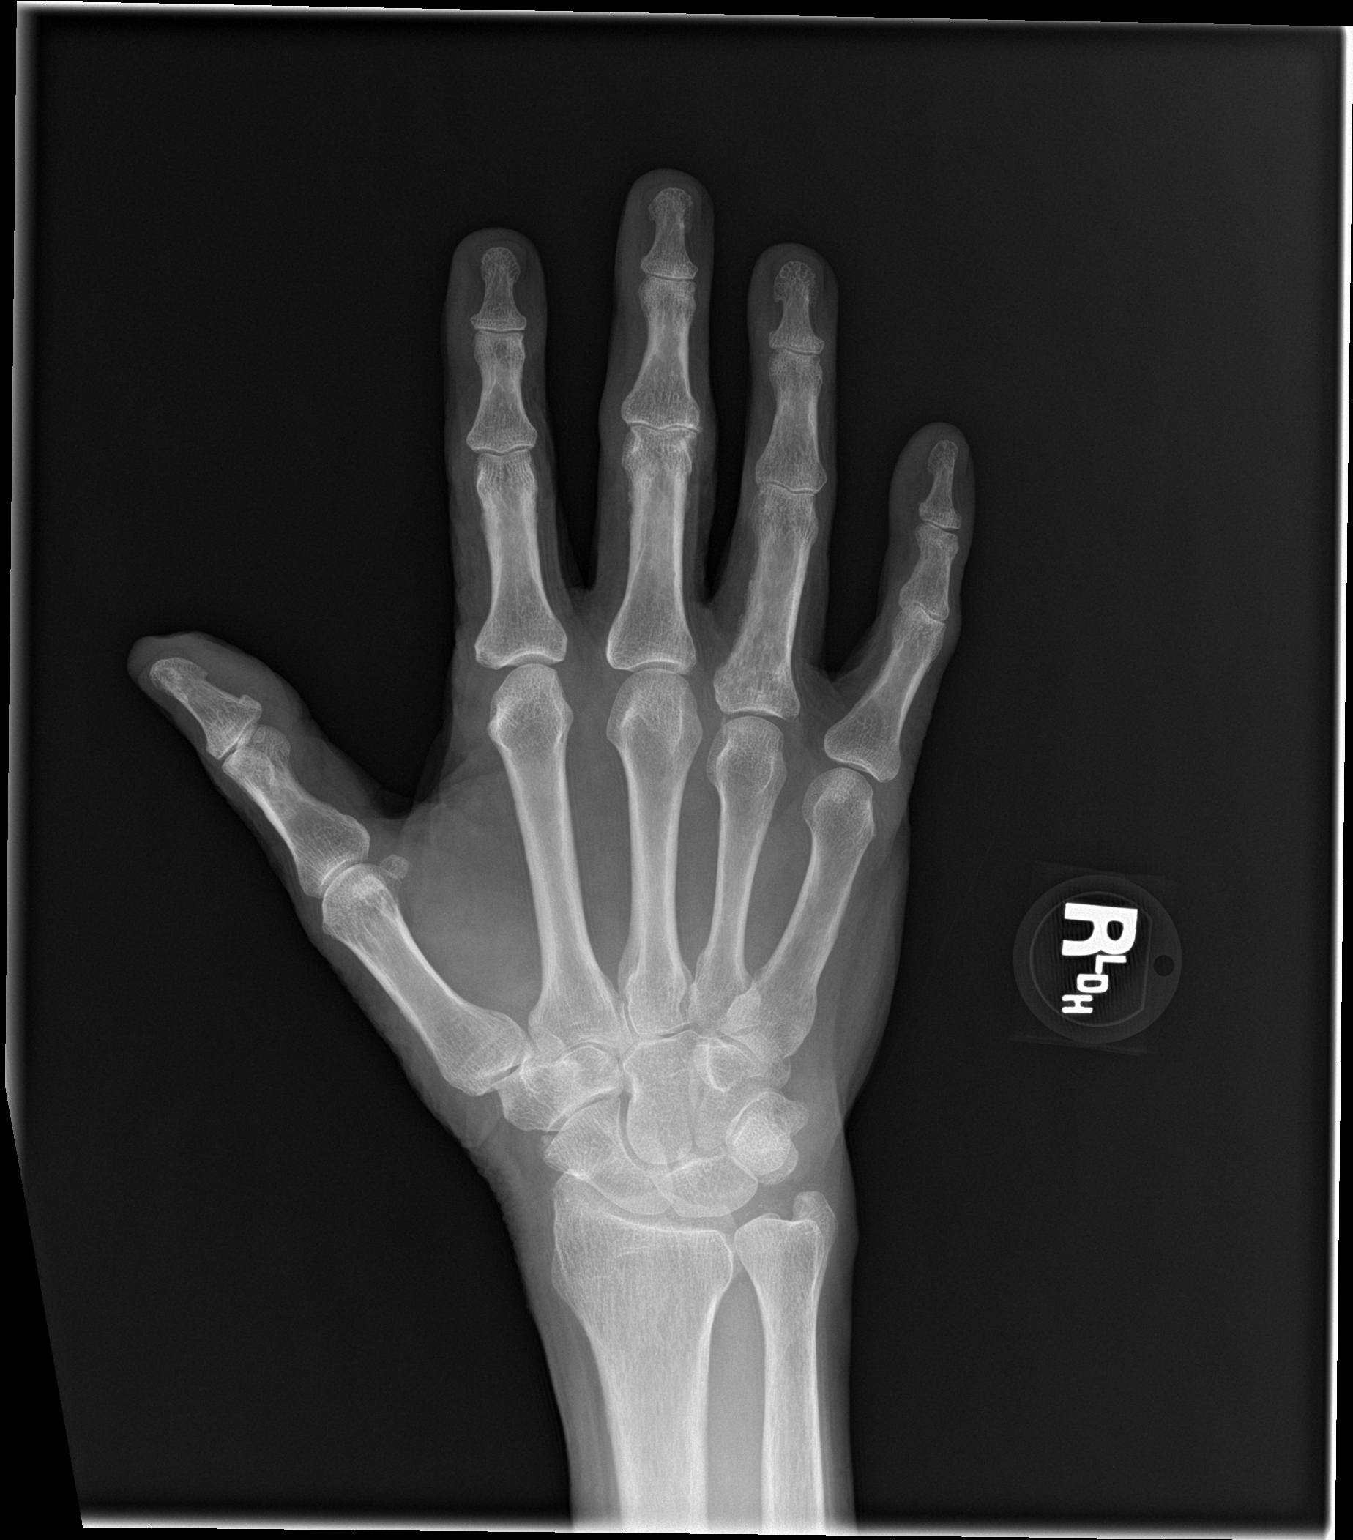

[hand obl]
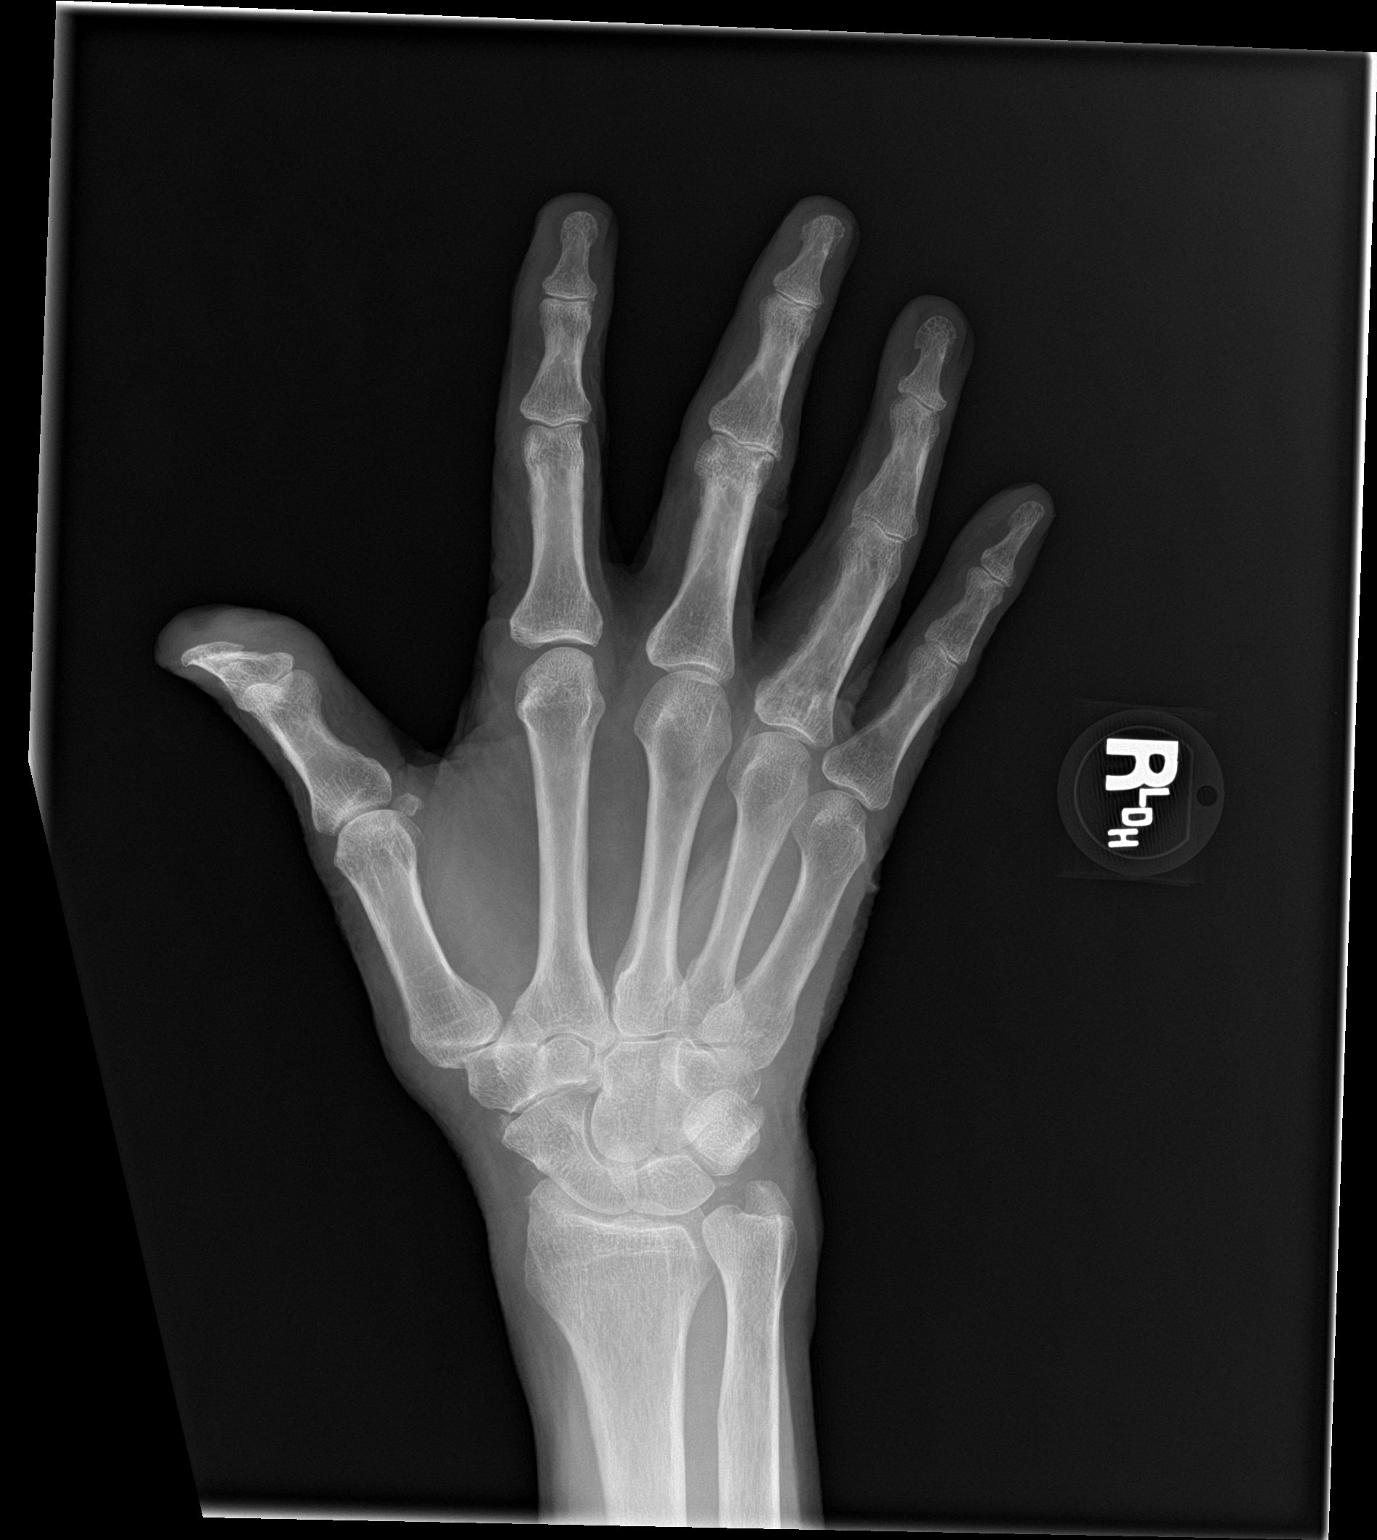

[hand lat]
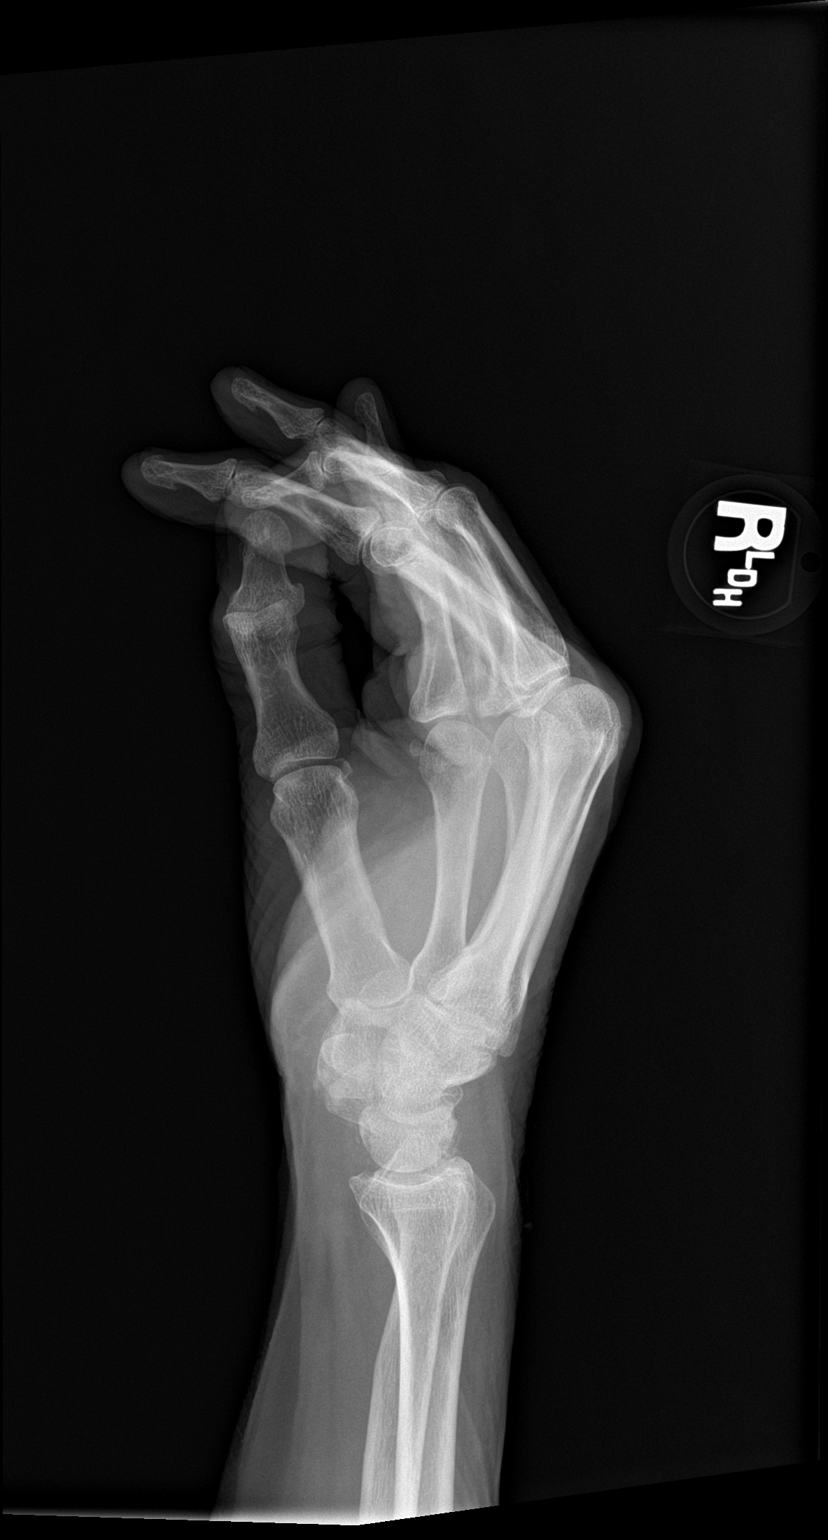

[3 of 3 positions shown; findings below may reference images not displayed]

FINDINGS: Frontal, oblique, and lateral views of the right hand are obtained.
There are no acute displaced fractures. Joint spaces are relatively
well preserved. Alignment is anatomic. Soft tissues are normal.
IMPRESSION: 1. Unremarkable right hand.

## 2020-10-27 IMAGING — DX DG HUMERUS 2V *R*
2 series · 2 of 2 positions shown · non-contrast
Comparison: None.

CLINICAL DATA: Fell down 4 stairs, pain at

EXAM:
RIGHT HUMERUS - 2+ VIEW

[humerus ap]
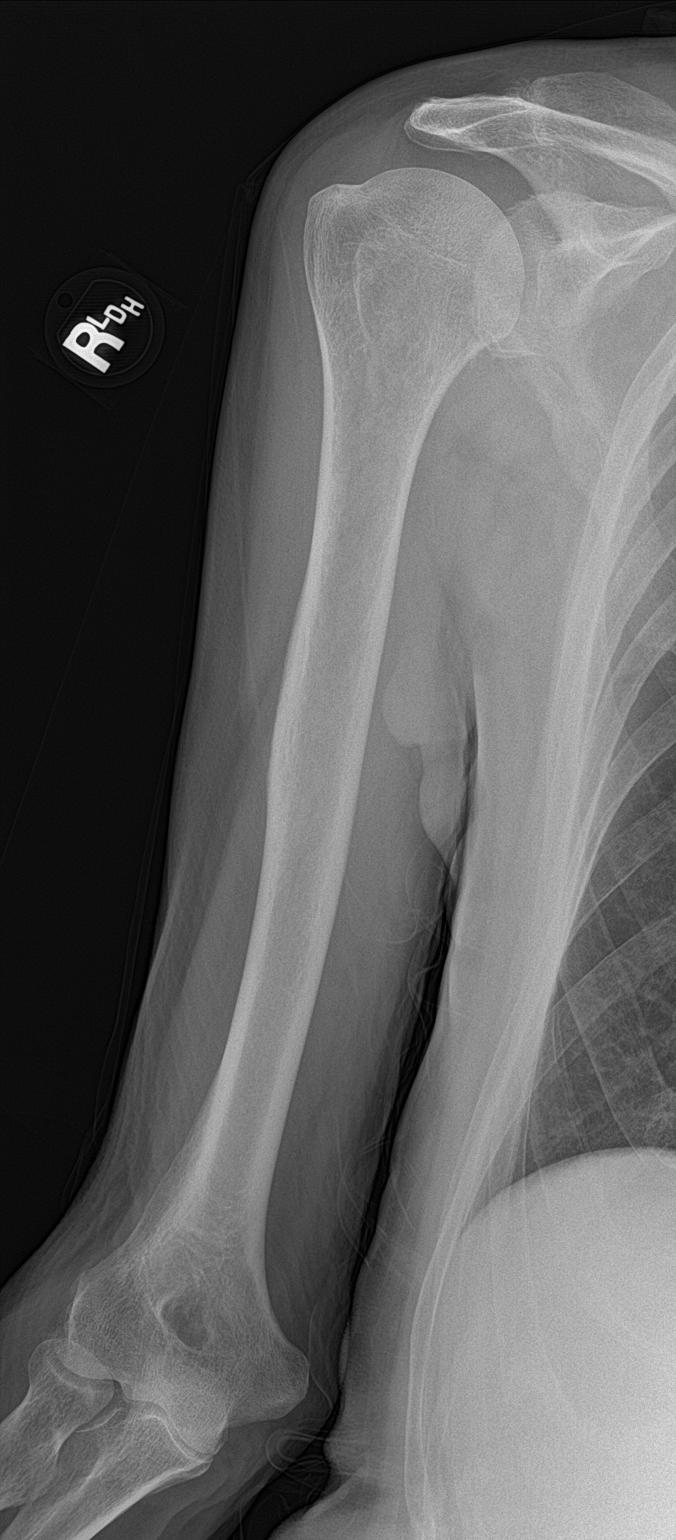

[humerus lat]
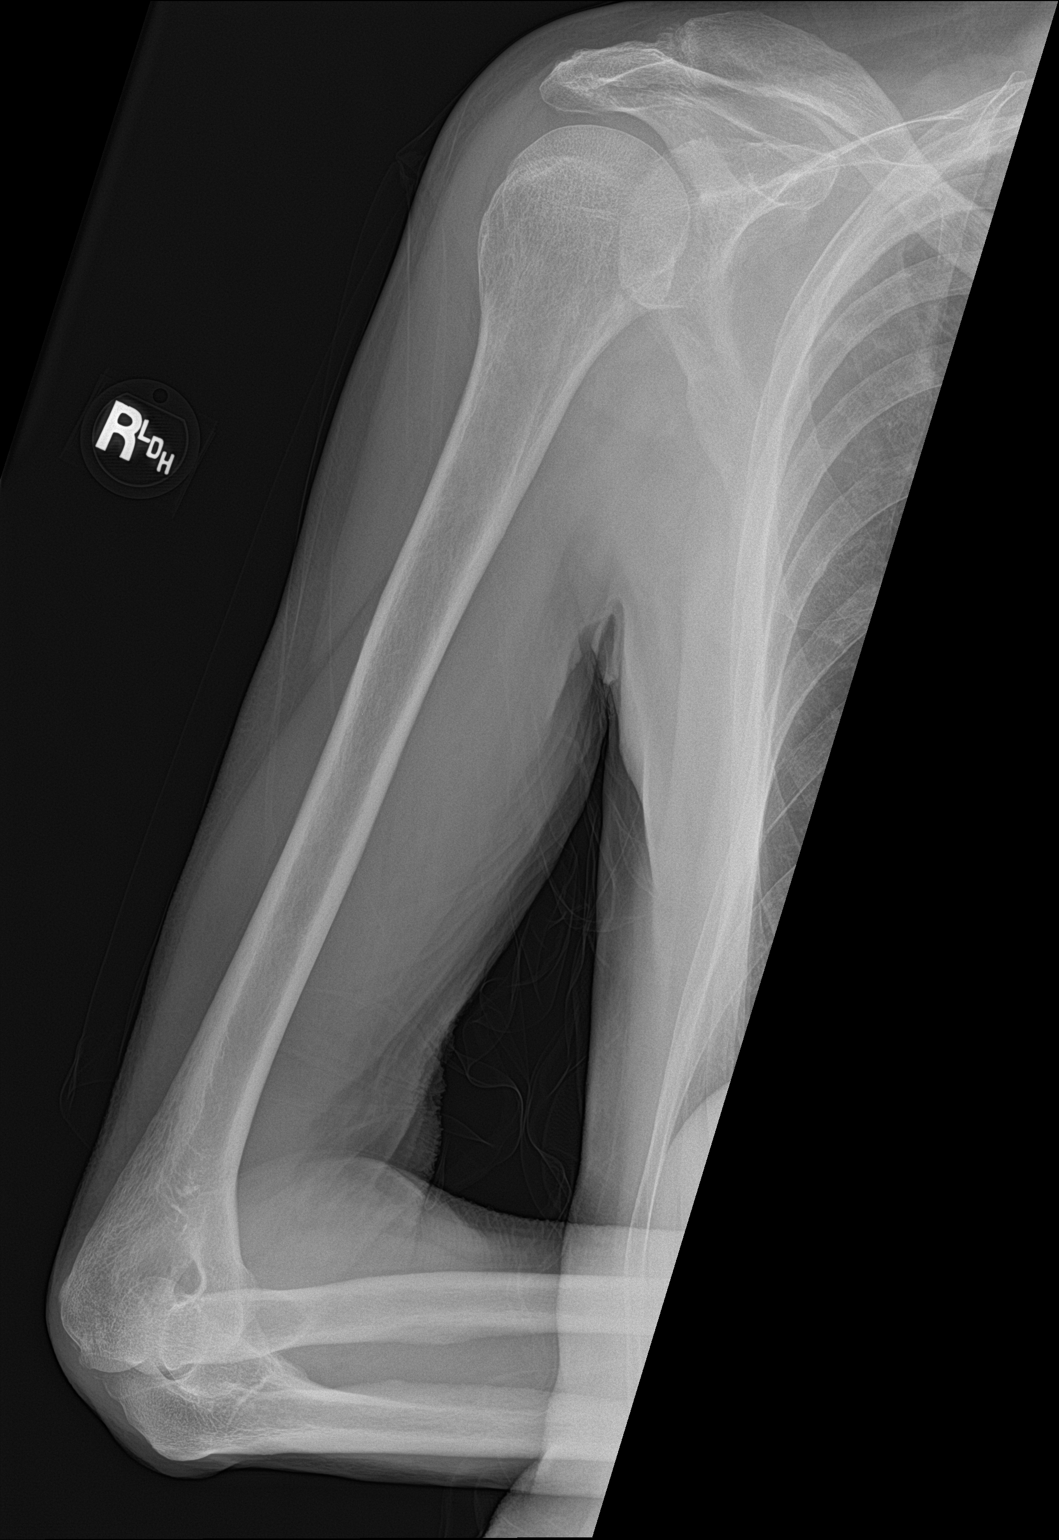

[2 of 2 positions shown; findings below may reference images not displayed]

FINDINGS: Frontal and lateral views of the right humerus demonstrate no
fractures. Alignment of the right shoulder and elbow is anatomic.
Soft tissues are normal.
IMPRESSION: 1. Unremarkable right humerus.
